# Patient Record
Sex: Male | Born: 1945 | Race: White | Hispanic: No | Smoking: Never smoker
Health system: Southern US, Community
[De-identification: ages and names within clinical notes are randomized; demographics above are authoritative.]

## PROBLEM LIST (undated history)

## (undated) DIAGNOSIS — K5790 Diverticulosis of intestine, part unspecified, without perforation or abscess without bleeding: Secondary | ICD-10-CM

## (undated) DIAGNOSIS — K922 Gastrointestinal hemorrhage, unspecified: Secondary | ICD-10-CM

## (undated) DIAGNOSIS — F329 Major depressive disorder, single episode, unspecified: Secondary | ICD-10-CM

## (undated) DIAGNOSIS — F32A Depression, unspecified: Secondary | ICD-10-CM

## (undated) DIAGNOSIS — J069 Acute upper respiratory infection, unspecified: Secondary | ICD-10-CM

## (undated) DIAGNOSIS — I10 Essential (primary) hypertension: Secondary | ICD-10-CM

## (undated) DIAGNOSIS — M1990 Unspecified osteoarthritis, unspecified site: Secondary | ICD-10-CM

## (undated) DIAGNOSIS — I499 Cardiac arrhythmia, unspecified: Secondary | ICD-10-CM

## (undated) HISTORY — PX: TONSILLECTOMY: SUR1361

---

## 1990-01-01 HISTORY — PX: TREATMENT FISTULA ANAL: SUR1390

## 1990-01-01 HISTORY — PX: SINUS EXPLORATION: SHX5214

## 2008-01-02 DIAGNOSIS — K922 Gastrointestinal hemorrhage, unspecified: Secondary | ICD-10-CM

## 2008-01-02 HISTORY — DX: Gastrointestinal hemorrhage, unspecified: K92.2

## 2009-04-16 ENCOUNTER — Inpatient Hospital Stay (HOSPITAL_COMMUNITY): Admission: EM | Admit: 2009-04-16 | Discharge: 2009-04-19 | Payer: Self-pay | Admitting: Emergency Medicine

## 2010-03-21 LAB — HEPATIC FUNCTION PANEL
ALT: 24 U/L (ref 0–53)
AST: 28 U/L (ref 0–37)
Albumin: 3.7 g/dL (ref 3.5–5.2)
Alkaline Phosphatase: 57 U/L (ref 39–117)
Total Protein: 6 g/dL (ref 6.0–8.3)

## 2010-03-21 LAB — BASIC METABOLIC PANEL
BUN: 21 mg/dL (ref 6–23)
Calcium: 8.9 mg/dL (ref 8.4–10.5)
Chloride: 110 mEq/L (ref 96–112)
Creatinine, Ser: 1.26 mg/dL (ref 0.4–1.5)
GFR calc Af Amer: >60 mL/min (ref >60)
GFR calc non Af Amer: 57 mL/min — ABNORMAL LOW (ref >60)
Glucose, Bld: 103 mg/dL — ABNORMAL HIGH (ref 70–99)
Potassium: 4.1 mEq/L (ref 3.5–5.1)
Potassium: 4.3 mEq/L (ref 3.5–5.1)
Sodium: 140 mEq/L (ref 135–145)
Sodium: 142 mEq/L (ref 135–145)

## 2010-03-21 LAB — DIFFERENTIAL
Basophils Absolute: 0 10*3/uL (ref 0.0–0.1)
Lymphocytes Relative: 24 % (ref 12–46)
Lymphs Abs: 1.7 10*3/uL (ref 0.7–4.0)
Neutro Abs: 4.7 10*3/uL (ref 1.7–7.7)
Neutrophils Relative %: 67 % (ref 43–77)

## 2010-03-21 LAB — POCT I-STAT, CHEM 8
Creatinine, Ser: 1.4 mg/dL (ref 0.4–1.5)
Glucose, Bld: 105 mg/dL — ABNORMAL HIGH (ref 70–99)
HCT: 44 % (ref 39.0–52.0)
Hemoglobin: 15 g/dL (ref 13.0–17.0)
Potassium: 4.1 mEq/L (ref 3.5–5.1)
Sodium: 142 mEq/L (ref 135–145)
TCO2: 25 mmol/L (ref 0–100)

## 2010-03-21 LAB — TYPE AND SCREEN
ABO/RH(D): O POS
Antibody Screen: NEGATIVE

## 2010-03-21 LAB — CBC
HCT: 30.6 % — ABNORMAL LOW (ref 39.0–52.0)
HCT: 33.2 % — ABNORMAL LOW (ref 39.0–52.0)
HCT: 40.8 % (ref 39.0–52.0)
Hemoglobin: 10.7 g/dL — ABNORMAL LOW (ref 13.0–17.0)
Hemoglobin: 10.9 g/dL — ABNORMAL LOW (ref 13.0–17.0)
Hemoglobin: 11.6 g/dL — ABNORMAL LOW (ref 13.0–17.0)
MCHC: 34.8 g/dL (ref 30.0–36.0)
MCHC: 35.1 g/dL (ref 30.0–36.0)
MCV: 94.8 fL (ref 78.0–100.0)
MCV: 94.9 fL (ref 78.0–100.0)
MCV: 95.5 fL (ref 78.0–100.0)
Platelets: 170 10*3/uL (ref 150–400)
RBC: 3.22 MIL/uL — ABNORMAL LOW (ref 4.22–5.81)
RBC: 3.27 MIL/uL — ABNORMAL LOW (ref 4.22–5.81)
RBC: 3.48 MIL/uL — ABNORMAL LOW (ref 4.22–5.81)
RDW: 13.2 % (ref 11.5–15.5)
RDW: 13.2 % (ref 11.5–15.5)
WBC: 4.5 10*3/uL (ref 4.0–10.5)
WBC: 5 10*3/uL (ref 4.0–10.5)
WBC: 7 10*3/uL (ref 4.0–10.5)

## 2010-03-21 LAB — HEMOGLOBIN AND HEMATOCRIT, BLOOD
HCT: 31.2 % — ABNORMAL LOW (ref 39.0–52.0)
Hemoglobin: 10.9 g/dL — ABNORMAL LOW (ref 13.0–17.0)
Hemoglobin: 13.5 g/dL (ref 13.0–17.0)

## 2010-03-21 LAB — APTT: aPTT: 27 seconds (ref 24–37)

## 2010-11-10 ENCOUNTER — Encounter (HOSPITAL_COMMUNITY): Payer: Self-pay

## 2010-11-10 ENCOUNTER — Encounter (HOSPITAL_COMMUNITY): Payer: Self-pay | Admitting: Respiratory Therapy

## 2010-11-10 ENCOUNTER — Encounter (HOSPITAL_COMMUNITY)
Admission: RE | Admit: 2010-11-10 | Discharge: 2010-11-10 | Disposition: A | Discharge status: Home / Self Care | Payer: Medicare Other | Source: Ambulatory Visit | Attending: Anesthesiology | Admitting: Anesthesiology

## 2010-11-10 ENCOUNTER — Encounter (HOSPITAL_COMMUNITY)
Admission: RE | Admit: 2010-11-10 | Discharge: 2010-11-10 | Disposition: A | Discharge status: Home / Self Care | Payer: Medicare Other | Source: Ambulatory Visit | Attending: Orthopaedic Surgery | Admitting: Orthopaedic Surgery

## 2010-11-10 DIAGNOSIS — Z01818 Encounter for other preprocedural examination: Secondary | ICD-10-CM | POA: Insufficient documentation

## 2010-11-10 DIAGNOSIS — Z01812 Encounter for preprocedural laboratory examination: Secondary | ICD-10-CM | POA: Insufficient documentation

## 2010-11-10 HISTORY — DX: Acute upper respiratory infection, unspecified: J06.9

## 2010-11-10 HISTORY — DX: Depression, unspecified: F32.A

## 2010-11-10 HISTORY — DX: Major depressive disorder, single episode, unspecified: F32.9

## 2010-11-10 HISTORY — DX: Gastrointestinal hemorrhage, unspecified: K92.2

## 2010-11-10 HISTORY — DX: Essential (primary) hypertension: I10

## 2010-11-10 HISTORY — DX: Unspecified osteoarthritis, unspecified site: M19.90

## 2010-11-10 LAB — CBC
HCT: 47.5 % (ref 39.0–52.0)
Hemoglobin: 15.7 g/dL (ref 13.0–17.0)
MCH: 30.6 pg (ref 26.0–34.0)
MCHC: 33.1 g/dL (ref 30.0–36.0)
RBC: 5.13 MIL/uL (ref 4.22–5.81)

## 2010-11-10 LAB — BASIC METABOLIC PANEL
BUN: 27 mg/dL — ABNORMAL HIGH (ref 6–23)
Chloride: 106 mEq/L (ref 96–112)
GFR calc Af Amer: 52 mL/min — ABNORMAL LOW (ref >90)
Glucose, Bld: 100 mg/dL — ABNORMAL HIGH (ref 70–99)
Potassium: 5.7 mEq/L — ABNORMAL HIGH (ref 3.5–5.1)
Sodium: 141 mEq/L (ref 135–145)

## 2010-11-10 LAB — TYPE AND SCREEN
ABO/RH(D): O POS
Antibody Screen: NEGATIVE

## 2010-11-10 LAB — PROTIME-INR: INR: 1.03 (ref 0.00–1.49)

## 2010-11-10 NOTE — Progress Notes (Signed)
ekg requested from Dr. Mertha Finders office per pre-admit secretary.

## 2010-11-10 NOTE — Pre-Procedure Instructions (Signed)
[address]  11/10/2010   Your procedure is scheduled on:  11-21-2010  Tuesday  Report to [place] at 5:30 AM.  Call this number if you have problems the morning of surgery: [phone]   Remember:   Do not eat food:After Midnight.  Do not drink clear liquids: 4 Hours before arrival.  Take these medicines the morning of surgery with A SIP OF WATER: oxycodone if needed   Do not wear jewelry, make-up or nail polish.  Do not wear lotions, powders, or perfumes. You may wear deodorant.  Do not shave 48 hours prior to surgery.  Do not bring valuables to the hospital.  Contacts, dentures or bridgework may not be worn into surgery.  Leave suitcase in the car. After surgery it may be brought to your room.  For patients admitted to the hospital, checkout time is 11:00 AM the day of discharge.   Patients discharged the day of surgery will not be allowed to drive home.  Name and phone number of your driver: Harry Foster - brother-in-law [phone]  Special Instructions: [place] and bring it with you on the day of surgery. and CHG Shower Use Special Wash: 1/2 bottle night before surgery and 1/2 bottle morning of surgery.   Please read over the following fact sheets that you were given: Pain Booklet, Coughing and Deep Breathing, Blood Transfusion Information, MRSA Information and Surgical Site Infection Prevention

## 2010-11-16 NOTE — H&P (Signed)
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  He is here about both of his knees in referral through Dr. Marcellus Foster.  He has had years of trouble with his knees and has had many injections in both of them.  He has been on various oral anti-inflammatories and is currently on meloxicam.  He has pain which limits his ability to walk and stand and rest.  He is of the opinion that he needs a knee replacement.  His left side is more painful than the right.  He considered having this done in the past couple of years, but his wife was ill and he was the primary caregiver.  She has passed away earlier this year.  He describes his pain as constant and severe and thinks it is getting worse.  PAST MEDICAL HISTORY:  He is not diabetic and has no drug allergies.  He has no other orthopedic issues.  A 14 system review is performed and is negative except for a history of elevated cholesterol and some stomach issues.  His medications include DHEA, coenzyme Q, meloxicam, lisinopril, and p.r.n. oxycodone maybe 3 pills per day of 15 mg strength.  His medical coverage is through Dr. Marcellus Foster.  Review of systems reviewed thoroughly and all other systems are negative as related to the chief complaint.  FAMILY HISTORY:  Positive for diabetes and positive for arthritis.  SOCIAL HISTORY:  He is a widower and currently lives alone.  He does not smoke.  He is retired from running a Information systems manager.  PHYSICAL EXAM:  Harry Foster is a well-developed, well-nourished man in no apparent distress and oriented x3.  He walks with a bit of an altered gait.  He has motion on both sides from about 0 to 95 with no effusion on either side and no scars.  He has crepitation with medial joint line pain on both sides.  He has good hip motion and sensation and motor function are intact in his heel with palpable pulses on both sides.  No palpable  lymphadenopathy behind either knee.  He has normal extraocular motion equal and reactive pupils.  RADIOGRAPHS:  X-rays were ordered, performed, and interpreted by me today.  We took 4 views of both knees here today.  He has advanced degenerative change in the medial compartment on each side.  ASSESSMENT:  Bilateral end-stage DJD, left side more symptomatic.  PLAN:  Harry Foster is coming to the point where he needs to consider knee replacement surgery.  We have reviewed risks of anesthesia, infection, DVT, PE, and death related to a replacement, which we would likely tackle first on the left.  I have told him he would be in the hospital for about 3 days and then would probably need to go to rehab to stay somewhere as he lives alone currently.  I stressed the importance of the rehabilitation protocol to optimize result.  He would like to have this done some time in November after he gets through Harry Foster season and the grass stops growing.  We will try to accommodate him with surgery for some time in November.  I will write him a letter to go over today's first visit. RE: Harry, Foster

## 2010-11-20 NOTE — H&P (Signed)
H&P reviewed and patient examined.  No significant changes.  Acceptable for surgery.  Harry Foster 

## 2010-11-21 ENCOUNTER — Other Ambulatory Visit: Payer: Self-pay

## 2010-11-21 ENCOUNTER — Encounter (HOSPITAL_COMMUNITY)
Admission: RE | Disposition: A | Discharge status: Home / Self Care | Payer: Self-pay | Source: Ambulatory Visit | Attending: Orthopaedic Surgery

## 2010-11-21 ENCOUNTER — Ambulatory Visit (HOSPITAL_COMMUNITY)
Admission: RE | Admit: 2010-11-21 | Discharge: 2010-11-21 | Disposition: A | Discharge status: Home / Self Care | Payer: Medicare Other | Source: Ambulatory Visit | Attending: Orthopaedic Surgery | Admitting: Orthopaedic Surgery

## 2010-11-21 DIAGNOSIS — I4891 Unspecified atrial fibrillation: Secondary | ICD-10-CM | POA: Insufficient documentation

## 2010-11-21 DIAGNOSIS — M1710 Unilateral primary osteoarthritis, unspecified knee: Secondary | ICD-10-CM | POA: Insufficient documentation

## 2010-11-21 DIAGNOSIS — Z01810 Encounter for preprocedural cardiovascular examination: Secondary | ICD-10-CM | POA: Insufficient documentation

## 2010-11-21 DIAGNOSIS — Z5309 Procedure and treatment not carried out because of other contraindication: Secondary | ICD-10-CM | POA: Insufficient documentation

## 2010-11-21 DIAGNOSIS — I10 Essential (primary) hypertension: Secondary | ICD-10-CM | POA: Insufficient documentation

## 2010-11-21 SURGERY — ARTHROPLASTY, KNEE, TOTAL
Anesthesia: Regional | Laterality: Left

## 2010-11-21 MED ORDER — LACTATED RINGERS IV SOLN: INTRAVENOUS | Status: DC

## 2010-11-21 SURGICAL SUPPLY — 61 items
AUTOTRANSFUSION W/QD PVC DRAIN (AUTOTRANSFUSION) ×2 IMPLANT
BANDAGE ELASTIC 4 VELCRO ST LF (GAUZE/BANDAGES/DRESSINGS) ×2 IMPLANT
BANDAGE ESMARK 6X9 LF (GAUZE/BANDAGES/DRESSINGS) ×1 IMPLANT
BANDAGE GAUZE ELAST BULKY 4 IN (GAUZE/BANDAGES/DRESSINGS) ×4 IMPLANT
BLADE SAGITTAL 25.0X1.19X90 (BLADE) ×2 IMPLANT
BLADE SURG ROTATE 9660 (MISCELLANEOUS) IMPLANT
BNDG CMPR 9X6 STRL LF SNTH (GAUZE/BANDAGES/DRESSINGS) ×1
BNDG CMPR MED 10X6 ELC LF (GAUZE/BANDAGES/DRESSINGS) ×1
BNDG ELASTIC 6X10 VLCR STRL LF (GAUZE/BANDAGES/DRESSINGS) ×2 IMPLANT
BNDG ESMARK 6X9 LF (GAUZE/BANDAGES/DRESSINGS) ×2
BOWL SMART MIX CTS (DISPOSABLE) ×2 IMPLANT
CLOTH BEACON ORANGE TIMEOUT ST (SAFETY) ×2 IMPLANT
COVER SURGICAL LIGHT HANDLE (MISCELLANEOUS) ×2 IMPLANT
CUFF TOURNIQUET SINGLE 34IN LL (TOURNIQUET CUFF) ×2 IMPLANT
CUFF TOURNIQUET SINGLE 44IN (TOURNIQUET CUFF) IMPLANT
DRAPE EXTREMITY T 121X128X90 (DRAPE) ×2 IMPLANT
DRAPE PROXIMA HALF (DRAPES) ×2 IMPLANT
DRAPE U-SHAPE 47X51 STRL (DRAPES) ×2 IMPLANT
DRSG ADAPTIC 3X8 NADH LF (GAUZE/BANDAGES/DRESSINGS) ×2 IMPLANT
DRSG PAD ABDOMINAL 8X10 ST (GAUZE/BANDAGES/DRESSINGS) ×2 IMPLANT
DURAPREP 26ML APPLICATOR (WOUND CARE) ×2 IMPLANT
ELECT REM PT RETURN 9FT ADLT (ELECTROSURGICAL) ×2
ELECTRODE REM PT RTRN 9FT ADLT (ELECTROSURGICAL) ×1 IMPLANT
EVACUATOR 1/8 PVC DRAIN (DRAIN) ×2 IMPLANT
FACESHIELD LNG OPTICON STERILE (SAFETY) ×2 IMPLANT
GLOVE BIO SURGEON STRL SZ8.5 (GLOVE) ×2 IMPLANT
GLOVE BIOGEL PI IND STRL 8 (GLOVE) ×1 IMPLANT
GLOVE BIOGEL PI IND STRL 8.5 (GLOVE) ×1 IMPLANT
GLOVE BIOGEL PI INDICATOR 8 (GLOVE) ×1
GLOVE BIOGEL PI INDICATOR 8.5 (GLOVE) ×1
GLOVE SS BIOGEL STRL SZ 8 (GLOVE) ×1 IMPLANT
GLOVE SUPERSENSE BIOGEL SZ 8 (GLOVE) ×1
GOWN PREVENTION PLUS XLARGE (GOWN DISPOSABLE) ×6 IMPLANT
GOWN STRL NON-REIN LRG LVL3 (GOWN DISPOSABLE) ×4 IMPLANT
GOWN STRL REIN 2XL LVL4 (GOWN DISPOSABLE) ×2 IMPLANT
HANDPIECE INTERPULSE COAX TIP (DISPOSABLE) ×2
HOOD PEEL AWAY FACE SHEILD DIS (HOOD) ×2 IMPLANT
IMMOBILIZER KNEE 20 (SOFTGOODS)
IMMOBILIZER KNEE 20 THIGH 36 (SOFTGOODS) IMPLANT
IMMOBILIZER KNEE 22 UNIV (SOFTGOODS) ×2 IMPLANT
IMMOBILIZER KNEE 24 THIGH 36 (MISCELLANEOUS) IMPLANT
IMMOBILIZER KNEE 24 UNIV (MISCELLANEOUS)
KIT BASIN OR (CUSTOM PROCEDURE TRAY) ×2 IMPLANT
KIT ROOM TURNOVER OR (KITS) ×2 IMPLANT
MANIFOLD NEPTUNE II (INSTRUMENTS) ×2 IMPLANT
NS IRRIG 1000ML POUR BTL (IV SOLUTION) ×2 IMPLANT
PACK TOTAL JOINT (CUSTOM PROCEDURE TRAY) ×2 IMPLANT
PAD ARMBOARD 7.5X6 YLW CONV (MISCELLANEOUS) ×2 IMPLANT
SET HNDPC FAN SPRY TIP SCT (DISPOSABLE) ×1 IMPLANT
SPONGE GAUZE 4X4 12PLY (GAUZE/BANDAGES/DRESSINGS) ×2 IMPLANT
STAPLER VISISTAT 35W (STAPLE) ×2 IMPLANT
SUCTION FRAZIER TIP 10 FR DISP (SUCTIONS) ×2 IMPLANT
SUT VIC AB 0 CT1 27 (SUTURE) ×4
SUT VIC AB 0 CT1 27XBRD ANBCTR (SUTURE) ×2 IMPLANT
SUT VIC AB 1 CTB1 27 (SUTURE) ×4 IMPLANT
SUT VIC AB 2-0 CT1 27 (SUTURE) ×4
SUT VIC AB 2-0 CT1 TAPERPNT 27 (SUTURE) ×2 IMPLANT
TOWEL OR 17X24 6PK STRL BLUE (TOWEL DISPOSABLE) ×2 IMPLANT
TOWEL OR 17X26 10 PK STRL BLUE (TOWEL DISPOSABLE) ×2 IMPLANT
TRAY FOLEY CATH 14FR (SET/KITS/TRAYS/PACK) ×2 IMPLANT
WATER STERILE IRR 1000ML POUR (IV SOLUTION) ×6 IMPLANT

## 2010-11-21 NOTE — Progress Notes (Signed)
Patient noted to be in afib in preop.  Needs preop clearance for this new onset afib.  Family knows Dr Daneen Schick and will have him seen soon.  Will reschedule once that is done.

## 2010-11-21 NOTE — Progress Notes (Signed)
EKG from this am shows atrial fibrilation.  Pt denies history of heart disease other than hypertension.  Does not have a cardiologist.  Old EKG from 6/11 shows sinus rhythm. Dr Albertina Parr notified and he states this needs to be run by Dr Rhona Raider.  Dr Rhona Raider and his PA paged at [phone].

## 2010-11-22 ENCOUNTER — Encounter (HOSPITAL_COMMUNITY): Payer: Self-pay | Admitting: Orthopaedic Surgery

## 2010-11-28 NOTE — Discharge Summary (Signed)
Patient ID: Harry Foster MRN: [id number] DOB/AGE: 65-Oct-1947 65 y.o.  Admit date: 11/21/2010 Discharge date: 11/21/10  Admission Diagnoses:  Active Problems:  Atrial fibrillation   Discharge Diagnoses:  Same  Past Medical History  Diagnosis Date  . GI bleed 2010  . Hypertension   . Recurrent upper respiratory infection (URI)     completing treatment for sinus infection - with abx  . Arthritis     both knees and hands and left elbow  . Depression     treated for depression when wife was very ill    Surgeries: Procedure(s):   Consultants:    Discharged Condition: Improved  Hospital Course: Harry Foster is an 65 y.o. male who was not admitted due to finding of atrial fib. Surgery put off till cardiac eval..  TOTAL KNEE ARTHROPLASTY cancelled.   Patient was given perioperative antibiotics:  Anti-infectives    None       Patient was given sequential compression devices, early ambulation, and chemoprophylaxis to prevent DVT.  Patient benefited maximally from hospital stay and there were no complications.    Recent vital signs: No data found.    Recent laboratory studies: No results found for this basename: WBC:2,HGB:2,HCT:2,PLT:2,NA:2,K:2,CL:2,CO2:2,BUN:2,CREATININE:2,GLUCOSE:2,PT:2,INR:2,CALCIUM,2: in the last 72 hours   Discharge Medications:   Discharge Medication List as of 11/21/2010  9:35 AM    CONTINUE these medications which have NOT CHANGED   Details  amLODipine (NORVASC) 5 MG tablet Take 5 mg by mouth daily.  , Until Discontinued, Historical Med    ascorbic acid (VITAMIN C) 1000 MG tablet Take 1,000 mg by mouth daily.  , Until Discontinued, Historical Med    B Complex-C (B-COMPLEX WITH VITAMIN C) tablet Take 1 tablet by mouth daily.  , Until Discontinued, Historical Med    cholecalciferol (VITAMIN D) 1000 UNITS tablet Take 5,000 Units by mouth daily.  , Until Discontinued, Historical Med    Coenzyme Q10 (CO Q 10 PO) Take 1 tablet by mouth daily.   , Until Discontinued, Historical Med    DHEA 50 MG TABS Take 1 tablet by mouth daily.  , Until Discontinued, Historical Med    Lactobacillus (ACIDOPHILUS) CAPS Take 1 capsule by mouth daily.  , Until Discontinued, Historical Med    lisinopril-hydrochlorothiazide (PRINZIDE,ZESTORETIC) 10-12.5 MG per tablet Take 1 tablet by mouth daily.  , Until Discontinued, Historical Med    Magnesium (MAGNACAPS PO) Take 1 capsule by mouth daily.  , Until Discontinued, Historical Med    meloxicam (MOBIC) 15 MG tablet Take 15 mg by mouth daily.  , Until Discontinued, Historical Med    Multiple Vitamins-Minerals (MULTIVITAMINS THER. W/MINERALS) TABS Take 2 tablets by mouth daily. , Until Discontinued, Historical Med    OLIVE LEAF EXTRACT PO Take 1 tablet by mouth daily.  , Until Discontinued, Historical Med    omega-3 acid ethyl esters (LOVAZA) 1 G capsule Take 2 g by mouth 2 (two) times daily.  , Until Discontinued, Historical Med    !! OVER THE COUNTER MEDICATION Take 1 capsule by mouth daily. Berry extract , Until Discontinued, Historical Med    !! OVER THE COUNTER MEDICATION Take 1 capsule by mouth daily. Vitamin d 5000 units w/ iodine 1013mcg , Until Discontinued, Historical Med    oxyCODONE (ROXICODONE) 15 MG immediate release tablet Take 15 mg by mouth every 8 (eight) hours as needed. For pain , Until Discontinued, Historical Med    Pomegranate, [place] granatum, (POMEGRANATE PO) Take 1 tablet by mouth daily.  , Until  Discontinued, Historical Med    Probiotic Product (PROBIOTIC PO) Take 1 tablet by mouth daily.  , Until Discontinued, Historical Med    Saw Palmetto, Serenoa repens, (SAW PALMETTO PO) Take 2 tablets by mouth daily. , Until Discontinued, Historical Med    triamcinolone (NASACORT) 55 MCG/ACT nasal inhaler Place 1 spray into the nose daily.  , Until Discontinued, Historical Med    VITAMIN E PO Take 1 capsule by mouth daily.  , Until Discontinued, Historical Med    VITAMIN K,  PHYTONADIONE, PO Take 1 tablet by mouth daily.  , Until Discontinued, Historical Med     !! - Potential duplicate medications found. Please discuss with provider.      Diagnostic Studies: Dg Chest 2 View  11/10/2010  *RADIOLOGY REPORT*  Clinical Data: Preop left total knee arthroplasty  CHEST - 2 VIEW  Comparison: None.  Findings: Lungs are essentially clear. No pleural effusion or pneumothorax.  The heart is top normal in size.  Degenerative changes of the visualized thoracolumbar spine.  Old left rib fracture deformities.  IMPRESSION: No evidence of acute cardiopulmonary disease.  Original Report Authenticated By: Deskin Hy, M.D.    Disposition: Home or Self Care  Pt did not have surgery and was sent home due to atrial fib.  He will see a cardiologist soon  Discharge Orders    Future Orders Please Complete By Expires   Increase activity slowly      Discharge instructions      Comments:   Need cardiology appointment with Dr Tamala Repinski         Signed: Roselee Nova R 11/28/2010, 12:49 PM

## 2011-01-19 ENCOUNTER — Encounter (HOSPITAL_COMMUNITY): Payer: Self-pay | Admitting: Pharmacy Technician

## 2011-01-30 ENCOUNTER — Other Ambulatory Visit: Payer: Self-pay | Admitting: Interventional Cardiology

## 2011-01-30 DIAGNOSIS — I4819 Other persistent atrial fibrillation: Secondary | ICD-10-CM

## 2011-01-31 ENCOUNTER — Other Ambulatory Visit: Payer: Self-pay | Admitting: Interventional Cardiology

## 2011-01-31 DIAGNOSIS — I4819 Other persistent atrial fibrillation: Secondary | ICD-10-CM | POA: Insufficient documentation

## 2011-01-31 NOTE — Progress Notes (Signed)
Addended by: Daneen Schick III on: 01/31/2011 01:03 PM   Modules accepted: Orders

## 2011-02-01 ENCOUNTER — Other Ambulatory Visit: Payer: Self-pay

## 2011-02-01 ENCOUNTER — Ambulatory Visit (HOSPITAL_COMMUNITY): Payer: Medicare Other | Admitting: Anesthesiology

## 2011-02-01 ENCOUNTER — Encounter (HOSPITAL_COMMUNITY)
Admission: RE | Disposition: A | Discharge status: Home / Self Care | Payer: Self-pay | Source: Ambulatory Visit | Attending: Interventional Cardiology

## 2011-02-01 ENCOUNTER — Encounter (HOSPITAL_COMMUNITY): Payer: Self-pay | Admitting: Anesthesiology

## 2011-02-01 ENCOUNTER — Ambulatory Visit (HOSPITAL_COMMUNITY)
Admission: RE | Admit: 2011-02-01 | Discharge: 2011-02-01 | Disposition: A | Discharge status: Home / Self Care | Payer: Medicare Other | Source: Ambulatory Visit | Attending: Interventional Cardiology | Admitting: Interventional Cardiology

## 2011-02-01 DIAGNOSIS — I4891 Unspecified atrial fibrillation: Secondary | ICD-10-CM | POA: Insufficient documentation

## 2011-02-01 HISTORY — PX: CARDIOVERSION: SHX1299

## 2011-02-01 SURGERY — CARDIOVERSION
Anesthesia: General | Wound class: Clean

## 2011-02-01 MED ORDER — HYDROCORTISONE 1 % EX CREA
1.0000 "application " | TOPICAL_CREAM | Freq: Three times a day (TID) | CUTANEOUS | Status: DC | PRN
  Filled 2011-02-01 (×2): qty 28

## 2011-02-01 MED ORDER — SODIUM CHLORIDE 0.9 % IV SOLN
250.0000 mL | INTRAVENOUS | Status: DC
  Administered 2011-02-01: 1000 mL via INTRAVENOUS

## 2011-02-01 MED ORDER — PROPOFOL 10 MG/ML IV EMUL
INTRAVENOUS | Status: DC | PRN
  Administered 2011-02-01: 50 mg via INTRAVENOUS

## 2011-02-01 MED ORDER — SODIUM CHLORIDE 0.9 % IJ SOLN: 3.0000 mL | Freq: Two times a day (BID) | INTRAMUSCULAR | Status: DC

## 2011-02-01 MED ORDER — SODIUM CHLORIDE 0.9 % IJ SOLN: 3.0000 mL | INTRAMUSCULAR | Status: DC | PRN

## 2011-02-01 NOTE — Op Note (Signed)
Electrical Cardioversion Procedure Note Harry Foster [id number] 1945-04-22  Procedure: Electrical Cardioversion Indications:  Atrial Fibrillation with decreased LVEF due to tachycardia.  Time Out: Verified patient identification, verified procedure,medications/allergies/relevent history reviewed, required imaging and test results available.  Performed  Procedure Details  The patient was NPO after midnight. Anesthesia was administered at the beside  by Dr.Hodierne with 80mg  of propofol.  Cardioversion was done with synchronized biphasic defibrillation with AP pads with 200 watts.  The patient converted to normal sinus rhythm. The patient tolerated the procedure well   IMPRESSION:  Successful cardioversion of atrial fibrillation    Harry Foster 02/01/2011, 12:14 PM

## 2011-02-01 NOTE — H&P (Signed)
We re-discussed the treatment plan and indication. We re-discussed the procedure. He again gave informed consent. No change in status since OV 01/30/11.

## 2011-02-01 NOTE — Anesthesia Postprocedure Evaluation (Signed)
  Anesthesia Post-op Note  Patient: Harry Foster  Procedure(s) Performed:  CARDIOVERSION  Patient Location: PACU  Anesthesia Type: MAC  Level of Consciousness: awake, alert  and oriented  Airway and Oxygen Therapy: Patient Spontanous Breathing  Post-op Pain: none  Post-op Assessment: Post-op Vital signs reviewed, Patient's Cardiovascular Status Stable, Respiratory Function Stable, Patent Airway, No signs of Nausea or vomiting and Adequate PO intake  Post-op Vital Signs: Reviewed and stable  Complications: No apparent anesthesia complications

## 2011-02-01 NOTE — Anesthesia Preprocedure Evaluation (Addendum)
Anesthesia Evaluation  Patient identified by MRN, date of birth, ID band Patient awake    Reviewed: Allergy & Precautions, H&P , NPO status , Patient's Chart, lab work & pertinent test results, reviewed documented beta blocker date and time   History of Anesthesia Complications Negative for: history of anesthetic complications  Airway       Dental   Pulmonary shortness of breath,          Cardiovascular hypertension, Pt. on home beta blockers + dysrhythmias Atrial Fibrillation     Neuro/Psych Depression    GI/Hepatic negative GI ROS, Neg liver ROS,   Endo/Other  Morbid obesity  Renal/GU negative Renal ROS  Genitourinary negative   Musculoskeletal negative musculoskeletal ROS (+)   Abdominal   Peds  Hematology negative hematology ROS (+)   Anesthesia Other Findings   Reproductive/Obstetrics negative OB ROS                           Anesthesia Physical Anesthesia Plan  ASA: III  Anesthesia Plan:    Post-op Pain Management:    Induction:   Airway Management Planned:   Additional Equipment:   Intra-op Plan:   Post-operative Plan:   Informed Consent:   Plan Discussed with:   Anesthesia Plan Comments:         Anesthesia Quick Evaluation

## 2011-02-01 NOTE — Preoperative (Signed)
Beta Blockers   Reason not to administer Beta Blockers:Not Applicable 

## 2011-02-01 NOTE — Transfer of Care (Signed)
Immediate Anesthesia Transfer of Care Note  Patient: Harry Foster  Procedure(s) Performed:  CARDIOVERSION  Patient Location: PACU  Anesthesia Type: MAC  Level of Consciousness: awake, alert  and oriented  Airway & Oxygen Therapy: Patient Spontanous Breathing and Patient connected to nasal cannula oxygen  Post-op Assessment: Report given to PACU RN, Post -op Vital signs reviewed and stable and Patient moving all extremities  Post vital signs: Reviewed and stable  Complications: No apparent anesthesia complications

## 2011-02-02 ENCOUNTER — Encounter (HOSPITAL_COMMUNITY): Payer: Self-pay | Admitting: Interventional Cardiology

## 2011-09-20 ENCOUNTER — Other Ambulatory Visit: Payer: Self-pay | Admitting: Orthopaedic Surgery

## 2011-09-24 ENCOUNTER — Encounter (HOSPITAL_COMMUNITY): Payer: Self-pay | Admitting: Pharmacy Technician

## 2011-10-03 ENCOUNTER — Encounter (HOSPITAL_COMMUNITY)
Admission: RE | Admit: 2011-10-03 | Discharge: 2011-10-03 | Disposition: A | Discharge status: Home / Self Care | Payer: Medicare Other | Source: Ambulatory Visit | Attending: Orthopaedic Surgery | Admitting: Orthopaedic Surgery

## 2011-10-03 ENCOUNTER — Encounter (HOSPITAL_COMMUNITY): Payer: Self-pay

## 2011-10-03 HISTORY — DX: Diverticulosis of intestine, part unspecified, without perforation or abscess without bleeding: K57.90

## 2011-10-03 HISTORY — DX: Cardiac arrhythmia, unspecified: I49.9

## 2011-10-03 LAB — PROTIME-INR: INR: 2.05 — ABNORMAL HIGH (ref 0.00–1.49)

## 2011-10-03 LAB — CBC WITH DIFFERENTIAL/PLATELET
Basophils Relative: 1 % (ref 0–1)
HCT: 45.2 % (ref 39.0–52.0)
Hemoglobin: 14.4 g/dL (ref 13.0–17.0)
Lymphocytes Relative: 34 % (ref 12–46)
Lymphs Abs: 1.8 10*3/uL (ref 0.7–4.0)
MCHC: 31.9 g/dL (ref 30.0–36.0)
Monocytes Relative: 7 % (ref 3–12)
Neutro Abs: 3 10*3/uL (ref 1.7–7.7)
Neutrophils Relative %: 56 % (ref 43–77)
RBC: 4.82 MIL/uL (ref 4.22–5.81)
WBC: 5.4 10*3/uL (ref 4.0–10.5)

## 2011-10-03 LAB — BASIC METABOLIC PANEL
Chloride: 105 mEq/L (ref 96–112)
GFR calc Af Amer: 51 mL/min — ABNORMAL LOW (ref >90)
GFR calc non Af Amer: 44 mL/min — ABNORMAL LOW (ref >90)
Potassium: 4.5 mEq/L (ref 3.5–5.1)
Sodium: 140 mEq/L (ref 135–145)

## 2011-10-03 LAB — SURGICAL PCR SCREEN: MRSA, PCR: NEGATIVE

## 2011-10-03 LAB — URINALYSIS, ROUTINE W REFLEX MICROSCOPIC
Ketones, ur: NEGATIVE mg/dL
Leukocytes, UA: NEGATIVE
Nitrite: NEGATIVE
Protein, ur: NEGATIVE mg/dL
pH: 5 (ref 5.0–8.0)

## 2011-10-03 LAB — APTT: aPTT: 35 seconds (ref 24–37)

## 2011-10-03 NOTE — Progress Notes (Addendum)
Requested cardiac records from Dr. Linard Millers at [facility]. Pt says he had cardiac clearance which was faxed to Dr. Jerald Kief office.   Pt requests T&S DOS, unwilling to wear armband for entire week prior to surgery.   Chart left for anesthesia review of labs, cardiac records.

## 2011-10-03 NOTE — Pre-Procedure Instructions (Signed)
[address]  10/03/2011   Your procedure is scheduled on:  Tuesday October 8  Report to [place] at 5:30 AM.  Call this number if you have problems the morning of surgery: [phone]   Remember:   Do not eat or drink:After Midnight.    Take these medicines the morning of surgery with A SIP OF WATER: Amiodarone (Pacerone), metoprolol (Toprol). Oxycodone (Roxicodone) if needed.    Do not wear jewelry, make-up or nail polish.  Do not wear lotions, powders, or perfumes. You may wear deodorant.  Do not shave 48 hours prior to surgery. Men may shave face and neck.  Do not bring valuables to the hospital.  Contacts, dentures or bridgework may not be worn into surgery.  Leave suitcase in the car. After surgery it may be brought to your room.  For patients admitted to the hospital, checkout time is 11:00 AM the day of discharge.   Patients discharged the day of surgery will not be allowed to drive home.  Name and phone number of your driver: NA  Special Instructions: Incentive Spirometry - Practice and bring it with you on the day of surgery. Shower using CHG 2 nights before surgery and the night before surgery.  If you shower the day of surgery use CHG.  Use special wash - you have one bottle of CHG for all showers.  You should use approximately 1/3 of the bottle for each shower.   Please read over the following fact sheets that you were given: Pain Booklet, Coughing and Deep Breathing, Blood Transfusion Information, Total Joint Packet and Surgical Site Infection Prevention

## 2011-10-04 NOTE — Consult Note (Signed)
Anesthesia chart review: Patient is a 66 year old male scheduled for left knee arthroplasty by Dr. Asa Saunas on 10/09/2011.  He was initially scheduled for 02/01/11, but was cancelled due to new onset a-fib.  History includes a-fib s/p cardioversion 02/01/11, obesity, non-smoker, depression, HTN, GI bleed '10, CKD, diverticulosis, anal fistula.  PCP is Dr. Josetta Huddle.    Cardiologist is Dr. Daneen Schick [facility]).  He saw patient for a pre-operative evaluation on 08/27/11 and cleared him for this procedure.  EKG at [facility] on 02/07/11 showed SB @ 44 bpm.  Echo on 11/27/10 [facility]) showed mildly reduced LV function with EF 40-45%, moderate LVH, mild left atrial enlargement, moderate tricuspid regurgitation, mildly elevated estimated RV systolic pressure, mild mitral valve regurgitation, diastolic function difficult to evaluate in light of underlying atrial fibrillation, borderline right atrial enlargement.  CXR on 11/10/10 showed no evidence of acute cardiopulmonary disease.  Labs noted.  BUN/Cr 24/1.59 (stable since 11/10/10).  He will need a repeat PT/INR and T&S on arrival.  Myra Gianotti, PA-C

## 2011-10-05 NOTE — H&P (Signed)
Harry Foster is an 66 y.o. male.   Chief Complaint: left knee pain HPI: Harry Foster is a patient well-known to our practice who is complaining of increasing left knee pain with activities.  At this point he is having constant severe pain which limits his activity level.  He is also having trouble sleeping at nighttime.  In the past she had been on anti-inflammatory medicines, he has done home physical therapy, and has tried injection with minimal to no benefit.he is currently on oxycodone to control his pain.  We had tried to get his knee replaced sometime ago but he went into atrial fibrillation so this had to be delayed until now when he is stable.  X-rays: Left knee multiple views AP lateral and tunnel show significant joint space narrowing and osteophyte formation.  We have discussed proceeding with a left knee replacement to improve function and decrease pain for this patient  Past Medical History  Diagnosis Date  . GI bleed 2010  . Hypertension   . Recurrent upper respiratory infection (URI)     completing treatment for sinus infection - with abx  . Arthritis     both knees and hands and left elbow  . Depression     treated for depression when wife was very ill  . GI bleed     hx  . Diverticulosis     hx - no sx  . Dysrhythmia     hx A fib, on anticoagulants    Past Surgical History  Procedure Date  . Sinus exploration 1992    laser sinus surgery  . Treatment fistula anal 1992  . Tonsillectomy     age 53  . No past surgeries 11-09-2010    tooth extracted by dentist under local anesthesia  . Total knee arthroplasty 11/21/2010    Procedure: TOTAL KNEE ARTHROPLASTY;  Surgeon: Hessie Dibble, MD;  Location: [place];  Service: Orthopedics;  Laterality: Left;  . Cardioversion 02/01/2011    Procedure: CARDIOVERSION;  Surgeon: Sinclair Grooms, MD;  Location: [place];  Service: Cardiovascular;  Laterality: N/A;    No family history on file. Social History:  reports that he has never  smoked. He does not have any smokeless tobacco history on file. He reports that he does not drink alcohol or use illicit drugs.  Allergies:  Allergies  Allergen Reactions  . Lisinopril Cough    No prescriptions prior to admission    Results for orders placed during the hospital encounter of 10/03/11 (from the past 48 hour(s))  SURGICAL PCR SCREEN     Status: Normal   Collection Time   10/03/11  2:23 PM      Component Value Range Comment   MRSA, PCR NEGATIVE  NEGATIVE    Staphylococcus aureus NEGATIVE  NEGATIVE   URINALYSIS, ROUTINE W REFLEX MICROSCOPIC     Status: Normal   Collection Time   10/03/11  2:23 PM      Component Value Range Comment   Color, Urine YELLOW  YELLOW    APPearance CLEAR  CLEAR    Specific Gravity, Urine 1.027  1.005 - 1.030    pH 5.0  5.0 - 8.0    Glucose, UA NEGATIVE  NEGATIVE mg/dL    Hgb urine dipstick NEGATIVE  NEGATIVE    Bilirubin Urine NEGATIVE  NEGATIVE    Ketones, ur NEGATIVE  NEGATIVE mg/dL    Protein, ur NEGATIVE  NEGATIVE mg/dL    Urobilinogen, UA 0.2  0.0 - 1.0  mg/dL    Nitrite NEGATIVE  NEGATIVE    Leukocytes, UA NEGATIVE  NEGATIVE MICROSCOPIC NOT DONE ON URINES WITH NEGATIVE PROTEIN, BLOOD, LEUKOCYTES, NITRITE, OR GLUCOSE <1000 mg/dL.  APTT     Status: Normal   Collection Time   10/03/11  2:24 PM      Component Value Range Comment   aPTT 35  24 - 37 seconds   BASIC METABOLIC PANEL     Status: Abnormal   Collection Time   10/03/11  2:24 PM      Component Value Range Comment   Sodium 140  135 - 145 mEq/L    Potassium 4.5  3.5 - 5.1 mEq/L    Chloride 105  96 - 112 mEq/L    CO2 26  19 - 32 mEq/L    Glucose, Bld 98  70 - 99 mg/dL    BUN 24 (*) 6 - 23 mg/dL    Creatinine, Ser 1.59 (*) 0.50 - 1.35 mg/dL    Calcium 9.7  8.4 - 10.5 mg/dL    GFR calc non Af Amer 44 (*) >90 mL/min    GFR calc Af Amer 51 (*) >90 mL/min   CBC WITH DIFFERENTIAL     Status: Normal   Collection Time   10/03/11  2:24 PM      Component Value Range Comment   WBC  5.4  4.0 - 10.5 K/uL    RBC 4.82  4.22 - 5.81 MIL/uL    Hemoglobin 14.4  13.0 - 17.0 g/dL    HCT 45.2  39.0 - 52.0 %    MCV 93.8  78.0 - 100.0 fL    MCH 29.9  26.0 - 34.0 pg    MCHC 31.9  30.0 - 36.0 g/dL    RDW 12.9  11.5 - 15.5 %    Platelets 182  150 - 400 K/uL    Neutrophils Relative 56  43 - 77 %    Neutro Abs 3.0  1.7 - 7.7 K/uL    Lymphocytes Relative 34  12 - 46 %    Lymphs Abs 1.8  0.7 - 4.0 K/uL    Monocytes Relative 7  3 - 12 %    Monocytes Absolute 0.4  0.1 - 1.0 K/uL    Eosinophils Relative 2  0 - 5 %    Eosinophils Absolute 0.1  0.0 - 0.7 K/uL    Basophils Relative 1  0 - 1 %    Basophils Absolute 0.0  0.0 - 0.1 K/uL   PROTIME-INR     Status: Abnormal   Collection Time   10/03/11  2:24 PM      Component Value Range Comment   Prothrombin Time 22.3 (*) 11.6 - 15.2 seconds    INR 2.05 (*) 0.00 - 1.49    No results found.  Review of Systems  Constitutional: Negative.   HENT: Negative.   Eyes: Negative.   Respiratory: Negative.   Cardiovascular: Positive for palpitations.  Gastrointestinal: Negative.   Genitourinary: Negative.   Musculoskeletal: Positive for joint pain.  Skin: Negative.   Neurological: Negative.   Endo/Heme/Allergies: Negative.   Psychiatric/Behavioral: Negative.     There were no vitals taken for this visit. Physical Exam  Constitutional: He appears well-nourished.  HENT:  Head: Atraumatic.  Eyes: Conjunctivae normal are normal.  Neck: Neck supple.  Cardiovascular: Regular rhythm.   Respiratory: Breath sounds normal.  GI: Bowel sounds are normal.  Musculoskeletal:       Left knee range of  motion 0-90 with no knee effusion.  He does have medial and lateral joint line pain to palpation.  There is also notable crepitation with range of motion.  Neurological: He has normal reflexes.  Skin: Skin is dry.  Psychiatric: He has a normal mood and affect.     Assessment/Plan Assessment: Left knee severe DJD Plan: We will proceed with a left  total knee replacement for area to decrease his pain and improve his function.  We have discussed the risks of anesthesia, infection, DVT and death associated with this type procedure.  Postoperatively we will use Lovenox and Coumadin for anti-coagulation.  Deshan Hemmelgarn R 10/05/2011, 12:51 PM

## 2011-10-08 MED ORDER — CEFAZOLIN SODIUM 10 G IJ SOLR
3.0000 g | INTRAMUSCULAR | Status: AC
  Administered 2011-10-09: 3 g via INTRAVENOUS
  Filled 2011-10-08: qty 3000

## 2011-10-08 MED ORDER — LACTATED RINGERS IV SOLN: INTRAVENOUS | Status: DC

## 2011-10-08 MED ORDER — CHLORHEXIDINE GLUCONATE 4 % EX LIQD: 60.0000 mL | Freq: Once | CUTANEOUS | Status: DC

## 2011-10-08 MED ORDER — CEFAZOLIN SODIUM-DEXTROSE 2-3 GM-% IV SOLR: 2.0000 g | INTRAVENOUS | Status: DC

## 2011-10-09 ENCOUNTER — Ambulatory Visit (HOSPITAL_COMMUNITY): Payer: Medicare Other | Admitting: Vascular Surgery

## 2011-10-09 ENCOUNTER — Encounter (HOSPITAL_COMMUNITY)
Admission: RE | Disposition: A | Discharge status: Skilled Nursing Facility | Payer: Self-pay | Source: Ambulatory Visit | Attending: Orthopaedic Surgery

## 2011-10-09 ENCOUNTER — Encounter (HOSPITAL_COMMUNITY): Payer: Self-pay | Admitting: Vascular Surgery

## 2011-10-09 ENCOUNTER — Inpatient Hospital Stay (HOSPITAL_COMMUNITY)
Admission: RE | Admit: 2011-10-09 | Discharge: 2011-10-11 | DRG: 470 | Disposition: A | Discharge status: Skilled Nursing Facility | Payer: Medicare Other | Source: Ambulatory Visit | Attending: Orthopaedic Surgery | Admitting: Orthopaedic Surgery

## 2011-10-09 ENCOUNTER — Encounter (HOSPITAL_COMMUNITY): Payer: Self-pay | Admitting: *Deleted

## 2011-10-09 DIAGNOSIS — F3289 Other specified depressive episodes: Secondary | ICD-10-CM | POA: Diagnosis present

## 2011-10-09 DIAGNOSIS — F329 Major depressive disorder, single episode, unspecified: Secondary | ICD-10-CM | POA: Diagnosis present

## 2011-10-09 DIAGNOSIS — K5730 Diverticulosis of large intestine without perforation or abscess without bleeding: Secondary | ICD-10-CM | POA: Diagnosis present

## 2011-10-09 DIAGNOSIS — M1712 Unilateral primary osteoarthritis, left knee: Secondary | ICD-10-CM | POA: Diagnosis present

## 2011-10-09 DIAGNOSIS — Z7901 Long term (current) use of anticoagulants: Secondary | ICD-10-CM

## 2011-10-09 DIAGNOSIS — I10 Essential (primary) hypertension: Secondary | ICD-10-CM | POA: Diagnosis present

## 2011-10-09 DIAGNOSIS — Z79899 Other long term (current) drug therapy: Secondary | ICD-10-CM

## 2011-10-09 DIAGNOSIS — I4891 Unspecified atrial fibrillation: Secondary | ICD-10-CM | POA: Diagnosis present

## 2011-10-09 DIAGNOSIS — M1710 Unilateral primary osteoarthritis, unspecified knee: Principal | ICD-10-CM | POA: Diagnosis present

## 2011-10-09 DIAGNOSIS — Z01812 Encounter for preprocedural laboratory examination: Secondary | ICD-10-CM

## 2011-10-09 HISTORY — PX: TOTAL KNEE ARTHROPLASTY: SHX125

## 2011-10-09 LAB — TYPE AND SCREEN: ABO/RH(D): O POS

## 2011-10-09 SURGERY — ARTHROPLASTY, KNEE, TOTAL
Anesthesia: General | Site: Knee | Laterality: Left | Wound class: Clean

## 2011-10-09 MED ORDER — HYDROCHLOROTHIAZIDE 12.5 MG PO CAPS
12.5000 mg | ORAL_CAPSULE | Freq: Every day | ORAL | Status: DC
  Administered 2011-10-09 – 2011-10-11 (×3): 12.5 mg via ORAL
  Filled 2011-10-09 (×3): qty 1

## 2011-10-09 MED ORDER — METOCLOPRAMIDE HCL 5 MG PO TABS
5.0000 mg | ORAL_TABLET | Freq: Three times a day (TID) | ORAL | Status: DC | PRN
  Filled 2011-10-09: qty 2

## 2011-10-09 MED ORDER — CEFUROXIME SODIUM 1.5 G IJ SOLR
INTRAMUSCULAR | Status: AC
  Filled 2011-10-09: qty 1.5

## 2011-10-09 MED ORDER — ALUM & MAG HYDROXIDE-SIMETH 200-200-20 MG/5ML PO SUSP: 30.0000 mL | ORAL | Status: DC | PRN

## 2011-10-09 MED ORDER — MIDAZOLAM HCL 5 MG/5ML IJ SOLN
INTRAMUSCULAR | Status: DC | PRN
  Administered 2011-10-09: 2 mg via INTRAVENOUS

## 2011-10-09 MED ORDER — ACIDOPHILUS/BIFIDUS 100 MG PO WAFR: WAFER | Freq: Every morning | ORAL | Status: DC

## 2011-10-09 MED ORDER — FENTANYL CITRATE 0.05 MG/ML IJ SOLN
INTRAMUSCULAR | Status: DC | PRN
  Administered 2011-10-09 (×4): 50 ug via INTRAVENOUS
  Administered 2011-10-09 (×2): 100 ug via INTRAVENOUS
  Administered 2011-10-09 (×4): 50 ug via INTRAVENOUS

## 2011-10-09 MED ORDER — CEFUROXIME SODIUM 1.5 G IJ SOLR
INTRAMUSCULAR | Status: DC | PRN
  Administered 2011-10-09: 1.5 g

## 2011-10-09 MED ORDER — POLYETHYLENE GLYCOL 3350 17 G PO PACK: 17.0000 g | PACK | Freq: Every day | ORAL | Status: DC | PRN

## 2011-10-09 MED ORDER — EPHEDRINE SULFATE 50 MG/ML IJ SOLN
INTRAMUSCULAR | Status: DC | PRN
  Administered 2011-10-09 (×4): 5 mg via INTRAVENOUS

## 2011-10-09 MED ORDER — MAGNESIUM GLUCONATE 500 MG PO TABS
500.0000 mg | ORAL_TABLET | Freq: Every day | ORAL | Status: DC
  Administered 2011-10-09 – 2011-10-11 (×3): 500 mg via ORAL
  Filled 2011-10-09 (×4): qty 1

## 2011-10-09 MED ORDER — DIPHENHYDRAMINE HCL 12.5 MG/5ML PO ELIX: 12.5000 mg | ORAL_SOLUTION | ORAL | Status: DC | PRN

## 2011-10-09 MED ORDER — RISAQUAD PO CAPS
1.0000 | ORAL_CAPSULE | Freq: Every day | ORAL | Status: DC
  Administered 2011-10-09 – 2011-10-11 (×3): 1 via ORAL
  Filled 2011-10-09 (×4): qty 1

## 2011-10-09 MED ORDER — OXYCODONE HCL 5 MG/5ML PO SOLN: 5.0000 mg | Freq: Once | ORAL | Status: DC | PRN

## 2011-10-09 MED ORDER — OXYCODONE HCL 5 MG PO TABS
5.0000 mg | ORAL_TABLET | ORAL | Status: DC | PRN
  Administered 2011-10-09 – 2011-10-11 (×4): 10 mg via ORAL
  Filled 2011-10-09 (×3): qty 2

## 2011-10-09 MED ORDER — METOPROLOL SUCCINATE ER 25 MG PO TB24
25.0000 mg | ORAL_TABLET | Freq: Two times a day (BID) | ORAL | Status: DC
  Administered 2011-10-09 – 2011-10-11 (×4): 25 mg via ORAL
  Filled 2011-10-09 (×7): qty 1

## 2011-10-09 MED ORDER — LIDOCAINE HCL (CARDIAC) 20 MG/ML IV SOLN
INTRAVENOUS | Status: DC | PRN
  Administered 2011-10-09: 80 mg via INTRAVENOUS

## 2011-10-09 MED ORDER — SODIUM CHLORIDE 0.9 % IR SOLN
Status: DC | PRN
  Administered 2011-10-09: 3000 mL
  Administered 2011-10-09: 1000 mL

## 2011-10-09 MED ORDER — CEFAZOLIN SODIUM-DEXTROSE 2-3 GM-% IV SOLR
2.0000 g | Freq: Four times a day (QID) | INTRAVENOUS | Status: AC
  Administered 2011-10-09 (×2): 2 g via INTRAVENOUS
  Filled 2011-10-09 (×2): qty 50

## 2011-10-09 MED ORDER — COUMADIN BOOK
Freq: Once | Status: AC
  Administered 2011-10-09: 18:00:00
  Filled 2011-10-09 (×2): qty 1

## 2011-10-09 MED ORDER — FERROUS SULFATE 325 (65 FE) MG PO TABS
325.0000 mg | ORAL_TABLET | Freq: Every day | ORAL | Status: DC
  Administered 2011-10-10 – 2011-10-11 (×2): 325 mg via ORAL
  Filled 2011-10-09 (×3): qty 1

## 2011-10-09 MED ORDER — LACTATED RINGERS IV SOLN
INTRAVENOUS | Status: DC
  Administered 2011-10-10: 01:00:00 via INTRAVENOUS

## 2011-10-09 MED ORDER — DOCUSATE SODIUM 100 MG PO CAPS
100.0000 mg | ORAL_CAPSULE | Freq: Two times a day (BID) | ORAL | Status: DC
  Administered 2011-10-09 – 2011-10-11 (×4): 100 mg via ORAL
  Filled 2011-10-09 (×4): qty 1

## 2011-10-09 MED ORDER — ACETAMINOPHEN 325 MG PO TABS: 650.0000 mg | ORAL_TABLET | Freq: Four times a day (QID) | ORAL | Status: DC | PRN

## 2011-10-09 MED ORDER — HYDROMORPHONE HCL PF 1 MG/ML IJ SOLN
0.5000 mg | INTRAMUSCULAR | Status: DC | PRN
  Administered 2011-10-09 – 2011-10-10 (×8): 1 mg via INTRAVENOUS
  Filled 2011-10-09 (×9): qty 1

## 2011-10-09 MED ORDER — METHOCARBAMOL 500 MG PO TABS
500.0000 mg | ORAL_TABLET | Freq: Four times a day (QID) | ORAL | Status: DC | PRN
  Administered 2011-10-09 – 2011-10-10 (×3): 500 mg via ORAL
  Filled 2011-10-09 (×2): qty 1

## 2011-10-09 MED ORDER — WARFARIN SODIUM 1 MG PO TABS
11.0000 mg | ORAL_TABLET | Freq: Once | ORAL | Status: AC
  Administered 2011-10-09: 11 mg via ORAL
  Filled 2011-10-09 (×2): qty 1

## 2011-10-09 MED ORDER — ONDANSETRON HCL 4 MG PO TABS: 4.0000 mg | ORAL_TABLET | Freq: Four times a day (QID) | ORAL | Status: DC | PRN

## 2011-10-09 MED ORDER — PHENOL 1.4 % MT LIQD: 1.0000 | OROMUCOSAL | Status: DC | PRN

## 2011-10-09 MED ORDER — MENTHOL 3 MG MT LOZG: 1.0000 | LOZENGE | OROMUCOSAL | Status: DC | PRN

## 2011-10-09 MED ORDER — HYDROMORPHONE HCL PF 1 MG/ML IJ SOLN
0.2500 mg | INTRAMUSCULAR | Status: DC | PRN
  Administered 2011-10-09: 0.5 mg via INTRAVENOUS

## 2011-10-09 MED ORDER — METHOCARBAMOL 500 MG PO TABS
ORAL_TABLET | ORAL | Status: AC
  Filled 2011-10-09: qty 1

## 2011-10-09 MED ORDER — ACETAMINOPHEN 650 MG RE SUPP: 650.0000 mg | Freq: Four times a day (QID) | RECTAL | Status: DC | PRN

## 2011-10-09 MED ORDER — ONDANSETRON HCL 4 MG/2ML IJ SOLN
INTRAMUSCULAR | Status: DC | PRN
  Administered 2011-10-09: 4 mg via INTRAVENOUS

## 2011-10-09 MED ORDER — OXYCODONE HCL 5 MG PO TABS: 5.0000 mg | ORAL_TABLET | Freq: Once | ORAL | Status: DC | PRN

## 2011-10-09 MED ORDER — WARFARIN - PHARMACIST DOSING INPATIENT: Freq: Every day | Status: DC

## 2011-10-09 MED ORDER — ENOXAPARIN SODIUM 30 MG/0.3ML ~~LOC~~ SOLN
30.0000 mg | Freq: Two times a day (BID) | SUBCUTANEOUS | Status: DC
  Administered 2011-10-09 – 2011-10-11 (×4): 30 mg via SUBCUTANEOUS
  Filled 2011-10-09 (×6): qty 0.3

## 2011-10-09 MED ORDER — IRBESARTAN 300 MG PO TABS
300.0000 mg | ORAL_TABLET | Freq: Every day | ORAL | Status: DC
  Administered 2011-10-09 – 2011-10-11 (×3): 300 mg via ORAL
  Filled 2011-10-09 (×3): qty 1

## 2011-10-09 MED ORDER — MAGNESIUM CITRATE PO SOLN
1.0000 | Freq: Once | ORAL | Status: AC | PRN
  Filled 2011-10-09: qty 296

## 2011-10-09 MED ORDER — OXYCODONE HCL 5 MG PO TABS
ORAL_TABLET | ORAL | Status: AC
  Filled 2011-10-09: qty 2

## 2011-10-09 MED ORDER — HYDROMORPHONE HCL PF 1 MG/ML IJ SOLN
INTRAMUSCULAR | Status: AC
  Filled 2011-10-09: qty 1

## 2011-10-09 MED ORDER — METHOCARBAMOL 100 MG/ML IJ SOLN: 500.0000 mg | Freq: Four times a day (QID) | INTRAVENOUS | Status: DC | PRN

## 2011-10-09 MED ORDER — MEPERIDINE HCL 25 MG/ML IJ SOLN: 6.2500 mg | INTRAMUSCULAR | Status: DC | PRN

## 2011-10-09 MED ORDER — LACTATED RINGERS IV SOLN
INTRAVENOUS | Status: DC | PRN
  Administered 2011-10-09 (×2): via INTRAVENOUS

## 2011-10-09 MED ORDER — OLMESARTAN MEDOXOMIL-HCTZ 40-12.5 MG PO TABS: 1.0000 | ORAL_TABLET | Freq: Every day | ORAL | Status: DC

## 2011-10-09 MED ORDER — WARFARIN VIDEO: Freq: Once | Status: DC

## 2011-10-09 MED ORDER — METOCLOPRAMIDE HCL 5 MG/ML IJ SOLN: 5.0000 mg | Freq: Three times a day (TID) | INTRAMUSCULAR | Status: DC | PRN

## 2011-10-09 MED ORDER — ONDANSETRON HCL 4 MG/2ML IJ SOLN: 4.0000 mg | Freq: Four times a day (QID) | INTRAMUSCULAR | Status: DC | PRN

## 2011-10-09 MED ORDER — BUPIVACAINE-EPINEPHRINE PF 0.5-1:200000 % IJ SOLN
INTRAMUSCULAR | Status: DC | PRN
  Administered 2011-10-09: 30 mL

## 2011-10-09 MED ORDER — AMIODARONE HCL 200 MG PO TABS
200.0000 mg | ORAL_TABLET | Freq: Every day | ORAL | Status: DC
  Administered 2011-10-10 – 2011-10-11 (×2): 200 mg via ORAL
  Filled 2011-10-09 (×2): qty 1

## 2011-10-09 MED ORDER — PROPOFOL 10 MG/ML IV BOLUS
INTRAVENOUS | Status: DC | PRN
  Administered 2011-10-09: 200 mg via INTRAVENOUS

## 2011-10-09 SURGICAL SUPPLY — 64 items
BANDAGE ELASTIC 4 VELCRO ST LF (GAUZE/BANDAGES/DRESSINGS) ×1 IMPLANT
BANDAGE ESMARK 6X9 LF (GAUZE/BANDAGES/DRESSINGS) ×1 IMPLANT
BANDAGE GAUZE ELAST BULKY 4 IN (GAUZE/BANDAGES/DRESSINGS) ×2 IMPLANT
BLADE SAGITTAL 25.0X1.19X90 (BLADE) ×2 IMPLANT
BLADE SURG ROTATE 9660 (MISCELLANEOUS) IMPLANT
BNDG CMPR 9X6 STRL LF SNTH (GAUZE/BANDAGES/DRESSINGS) ×1
BNDG CMPR MED 10X6 ELC LF (GAUZE/BANDAGES/DRESSINGS)
BNDG ELASTIC 6X10 VLCR STRL LF (GAUZE/BANDAGES/DRESSINGS) ×1 IMPLANT
BNDG ESMARK 6X9 LF (GAUZE/BANDAGES/DRESSINGS) ×2
BOWL SMART MIX CTS (DISPOSABLE) ×2 IMPLANT
CEMENT HV SMART SET (Cement) ×4 IMPLANT
CLOTH BEACON ORANGE TIMEOUT ST (SAFETY) ×2 IMPLANT
COVER SURGICAL LIGHT HANDLE (MISCELLANEOUS) ×2 IMPLANT
CUFF TOURNIQUET SINGLE 34IN LL (TOURNIQUET CUFF) ×2 IMPLANT
CUFF TOURNIQUET SINGLE 44IN (TOURNIQUET CUFF) IMPLANT
DRAPE EXTREMITY T 121X128X90 (DRAPE) ×2 IMPLANT
DRAPE PROXIMA HALF (DRAPES) ×2 IMPLANT
DRAPE U-SHAPE 47X51 STRL (DRAPES) ×2 IMPLANT
DRSG ADAPTIC 3X8 NADH LF (GAUZE/BANDAGES/DRESSINGS) ×1 IMPLANT
DRSG PAD ABDOMINAL 8X10 ST (GAUZE/BANDAGES/DRESSINGS) ×1 IMPLANT
DURAPREP 26ML APPLICATOR (WOUND CARE) ×2 IMPLANT
ELECT REM PT RETURN 9FT ADLT (ELECTROSURGICAL) ×2
ELECTRODE REM PT RTRN 9FT ADLT (ELECTROSURGICAL) ×1 IMPLANT
EVACUATOR 1/8 PVC DRAIN (DRAIN) IMPLANT
FACESHIELD LNG OPTICON STERILE (SAFETY) ×3 IMPLANT
GLOVE BIO SURGEON STRL SZ8.5 (GLOVE) ×2 IMPLANT
GLOVE BIOGEL PI IND STRL 7.0 (GLOVE) IMPLANT
GLOVE BIOGEL PI IND STRL 8 (GLOVE) ×1 IMPLANT
GLOVE BIOGEL PI IND STRL 8.5 (GLOVE) ×1 IMPLANT
GLOVE BIOGEL PI INDICATOR 7.0 (GLOVE) ×1
GLOVE BIOGEL PI INDICATOR 8 (GLOVE) ×1
GLOVE BIOGEL PI INDICATOR 8.5 (GLOVE) ×1
GLOVE ECLIPSE 7.0 STRL STRAW (GLOVE) ×1 IMPLANT
GLOVE SS BIOGEL STRL SZ 8 (GLOVE) ×1 IMPLANT
GLOVE SUPERSENSE BIOGEL SZ 8 (GLOVE) ×1
GOWN PREVENTION PLUS XLARGE (GOWN DISPOSABLE) ×1 IMPLANT
GOWN PREVENTION PLUS XXLARGE (GOWN DISPOSABLE) ×1 IMPLANT
GOWN STRL NON-REIN LRG LVL3 (GOWN DISPOSABLE) ×3 IMPLANT
HANDPIECE INTERPULSE COAX TIP (DISPOSABLE) ×2
HOOD PEEL AWAY FACE SHEILD DIS (HOOD) ×2 IMPLANT
IMMOBILIZER KNEE 20 (SOFTGOODS)
IMMOBILIZER KNEE 20 THIGH 36 (SOFTGOODS) IMPLANT
IMMOBILIZER KNEE 22 UNIV (SOFTGOODS) ×1 IMPLANT
IMMOBILIZER KNEE 24 THIGH 36 (MISCELLANEOUS) IMPLANT
IMMOBILIZER KNEE 24 UNIV (MISCELLANEOUS)
KIT BASIN OR (CUSTOM PROCEDURE TRAY) ×2 IMPLANT
KIT ROOM TURNOVER OR (KITS) ×2 IMPLANT
MANIFOLD NEPTUNE II (INSTRUMENTS) ×2 IMPLANT
NS IRRIG 1000ML POUR BTL (IV SOLUTION) ×2 IMPLANT
PACK TOTAL JOINT (CUSTOM PROCEDURE TRAY) ×2 IMPLANT
PAD ARMBOARD 7.5X6 YLW CONV (MISCELLANEOUS) ×4 IMPLANT
SET HNDPC FAN SPRY TIP SCT (DISPOSABLE) ×1 IMPLANT
SPONGE GAUZE 4X4 12PLY (GAUZE/BANDAGES/DRESSINGS) ×1 IMPLANT
STAPLER VISISTAT 35W (STAPLE) ×2 IMPLANT
SUCTION FRAZIER TIP 10 FR DISP (SUCTIONS) IMPLANT
SUT VIC AB 0 CT1 27 (SUTURE) ×4
SUT VIC AB 0 CT1 27XBRD ANBCTR (SUTURE) ×2 IMPLANT
SUT VIC AB 2-0 CT1 27 (SUTURE) ×4
SUT VIC AB 2-0 CT1 TAPERPNT 27 (SUTURE) ×2 IMPLANT
SUT VLOC 180 0 24IN GS25 (SUTURE) ×2 IMPLANT
TOWEL OR 17X24 6PK STRL BLUE (TOWEL DISPOSABLE) ×2 IMPLANT
TOWEL OR 17X26 10 PK STRL BLUE (TOWEL DISPOSABLE) ×2 IMPLANT
TRAY FOLEY CATH 14FR (SET/KITS/TRAYS/PACK) ×2 IMPLANT
WATER STERILE IRR 1000ML POUR (IV SOLUTION) ×5 IMPLANT

## 2011-10-09 NOTE — Transfer of Care (Signed)
Immediate Anesthesia Transfer of Care Note  Patient: Harry Foster  Procedure(s) Performed: Procedure(s) (LRB) with comments: TOTAL KNEE ARTHROPLASTY (Left) - left total knee arthroplasty  Patient Location: PACU  Anesthesia Type: General  Level of Consciousness: awake, alert  and oriented  Airway & Oxygen Therapy: Patient Spontanous Breathing and Patient connected to nasal cannula oxygen  Post-op Assessment: Report given to PACU RN and Post -op Vital signs reviewed and stable  Post vital signs: Reviewed and stable  Complications: No apparent anesthesia complications

## 2011-10-09 NOTE — Anesthesia Postprocedure Evaluation (Signed)
  Anesthesia Post-op Note  Patient: Harry Foster  Procedure(s) Performed: Procedure(s) (LRB) with comments: TOTAL KNEE ARTHROPLASTY (Left) - left total knee arthroplasty  Patient Location: PACU  Anesthesia Type: GA combined with regional for post-op pain  Level of Consciousness: awake  Airway and Oxygen Therapy: Patient Spontanous Breathing  Post-op Pain: mild  Post-op Assessment: Post-op Vital signs reviewed  Post-op Vital Signs: stable  Complications: No apparent anesthesia complications

## 2011-10-09 NOTE — Interval H&P Note (Signed)
History and Physical Interval Note:  10/09/2011 2:46 AM  Harry Foster  has presented today for surgery, with the diagnosis of DEGENERATIVE JOINT DISEASE LEFT KNEE  The various methods of treatment have been discussed with the patient and family. After consideration of risks, benefits and other options for treatment, the patient has consented to  Procedure(s) (LRB) with comments: TOTAL KNEE ARTHROPLASTY (Left) as a surgical intervention .  The patient's history has been reviewed, patient examined, no change in status, stable for surgery.  I have reviewed the patient's chart and labs.  Questions were answered to the patient's satisfaction.     Erique Kaser G

## 2011-10-09 NOTE — Interval H&P Note (Signed)
History and Physical Interval Note:  10/09/2011 7:27 AM  Harry Foster  has presented today for surgery, with the diagnosis of [place]  The various methods of treatment have been discussed with the patient and family. After consideration of risks, benefits and other options for treatment, the patient has consented to  Procedure(s) (LRB) with comments: TOTAL KNEE ARTHROPLASTY (Left) as a surgical intervention .  The patient's history has been reviewed, patient examined, no change in status, stable for surgery.  I have reviewed the patient's chart and labs.  Questions were answered to the patient's satisfaction.     [name] G

## 2011-10-09 NOTE — Progress Notes (Signed)
Report to Baruch Goldmann RN as caregiver

## 2011-10-09 NOTE — Progress Notes (Signed)
Orthopedic Tech Progress Note Patient Details:  Harry Foster 03-16-1945 [id number] CPM applied to Left LE with appropriate settings. No OHF available at this time for patient's bed. CPM Left Knee CPM Left Knee: On Left Knee Flexion (Degrees): 60  Left Knee Extension (Degrees): 0    [name] 10/09/2011, 11:46 AM

## 2011-10-09 NOTE — Anesthesia Preprocedure Evaluation (Signed)
Anesthesia Evaluation  Patient identified by MRN, date of birth, ID band Patient awake    Reviewed: Allergy & Precautions, H&P , NPO status , Patient's Chart, lab work & pertinent test results  History of Anesthesia Complications Negative for: history of anesthetic complications  Airway Mallampati: III  Neck ROM: Full  Mouth opening: Limited Mouth Opening  Dental  (+) Teeth Intact   Pulmonary  breath sounds clear to auscultation        Cardiovascular hypertension, + dysrhythmias Atrial Fibrillation Rhythm:Regular Rate:Normal     Neuro/Psych    GI/Hepatic negative GI ROS, Neg liver ROS,   Endo/Other    Renal/GU negative Renal ROS     Musculoskeletal   Abdominal (+) + obese,   Peds  Hematology   Anesthesia Other Findings   Reproductive/Obstetrics                           Anesthesia Physical Anesthesia Plan  ASA: III  Anesthesia Plan: General   Post-op Pain Management:    Induction: Intravenous  Airway Management Planned: Oral ETT and LMA  Additional Equipment:   Intra-op Plan:   Post-operative Plan: Extubation in OR  Informed Consent: I have reviewed the patients History and Physical, chart, labs and discussed the procedure including the risks, benefits and alternatives for the proposed anesthesia with the patient or authorized representative who has indicated his/her understanding and acceptance.   Dental advisory given  Plan Discussed with: CRNA  Anesthesia Plan Comments:         Anesthesia Quick Evaluation

## 2011-10-09 NOTE — Progress Notes (Signed)
ANTICOAGULATION CONSULT NOTE - Initial Consult  Pharmacy Consult for Coumadin  Indication: VTE prophylaxis & Atrial Fibrillation  Allergies  Allergen Reactions  . Lisinopril Cough   Vital Signs: Temp: 97.9 F (36.6 C) (10/08 1110) Temp src: Oral (10/08 0636) BP: 113/75 mmHg (10/08 1110) Pulse Rate: 53  (10/08 1110)  Labs:  Basename 10/09/11 0552  HGB --  HCT --  PLT --  APTT --  LABPROT 14.1  INR 1.10  HEPARINUNFRC --  CREATININE --  CKTOTAL --  CKMB --  TROPONINI --   Medical History: Past Medical History  Diagnosis Date  . GI bleed 2010  . Hypertension   . Recurrent upper respiratory infection (URI)     completing treatment for sinus infection - with abx  . Arthritis     both knees and hands and left elbow  . Depression     treated for depression when wife was very ill  . GI bleed     hx  . Diverticulosis     hx - no sx  . Dysrhythmia     hx A fib, on anticoagulants   Assessment: 66 yr old male being continued on Coumadin for Atrial fibrillation & VTE prophylaxis s/p total knee arthroplasty. Also on Lovenox until INR is therapeutic. Prior to arrival, home dose was Coumadin 7.5mg  daily. Baseline INR today is 1.10 (off of coumadin for surgery). Will boost dose x1 tonight.  Goal of Therapy:  INR 2-3 Monitor platelets by anticoagulation protocol: Yes   Plan:  1) Coumadin 11 mg x 1 tonight 2) Coumadin book and video 3) Follow-up daily INR 4) D/C Lovenox when INR therapuetic  Woodroe Chen, PharmD    10/09/2011   1:29 PM

## 2011-10-09 NOTE — Anesthesia Procedure Notes (Signed)
Anesthesia Regional Block:  Femoral nerve block  Pre-Anesthetic Checklist: ,, timeout performed, Correct Patient, Correct Site, Correct Laterality, Correct Procedure, Correct Position, site marked, Risks and benefits discussed, at surgeon's request and post-op pain management  Laterality: Left and Upper  Prep: Betadine       Needles:  Injection technique: Single-shot  Needle Type: Stimulator Needle - 80      Needle Gauge: 22 and 22 G  Needle insertion depth: 6 cm   Additional Needles:  Procedures: nerve stimulator Femoral nerve block  Nerve Stimulator or Paresthesia:  Response: Twitch elicited, 0.8 mA,   Additional Responses:   Narrative:  Start time: 10/09/2011 6:55 AM End time: 10/09/2011 7:10 AM Injection made incrementally with aspirations every 5 mL.  Performed by: Personally  Anesthesiologist: Bartolo Darter, MD  Additional Notes: BP cuff, EKG monitors applied. Sedation begun. Femoral artery palpated for location of nerve. After nerve location anesthetic injected incrementally, slowly , and after neg aspirations. Tolerated well.  Femoral nerve block

## 2011-10-09 NOTE — Op Note (Signed)
PREOP DIAGNOSIS: DJD LEFT KNEE POSTOP DIAGNOSIS: DJD LEFT KNEE PROCEDURE: LEFT TKR ANESTHESIA: General and block ATTENDING SURGEON: Clarissa Laird Foster ASSISTANT: Roselee Nova PA  INDICATIONS FOR PROCEDURE: Harry Foster is a 66 y.o. male who has struggled for a long time with pain due to degenerative arthritis of the left knee.  The patient has failed many conservative non-operative measures and at this point has pain which limits the ability to sleep and walk.  The patient is offered total knee replacement.  Informed operative consent was obtained after discussion of possible risks of anesthesia, infection, neurovascular injury, DVT, and death.  The importance of the post-operative rehabilitation protocol to optimize result was stressed extensively with the patient.  SUMMARY OF FINDINGS AND PROCEDURE:  Harry Foster was taken to the operative suite where under the above anesthesia a left knee replacement was performed.  There were advanced degenerative changes and the bone quality was excellent.  We used the DePuy system and placed size large femur, 5MBT tibia, 41 mm all polyethylene patella, and a size 4mm spacer.  I did include Zinacef antibiotic in the cement.  The patient was admitted for appropriate post-op care to include perioperative antibiotics and mechanical and pharmacologic measures for DVT prophylaxis.  DESCRIPTION OF PROCEDURE:  Harry Foster was taken to the operative suite where the above anesthesia was applied.  The patient was positioned supine and prepped and draped in normal sterile fashion.  An appropriate time out was performed.  After the administration of Kefzol pre-op antibiotic a standard longitudinal incision was made on the anterior knee.  Dissection was carried down to the extensor mechanism.  All appropriate anti-infective measures were used including the pre-operative antibiotic, betadine impregnated drape, and closed hooded exhaust systems for each member of the  surgical team.  A medial parapatellar incision was made in the extensor mechanism and the knee cap flipped and the knee flexed.  Some residual meniscal tissues were removed along with any remaining ACL/PCL tissue.  A guide was placed on the tibia and a flat cut was made on it's superior surface.  An intramedullary guide was placed in the femur and was utilized to make anterior and posterior cuts creating an appropriate flexion gap.  A second intramedullary guide was placed in the femur to make a distal cut properly balancing the knee with an extension gap equal to the flexion gap.  The three bones sized to the above mentioned sizes and the appropriate guides were placed and utilized.  A trial reduction was done and the knee easily came to full extension and the patella tracked well on flexion.  The trial components were removed and all bones were cleaned with pulsatile lavage and then dried thoroughly.  Cement was mixed including antibiotic and was pressurized onto the bones followed by placement of the aforementioned components.  Excess cement was trimmed and pressure was held on the components until the cement had hardened.  The tourniquet was deflated and a small amount of bleeding was controlled with cautery and pressure.  The knee was irrigated thoroughly.  The extensor mechanism was re-approximated with V-loc suture in running fashion.  The knee was flexed and the repair was solid.  The subcutaneous tissues were re-approximated with #0 and #2-0 vicryl and the skin closed with a running subcuticular stitch.  A sterile dressing was applied.  Intraoperative fluids, EBL, and tourniquet time can be obtained from anesthesia records.  DISPOSITION:  The patient was taken to recovery room in stable condition  and admitted for appropriate post-op care to include peri-operative antibiotic and DVT prophylaxis with mechanical and pharmacologic measures. He will resume his coumadin immediately.  Harry Alejandro  Foster 10/09/2011, 9:35 AM

## 2011-10-09 NOTE — Preoperative (Signed)
Beta Blockers   Reason not to administer Beta Blockers:Not Applicable 

## 2011-10-10 LAB — CBC
HCT: 39.9 % (ref 39.0–52.0)
Hemoglobin: 13.1 g/dL (ref 13.0–17.0)
RDW: 13.2 % (ref 11.5–15.5)
WBC: 9.1 10*3/uL (ref 4.0–10.5)

## 2011-10-10 LAB — BASIC METABOLIC PANEL
BUN: 26 mg/dL — ABNORMAL HIGH (ref 6–23)
Chloride: 102 mEq/L (ref 96–112)
GFR calc Af Amer: 46 mL/min — ABNORMAL LOW (ref >90)
Glucose, Bld: 129 mg/dL — ABNORMAL HIGH (ref 70–99)
Potassium: 4.8 mEq/L (ref 3.5–5.1)

## 2011-10-10 LAB — PROTIME-INR: INR: 1.25 (ref 0.00–1.49)

## 2011-10-10 MED ORDER — OXYCODONE-ACETAMINOPHEN 5-325 MG PO TABS
1.0000 | ORAL_TABLET | ORAL | Status: DC | PRN
  Administered 2011-10-10 – 2011-10-11 (×4): 2 via ORAL
  Filled 2011-10-10 (×4): qty 2

## 2011-10-10 MED ORDER — WARFARIN SODIUM 10 MG PO TABS
10.0000 mg | ORAL_TABLET | Freq: Once | ORAL | Status: AC
  Administered 2011-10-10: 10 mg via ORAL
  Filled 2011-10-10: qty 1

## 2011-10-10 NOTE — Progress Notes (Signed)
Physical Therapy Treatment Patient Details Name: Harry Foster MRN: [id number] DOB: 19-Aug-1945 Today's Date: 10/10/2011 Time: [id number] PT Time Calculation (min): 23 min  PT Assessment / Plan / Recommendation Comments on Treatment Session  Pt demonstrates progress towards PT goals at this time.  Pt limited by pain and fatigue.  Will continue to progress activities as tolerated.    Follow Up Recommendations  Post acute inpatient        Barriers to Discharge Decreased caregiver support lives alone with no assistance    Equipment Recommendations  None recommended by PT    Recommendations for Other Services    Frequency 7X/week   Plan Discharge plan remains appropriate    Precautions / Restrictions Precautions Precautions: Knee Restrictions Weight Bearing Restrictions: Yes LLE Weight Bearing: Weight bearing as tolerated   Pertinent Vitals/Pain 6/10    Mobility  Bed Mobility Bed Mobility: Not assessed Rolling Right: 5: Supervision Rolling Left: 5: Supervision Supine to Sit: 5: Supervision;HOB flat;With rails Sitting - Scoot to Edge of Bed: 5: Supervision;With rail Sit to Supine: 4: Min assist;HOB flat Details for Bed Mobility Assistance: (A) with supporting LLE due to pain  Transfers Transfers: Sit to Stand;Stand to Sit Sit to Stand: 4: Min guard;With upper extremity assist;With armrests;From chair/3-in-1 Stand to Sit: 4: Min guard;To bed;With upper extremity assist Details for Transfer Assistance: VC's for handplacement and body positioning Ambulation/Gait Ambulation/Gait Assistance: 4: Min guard (secondary to instability and buckling upon fatigue) Ambulation Distance (Feet): 80 Feet Assistive device: Rolling walker Ambulation/Gait Assistance Details: VC's for upright posture and quad setting technique Gait Pattern: Step-to pattern;Decreased stride length;Decreased stance time - left;Antalgic Gait velocity: decreased General Gait Details: Pt VC'd to set quad during  ambulation secondary to buckling during loading response without immobilizer (dc'd per MD orders) Stairs: No    Exercises Total Joint Exercises Ankle Circles/Pumps: AROM;20 reps;Both Quad Sets: Strengthening;Left;10 reps   PT Diagnosis: Difficulty walking;Abnormality of gait;Generalized weakness;Acute pain  PT Problem List: Decreased strength;Decreased range of motion;Decreased activity tolerance;Decreased balance;Decreased mobility;Pain PT Treatment Interventions: DME instruction;Gait training;Functional mobility training;Therapeutic activities;Therapeutic exercise;Patient/family education   PT Goals Acute Rehab PT Goals PT Goal Formulation: With patient Time For Goal Achievement: 10/15/11 Potential to Achieve Goals: Good Pt will go Supine/Side to Sit: with modified independence PT Goal: Supine/Side to Sit - Progress: Goal set today Pt will go Sit to Stand: with modified independence PT Goal: Sit to Stand - Progress: Progressing toward goal Pt will go Stand to Sit: with modified independence PT Goal: Stand to Sit - Progress: Progressing toward goal Pt will Ambulate: >150 feet;with modified independence;with rolling walker PT Goal: Ambulate - Progress: Progressing toward goal Pt will Perform Home Exercise Program: Independently PT Goal: Perform Home Exercise Program - Progress: Goal set today  Visit Information  Last PT Received On: 10/10/11 Assistance Needed: +1    Subjective Data  Subjective: I stayed up in the chair the whole afternoon Patient Stated Goal: to go home after short rehab stay   Cognition  Overall Cognitive Status: Appears within functional limits for tasks assessed/performed Arousal/Alertness: Awake/alert Orientation Level: Appears intact for tasks assessed;Oriented X4 / Intact Behavior During Session: [facility] for tasks performed       End of Session PT - End of Session Equipment Utilized During Treatment: Gait belt Activity Tolerance: Patient limited by  fatigue;Patient limited by pain Patient left: in chair;with call bell/phone within reach;Other (comment) (OT present) Nurse Communication: Mobility status CPM Left Knee CPM Left Knee: Off   GP  Duncan Dull 10/10/2011, 3:32 PM Alben Deeds, [place] DPT  [phone]

## 2011-10-10 NOTE — Progress Notes (Signed)
Occupational Therapy Evaluation Patient Details Name: Harry Foster MRN: [id number] DOB: 1945-01-30 Today's Date: 10/10/2011 Time: 26 minutes   OT Assessment / Plan / Recommendation Clinical Impression  Pt. 66 yo male s/p left TKA. Pt. is min guard for ambulation and transfers. Able to complete grooming tasks at sink level with supervision. Will address tub transfer and LB dressing in next session. Would benefiit from OT acutely to increase independece with ADL's     OT Assessment  Patient needs continued OT Services    Follow Up Recommendations  Skilled nursing facility    Barriers to Discharge      Equipment Recommendations  None recommended by OT    Recommendations for Other Services    Frequency  Min 2X/week    Precautions / Restrictions Precautions Precautions: Knee Restrictions Weight Bearing Restrictions: Yes LLE Weight Bearing: Weight bearing as tolerated   Pertinent Vitals/Pain None reported    ADL  Grooming: Performed;Wash/dry hands;Min guard Where Assessed - Grooming: Unsupported standing Toilet Transfer: Simulated;Min Psychiatric nurse Method: Sit to Loss adjuster, chartered: Raised toilet seat with arms (or 3-in-1 over toilet) Toileting - Clothing Manipulation and Hygiene: Performed;Independent Where Assessed - Toileting Clothing Manipulation and Hygiene: Standing Equipment Used: Gait belt;Rolling walker Transfers/Ambulation Related to ADLs: Pt. is min guard for transfers and ambulation due to left knee buckles, although pt. able to self correct.   ADL Comments: Pt. completed toileting with supervision for safety as well as as grooming tasks at sink level. Will address LB dressing and tub transfer in next session.     OT Diagnosis: Generalized weakness;Acute pain  OT Problem List: Decreased activity tolerance;Decreased knowledge of use of DME or AE;Pain OT Treatment Interventions: Self-care/ADL training;Therapeutic exercise;DME and/or AE  instruction;Patient/family education   OT Goals Acute Rehab OT Goals OT Goal Formulation: With patient Time For Goal Achievement: 10/24/11 Potential to Achieve Goals: Good ADL Goals Pt Will Perform Lower Body Dressing: with min assist;Sit to stand from chair ADL Goal: Lower Body Dressing - Progress: Goal set today Pt Will Transfer to Toilet: with supervision;Ambulation;3-in-1 ADL Goal: Toilet Transfer - Progress: Goal set today Pt Will Perform Tub/Shower Transfer: Tub transfer;with min assist;Shower seat with back ADL Goal: Tub/Shower Transfer - Progress: Goal set today  Visit Information  Last OT Received On: 10/10/11 Assistance Needed: +1    Subjective Data      Prior Functioning     Home Living Lives With: Alone Available Help at Discharge: [place] Type of Home: House Home Access: Level entry Home Layout: One level Bathroom Shower/Tub: Tub/shower unit;Curtain Bathroom Toilet: Handicapped height Bathroom Accessibility: No Home Adaptive Equipment: Walker - rolling;Straight cane;Shower chair with back Prior Function Level of Independence: Independent with assistive device(s) Able to Take Stairs?: Yes Driving: Yes Vocation: Retired 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 difficulties Dominant Hand: Right         Vision/Perception     Cognition  Overall Cognitive Status: Appears within functional limits for tasks assessed/performed Arousal/Alertness: Awake/alert Orientation Level: Appears intact for tasks assessed;Oriented X4 / Intact Behavior During Session: [facility] for tasks performed    Extremity/Trunk Assessment Right Upper Extremity Assessment RUE ROM/Strength/Tone: [facility] for tasks assessed Left Upper Extremity Assessment LUE ROM/Strength/Tone: WFL for tasks assessed Right Lower Extremity Assessment RLE ROM/Strength/Tone: [facility] for tasks assessed Left Lower Extremity Assessment LLE ROM/Strength/Tone: Deficits;Unable to fully assess;Due to pain;Due  to precautions Trunk Assessment Trunk Assessment: Normal     Mobility Bed Mobility Bed Mobility: Sit to Supine Rolling Right: 5: Supervision Rolling  Left: 5: Supervision Supine to Sit: 5: Supervision;HOB flat;With rails Sitting - Scoot to Edge of Bed: 5: Supervision;With rail Sit to Supine: 4: Min assist;HOB flat Details for Bed Mobility Assistance: (A) with supporting LLE due to pain  Transfers Transfers: Sit to Stand;Stand to Sit Sit to Stand: 4: Min guard;With upper extremity assist;With armrests;From chair/3-in-1 Stand to Sit: 4: Min guard;To bed;With upper extremity assist Details for Transfer Assistance: cues for proper hand placement.           Exercise Total Joint Exercises Ankle Circles/Pumps: AROM;20 reps;Both Quad Sets: Strengthening;Left;10 reps        End of Session OT - End of Session Equipment Utilized During Treatment: Gait belt Activity Tolerance: Patient tolerated treatment well Patient left: in bed;with call bell/phone within reach Nurse Communication: Mobility status CPM Left Knee CPM Left Knee: Off  GO     Sharyn Creamer 10/10/2011, 3:15 PM

## 2011-10-10 NOTE — Progress Notes (Signed)
ANTICOAGULATION CONSULT NOTE - Follow Up Consult  Pharmacy Consult for Coumadin Indication: VTE prophylaxis  Allergies  Allergen Reactions  . Lisinopril Cough    Vital Signs: Temp: 98.8 F (37.1 C) (10/09 0644) Temp src: Oral (10/08 2203) BP: 122/56 mmHg (10/09 0644) Pulse Rate: 74  (10/09 0644)  Labs:  Basename 10/10/11 0640 10/09/11 0552  HGB 13.1 --  HCT 39.9 --  PLT 150 --  APTT -- --  LABPROT 15.5* 14.1  INR 1.25 1.10  HEPARINUNFRC -- --  CREATININE 1.73* --  CKTOTAL -- --  CKMB -- --  TROPONINI -- --    The CrCl is unknown because both a height and weight (above a minimum accepted value) are required for this calculation.  Assessment: Patient is a 66 y.o M on coumadin PTA for afib, s/p TKA POD #1.  INR is subtherapeutic but is trending up.  No bleeding noted.  Goal of Therapy:  INR 2-3  Plan:  1)  Coumadin 10mg  x1 today  Takuma Cifelli P 10/10/2011,8:54 AM

## 2011-10-10 NOTE — Progress Notes (Signed)
Physical Therapy Treatment Patient Details Name: Harry Foster MRN: [id number] DOB: 1945-07-27 Today's Date: 10/10/2011 Time: [id number] PT Time Calculation (min): 25 min  PT Assessment / Plan / Recommendation Comments on Treatment Session       Follow Up Recommendations  Post acute inpatient     Does the patient have the potential to tolerate intense rehabilitation  Yes, Recommend IP Rehab Screening  Barriers to Discharge Decreased caregiver support lives alone with no assistance    Equipment Recommendations  None recommended by PT    Recommendations for Other Services    Frequency 7X/week   Plan      Precautions / Restrictions Precautions Precautions: Knee Restrictions Weight Bearing Restrictions: Yes LLE Weight Bearing: Weight bearing as tolerated   Pertinent Vitals/Pain 8/10    Mobility  Bed Mobility Bed Mobility: Rolling Right;Rolling Left;Supine to Sit;Sitting - Scoot to Edge of Bed Rolling Right: 5: Supervision Rolling Left: 5: Supervision Supine to Sit: 5: Supervision;HOB flat;With rails Sitting - Scoot to Edge of Bed: 5: Supervision;With rail Transfers Transfers: Sit to Stand;Stand to Sit Sit to Stand: 4: Min guard;With upper extremity assist;From bed Stand to Sit: 4: Min guard;To chair/3-in-1;With armrests Details for Transfer Assistance: VC's for hand placement and body positioning Ambulation/Gait Ambulation/Gait Assistance: 4: Min guard Ambulation Distance (Feet): 12 Feet Assistive device: Rolling walker Ambulation/Gait Assistance Details: VC's for sequencing, upright posture and WBing Gait Pattern: Step-to pattern;Decreased stride length;Decreased stance time - left;Antalgic Gait velocity: decreased General Gait Details: Minor instability noted with flexed trunk compensation Stairs: No    Exercises Total Joint Exercises Ankle Circles/Pumps: AROM;20 reps;Both Quad Sets: Strengthening;Left;10 reps   PT Diagnosis: Difficulty walking;Abnormality of  gait;Generalized weakness;Acute pain  PT Problem List: Decreased strength;Decreased range of motion;Decreased activity tolerance;Decreased balance;Decreased mobility;Pain PT Treatment Interventions: DME instruction;Gait training;Functional mobility training;Therapeutic activities;Therapeutic exercise;Patient/family education   PT Goals Acute Rehab PT Goals PT Goal Formulation: With patient Time For Goal Achievement: 10/15/11 Potential to Achieve Goals: Good Pt will go Supine/Side to Sit: with modified independence PT Goal: Supine/Side to Sit - Progress: Goal set today Pt will go Sit to Stand: with modified independence PT Goal: Sit to Stand - Progress: Goal set today Pt will go Stand to Sit: with modified independence PT Goal: Stand to Sit - Progress: Goal set today Pt will Ambulate: >150 feet;with modified independence;with rolling walker PT Goal: Ambulate - Progress: Goal set today Pt will Perform Home Exercise Program: Independently PT Goal: Perform Home Exercise Program - Progress: Goal set today  Visit Information  Last PT Received On: 10/10/11 Assistance Needed: +1    Subjective Data  Subjective: I did not sleep well at all Patient Stated Goal: to go home after short rehab stay   Cognition  Overall Cognitive Status: Appears within functional limits for tasks assessed/performed Arousal/Alertness: Awake/alert Orientation Level: Appears intact for tasks assessed;Oriented X4 / Intact Behavior During Session: [facility] for tasks performed       End of Session PT - End of Session Equipment Utilized During Treatment: Gait belt;Left knee immobilizer Activity Tolerance: Patient limited by fatigue;Patient limited by pain Patient left: in chair;with call bell/phone within reach Nurse Communication: Mobility status;Patient requests pain meds CPM Left Knee CPM Left Knee: Off   GP     Harry Foster 10/10/2011, 1:17 PM Alben Deeds, [place] DPT  [phone]

## 2011-10-10 NOTE — Progress Notes (Signed)
Subjective: 1 Day Post-Op Procedure(s) (LRB): TOTAL KNEE ARTHROPLASTY (Left)  Activity level:  oob with pt Diet tolerance:  eating Voiding:  Foley coming out Patient reports pain as 3 on 0-10 scale.    Objective: Vital signs in last 24 hours: Temp:  [97.5 F (36.4 C)-99.3 F (37.4 C)] 98.8 F (37.1 C) (10/09 0644) Pulse Rate:  [47-74] 74  (10/09 0644) Resp:  [9-22] 18  (10/09 0644) BP: (113-150)/(56-85) 122/56 mmHg (10/09 0644) SpO2:  [85 %-100 %] 96 % (10/09 0644)  Labs:  Basename 10/10/11 0640  HGB 13.1    Basename 10/10/11 0640  WBC 9.1  RBC 4.17*  HCT 39.9  PLT 150   No results found for this basename: NA:2,K:2,CL:2,CO2:2,BUN:2,CREATININE:2,GLUCOSE:2,CALCIUM:2 in the last 72 hours  Basename 10/10/11 0640 10/09/11 0552  LABPT -- --  INR 1.25 1.10    Physical Exam:  Neurologically intact ABD soft Neurovascular intact Sensation intact distally Intact pulses distally Dorsiflexion/Plantar flexion intact Incision: scant drainage No cellulitis present Compartment soft  Assessment/Plan:  1 Day Post-Op Procedure(s) (LRB): TOTAL KNEE ARTHROPLASTY (Left) Advance diet Up with therapy D/C IV fluids Discharge to SNF on thurs or fri lovenox / coumadin Will add on percocet as well    Harry Foster R 10/10/2011, 8:01 AM

## 2011-10-10 NOTE — Clinical Social Work Psychosocial (Signed)
     Clinical Social Work Department BRIEF PSYCHOSOCIAL ASSESSMENT 10/10/2011  Patient:  Harry Foster, Harry Foster     Account Number:  [id number]     Admit date:  10/09/2011  Clinical Social Worker:  Iona Coach  Date/Time:  10/10/2011 04:43 PM  Referred by:  Physician  Date Referred:  10/10/2011 Referred for  SNF Placement   Other Referral:   CIR to assess patient as well   Interview type:  Patient Other interview type:    PSYCHOSOCIAL DATA Living Status:  ALONE Admitted from facility:   Level of care:   Primary support name:  Harry Foster Primary support relationship to patient:  PARENT Degree of support available:   Strong support    CURRENT CONCERNS Current Concerns  Post-Acute Placement   Other Concerns:    SOCIAL WORK ASSESSMENT / PLAN Met with patient and his mother this afternoon. Patient is requesting short term SNF at [facility] and has pre-arranged his rehab with facility. [place]- Admission who confirmed same. Fl2 placed on chart. Patient  has [employer]- will request authorization.   Assessment/plan status:  Psychosocial Support/Ongoing Assessment of Needs Other assessment/ plan:   Information/referral to community resources:   SNF list deferred as patient has pre-chosen SNF.    PATIENTS/FAMILYS RESPONSE TO PLAN OF CARE: Patient is alert and oriented.  He wants short term SNF at [facility]. PT has recommended referral to CIR as well and this was discussed with patient. He is not interested in CIR and only wants to go to [facility]. Patient will return home after rehab stay. Both patient and his mother were very appreciative of CSW's visit and assistance.

## 2011-10-10 NOTE — Clinical Social Work Placement (Addendum)
    Clinical Social Work Department CLINICAL SOCIAL WORK PLACEMENT NOTE 10/10/2011  Patient:  JOURDEN, KIRLEY  Account Number:  [id number] Admit date:  10/09/2011  Clinical Social Worker:  Butch Penny Alayla Dethlefs, LCSWA  Date/time:  10/10/2011 05:07 PM  Clinical Social Work is seeking post-discharge placement for this patient at the following level of care:   SKILLED NURSING   (*CSW will update this form in Epic as items are completed)     Patient/family provided with [facility] Department of Clinical Social Work's list of facilities offering this level of care within the geographic area requested by the patient (or if unable, by the patient's family).    Patient/family informed of their freedom to choose among providers that offer the needed level of care, that participate in Medicare, Medicaid or managed care program needed by the patient, have an available bed and are willing to accept the patient.    Patient/family informed of [facility]' ownership interest in [facility], as well as of the fact that they are under no obligation to receive care at this facility.  PASARR submitted to EDS on 10/10/2011 PASARR number received from EDS on 10/10/2011  FL2 transmitted to all facilities in geographic area requested by pt/family on  10/10/2011 FL2 transmitted to all facilities within larger geographic area on   Patient informed that his/her managed care company has contracts with or will negotiate with  certain facilities, including the following:   [place] Medicare.  Patient deferred SNF list- pre-chose [facility].  SNF referral sent to [facility] only.     Patient/family informed of bed offers received:  10/10/2011 Patient chooses bed at [place] Physician recommends and patient chooses bed at    Patient to be transferred to [facility] on   Patient to be transferred to facility by [facility])  The following physician request were entered in Epic:   Additional  Comments:Patient is very pleased with d/c plan. He stated that he has notified his mother of his scheduled d/c. Notified SNF and pt's nurse of d/c.  Patient is content with d/c plan.  Lorie Phenix. [place], [place]

## 2011-10-10 NOTE — Progress Notes (Signed)
CARE MANAGEMENT NOTE 10/10/2011  Patient:  Harry Foster, Harry Foster   Account Number:  [id number]  Date Initiated:  10/10/2011  Documentation initiated by:  Ricki Miller  Subjective/Objective Assessment:   66 yr old male s/p left total knee arthroplasty     Action/Plan:   Progression and discharge planning.  Patient is going to [facility] for shortterm rehab. Social worker Kendell Bane is aware.   Anticipated DC Date:  10/12/2011   Anticipated DC Plan:  [place]  CM consult      Choice offered to / List presented to:             Status of service:  Completed, signed off Medicare Important Message given?   (If response is "NO", the following Medicare IM given date fields will be blank) Date Medicare IM given:   Date Additional Medicare IM given:    Discharge Disposition:  SKILLED NURSING FACILITY  Per UR Regulation:    If discussed at Long Length of Stay Meetings, dates discussed:    Comments:

## 2011-10-10 NOTE — Progress Notes (Signed)
I agree with the following treatment note after reviewing documentation.   [name], Saskia Simerson Brynn   OTR/L Pager: [phone] Office: [phone] .   

## 2011-10-11 LAB — CBC
HCT: 38.3 % — ABNORMAL LOW (ref 39.0–52.0)
Hemoglobin: 12.7 g/dL — ABNORMAL LOW (ref 13.0–17.0)
MCV: 95 fL (ref 78.0–100.0)
RBC: 4.03 MIL/uL — ABNORMAL LOW (ref 4.22–5.81)
WBC: 8.9 10*3/uL (ref 4.0–10.5)

## 2011-10-11 LAB — BASIC METABOLIC PANEL
BUN: 28 mg/dL — ABNORMAL HIGH (ref 6–23)
CO2: 30 mEq/L (ref 19–32)
Chloride: 101 mEq/L (ref 96–112)
Creatinine, Ser: 1.65 mg/dL — ABNORMAL HIGH (ref 0.50–1.35)
Glucose, Bld: 147 mg/dL — ABNORMAL HIGH (ref 70–99)

## 2011-10-11 MED ORDER — OXYCODONE-ACETAMINOPHEN 10-325 MG PO TABS
ORAL_TABLET | ORAL | Status: DC
Start: 2011-10-11 — End: 2013-09-25

## 2011-10-11 MED ORDER — ENOXAPARIN SODIUM 30 MG/0.3ML ~~LOC~~ SOLN: 30.0000 mg | Freq: Two times a day (BID) | SUBCUTANEOUS | Status: DC

## 2011-10-11 MED ORDER — METHOCARBAMOL 500 MG PO TABS: 500.0000 mg | ORAL_TABLET | Freq: Four times a day (QID) | ORAL | Status: DC | PRN

## 2011-10-11 NOTE — Discharge Instructions (Signed)
May change dressing every other day. Continue on Lovenox until INR is 1.8 or greater Return to office in 2 weeks.

## 2011-10-11 NOTE — Progress Notes (Signed)
I agree with the following treatment note after reviewing documentation.   [name], [name]   OTR/L Pager: [phone] Office: [phone] .   

## 2011-10-11 NOTE — Progress Notes (Signed)
I agree with the following treatment note after reviewing documentation.   [name], Ayn Domangue Brynn   OTR/L Pager: [phone] Office: [phone] .   

## 2011-10-11 NOTE — Progress Notes (Signed)
Physical Therapy Treatment Patient Details Name: Harry Foster MRN: [id number] DOB: [birth year] Today's Date: 10/11/2011 Time: [id number] PT Time Calculation (min): 34 min  PT Assessment / Plan / Recommendation Comments on Treatment Session  Pt progressing well with all mobility this session, although still limited secondary to pain. Pt able to achieve 85 degrees in AAROM seated. Continue per plan.     Follow Up Recommendations  Post acute inpatient     Does the patient have the potential to tolerate intense rehabilitation  No, Recommend SNF  Barriers to Discharge        Equipment Recommendations  None recommended by PT    Recommendations for Other Services    Frequency 7X/week   Plan Discharge plan remains appropriate    Precautions / Restrictions Precautions Precautions: Knee Restrictions Weight Bearing Restrictions: Yes LLE Weight Bearing: Weight bearing as tolerated   Pertinent Vitals/Pain Pt with 5/10 pain with knee flexion.    Mobility  Bed Mobility Bed Mobility: Supine to Sit;Sitting - Scoot to Edge of Bed;Sit to Supine Supine to Sit: 4: Min assist Sitting - Scoot to Edge of Bed: 5: Supervision Sit to Supine: 4: Min assist;4: Min guard Details for Bed Mobility Assistance: Min assist out of bed with support of LLE. Pt able to bring LLE over to edge of bed but required support with gravity controlled. Pt able to complete back into bed without any physical assistance. Transfers Transfers: Sit to Stand;Stand to Sit Sit to Stand: 4: Min guard;With upper extremity assist;From bed;From toilet Stand to Sit: 4: Min guard;With upper extremity assist;To toilet;To bed Details for Transfer Assistance: VC for safe transfer to/from RW Ambulation/Gait Ambulation/Gait Assistance: 4: Min guard Ambulation Distance (Feet): 200 Feet Assistive device: Rolling walker Ambulation/Gait Assistance Details: VC for safe distance to RW as well as proper quad strengthening during ambulation.   Gait Pattern: Step-to pattern;Decreased stride length;Decreased stance time - left Gait velocity: decreased General Gait Details: No buckling this session    Exercises Total Joint Exercises Heel Slides: AAROM;Strengthening;Left;10 reps;Seated Hip ABduction/ADduction: AROM;Strengthening;Left;10 reps;Supine Straight Leg Raises: AAROM;Strengthening;Left;10 reps;Supine Long Arc Quad: AAROM;Strengthening;Left;10 reps;Seated   PT Diagnosis:    PT Problem List:   PT Treatment Interventions:     PT Goals Acute Rehab PT Goals PT Goal: Supine/Side to Sit - Progress: Progressing toward goal PT Goal: Sit to Stand - Progress: Progressing toward goal PT Goal: Stand to Sit - Progress: Progressing toward goal PT Goal: Ambulate - Progress: Progressing toward goal PT Goal: Perform Home Exercise Program - Progress: Progressing toward goal  Visit Information  Last PT Received On: 10/11/11 Assistance Needed: +1    Subjective Data      Cognition  Overall Cognitive Status: Appears within functional limits for tasks assessed/performed Arousal/Alertness: Awake/alert Orientation Level: Appears intact for tasks assessed;Oriented X4 / Intact Behavior During Session: [facility] for tasks performed    Balance     End of Session PT - End of Session Equipment Utilized During Treatment: Gait belt Activity Tolerance: Patient tolerated treatment well Patient left: in bed;with call bell/phone within reach (in CPM 0-60) Nurse Communication: Mobility status   GP     Ambrose Finland 10/11/2011, 9:27 AM

## 2011-10-11 NOTE — Progress Notes (Signed)
CPM removed, pt c/o increased Foster.  Up to bathroom with assist.  Foster med administered.  Will continue to monitor and encourage. Harry Foster

## 2011-10-11 NOTE — Progress Notes (Signed)
Subjective: 2 Days Post-Op Procedure(s) (LRB): TOTAL KNEE ARTHROPLASTY (Left)  Activity level:  Has been out of bed walking with physical therapy Diet tolerance:  Eating well Voiding:  Voiding Patient reports pain as 2 on 0-10 scale.    Objective: Vital signs in last 24 hours: Temp:  [97.9 F (36.6 C)-99.3 F (37.4 C)] 97.9 F (36.6 C) (10/10 0517) Pulse Rate:  [71-85] 71  (10/10 0517) Resp:  [16-20] 18  (10/10 0517) BP: (105-135)/(64-80) 135/80 mmHg (10/10 0517) SpO2:  [92 %-99 %] 99 % (10/10 0517)  Labs:  Basename 10/11/11 0730 10/10/11 0640  HGB 12.7* 13.1    Basename 10/11/11 0730 10/10/11 0640  WBC 8.9 9.1  RBC 4.03* 4.17*  HCT 38.3* 39.9  PLT 147* 150    Basename 10/11/11 0730 10/10/11 0640  NA 138 137  K 4.9 4.8  CL 101 102  CO2 30 28  BUN 28* 26*  CREATININE 1.65* 1.73*  GLUCOSE 147* 129*  CALCIUM 9.2 9.1    Basename 10/11/11 0730 10/10/11 0640  LABPT -- --  INR 1.35 1.25    Physical Exam:  Neurologically intact ABD soft Neurovascular intact Sensation intact distally Intact pulses distally Dorsiflexion/Plantar flexion intact No cellulitis present Compartment soft Dressing changed and wound noted to be benign without sign of infection  Assessment/Plan:  2 Days Post-Op Procedure(s) (LRB): TOTAL KNEE ARTHROPLASTY (Left) Up with therapy Discharge to SNF hopefully okay to go today. We'll continue on Lovenox until INR is greater than 1.8. Pain seems to be well controlled on Percocet    Esker Dever R 10/11/2011, 8:23 AM

## 2011-10-11 NOTE — Progress Notes (Signed)
Occupational Therapy Treatment Patient Details Name: Harry Foster MRN: [id number] DOB: [birth year] Today's Date: 10/11/2011 Time: [id number] OT Time Calculation (min): 23 min  OT Assessment / Plan / Recommendation Comments on Treatment Session Pt. is doing much better today. He has his pain under control and was able to complete tub transfer with min (A). Pt. is safe to d/c to SNF today     Follow Up Recommendations  Skilled nursing facility    Barriers to Discharge       Equipment Recommendations  None recommended by OT    Recommendations for Other Services    Frequency Min 2X/week   Plan All goals met and education completed, patient discharged from OT services    Precautions / Restrictions Precautions Precautions: Knee Restrictions Weight Bearing Restrictions: Yes LLE Weight Bearing: Weight bearing as tolerated   Pertinent Vitals/Pain No pain reported by pt.     ADL  Grooming: Performed;Wash/dry hands;Modified independent Where Assessed - Grooming: Unsupported standing Lower Body Dressing: Simulated;Minimal assistance Where Assessed - Lower Body Dressing: Supported sit to stand Toilet Transfer: Chartered loss adjuster Method: Sit to Loss adjuster, chartered: Regular height toilet Toileting - Clothing Manipulation and Hygiene: Performed;Independent Where Assessed - Toileting Clothing Manipulation and Hygiene: Standing Tub/Shower Transfer: Performed;Min guard Clinical cytogeneticist Method: Therapist, art: IT consultant Used: Gait belt;Rolling walker Transfers/Ambulation Related to ADLs: Pt. able to verbalize and demonstrate safe use of RW and best hand placement for transfers ADL Comments: Pt. able to perform tub transfer with min (A) for support of LLE. Pt. requires min (A) for dressing LB and is able to transfer to toilet with supervision for safety.     OT Diagnosis:    OT Problem List:   OT  Treatment Interventions:     OT Goals Acute Rehab OT Goals OT Goal Formulation: With patient Time For Goal Achievement: 10/24/11 Potential to Achieve Goals: Good ADL Goals Pt Will Perform Lower Body Dressing: with min assist;Sit to stand from chair ADL Goal: Lower Body Dressing - Progress: Met Pt Will Transfer to Toilet: with supervision;Ambulation;3-in-1 ADL Goal: Toilet Transfer - Progress: Met Pt Will Perform Tub/Shower Transfer: Tub transfer;with min assist;Shower seat with back ADL Goal: Tub/Shower Transfer - Progress: Met  Visit Information  Last OT Received On: 10/11/11 Assistance Needed: +1    Subjective Data      Prior Functioning       Cognition  Overall Cognitive Status: Appears within functional limits for tasks assessed/performed Arousal/Alertness: Awake/alert Orientation Level: Appears intact for tasks assessed;Oriented X4 / Intact Behavior During Session: [facility] for tasks performed    Mobility  Shoulder Instructions Bed Mobility Bed Mobility: Supine to Sit Rolling Right: 6: Modified independent (Device/Increase time) Details for Bed Mobility Assistance: Pt. able to sit EOB  independently using RLE to support LLE over the edge of the bed.  Transfers Transfers: Sit to Stand;Stand to Sit Sit to Stand: 5: Supervision;From bed;From chair/3-in-1;From toilet;With upper extremity assist Stand to Sit: 5: Supervision;To chair/3-in-1;To toilet;With upper extremity assist Details for Transfer Assistance: Pt. verbalized safe hand placement during transfers.                  End of Session OT - End of Session Equipment Utilized During Treatment: Gait belt Activity Tolerance: Patient tolerated treatment well Patient left: in chair;with call bell/phone within reach;with family/visitor present Nurse Communication: Mobility status CPM Left Knee CPM Left Knee: Off  GO     Sharyn Creamer 10/11/2011, 2:37 PM

## 2011-10-11 NOTE — Progress Notes (Signed)
Occupational Therapy Discharge Patient Details Name: GEON CLAUDE MRN: [id number] DOB: 1945/06/02 Today's Date: 10/11/2011 Time: [id number] OT Time Calculation (min): 23 min  Patient discharged from OT services secondary to goals met and no further OT needs identified.  Please see latest therapy progress note for current level of functioning and progress toward goals.    Progress and discharge plan discussed with patient and/or caregiver: Patient/Caregiver agrees with plan  GO     Sharyn Creamer 10/11/2011, 2:38 PM

## 2011-10-11 NOTE — Discharge Summary (Signed)
Patient ID: ELVERT NANDA MRN: [id number] DOB/AGE: February 25, 1945 66 y.o.  Admit date: 10/09/2011 Discharge date: 10/11/2011  Admission Diagnoses:  Principal Problem:  *Left knee DJD   Discharge Diagnoses:  Same  Past Medical History  Diagnosis Date  . GI bleed 2010  . Hypertension   . Recurrent upper respiratory infection (URI)     completing treatment for sinus infection - with abx  . Arthritis     both knees and hands and left elbow  . Depression     treated for depression when wife was very ill  . GI bleed     hx  . Diverticulosis     hx - no sx  . Dysrhythmia     hx A fib, on anticoagulants    Surgeries: Procedure(s): TOTAL KNEE ARTHROPLASTY on 10/09/2011   Consultants:    Discharged Condition: Improved  Hospital Course: CADIEN ENSEY is an 66 y.o. male who was admitted 10/09/2011 for operative treatment ofLeft knee DJD. Patient has severe unremitting pain that affects sleep, daily activities, and work/hobbies. After pre-op clearance the patient was taken to the operating room on 10/09/2011 and underwent  Procedure(s): TOTAL KNEE ARTHROPLASTY.    Patient was given perioperative antibiotics: Anti-infectives     Start     Dose/Rate Route Frequency Ordered Stop   10/09/11 1400   ceFAZolin (ANCEF) IVPB 2 g/50 mL premix        2 g 100 mL/hr over 30 Minutes Intravenous Every 6 hours 10/09/11 1138 10/09/11 2107   10/09/11 0843   cefUROXime (ZINACEF) injection  Status:  Discontinued          As needed 10/09/11 0843 10/09/11 1044   10/08/11 1343   ceFAZolin (ANCEF) 3 g in dextrose 5 % 50 mL IVPB        3 g 160 mL/hr over 30 Minutes Intravenous 60 min pre-op 10/08/11 1343 10/09/11 0750   10/08/11 1343   ceFAZolin (ANCEF) IVPB 2 g/50 mL premix  Status:  Discontinued        2 g 100 mL/hr over 30 Minutes Intravenous 60 min pre-op 10/08/11 1343 10/08/11 1343           Patient was given sequential compression devices, early ambulation, and chemoprophylaxis to prevent  DVT.  Patient benefited maximally from hospital stay and there were no complications.    Recent vital signs: Patient Vitals for the past 24 hrs:  BP Temp Temp src Pulse Resp SpO2  10/11/11 0517 135/80 mmHg 97.9 F (36.6 C) Oral 71  18  99 %  2011-10-11 2333 - - - - 20  -  2011-10-11 2200 122/71 mmHg 99.3 F (37.4 C) Oral 78  16  96 %  2011-10-11 1801 131/77 mmHg - - 85  - -  October 11, 2011 1400 115/67 mmHg 98.1 F (36.7 C) - 75  16  92 %  Oct 11, 2011 0930 105/64 mmHg - - 78  - -     Recent laboratory studies:  Basename 10/11/11 0730 10/11/11 0640  WBC 8.9 9.1  HGB 12.7* 13.1  HCT 38.3* 39.9  PLT 147* 150  NA 138 137  K 4.9 4.8  CL 101 102  CO2 30 28  BUN 28* 26*  CREATININE 1.65* 1.73*  GLUCOSE 147* 129*  INR 1.35 1.25  CALCIUM 9.2 --     Discharge Medications:     Medication List     As of 10/11/2011  8:34 AM    STOP taking these medications  aspirin EC 81 MG tablet      meloxicam 15 MG tablet   Commonly known as: MOBIC      oxyCODONE 15 MG immediate release tablet   Commonly known as: ROXICODONE      TAKE these medications         ACIDOPHILUS/BIFIDUS PO   Take 100 mg by mouth daily.      amiodarone 200 MG tablet   Commonly known as: PACERONE   Take 200 mg by mouth daily.      ascorbic acid 1000 MG tablet   Commonly known as: VITAMIN C   Take 1,000 mg by mouth daily.      Co Q 10 100 MG Caps   Take 100 mg by mouth daily.      DHEA 50 MG Tabs   Take 1 tablet by mouth daily.      enoxaparin 30 MG/0.3ML injection   Commonly known as: LOVENOX   Inject 0.3 mLs (30 mg total) into the skin every 12 (twelve) hours.      magnesium gluconate 500 MG tablet   Commonly known as: MAGONATE   Take 500 mg by mouth daily.      methocarbamol 500 MG tablet   Commonly known as: ROBAXIN   Take 1 tablet (500 mg total) by mouth every 6 (six) hours as needed.      metoprolol succinate 50 MG 24 hr tablet   Commonly known as: TOPROL-XL   Take 25 mg by mouth 2 (two)  times daily. Take with or immediately following a meal.      multivitamin with minerals Tabs   Take 2 tablets by mouth daily.      OLIVE LEAF EXTRACT PO   Take 1 tablet by mouth daily.      olmesartan-hydrochlorothiazide 40-12.5 MG per tablet   Commonly known as: BENICAR HCT   Take 1 tablet by mouth daily.      omega-3 acid ethyl esters 1 G capsule   Commonly known as: LOVAZA   Take 2 g by mouth daily.      OVER THE COUNTER MEDICATION   Take 1 capsule by mouth daily. Berry extract      OVER THE COUNTER MEDICATION   Take 1 capsule by mouth daily. Vitamin d 5000 units w/ iodine 1021mcg      oxyCODONE-acetaminophen 10-325 MG per tablet   Commonly known as: PERCOCET   1 or 2 by mouth every 4-6      POMEGRANATE PO   Take 250 mg by mouth daily.      SAW PALMETTO PO   Take 500 mg by mouth daily.      triamcinolone 55 MCG/ACT nasal inhaler   Commonly known as: NASACORT   Place 1 spray into the nose daily as needed. For allergies      VITAMIN E PO   Take 360 mg by mouth daily.      warfarin 5 MG tablet   Commonly known as: COUMADIN   Take 7.5 mg by mouth daily.        Diagnostic Studies: No results found.  Disposition: 01-Home or Self Care      Discharge Orders    Future Orders Please Complete By Expires   Diet - low sodium heart healthy      Call MD / Call 911      Comments:   If you experience chest pain or shortness of breath, CALL 911 and be transported to the hospital emergency room.  If you develope a fever above 101 F, pus (white drainage) or increased drainage or redness at the wound, or calf pain, call your surgeon's office.   Constipation Prevention      Comments:   Drink plenty of fluids.  Prune juice may be helpful.  You may use a stool softener, such as Colace (over the counter) 100 mg twice a day.  Use MiraLax (over the counter) for constipation as needed.   Increase activity slowly as tolerated        may be weightbearing as tolerated left  side. Please change dressing every other day. Continue on Lovenox until INR is greater than 1.8.  Follow-up Information    Follow up with DALLDORF,PETER G, MD. Call in 2 weeks.   Contact information:   [place] [address] [phone]           Signed: Dione Housekeeper 10/11/2011, 8:34 AM

## 2011-10-11 NOTE — Progress Notes (Signed)
ANTICOAGULATION CONSULT NOTE - Follow Up Consult  Pharmacy Consult for Coumadin Indication: VTE prophylaxis  Allergies  Allergen Reactions  . Lisinopril Cough    Vital Signs: Temp: 97.9 F (36.6 C) (10/10 0517) Temp src: Oral (10/10 0517) BP: 135/80 mmHg (10/10 0517) Pulse Rate: 71  (10/10 0517)  Labs:  Basename 10/11/11 0730 10/10/11 0640 10/09/11 0552  HGB 12.7* 13.1 --  HCT 38.3* 39.9 --  PLT 147* 150 --  APTT -- -- --  LABPROT 16.4* 15.5* 14.1  INR 1.35 1.25 1.10  HEPARINUNFRC -- -- --  CREATININE 1.65* 1.73* --  CKTOTAL -- -- --  CKMB -- -- --  TROPONINI -- -- --    The CrCl is unknown because both a height and weight (above a minimum accepted value) are required for this calculation.  Assessment: 66 yo male on coumadin PTA for afib, s/p TKA POD #2.  INR is subtherapeutic but is trending up. Noted plans to continue Lovenox until INR>1.8.  Pt also on amiodarone. No bleeding noted, CBC about stable. Likely have not seen full effect of the higher doses given the last two days  Goal of Therapy:  INR 2-3  Plan:  1)  Coumadin 7.5 mg PO x1 today 2) Daily PT/INR  Rayna Sexton L 10/11/2011,3:12 PM

## 2011-10-12 ENCOUNTER — Encounter (HOSPITAL_COMMUNITY): Payer: Self-pay | Admitting: Orthopaedic Surgery

## 2012-10-02 ENCOUNTER — Ambulatory Visit (INDEPENDENT_AMBULATORY_CARE_PROVIDER_SITE_OTHER): Payer: Medicare Other | Admitting: Pharmacist

## 2012-10-02 DIAGNOSIS — I4891 Unspecified atrial fibrillation: Secondary | ICD-10-CM

## 2012-10-16 ENCOUNTER — Ambulatory Visit (INDEPENDENT_AMBULATORY_CARE_PROVIDER_SITE_OTHER): Payer: Medicare Other | Admitting: Pharmacist

## 2012-10-16 DIAGNOSIS — I4891 Unspecified atrial fibrillation: Secondary | ICD-10-CM

## 2012-10-16 LAB — POCT INR: INR: 3.4

## 2012-11-04 ENCOUNTER — Encounter: Payer: Self-pay | Admitting: Interventional Cardiology

## 2012-11-06 ENCOUNTER — Ambulatory Visit (INDEPENDENT_AMBULATORY_CARE_PROVIDER_SITE_OTHER): Payer: Medicare Other | Admitting: Pharmacist

## 2012-11-06 DIAGNOSIS — I4891 Unspecified atrial fibrillation: Secondary | ICD-10-CM

## 2012-11-06 LAB — POCT INR: INR: 2.6

## 2012-12-04 ENCOUNTER — Ambulatory Visit (INDEPENDENT_AMBULATORY_CARE_PROVIDER_SITE_OTHER): Payer: Medicare Other | Admitting: Pharmacist

## 2012-12-04 DIAGNOSIS — I4891 Unspecified atrial fibrillation: Secondary | ICD-10-CM

## 2013-01-15 ENCOUNTER — Ambulatory Visit (INDEPENDENT_AMBULATORY_CARE_PROVIDER_SITE_OTHER): Payer: Medicare Other | Admitting: Interventional Cardiology

## 2013-01-15 ENCOUNTER — Encounter: Payer: Self-pay | Admitting: Interventional Cardiology

## 2013-01-15 ENCOUNTER — Ambulatory Visit (INDEPENDENT_AMBULATORY_CARE_PROVIDER_SITE_OTHER): Payer: Medicare Other | Admitting: Pharmacist

## 2013-01-15 VITALS — BP 130/92 | HR 79 | Ht 71.0 in | Wt 285.0 lb

## 2013-01-15 DIAGNOSIS — Z7901 Long term (current) use of anticoagulants: Secondary | ICD-10-CM

## 2013-01-15 DIAGNOSIS — I4891 Unspecified atrial fibrillation: Secondary | ICD-10-CM

## 2013-01-15 DIAGNOSIS — E785 Hyperlipidemia, unspecified: Secondary | ICD-10-CM

## 2013-01-15 LAB — POCT INR: INR: 2.1

## 2013-01-15 NOTE — Progress Notes (Signed)
Patient ID: Harry Foster, male   DOB: 04/28/1945, 68 y.o.   MRN: [id number]    [address], [address] [place], [place]  [address] Phone: [phone] Fax:  [phone]  Date:  01/15/2013   ID:  Harry Foster, DOB 07/14/1945, MRN [id number]  PCP:  [name], MD   ASSESSMENT:  1. Atrial fibrillation status post conversion to normal sinus rhythm and maintenance of sinus rhythm off amiodarone 2. Chronic Coumadin therapy 3. Hypertension, under reasonable control 4. Hyperlipidemia   PLAN:  1. Continue long-term Coumadin therapy for stroke prevention 2. Monitor blood pressure intermittently and call if consistently above 140/90 3. Clinical followup in one year. Earlier if palpitations or sudden weakness/dyspnea that suggests recurrence of atrial fibrillation to   SUBJECTIVE: Harry Foster is a 68 y.o. male is asymptomatic with reference to palpitations, fatigue, and dyspnea. He has not had syncope. No transient neurological complaints or episodes of syncope overall he feels well. No bleeding complications on Coumadin.   Wt Readings from Last 3 Encounters:  01/15/13 285 lb (129.275 kg)  10/03/11 279 lb 12.2 oz (126.9 kg)  02/01/11 260 lb (117.935 kg)     Past Medical History  Diagnosis Date  . GI bleed 2010  . Hypertension   . Recurrent upper respiratory infection (URI)     completing treatment for sinus infection - with abx  . Arthritis     both knees and hands and left elbow  . Depression     treated for depression when wife was very ill  . GI bleed     hx  . Diverticulosis     hx - no sx  . Dysrhythmia     hx A fib, on anticoagulants    Current Outpatient Prescriptions  Medication Sig Dispense Refill  . ascorbic acid (VITAMIN C) 1000 MG tablet Take 1,000 mg by mouth daily.        . Coenzyme Q10 (CO Q 10) 100 MG CAPS Take 100 mg by mouth daily.      Marland Kitchen DHEA 50 MG TABS Take 1 tablet by mouth daily.        . Lactobacillus (ACIDOPHILUS/BIFIDUS PO) Take  100 mg by mouth daily.      . magnesium gluconate (MAGONATE) 500 MG tablet Take 500 mg by mouth daily.      . metoprolol succinate (TOPROL-XL) 50 MG 24 hr tablet Take 25 mg by mouth 2 (two) times daily. Take with or immediately following a meal.      . Multiple Vitamin (MULTIVITAMIN WITH MINERALS) TABS Take 2 tablets by mouth daily.      Marland Kitchen OLIVE LEAF EXTRACT PO Take 1 tablet by mouth daily.        Marland Kitchen olmesartan-hydrochlorothiazide (BENICAR HCT) 20-12.5 MG per tablet Take 1 tablet by mouth daily.      Marland Kitchen omega-3 acid ethyl esters (LOVAZA) 1 G capsule Take 2 g by mouth daily.       Marland Kitchen OVER THE COUNTER MEDICATION Take 1 capsule by mouth daily. Berry extract       . OVER THE COUNTER MEDICATION Take 1 capsule by mouth daily. Vitamin d 5000 units w/ iodine 1063mcg       . oxyCODONE-acetaminophen (PERCOCET) 10-325 MG per tablet 1 or 2 by mouth every 4-6  80 tablet  0  . Pomegranate, Punica granatum, (POMEGRANATE PO) Take 250 mg by mouth daily.       . Saw Palmetto, Serenoa repens, (SAW PALMETTO PO)  Take 500 mg by mouth daily.       Marland Kitchen triamcinolone (NASACORT) 55 MCG/ACT nasal inhaler Place 1 spray into the nose daily as needed. For allergies      . VITAMIN E PO Take 360 mg by mouth daily.       Marland Kitchen warfarin (COUMADIN) 5 MG tablet Take 10 mg by mouth daily.        No current facility-administered medications for this visit.    Allergies:    Allergies  Allergen Reactions  . Lisinopril Cough    Social History:  The patient  reports that he has never smoked. He has never used smokeless tobacco. He reports that he does not drink alcohol or use illicit drugs.   ROS:  Please see the history of present illness.      All other systems reviewed and negative.   OBJECTIVE: VS:  BP 130/92  Pulse 79  Ht 5\' 11"  (1.803 m)  Wt 285 lb (129.275 kg)  BMI 39.77 kg/m2 Well nourished, well developed, in no acute distress, obese  HEENT: normal Neck: JVD flat. Carotid bruit absent  Cardiac:  normal S1, S2; RRR; no  murmur Lungs:  clear to auscultation bilaterally, no wheezing, rhonchi or rales Abd: soft, nontender, no hepatomegaly Ext: Edema absent. Pulses 2+ bilateral  Skin: warm and dry Neuro:  CNs 2-12 intact, no focal abnormalities noted  EKG:  Normal sinus rhythm.       Signed, Illene Labrador III, MD 01/15/2013 3:02 PM  Past Medical History  DJD of the knees with left knee replacement   Borderline elevations of blood pressure   Obesity   Allergic rhinitis   Diverticulosis   Rotator cuff disease of both shoulders   Atrial fibrillation - Daneen Schick, MD - now in [facility]

## 2013-01-15 NOTE — Patient Instructions (Signed)
Your physician recommends that you continue on your current medications as directed. Please refer to the Current Medication list given to you today.  Your physician wants you to follow-up in: 1 year. You will receive a reminder letter in the mail two months in advance. If you don't receive a letter, please call our office to schedule the follow-up appointment.  

## 2013-01-16 ENCOUNTER — Other Ambulatory Visit: Payer: Self-pay | Admitting: Interventional Cardiology

## 2013-01-21 ENCOUNTER — Ambulatory Visit: Payer: Medicare Other | Admitting: Interventional Cardiology

## 2013-03-04 ENCOUNTER — Ambulatory Visit (INDEPENDENT_AMBULATORY_CARE_PROVIDER_SITE_OTHER): Payer: Medicare Other | Admitting: Pharmacist

## 2013-03-04 DIAGNOSIS — I4891 Unspecified atrial fibrillation: Secondary | ICD-10-CM

## 2013-03-04 LAB — POCT INR: INR: 2.8

## 2013-04-15 ENCOUNTER — Ambulatory Visit (INDEPENDENT_AMBULATORY_CARE_PROVIDER_SITE_OTHER): Payer: Medicare Other | Admitting: *Deleted

## 2013-04-15 DIAGNOSIS — Z5181 Encounter for therapeutic drug level monitoring: Secondary | ICD-10-CM | POA: Insufficient documentation

## 2013-04-15 DIAGNOSIS — I4891 Unspecified atrial fibrillation: Secondary | ICD-10-CM

## 2013-04-15 LAB — POCT INR: INR: 1.6

## 2013-04-16 ENCOUNTER — Other Ambulatory Visit: Payer: Self-pay | Admitting: Interventional Cardiology

## 2013-04-29 ENCOUNTER — Ambulatory Visit (INDEPENDENT_AMBULATORY_CARE_PROVIDER_SITE_OTHER): Payer: Medicare Other | Admitting: Pharmacist

## 2013-04-29 DIAGNOSIS — Z5181 Encounter for therapeutic drug level monitoring: Secondary | ICD-10-CM

## 2013-04-29 DIAGNOSIS — I4891 Unspecified atrial fibrillation: Secondary | ICD-10-CM

## 2013-04-29 LAB — POCT INR: INR: 3.8

## 2013-05-14 ENCOUNTER — Ambulatory Visit (INDEPENDENT_AMBULATORY_CARE_PROVIDER_SITE_OTHER): Payer: Medicare Other

## 2013-05-14 DIAGNOSIS — I4891 Unspecified atrial fibrillation: Secondary | ICD-10-CM

## 2013-05-14 DIAGNOSIS — Z5181 Encounter for therapeutic drug level monitoring: Secondary | ICD-10-CM

## 2013-05-14 LAB — POCT INR: INR: 3.5

## 2013-06-04 ENCOUNTER — Encounter (INDEPENDENT_AMBULATORY_CARE_PROVIDER_SITE_OTHER): Payer: Self-pay

## 2013-06-04 ENCOUNTER — Ambulatory Visit (INDEPENDENT_AMBULATORY_CARE_PROVIDER_SITE_OTHER): Payer: Medicare Other

## 2013-06-04 DIAGNOSIS — I4891 Unspecified atrial fibrillation: Secondary | ICD-10-CM

## 2013-06-04 DIAGNOSIS — Z5181 Encounter for therapeutic drug level monitoring: Secondary | ICD-10-CM

## 2013-06-04 LAB — POCT INR: INR: 2.8

## 2013-07-02 ENCOUNTER — Ambulatory Visit (INDEPENDENT_AMBULATORY_CARE_PROVIDER_SITE_OTHER): Payer: Medicare Other | Admitting: Pharmacist

## 2013-07-02 DIAGNOSIS — I4891 Unspecified atrial fibrillation: Secondary | ICD-10-CM

## 2013-07-02 DIAGNOSIS — Z5181 Encounter for therapeutic drug level monitoring: Secondary | ICD-10-CM

## 2013-07-02 LAB — POCT INR: INR: 3.1

## 2013-07-30 ENCOUNTER — Ambulatory Visit (INDEPENDENT_AMBULATORY_CARE_PROVIDER_SITE_OTHER): Payer: Medicare Other | Admitting: Pharmacist

## 2013-07-30 DIAGNOSIS — Z5181 Encounter for therapeutic drug level monitoring: Secondary | ICD-10-CM

## 2013-07-30 DIAGNOSIS — I4891 Unspecified atrial fibrillation: Secondary | ICD-10-CM

## 2013-07-30 LAB — POCT INR: INR: 2.8

## 2013-08-10 ENCOUNTER — Other Ambulatory Visit: Payer: Self-pay | Admitting: Interventional Cardiology

## 2013-08-18 ENCOUNTER — Other Ambulatory Visit: Payer: Self-pay | Admitting: Interventional Cardiology

## 2013-08-27 ENCOUNTER — Ambulatory Visit (INDEPENDENT_AMBULATORY_CARE_PROVIDER_SITE_OTHER): Payer: Medicare Other | Admitting: *Deleted

## 2013-08-27 DIAGNOSIS — I4891 Unspecified atrial fibrillation: Secondary | ICD-10-CM

## 2013-08-27 DIAGNOSIS — Z5181 Encounter for therapeutic drug level monitoring: Secondary | ICD-10-CM

## 2013-08-27 LAB — POCT INR: INR: 2.6

## 2013-09-10 ENCOUNTER — Other Ambulatory Visit: Payer: Self-pay | Admitting: Orthopaedic Surgery

## 2013-09-16 ENCOUNTER — Telehealth: Payer: Self-pay

## 2013-09-16 NOTE — Telephone Encounter (Signed)
called pt in response of letter pt dropped off. pt does not need to be seen by Dr.Smith prior to his right TKR scheduled for 10/6. pt adv to keep his cvrr appt for 9/24.unstructions will be provided on holdong his warfarin. pt verbalized understanding.

## 2013-09-24 ENCOUNTER — Ambulatory Visit (INDEPENDENT_AMBULATORY_CARE_PROVIDER_SITE_OTHER): Payer: Medicare Other | Admitting: Pharmacist

## 2013-09-24 DIAGNOSIS — Z5181 Encounter for therapeutic drug level monitoring: Secondary | ICD-10-CM

## 2013-09-24 DIAGNOSIS — I4891 Unspecified atrial fibrillation: Secondary | ICD-10-CM

## 2013-09-24 LAB — POCT INR: INR: 3.7

## 2013-09-25 ENCOUNTER — Encounter (HOSPITAL_COMMUNITY): Payer: Self-pay | Admitting: Pharmacy Technician

## 2013-09-26 NOTE — Pre-Procedure Instructions (Addendum)
Harry Foster  09/26/2013   Your procedure is scheduled on:  Tues, Oct 6 @ 7:30 AM  Report to [facility]  at 5:30 AM.  Call this number if you have problems the morning of surgery: [phone]   Remember:   Do not eat food or drink liquids after midnight.   Take these medicines the morning of surgery with A SIP OF WATER: Metoprolol(Toprol) and Pain Pill(if needed)              Stop taking your Coumadin as you have been instructed and also stop taking any Herbals,Fish Oil, or Vitamins. No Goody's,BC's,Aleve,or Ibuprofen     Do not wear jewelry  Do not wear lotions, powders, or colognes. You may wear deodorant.  Men may shave face and neck.  Do not bring valuables to the hospital.  [facility] is not responsible                  for any belongings or valuables.               Contacts, dentures or bridgework may not be worn into surgery.  Leave suitcase in the car. After surgery it may be brought to your room.  For patients admitted to the hospital, discharge time is determined by your                treatment team.                 Special Instructions:  [place] - Preparing for Surgery  Before surgery, you can play an important role.  Because skin is not sterile, your skin needs to be as free of germs as possible.  You can reduce the number of germs on you skin by washing with CHG (chlorahexidine gluconate) soap before surgery.  CHG is an antiseptic cleaner which kills germs and bonds with the skin to continue killing germs even after washing.  Please DO NOT use if you have an allergy to CHG or antibacterial soaps.  If your skin becomes reddened/irritated stop using the CHG and inform your nurse when you arrive at Short Stay.  Do not shave (including legs and underarms) for at least 48 hours prior to the first CHG shower.  You may shave your face.  Please follow these instructions carefully:   1.  Shower with CHG Soap the night before surgery and the                                 morning of Surgery.  2.  If you choose to wash your hair, wash your hair first as usual with your       normal shampoo.  3.  After you shampoo, rinse your hair and body thoroughly to remove the                      Shampoo.  4.  Use CHG as you would any other liquid soap.  You can apply chg directly       to the skin and wash gently with scrungie or a clean washcloth.  5.  Apply the CHG Soap to your body ONLY FROM THE NECK DOWN.        Do not use on open wounds or open sores.  Avoid contact with your eyes,       ears, mouth and genitals (private parts).  Wash genitals (  private parts)       with your normal soap.  6.  Wash thoroughly, paying special attention to the area where your surgery        will be performed.  7.  Thoroughly rinse your body with warm water from the neck down.  8.  DO NOT shower/wash with your normal soap after using and rinsing off       the CHG Soap.  9.  Pat yourself dry with a clean towel.            10.  Wear clean pajamas.            11.  Place clean sheets on your bed the night of your first shower and do not        sleep with pets.  Day of Surgery  Do not apply any lotions/deoderants the morning of surgery.  Please wear clean clothes to the hospital/surgery center.     Please read over the following fact sheets that you were given: Pain Booklet, Coughing and Deep Breathing, Blood Transfusion Information, MRSA Information and Surgical Site Infection Prevention

## 2013-09-28 ENCOUNTER — Encounter (HOSPITAL_COMMUNITY)
Admission: RE | Admit: 2013-09-28 | Discharge: 2013-09-28 | Disposition: A | Discharge status: Home / Self Care | Payer: Medicare Other | Source: Ambulatory Visit | Attending: Orthopaedic Surgery | Admitting: Orthopaedic Surgery

## 2013-09-28 ENCOUNTER — Ambulatory Visit (HOSPITAL_COMMUNITY)
Admission: RE | Admit: 2013-09-28 | Discharge: 2013-09-28 | Disposition: A | Discharge status: Home / Self Care | Payer: Medicare Other | Source: Ambulatory Visit | Attending: Orthopaedic Surgery | Admitting: Orthopaedic Surgery

## 2013-09-28 ENCOUNTER — Encounter (HOSPITAL_COMMUNITY): Payer: Self-pay

## 2013-09-28 DIAGNOSIS — IMO0002 Reserved for concepts with insufficient information to code with codable children: Secondary | ICD-10-CM | POA: Insufficient documentation

## 2013-09-28 DIAGNOSIS — Z01818 Encounter for other preprocedural examination: Secondary | ICD-10-CM | POA: Insufficient documentation

## 2013-09-28 DIAGNOSIS — M1710 Unilateral primary osteoarthritis, unspecified knee: Secondary | ICD-10-CM | POA: Insufficient documentation

## 2013-09-28 LAB — TYPE AND SCREEN
ABO/RH(D): O POS
Antibody Screen: NEGATIVE

## 2013-09-28 LAB — CBC WITH DIFFERENTIAL/PLATELET
BASOS ABS: 0 10*3/uL (ref 0.0–0.1)
BASOS PCT: 1 % (ref 0–1)
EOS ABS: 0.1 10*3/uL (ref 0.0–0.7)
Eosinophils Relative: 1 % (ref 0–5)
HCT: 47 % (ref 39.0–52.0)
Hemoglobin: 16.2 g/dL (ref 13.0–17.0)
Lymphocytes Relative: 27 % (ref 12–46)
Lymphs Abs: 1.7 10*3/uL (ref 0.7–4.0)
MCH: 32.2 pg (ref 26.0–34.0)
MCHC: 34.5 g/dL (ref 30.0–36.0)
MCV: 93.4 fL (ref 78.0–100.0)
Monocytes Absolute: 0.4 10*3/uL (ref 0.1–1.0)
Monocytes Relative: 7 % (ref 3–12)
NEUTROS ABS: 4.1 10*3/uL (ref 1.7–7.7)
NEUTROS PCT: 64 % (ref 43–77)
PLATELETS: 190 10*3/uL (ref 150–400)
RBC: 5.03 MIL/uL (ref 4.22–5.81)
RDW: 12.8 % (ref 11.5–15.5)
WBC: 6.3 10*3/uL (ref 4.0–10.5)

## 2013-09-28 LAB — BASIC METABOLIC PANEL
ANION GAP: 12 (ref 5–15)
BUN: 26 mg/dL — AB (ref 6–23)
CO2: 24 mEq/L (ref 19–32)
CREATININE: 1.66 mg/dL — AB (ref 0.50–1.35)
Calcium: 10 mg/dL (ref 8.4–10.5)
Chloride: 104 mEq/L (ref 96–112)
GFR calc Af Amer: 47 mL/min — ABNORMAL LOW (ref >90)
GFR, EST NON AFRICAN AMERICAN: 41 mL/min — AB (ref >90)
Glucose, Bld: 107 mg/dL — ABNORMAL HIGH (ref 70–99)
Potassium: 4.5 mEq/L (ref 3.7–5.3)
Sodium: 140 mEq/L (ref 137–147)

## 2013-09-28 LAB — URINALYSIS, ROUTINE W REFLEX MICROSCOPIC
BILIRUBIN URINE: NEGATIVE
GLUCOSE, UA: NEGATIVE mg/dL
HGB URINE DIPSTICK: NEGATIVE
KETONES UR: NEGATIVE mg/dL
Leukocytes, UA: NEGATIVE
Nitrite: NEGATIVE
PH: 5 (ref 5.0–8.0)
Protein, ur: NEGATIVE mg/dL
Specific Gravity, Urine: 1.02 (ref 1.005–1.030)
Urobilinogen, UA: 0.2 mg/dL (ref 0.0–1.0)

## 2013-09-28 LAB — SURGICAL PCR SCREEN
MRSA, PCR: NEGATIVE
Staphylococcus aureus: NEGATIVE

## 2013-09-28 LAB — PROTIME-INR
INR: 1.75 — AB (ref 0.00–1.49)
PROTHROMBIN TIME: 20.4 s — AB (ref 11.6–15.2)

## 2013-09-28 LAB — APTT: aPTT: 31 seconds (ref 24–37)

## 2013-09-28 NOTE — Progress Notes (Signed)
Pt. Knows to hold Warfarin & Aspirin, as October 1 , 2015

## 2013-09-28 NOTE — Progress Notes (Signed)
09/28/13 1038  OBSTRUCTIVE SLEEP APNEA  Have you ever been diagnosed with sleep apnea through a sleep study? No  Do you snore loudly (loud enough to be heard through closed doors)?  1  Do you often feel tired, fatigued, or sleepy during the daytime? 0  Has anyone observed you stop breathing during your sleep? 0  Do you have, or are you being treated for high blood pressure? 1  BMI more than 35 kg/m2? 1  Age over 68 years old? 1  Neck circumference greater than 40 cm/16 inches? 1  Gender: 1  Obstructive Sleep Apnea Score 6  Score 4 or greater  Results sent to PCP

## 2013-09-29 NOTE — Progress Notes (Signed)
Anesthesia Chart Review:  Patient is a 68 year old male scheduled for right TKA on 10/06/13 by Dr. Rhona Raider.  History includes afib (diagnosed 01/2011) s/p cardioversion 02/01/11, non-smoker, depression, HTN, GI bleed '10, arthritis, laser sinus surgery, diverticulosis, anal fistula, left TKA 10/2011. He has CKD based on lab trends since 2012. BMI is consistent with morbid obesity.  PCP is Dr. Josetta Huddle. Cardiologist is Dr. Daneen Schick last visit 01/15/13.  There is a note in Epic from Dr. Thompson Caul office stating that patient would not need to be seen by Dr. Tamala Keesling prior to right TKR.  He was seen at their [facility] on 09/24/13 and instructed to hold warfarin for 5 days prior to surgery.  Echo on 11/27/10 (Dr. Daneen Schick; scanned under Media tab, Encouted 10/09/11) showed mildly reduced LV function with EF 40-45%, moderate LVH, mild left atrial enlargement, moderate tricuspid regurgitation, mildly elevated estimated RV systolic pressure, mild mitral valve regurgitation, diastolic function difficult to evaluate in light of underlying atrial fibrillation, borderline right atrial enlargement.   EKG on 01/15/13 (done at Dr. Thompson Caul office) showed NSR, septal infarct (age undetermined).  CXR on 09/28/13 showed: No acute cardiopulmonary disease.  Preoperative labs noted. BUN/Cr 26/1.66, which appears stable since 11/2010. PT/INR 20.4/1.75, so I'll order a repeat PT/INR on the day of surgery.  PTT 31.  If follow-up labs are acceptable and otherwise no acute changes then I anticipate that he can proceed as planned.  [facility] Short Stay Center/Anesthesiology Phone [phone] 09/29/2013 5:55 PM

## 2013-10-02 NOTE — H&P (Signed)
TOTAL KNEE ADMISSION H&P  Patient is being admitted for right total knee arthroplasty.  Subjective:  Chief Complaint:right knee pain.  HPI: Harry Foster, 68 y.o. male, has a history of pain and functional disability in the right knee due to arthritis and has failed non-surgical conservative treatments for greater than 12 weeks to includeNSAID's and/or analgesics, corticosteriod injections, viscosupplementation injections, flexibility and strengthening excercises, supervised PT with diminished ADL's post treatment, use of assistive devices and weight reduction as appropriate.  Onset of symptoms was gradual, starting 6 years ago with gradually worsening course since that time. The patient noted no past surgery on the right knee(s).  Patient currently rates pain in the right knee(s) at 9 out of 10 with activity. Patient has night pain, worsening of pain with activity and weight bearing, pain that interferes with activities of daily living, pain with passive range of motion, crepitus and joint swelling.  Patient has evidence of subchondral sclerosis, periarticular osteophytes, joint subluxation and joint space narrowing by imaging studies. This patient has had no. There is no active infection.  Patient Active Problem List   Diagnosis Date Noted  . Left knee DJD 10/09/2011    Priority: High    Class: Chronic  . Encounter for therapeutic drug monitoring 04/15/2013  . Hyperlipidemia 01/15/2013  . Anticoagulation adequate 01/15/2013  . Atrial fibrillation, persistent 01/31/2011    Class: Acute  . Atrial fibrillation 11/21/2010    Class: Acute   Past Medical History  Diagnosis Date  . GI bleed 2010  . Hypertension   . Recurrent upper respiratory infection (URI)     completing treatment for sinus infection - with abx  . Depression     treated for depression when wife was very ill  . GI bleed     hx  . Diverticulosis     hx - no sx  . Dysrhythmia     hx A fib, on anticoagulants  .  Arthritis     both knees and hands and left elbow, hip    Past Surgical History  Procedure Laterality Date  . Sinus exploration  1992    laser sinus surgery  . Treatment fistula anal  1992  . Tonsillectomy      age 62  . Total knee arthroplasty  11/21/2010    Procedure: TOTAL KNEE ARTHROPLASTY;  Surgeon: Hessie Dibble, MD;  Location: [facility];  Service: Orthopedics;  Laterality: Left;  . Cardioversion  02/01/2011    Procedure: CARDIOVERSION;  Surgeon: Sinclair Grooms, MD;  Location: [facility];  Service: Cardiovascular;  Laterality: N/A;  . Total knee arthroplasty  10/09/2011    Procedure: TOTAL KNEE ARTHROPLASTY;  Surgeon: Hessie Dibble, MD;  Location: [facility];  Service: Orthopedics;  Laterality: Left;  left total knee arthroplasty    No prescriptions prior to admission   Allergies  Allergen Reactions  . Levaquin [Levofloxacin In D5w]     "Problems /w tendon & muscle tears"  . Lisinopril Cough    History  Substance Use Topics  . Smoking status: Never Smoker   . Smokeless tobacco: Never Used  . Alcohol Use: No    Family History  Problem Relation Age of Onset  . Heart disease Mother   . Heart disease Father      Review of Systems  Constitutional: Negative.   HENT: Negative.   Eyes: Negative.   Respiratory: Negative.   Cardiovascular: Positive for palpitations.  Gastrointestinal: Negative.   Genitourinary: Negative.   Musculoskeletal: Positive  for joint pain.  Skin: Negative.   Neurological: Negative.   Endo/Heme/Allergies: Negative.   Psychiatric/Behavioral: Negative.     Objective:  Physical Exam  Constitutional: He appears well-nourished.  HENT:  Head: Atraumatic.  Eyes: Conjunctivae are normal.  Neck: Neck supple.  Cardiovascular: Normal rate.   Respiratory: Effort normal.  GI: Bowel sounds are normal.  Musculoskeletal:  Right knee exam small effusion.  Crepitation 1+.  Motion 5-9 5 severe pain medial joint line.  Neurological: He is alert.  Skin: Skin  is warm.  Psychiatric: He has a normal mood and affect.    Vital signs in last 24 hours:    Labs:   Estimated body mass index is 39.77 kg/(m^2) as calculated from the following:   Height as of 01/15/13: 5\' 11"  (1.803 m).   Weight as of 01/15/13: 129.275 kg (285 lb).   Imaging Review Plain radiographs demonstrate severe degenerative joint disease of the right knee(s). The overall alignment isneutral. The bone quality appears to be good for age and reported activity level.  Assessment/Plan:  End stage arthritis, right knee   The patient history, physical examination, clinical judgment of the provider and imaging studies are consistent with end stage degenerative joint disease of the right knee(s) and total knee arthroplasty is deemed medically necessary. The treatment options including medical management, injection therapy arthroscopy and arthroplasty were discussed at length. The risks and benefits of total knee arthroplasty were presented and reviewed. The risks due to aseptic loosening, infection, stiffness, patella tracking problems, thromboembolic complications and other imponderables were discussed. The patient acknowledged the explanation, agreed to proceed with the plan and consent was signed. Patient is being admitted for inpatient treatment for surgery, pain control, PT, OT, prophylactic antibiotics, VTE prophylaxis, progressive ambulation and ADL's and discharge planning. The patient is planning to be discharged to skilled nursing facilityPostop anticoagulation will consist of Lovenox/Coumadin

## 2013-10-05 MED ORDER — DEXTROSE 5 % IV SOLN
3.0000 g | INTRAVENOUS | Status: AC
  Administered 2013-10-06: 3 g via INTRAVENOUS
  Filled 2013-10-05: qty 3000

## 2013-10-06 ENCOUNTER — Inpatient Hospital Stay (HOSPITAL_COMMUNITY): Payer: Medicare Other | Admitting: Anesthesiology

## 2013-10-06 ENCOUNTER — Encounter (HOSPITAL_COMMUNITY): Payer: Self-pay | Admitting: *Deleted

## 2013-10-06 ENCOUNTER — Encounter (HOSPITAL_COMMUNITY): Payer: Medicare Other | Admitting: Vascular Surgery

## 2013-10-06 ENCOUNTER — Inpatient Hospital Stay (HOSPITAL_COMMUNITY)
Admission: RE | Admit: 2013-10-06 | Discharge: 2013-10-08 | DRG: 470 | Disposition: A | Discharge status: Skilled Nursing Facility | Payer: Medicare Other | Source: Ambulatory Visit | Attending: Orthopaedic Surgery | Admitting: Orthopaedic Surgery

## 2013-10-06 ENCOUNTER — Encounter (HOSPITAL_COMMUNITY)
Admission: RE | Disposition: A | Discharge status: Skilled Nursing Facility | Payer: Self-pay | Source: Ambulatory Visit | Attending: Orthopaedic Surgery

## 2013-10-06 DIAGNOSIS — E669 Obesity, unspecified: Secondary | ICD-10-CM | POA: Diagnosis present

## 2013-10-06 DIAGNOSIS — I481 Persistent atrial fibrillation: Secondary | ICD-10-CM | POA: Diagnosis present

## 2013-10-06 DIAGNOSIS — I10 Essential (primary) hypertension: Secondary | ICD-10-CM | POA: Diagnosis present

## 2013-10-06 DIAGNOSIS — Z7901 Long term (current) use of anticoagulants: Secondary | ICD-10-CM

## 2013-10-06 DIAGNOSIS — Z79899 Other long term (current) drug therapy: Secondary | ICD-10-CM | POA: Diagnosis not present

## 2013-10-06 DIAGNOSIS — Z6841 Body Mass Index (BMI) 40.0 and over, adult: Secondary | ICD-10-CM

## 2013-10-06 DIAGNOSIS — M1711 Unilateral primary osteoarthritis, right knee: Principal | ICD-10-CM | POA: Diagnosis present

## 2013-10-06 DIAGNOSIS — M25561 Pain in right knee: Secondary | ICD-10-CM | POA: Diagnosis present

## 2013-10-06 DIAGNOSIS — Z7982 Long term (current) use of aspirin: Secondary | ICD-10-CM

## 2013-10-06 DIAGNOSIS — E785 Hyperlipidemia, unspecified: Secondary | ICD-10-CM | POA: Diagnosis present

## 2013-10-06 DIAGNOSIS — Z96652 Presence of left artificial knee joint: Secondary | ICD-10-CM | POA: Diagnosis present

## 2013-10-06 HISTORY — PX: TOTAL KNEE ARTHROPLASTY WITH REVISION COMPONENTS: SHX6198

## 2013-10-06 HISTORY — PX: TOTAL KNEE ARTHROPLASTY: SHX125

## 2013-10-06 LAB — PROTIME-INR
INR: 1.2 (ref 0.00–1.49)
Prothrombin Time: 15.2 seconds (ref 11.6–15.2)

## 2013-10-06 LAB — CBC
HEMATOCRIT: 44.9 % (ref 39.0–52.0)
HEMOGLOBIN: 14.9 g/dL (ref 13.0–17.0)
MCH: 31.2 pg (ref 26.0–34.0)
MCHC: 33.2 g/dL (ref 30.0–36.0)
MCV: 93.9 fL (ref 78.0–100.0)
Platelets: 170 10*3/uL (ref 150–400)
RBC: 4.78 MIL/uL (ref 4.22–5.81)
RDW: 12.8 % (ref 11.5–15.5)
WBC: 9.6 10*3/uL (ref 4.0–10.5)

## 2013-10-06 LAB — CREATININE, SERUM
Creatinine, Ser: 1.81 mg/dL — ABNORMAL HIGH (ref 0.50–1.35)
GFR calc Af Amer: 43 mL/min — ABNORMAL LOW (ref >90)
GFR calc non Af Amer: 37 mL/min — ABNORMAL LOW (ref >90)

## 2013-10-06 LAB — APTT: APTT: 28 s (ref 24–37)

## 2013-10-06 SURGERY — TOTAL KNEE ARTHROPLASTY WITH REVISION COMPONENTS
Anesthesia: General | Site: Knee | Laterality: Right

## 2013-10-06 MED ORDER — OXYCODONE HCL 5 MG PO TABS: 5.0000 mg | ORAL_TABLET | Freq: Once | ORAL | Status: DC | PRN

## 2013-10-06 MED ORDER — ACETAMINOPHEN 650 MG RE SUPP: 650.0000 mg | Freq: Four times a day (QID) | RECTAL | Status: DC | PRN

## 2013-10-06 MED ORDER — VITAMIN C 500 MG PO TABS
1000.0000 mg | ORAL_TABLET | Freq: Every day | ORAL | Status: DC
  Administered 2013-10-06 – 2013-10-08 (×3): 1000 mg via ORAL
  Filled 2013-10-06 (×3): qty 2

## 2013-10-06 MED ORDER — FENTANYL CITRATE 0.05 MG/ML IJ SOLN
INTRAMUSCULAR | Status: AC
  Filled 2013-10-06: qty 5

## 2013-10-06 MED ORDER — LIDOCAINE HCL (CARDIAC) 20 MG/ML IV SOLN
INTRAVENOUS | Status: DC | PRN
  Administered 2013-10-06: 30 mg via INTRAVENOUS

## 2013-10-06 MED ORDER — ACETAMINOPHEN 325 MG PO TABS: 650.0000 mg | ORAL_TABLET | Freq: Four times a day (QID) | ORAL | Status: DC | PRN

## 2013-10-06 MED ORDER — METOCLOPRAMIDE HCL 5 MG/ML IJ SOLN: 5.0000 mg | Freq: Three times a day (TID) | INTRAMUSCULAR | Status: DC | PRN

## 2013-10-06 MED ORDER — LIDOCAINE HCL (CARDIAC) 20 MG/ML IV SOLN
INTRAVENOUS | Status: AC
  Filled 2013-10-06: qty 5

## 2013-10-06 MED ORDER — LACTATED RINGERS IV SOLN: INTRAVENOUS | Status: DC

## 2013-10-06 MED ORDER — ASPIRIN EC 81 MG PO TBEC
81.0000 mg | DELAYED_RELEASE_TABLET | Freq: Every day | ORAL | Status: DC
Start: 2013-10-06 — End: 2013-10-08
  Administered 2013-10-06 – 2013-10-08 (×3): 81 mg via ORAL
  Filled 2013-10-06 (×3): qty 1

## 2013-10-06 MED ORDER — EPHEDRINE SULFATE 50 MG/ML IJ SOLN
INTRAMUSCULAR | Status: AC
  Filled 2013-10-06: qty 1

## 2013-10-06 MED ORDER — DHEA 50 MG PO TABS
50.0000 mg | ORAL_TABLET | Freq: Every day | ORAL | Status: DC
Start: 2013-10-06 — End: 2013-10-06

## 2013-10-06 MED ORDER — ONDANSETRON HCL 4 MG/2ML IJ SOLN: 4.0000 mg | Freq: Four times a day (QID) | INTRAMUSCULAR | Status: DC | PRN

## 2013-10-06 MED ORDER — PROPOFOL 10 MG/ML IV BOLUS
INTRAVENOUS | Status: AC
  Filled 2013-10-06: qty 20

## 2013-10-06 MED ORDER — BUPIVACAINE-EPINEPHRINE (PF) 0.5% -1:200000 IJ SOLN
INTRAMUSCULAR | Status: DC | PRN
  Administered 2013-10-06: 25 mL via PERINEURAL

## 2013-10-06 MED ORDER — TRANEXAMIC ACID 100 MG/ML IV SOLN
2000.0000 mg | INTRAVENOUS | Status: DC | PRN
Start: 2013-10-06 — End: 2013-10-06
  Administered 2013-10-06: 2000 mg via INTRAVENOUS

## 2013-10-06 MED ORDER — ONDANSETRON HCL 4 MG/2ML IJ SOLN
INTRAMUSCULAR | Status: DC | PRN
  Administered 2013-10-06: 4 mg via INTRAVENOUS

## 2013-10-06 MED ORDER — ONDANSETRON HCL 4 MG/2ML IJ SOLN
INTRAMUSCULAR | Status: AC
  Filled 2013-10-06: qty 2

## 2013-10-06 MED ORDER — DOCUSATE SODIUM 100 MG PO CAPS
100.0000 mg | ORAL_CAPSULE | Freq: Two times a day (BID) | ORAL | Status: DC
  Administered 2013-10-06 – 2013-10-08 (×5): 100 mg via ORAL
  Filled 2013-10-06 (×6): qty 1

## 2013-10-06 MED ORDER — HYDROCHLOROTHIAZIDE 12.5 MG PO CAPS
12.5000 mg | ORAL_CAPSULE | Freq: Every day | ORAL | Status: DC
  Administered 2013-10-07 – 2013-10-08 (×2): 12.5 mg via ORAL
  Filled 2013-10-06 (×3): qty 1

## 2013-10-06 MED ORDER — MIDAZOLAM HCL 2 MG/2ML IJ SOLN
INTRAMUSCULAR | Status: AC
  Filled 2013-10-06: qty 2

## 2013-10-06 MED ORDER — BISACODYL 5 MG PO TBEC: 5.0000 mg | DELAYED_RELEASE_TABLET | Freq: Every day | ORAL | Status: DC | PRN

## 2013-10-06 MED ORDER — IRBESARTAN 150 MG PO TABS
150.0000 mg | ORAL_TABLET | Freq: Every day | ORAL | Status: DC
  Administered 2013-10-07 – 2013-10-08 (×2): 150 mg via ORAL
  Filled 2013-10-06 (×3): qty 1

## 2013-10-06 MED ORDER — CO Q 10 100 MG PO CAPS: 100.0000 mg | ORAL_CAPSULE | Freq: Every day | ORAL | Status: DC

## 2013-10-06 MED ORDER — ASPIRIN 81 MG PO TBEC
81.0000 mg | DELAYED_RELEASE_TABLET | Freq: Every day | ORAL | Status: DC
Start: 2013-10-06 — End: 2013-10-06

## 2013-10-06 MED ORDER — ONDANSETRON HCL 4 MG PO TABS: 4.0000 mg | ORAL_TABLET | Freq: Four times a day (QID) | ORAL | Status: DC | PRN

## 2013-10-06 MED ORDER — SODIUM CHLORIDE 0.9 % IJ SOLN
INTRAMUSCULAR | Status: AC
  Filled 2013-10-06: qty 10

## 2013-10-06 MED ORDER — LACTATED RINGERS IV SOLN
INTRAVENOUS | Status: DC | PRN
  Administered 2013-10-06 (×2): via INTRAVENOUS

## 2013-10-06 MED ORDER — OXYCODONE HCL 5 MG PO TABS
10.0000 mg | ORAL_TABLET | ORAL | Status: DC | PRN
  Administered 2013-10-06: 20 mg via ORAL
  Administered 2013-10-06 – 2013-10-08 (×8): 10 mg via ORAL
  Filled 2013-10-06 (×5): qty 2
  Filled 2013-10-06: qty 4
  Filled 2013-10-06 (×3): qty 2

## 2013-10-06 MED ORDER — MENTHOL 3 MG MT LOZG: 1.0000 | LOZENGE | OROMUCOSAL | Status: DC | PRN

## 2013-10-06 MED ORDER — ADULT MULTIVITAMIN W/MINERALS CH: 2.0000 | ORAL_TABLET | Freq: Every day | ORAL | Status: DC

## 2013-10-06 MED ORDER — CHLORHEXIDINE GLUCONATE 4 % EX LIQD
60.0000 mL | Freq: Once | CUTANEOUS | Status: DC
  Filled 2013-10-06: qty 60

## 2013-10-06 MED ORDER — ALUM & MAG HYDROXIDE-SIMETH 200-200-20 MG/5ML PO SUSP: 30.0000 mL | ORAL | Status: DC | PRN

## 2013-10-06 MED ORDER — OXYCODONE HCL 5 MG/5ML PO SOLN: 5.0000 mg | Freq: Once | ORAL | Status: DC | PRN

## 2013-10-06 MED ORDER — WARFARIN SODIUM 10 MG PO TABS
10.0000 mg | ORAL_TABLET | Freq: Once | ORAL | Status: AC
  Administered 2013-10-06: 10 mg via ORAL
  Filled 2013-10-06: qty 1

## 2013-10-06 MED ORDER — HYDROMORPHONE HCL 1 MG/ML IJ SOLN: 0.5000 mg | INTRAMUSCULAR | Status: DC | PRN

## 2013-10-06 MED ORDER — EPHEDRINE SULFATE 50 MG/ML IJ SOLN
INTRAMUSCULAR | Status: DC | PRN
  Administered 2013-10-06: 10 mg via INTRAVENOUS

## 2013-10-06 MED ORDER — HYDROMORPHONE HCL 1 MG/ML IJ SOLN
0.2500 mg | INTRAMUSCULAR | Status: DC | PRN
  Administered 2013-10-06 (×4): 0.5 mg via INTRAVENOUS

## 2013-10-06 MED ORDER — SODIUM CHLORIDE 0.9 % IR SOLN
Status: DC | PRN
  Administered 2013-10-06: 3000 mL

## 2013-10-06 MED ORDER — ROCURONIUM BROMIDE 50 MG/5ML IV SOLN
INTRAVENOUS | Status: AC
  Filled 2013-10-06: qty 1

## 2013-10-06 MED ORDER — SUCCINYLCHOLINE CHLORIDE 20 MG/ML IJ SOLN
INTRAMUSCULAR | Status: AC
  Filled 2013-10-06: qty 1

## 2013-10-06 MED ORDER — METOCLOPRAMIDE HCL 10 MG PO TABS: 5.0000 mg | ORAL_TABLET | Freq: Three times a day (TID) | ORAL | Status: DC | PRN

## 2013-10-06 MED ORDER — ADULT MULTIVITAMIN W/MINERALS CH
2.0000 | ORAL_TABLET | Freq: Every day | ORAL | Status: DC
  Administered 2013-10-06 – 2013-10-08 (×3): 2 via ORAL
  Filled 2013-10-06 (×3): qty 2

## 2013-10-06 MED ORDER — BUPIVACAINE LIPOSOME 1.3 % IJ SUSP
INTRAMUSCULAR | Status: DC | PRN
  Administered 2013-10-06: 20 mL

## 2013-10-06 MED ORDER — DEXTROSE 5 % IV SOLN
500.0000 mg | Freq: Four times a day (QID) | INTRAVENOUS | Status: DC | PRN
  Filled 2013-10-06: qty 5

## 2013-10-06 MED ORDER — METOPROLOL SUCCINATE ER 25 MG PO TB24
25.0000 mg | ORAL_TABLET | Freq: Two times a day (BID) | ORAL | Status: DC
  Administered 2013-10-06 – 2013-10-08 (×4): 25 mg via ORAL
  Filled 2013-10-06 (×6): qty 1

## 2013-10-06 MED ORDER — MIDAZOLAM HCL 5 MG/5ML IJ SOLN
INTRAMUSCULAR | Status: DC | PRN
  Administered 2013-10-06: 2 mg via INTRAVENOUS

## 2013-10-06 MED ORDER — HYDROMORPHONE HCL 1 MG/ML IJ SOLN
INTRAMUSCULAR | Status: AC
  Administered 2013-10-06: 11:00:00
  Filled 2013-10-06: qty 1

## 2013-10-06 MED ORDER — FENTANYL CITRATE 0.05 MG/ML IJ SOLN
INTRAMUSCULAR | Status: DC | PRN
  Administered 2013-10-06 (×5): 50 ug via INTRAVENOUS

## 2013-10-06 MED ORDER — PROPOFOL 10 MG/ML IV BOLUS
INTRAVENOUS | Status: DC | PRN
  Administered 2013-10-06: 70 mg via INTRAVENOUS
  Administered 2013-10-06: 200 mg via INTRAVENOUS

## 2013-10-06 MED ORDER — WARFARIN - PHARMACIST DOSING INPATIENT: Freq: Every day | Status: DC

## 2013-10-06 MED ORDER — OMEGA-3-ACID ETHYL ESTERS 1 G PO CAPS
2.0000 g | ORAL_CAPSULE | Freq: Every day | ORAL | Status: DC
  Administered 2013-10-07 – 2013-10-08 (×2): 2 g via ORAL
  Filled 2013-10-06 (×3): qty 2

## 2013-10-06 MED ORDER — MAGNESIUM GLUCONATE 500 MG PO TABS
500.0000 mg | ORAL_TABLET | Freq: Every day | ORAL | Status: DC
  Administered 2013-10-06 – 2013-10-08 (×3): 500 mg via ORAL
  Filled 2013-10-06 (×3): qty 1

## 2013-10-06 MED ORDER — ONDANSETRON HCL 4 MG/2ML IJ SOLN: 4.0000 mg | Freq: Once | INTRAMUSCULAR | Status: DC | PRN

## 2013-10-06 MED ORDER — HYDROMORPHONE HCL 1 MG/ML IJ SOLN
INTRAMUSCULAR | Status: AC
  Administered 2013-10-06: 10:00:00
  Filled 2013-10-06: qty 1

## 2013-10-06 MED ORDER — PHENOL 1.4 % MT LIQD: 1.0000 | OROMUCOSAL | Status: DC | PRN

## 2013-10-06 MED ORDER — ENOXAPARIN SODIUM 30 MG/0.3ML ~~LOC~~ SOLN
30.0000 mg | Freq: Two times a day (BID) | SUBCUTANEOUS | Status: DC
  Administered 2013-10-07 – 2013-10-08 (×3): 30 mg via SUBCUTANEOUS
  Filled 2013-10-06 (×5): qty 0.3

## 2013-10-06 MED ORDER — CEFAZOLIN SODIUM 10 G IJ SOLR
3.0000 g | Freq: Four times a day (QID) | INTRAMUSCULAR | Status: AC
  Administered 2013-10-06 (×2): 3 g via INTRAVENOUS
  Filled 2013-10-06 (×3): qty 3000

## 2013-10-06 MED ORDER — PHENYLEPHRINE HCL 10 MG/ML IJ SOLN
INTRAMUSCULAR | Status: DC | PRN
  Administered 2013-10-06 (×3): 80 ug via INTRAVENOUS

## 2013-10-06 MED ORDER — METHOCARBAMOL 500 MG PO TABS
500.0000 mg | ORAL_TABLET | Freq: Four times a day (QID) | ORAL | Status: DC | PRN
  Administered 2013-10-06 – 2013-10-08 (×5): 500 mg via ORAL
  Filled 2013-10-06 (×6): qty 1

## 2013-10-06 MED ORDER — OLMESARTAN MEDOXOMIL-HCTZ 20-12.5 MG PO TABS: 1.0000 | ORAL_TABLET | Freq: Every day | ORAL | Status: DC

## 2013-10-06 MED ORDER — SODIUM CHLORIDE 0.9 % IJ SOLN
INTRAMUSCULAR | Status: DC | PRN
  Administered 2013-10-06: 20 mL via INTRAVENOUS

## 2013-10-06 MED ORDER — DIPHENHYDRAMINE HCL 12.5 MG/5ML PO ELIX: 12.5000 mg | ORAL_SOLUTION | ORAL | Status: DC | PRN

## 2013-10-06 MED ORDER — BUPIVACAINE LIPOSOME 1.3 % IJ SUSP
20.0000 mL | INTRAMUSCULAR | Status: DC
  Filled 2013-10-06: qty 20

## 2013-10-06 MED ORDER — TRANEXAMIC ACID 100 MG/ML IV SOLN
2000.0000 mg | INTRAVENOUS | Status: DC
  Filled 2013-10-06: qty 20

## 2013-10-06 SURGICAL SUPPLY — 73 items
APL SKNCLS STERI-STRIP NONHPOA (GAUZE/BANDAGES/DRESSINGS) ×2
BANDAGE ELASTIC 4 VELCRO ST LF (GAUZE/BANDAGES/DRESSINGS) ×4 IMPLANT
BANDAGE ELASTIC 6 VELCRO ST LF (GAUZE/BANDAGES/DRESSINGS) ×3 IMPLANT
BANDAGE ESMARK 6X9 LF (GAUZE/BANDAGES/DRESSINGS) ×2 IMPLANT
BENZOIN TINCTURE PRP APPL 2/3 (GAUZE/BANDAGES/DRESSINGS) ×3 IMPLANT
BLADE SAG 18X100X1.27 (BLADE) IMPLANT
BLADE SAGITTAL 13X1.27X60 (BLADE) IMPLANT
BLADE SAGITTAL 13X1.27X60MM (BLADE)
BLADE SAGITTAL 25.0X1.19X90 (BLADE) IMPLANT
BLADE SAGITTAL 25.0X1.19X90MM (BLADE)
BLADE SURG ROTATE 9660 (MISCELLANEOUS) IMPLANT
BNDG CMPR 9X6 STRL LF SNTH (GAUZE/BANDAGES/DRESSINGS) ×2
BNDG CMPR MED 10X6 ELC LF (GAUZE/BANDAGES/DRESSINGS) ×2
BNDG ELASTIC 6X10 VLCR STRL LF (GAUZE/BANDAGES/DRESSINGS) ×4 IMPLANT
BNDG ESMARK 6X9 LF (GAUZE/BANDAGES/DRESSINGS) ×4
BNDG GAUZE ELAST 4 BULKY (GAUZE/BANDAGES/DRESSINGS) ×5 IMPLANT
BOWL SMART MIX CTS (DISPOSABLE) ×4 IMPLANT
CAP UPCHARGE REVISION TRAY ×3 IMPLANT
CAPT RP KNEE ×3 IMPLANT
CEMENT HV SMART SET (Cement) ×8 IMPLANT
CLOSURE STERI-STRIP 1/2X4 (GAUZE/BANDAGES/DRESSINGS) ×1
CLSR STERI-STRIP ANTIMIC 1/2X4 (GAUZE/BANDAGES/DRESSINGS) ×2 IMPLANT
COVER SURGICAL LIGHT HANDLE (MISCELLANEOUS) ×4 IMPLANT
CUFF TOURNIQUET SINGLE 34IN LL (TOURNIQUET CUFF) ×4 IMPLANT
CUFF TOURNIQUET SINGLE 44IN (TOURNIQUET CUFF) IMPLANT
DRAPE EXTREMITY T 121X128X90 (DRAPE) ×4 IMPLANT
DRAPE PROXIMA HALF (DRAPES) ×4 IMPLANT
DRAPE U-SHAPE 47X51 STRL (DRAPES) ×4 IMPLANT
DRSG ADAPTIC 3X8 NADH LF (GAUZE/BANDAGES/DRESSINGS) ×4 IMPLANT
DRSG PAD ABDOMINAL 8X10 ST (GAUZE/BANDAGES/DRESSINGS) ×4 IMPLANT
DURAPREP 26ML APPLICATOR (WOUND CARE) ×4 IMPLANT
ELECT REM PT RETURN 9FT ADLT (ELECTROSURGICAL) ×4
ELECTRODE REM PT RTRN 9FT ADLT (ELECTROSURGICAL) ×2 IMPLANT
GAUZE SPONGE 4X4 12PLY STRL (GAUZE/BANDAGES/DRESSINGS) ×4 IMPLANT
GLOVE BIO SURGEON STRL SZ7 (GLOVE) ×3 IMPLANT
GLOVE BIO SURGEON STRL SZ8 (GLOVE) ×8 IMPLANT
GLOVE BIOGEL PI IND STRL 7.0 (GLOVE) ×1 IMPLANT
GLOVE BIOGEL PI IND STRL 8 (GLOVE) ×4 IMPLANT
GLOVE BIOGEL PI INDICATOR 7.0 (GLOVE) ×2
GLOVE BIOGEL PI INDICATOR 8 (GLOVE) ×4
GLOVE SURG SS PI 7.0 STRL IVOR (GLOVE) ×3 IMPLANT
GOWN STRL REUS W/ TWL LRG LVL3 (GOWN DISPOSABLE) ×3 IMPLANT
GOWN STRL REUS W/ TWL XL LVL3 (GOWN DISPOSABLE) ×4 IMPLANT
GOWN STRL REUS W/TWL LRG LVL3 (GOWN DISPOSABLE) ×8
GOWN STRL REUS W/TWL XL LVL3 (GOWN DISPOSABLE) ×8
HANDPIECE INTERPULSE COAX TIP (DISPOSABLE) ×4
HOOD PEEL AWAY FACE SHEILD DIS (HOOD) ×8 IMPLANT
IMMOBILIZER KNEE 20 (SOFTGOODS) IMPLANT
IMMOBILIZER KNEE 22 UNIV (SOFTGOODS) ×4 IMPLANT
IMMOBILIZER KNEE 24 THIGH 36 (MISCELLANEOUS) IMPLANT
IMMOBILIZER KNEE 24 UNIV (MISCELLANEOUS)
KIT BASIN OR (CUSTOM PROCEDURE TRAY) ×4 IMPLANT
KIT ROOM TURNOVER OR (KITS) ×4 IMPLANT
MANIFOLD NEPTUNE II (INSTRUMENTS) ×4 IMPLANT
NDL HYPO 21X1 ECLIPSE (NEEDLE) ×1 IMPLANT
NEEDLE HYPO 21X1 ECLIPSE (NEEDLE) ×4 IMPLANT
NS IRRIG 1000ML POUR BTL (IV SOLUTION) ×4 IMPLANT
PACK TOTAL JOINT (CUSTOM PROCEDURE TRAY) ×4 IMPLANT
PAD ARMBOARD 7.5X6 YLW CONV (MISCELLANEOUS) ×8 IMPLANT
SET HNDPC FAN SPRY TIP SCT (DISPOSABLE) ×2 IMPLANT
STAPLER VISISTAT 35W (STAPLE) IMPLANT
SUCTION FRAZIER TIP 10 FR DISP (SUCTIONS) IMPLANT
SUT MNCRL AB 3-0 PS2 18 (SUTURE) IMPLANT
SUT VIC AB 0 CT1 27 (SUTURE) ×8
SUT VIC AB 0 CT1 27XBRD ANBCTR (SUTURE) ×4 IMPLANT
SUT VIC AB 2-0 CT1 27 (SUTURE) ×8
SUT VIC AB 2-0 CT1 TAPERPNT 27 (SUTURE) ×4 IMPLANT
SUT VLOC 180 0 24IN GS25 (SUTURE) ×4 IMPLANT
SYR 50ML LL SCALE MARK (SYRINGE) ×4 IMPLANT
TOWEL OR 17X24 6PK STRL BLUE (TOWEL DISPOSABLE) ×4 IMPLANT
TOWEL OR 17X26 10 PK STRL BLUE (TOWEL DISPOSABLE) ×4 IMPLANT
TRAY FOLEY CATH 14FR (SET/KITS/TRAYS/PACK) IMPLANT
WATER STERILE IRR 1000ML POUR (IV SOLUTION) ×8 IMPLANT

## 2013-10-06 NOTE — Progress Notes (Signed)
Utilization review completed.  

## 2013-10-06 NOTE — Evaluation (Signed)
Physical Therapy Evaluation Patient Details Name: Harry Foster MRN: [id number] DOB: 09/15/1945 Today's Date: 10/06/2013   History of Present Illness  68 y.o. male admitted to [facility] on 10/06/13 for elective R TKA.  Pt with significant PMHx of HTN, depression, h/o A-fib s/p cardioversion, and L TKA.    Clinical Impression  Pt is POD #0 s/p R TKA and is moving very well with min guard assist and RW.  He can even already do a SLR with low extension lag in the bed.  Pt has pre arranged to go to [facility] at discharge due to living alone and no family support.   PT to follow acutely for deficits listed below.       Follow Up Recommendations Other (comment) (pre arranged [facility] SNF)    Equipment Recommendations  None recommended by PT    Recommendations for Other Services   NA    Precautions / Restrictions Precautions Precautions: Knee Precaution Booklet Issued: Yes (comment) Precaution Comments: knee handout provided, reviewed WBAT status, KI use, and no pillow under R knee Required Braces or Orthoses: Knee Immobilizer - Right Knee Immobilizer - Right: On at all times Restrictions Weight Bearing Restrictions: Yes RLE Weight Bearing: Weight bearing as tolerated      Mobility  Bed Mobility Overal bed mobility: Modified Independent             General bed mobility comments: pt using his left leg to move his right leg and heavy reliance on bed rails to get EOB.   Transfers Overall transfer level: Needs assistance Equipment used: Rolling walker (2 wheeled) Transfers: Sit to/from Stand Sit to Stand: Supervision         General transfer comment: supervision for safety due to first time up  Ambulation/Gait Ambulation/Gait assistance: Min guard Ambulation Distance (Feet): 10 Feet Assistive device: Rolling walker (2 wheeled) Gait Pattern/deviations: Step-to pattern;Antalgic Gait velocity: decreased Gait velocity interpretation: Below normal speed for  age/gender General Gait Details: pt with antalgic gait pattern.  Verbal cues for LE sequencing during gait and upright posture.              Balance Overall balance assessment: Needs assistance Sitting-balance support: Feet supported;No upper extremity supported Sitting balance-Leahy Scale: Good     Standing balance support: No upper extremity supported;Bilateral upper extremity supported Standing balance-Leahy Scale: Fair                               Pertinent Vitals/Pain Pain Assessment: 0-10 Pain Score: 5  Pain Location: knee, right Pain Descriptors / Indicators: Aching;Burning Pain Intervention(s): Limited activity within patient's tolerance;Monitored during session;Repositioned    Home Living Family/patient expects to be discharged to:: Skilled nursing facility (pre arranged [facility]) Living Arrangements: Alone (he is widowed)                    Prior Function Level of Independence: Independent with assistive device(s)         Comments: started using a cane PTA,         Extremity/Trunk Assessment   Upper Extremity Assessment: Defer to OT evaluation           Lower Extremity Assessment: RLE deficits/detail RLE Deficits / Details: right leg with normal post op pain and weakness.  Pt with 3/5 ankle, 2+/5 knee, 3/5 hip flexion    Cervical / Trunk Assessment: Normal  Communication   Communication: No difficulties  Cognition Arousal/Alertness: Awake/alert Behavior During Therapy: WFL for tasks assessed/performed Overall Cognitive Status: Within Functional Limits for tasks assessed                      General Comments General comments (skin integrity, edema, etc.): HR and O2 sats stable on RA, so left O2 off at end of session.  RN made aware    Exercises Total Joint Exercises Ankle Circles/Pumps: AROM;Both;20 reps;Seated      Assessment/Plan    PT Assessment Patient needs continued PT services  PT Diagnosis  Difficulty walking;Abnormality of gait;Generalized weakness;Acute pain   PT Problem List Decreased strength;Decreased range of motion;Decreased activity tolerance;Decreased balance;Decreased mobility;Decreased knowledge of use of DME;Decreased knowledge of precautions;Pain  PT Treatment Interventions DME instruction;Gait training;Stair training;Functional mobility training;Therapeutic activities;Therapeutic exercise;Balance training;Neuromuscular re-education;Patient/family education;Manual techniques;Modalities   PT Goals (Current goals can be found in the Care Plan section) Acute Rehab PT Goals Patient Stated Goal: to do better with this knee than he did with his last PT Goal Formulation: With patient Time For Goal Achievement: 10/13/13 Potential to Achieve Goals: Good    Frequency 7X/week   Barriers to discharge Decreased caregiver support pt is a widower and lives alone, doesn't have any family support. Went to SNF after his last TKA for increased independence before returning home.        End of Session Equipment Utilized During Treatment: Right knee immobilizer Activity Tolerance: Patient limited by pain Patient left: in chair;with call bell/phone within reach Nurse Communication: Mobility status         Time: [id number] PT Time Calculation (min): 29 min   Charges:   PT Evaluation $Initial PT Evaluation Tier I: 1 Procedure PT Treatments $Therapeutic Activity: 8-22 mins        Arlis Everly B. [place], [place], DPT [phone]   10/06/2013, 4:50 PM

## 2013-10-06 NOTE — Interval H&P Note (Signed)
History and Physical Interval Note:  10/06/2013 7:26 AM  Harry Foster  has presented today for surgery, with the diagnosis of RIGHT KNEE DEGENERATIVE JOINT DISEASE  The various methods of treatment have been discussed with the patient and family. After consideration of risks, benefits and other options for treatment, the patient has consented to  Procedure(s): TOTAL KNEE ARTHROPLASTY (Right) as a surgical intervention .  The patient's history has been reviewed, patient examined, no change in status, stable for surgery.  I have reviewed the patient's chart and labs.  Questions were answered to the patient's satisfaction.     Delbert Darley G

## 2013-10-06 NOTE — Anesthesia Procedure Notes (Signed)
Anesthesia Regional Block:  Adductor canal block  Pre-Anesthetic Checklist: ,, timeout performed, Correct Patient, Correct Site, Correct Laterality, Correct Procedure, Correct Position, site marked, Risks and benefits discussed,  Surgical consent,  Pre-op evaluation,  At surgeon's request and post-op pain management  Laterality: Right and Lower  Prep: chloraprep       Needles:  Injection technique: Single-shot  Needle Type: Echogenic Needle     Needle Length: 9cm 9 cm Needle Gauge: 21 and 21 G    Additional Needles:  Procedures: ultrasound guided (picture in chart) Adductor canal block Narrative:  Start time: 10/06/2013 7:20 AM End time: 10/06/2013 7:25 AM Injection made incrementally with aspirations every 5 mL.  Performed by: Personally  Anesthesiologist: Lorrene Reid, MD

## 2013-10-06 NOTE — Op Note (Signed)
PREOP DIAGNOSIS: DJD RIGHT KNEE POSTOP DIAGNOSIS: same PROCEDURE: RIGHT TKR ANESTHESIA: General and block ATTENDING SURGEON: Malerie Eakins G ASSISTANT: Loni Dolly PA  INDICATIONS FOR PROCEDURE: Harry Foster is a 68 y.o. male who has struggled for a long time with pain due to degenerative arthritis of the right knee.  The patient has failed many conservative non-operative measures and at this point has pain which limits the ability to sleep and walk.  The patient is offered total knee replacement.  Informed operative consent was obtained after discussion of possible risks of anesthesia, infection, neurovascular injury, DVT, and death.  The importance of the post-operative rehabilitation protocol to optimize result was stressed extensively with the patient.  SUMMARY OF FINDINGS AND PROCEDURE:  Harry Foster was taken to the operative suite where under the above anesthesia a right knee replacement was performed.  There were advanced degenerative changes and the bone quality was excellent.  We used the DePuy LCS system and placed size large femur, 6 MBT tibia, 41 mm all polyethylene patella, and a size 10 mm spacer.  Loni Dolly PA-C assisted throughout and was invaluable to the completion of the case in that he helped retract and maintain exposure while I placed components.  He also helped close thereby minimizing OR time.  The patient was admitted for appropriate post-op care to include perioperative antibiotics and mechanical and pharmacologic measures for DVT prophylaxis.  DESCRIPTION OF PROCEDURE:  Harry Foster was taken to the operative suite where the above anesthesia was applied.  The patient was positioned supine and prepped and draped in normal sterile fashion.  An appropriate time out was performed.  After the administration of kefzol pre-op antibiotic the leg was elevated and exsanguinated and a tourniquet inflated. A standard longitudinal incision was made on the anterior knee.   Dissection was carried down to the extensor mechanism.  All appropriate anti-infective measures were used including the pre-operative antibiotic, betadine impregnated drape, and closed hooded exhaust systems for each member of the surgical team.  A medial parapatellar incision was made in the extensor mechanism and the knee cap flipped and the knee flexed.  Some residual meniscal tissues were removed along with any remaining ACL/PCL tissue.  A guide was placed on the tibia and a flat cut was made on it's superior surface.  An intramedullary guide was placed in the femur and was utilized to make anterior and posterior cuts creating an appropriate flexion gap.  A second intramedullary guide was placed in the femur to make a distal cut properly balancing the knee with an extension gap equal to the flexion gap.  The three bones sized to the above mentioned sizes and the appropriate guides were placed and utilized.  A trial reduction was done and the knee easily came to full extension and the patella tracked well on flexion.  The trial components were removed and all bones were cleaned with pulsatile lavage and then dried thoroughly.  Cement was mixed and was pressurized onto the bones followed by placement of the aforementioned components.  Excess cement was trimmed and pressure was held on the components until the cement had hardened.  The tourniquet was deflated and a small amount of bleeding was controlled with cautery and pressure.  The knee was irrigated thoroughly.  The extensor mechanism was re-approximated with V-loc suture in running fashion.  The knee was flexed and the repair was solid.  The subcutaneous tissues were re-approximated with #0 and #2-0 vicryl and the skin closed with  a subcuticular stitch and steristrips.  A sterile dressing was applied.  Intraoperative fluids, EBL, and tourniquet time can be obtained from anesthesia records.  DISPOSITION:  The patient was taken to recovery room in stable  condition and admitted for appropriate post-op care to include peri-operative antibiotic and DVT prophylaxis with mechanical and pharmacologic measures.  Shania Bjelland G 10/06/2013, 9:37 AM

## 2013-10-06 NOTE — Anesthesia Preprocedure Evaluation (Addendum)
Anesthesia Evaluation  Patient identified by MRN, date of birth, ID band Patient awake    Reviewed: Allergy & Precautions, H&P , NPO status , Patient's Chart, lab work & pertinent test results, reviewed documented beta blocker date and time   Airway Mallampati: II TM Distance: >3 FB Neck ROM: Full    Dental  (+) Teeth Intact, Dental Advisory Given   Pulmonary  breath sounds clear to auscultation        Cardiovascular hypertension, Pt. on medications and Pt. on home beta blockers + dysrhythmias Atrial Fibrillation Rhythm:Regular Rate:Normal     Neuro/Psych    GI/Hepatic   Endo/Other    Renal/GU      Musculoskeletal   Abdominal   Peds  Hematology   Anesthesia Other Findings   Reproductive/Obstetrics                          Anesthesia Physical Anesthesia Plan  ASA: III  Anesthesia Plan: General   Post-op Pain Management:    Induction: Intravenous  Airway Management Planned: LMA  Additional Equipment:   Intra-op Plan:   Post-operative Plan: Extubation in OR  Informed Consent: I have reviewed the patients History and Physical, chart, labs and discussed the procedure including the risks, benefits and alternatives for the proposed anesthesia with the patient or authorized representative who has indicated his/her understanding and acceptance.   Dental advisory given  Plan Discussed with: CRNA, Anesthesiologist and Surgeon  Anesthesia Plan Comments:         Anesthesia Quick Evaluation

## 2013-10-06 NOTE — Progress Notes (Signed)
ANTICOAGULATION CONSULT NOTE - Initial Consult  Pharmacy Consult for warfarin Indication: VTE prophylaxis and AFib  Allergies  Allergen Reactions  . Levaquin [Levofloxacin In D5w]     "Problems /w tendon & muscle tears"  . Lisinopril Cough    Patient Measurements: Height: 5\' 11"  (180.3 cm) Weight: 290 lb 9 oz (131.798 kg) IBW/kg (Calculated) : 75.3 Heparin Dosing Weight:  Vital Signs: Temp: 98.2 F (36.8 C) (10/06 1101) Temp Source: Oral (10/06 1101) BP: 127/79 mmHg (10/06 1101) Pulse Rate: 64 (10/06 1101)  Labs:  Recent Labs  10/06/13 0646 10/06/13 1220  HGB  --  14.9  HCT  --  44.9  PLT  --  170  APTT 28  --   LABPROT 15.2  --   INR 1.20  --   CREATININE  --  1.81*    Estimated Creatinine Clearance: 54.1 ml/min (by C-G formula based on Cr of 1.81).   Medical History: Past Medical History  Diagnosis Date  . GI bleed 2010  . Hypertension   . Recurrent upper respiratory infection (URI)     completing treatment for sinus infection - with abx  . Depression     treated for depression when wife was very ill  . GI bleed     hx  . Diverticulosis     hx - no sx  . Dysrhythmia     hx A fib, on anticoagulants  . Arthritis     both knees and hands and left elbow, hip    Medications:  Prescriptions prior to admission  Medication Sig Dispense Refill  . ascorbic acid (VITAMIN C) 1000 MG tablet Take 1,000 mg by mouth daily.        Marland Kitchen aspirin 81 MG EC tablet Take 81 mg by mouth daily. Swallow whole.      . Coenzyme Q10 (CO Q 10) 100 MG CAPS Take 100 mg by mouth daily.      Marland Kitchen DHEA 50 MG TABS Take 50 mg by mouth daily.       . Lactobacillus (ACIDOPHILUS/BIFIDUS PO) Take 100 mg by mouth daily.      . magnesium gluconate (MAGONATE) 500 MG tablet Take 500 mg by mouth daily.      . metoprolol succinate (TOPROL-XL) 50 MG 24 hr tablet Take 25 mg by mouth 2 (two) times daily. Take with or immediately following a meal.      . Multiple Vitamin (MULTIVITAMIN WITH MINERALS)  TABS Take 2 tablets by mouth daily.      Marland Kitchen OLIVE LEAF EXTRACT PO Take 1 tablet by mouth daily.        Marland Kitchen olmesartan-hydrochlorothiazide (BENICAR HCT) 20-12.5 MG per tablet Take 1 tablet by mouth daily.      Marland Kitchen omega-3 acid ethyl esters (LOVAZA) 1 G capsule Take 2 g by mouth daily.       Marland Kitchen OVER THE COUNTER MEDICATION Take 1 capsule by mouth daily. Berry extract       . OVER THE COUNTER MEDICATION Take 1 capsule by mouth daily. Vitamin d 5000 units w/ iodine 1038mcg       . oxyCODONE-acetaminophen (PERCOCET) 10-325 MG per tablet Take 1-2 tablets by mouth every 4 (four) hours as needed for pain.      . Pomegranate, Punica granatum, (POMEGRANATE PO) Take 250 mg by mouth daily.       . Saw Palmetto, Serenoa repens, (SAW PALMETTO PO) Take 500 mg by mouth daily.       Marland Kitchen VITAMIN E PO  Take 360 mg by mouth daily.       Marland Kitchen warfarin (COUMADIN) 5 MG tablet Take 10 mg by mouth daily.      Marland Kitchen triamcinolone (NASACORT) 55 MCG/ACT nasal inhaler Place 1 spray into the nose daily as needed (allergies).         Assessment: 68 yo male on warfarin PTA for Atrial Fibrillation. PTA dose: 10 mg po daily.  Warfarin was held prior to TKA and is being resumed today in addition to AFib, also for post-op prophylaxis.  Baseline INR is 1.2.  CBC is stable and wnl.   Goal of Therapy:  INR 2-3 Monitor platelets by anticoagulation protocol: Yes    Plan:  Warfarin 10 mg po x1 Daily INR Dc Lovenox when INR therapeutic    Hughes Better, PharmD, BCPS Clinical Pharmacist Pager: [phone] 10/06/2013 1:03 PM

## 2013-10-06 NOTE — Progress Notes (Signed)
Orthopedic Tech Progress Note Patient Details:  Harry Foster [birth year] [id number]  CPM Right Knee CPM Right Knee: On Right Knee Flexion (Degrees): 90 Right Knee Extension (Degrees): 0 Additional Comments: Trapeze bar and foot roll   Cammer, Theodoro Parma 10/06/2013, 10:52 AM

## 2013-10-06 NOTE — Transfer of Care (Signed)
Immediate Anesthesia Transfer of Care Note  Patient: Harry Foster  Procedure(s) Performed: Procedure(s): TOTAL KNEE ARTHROPLASTY WITH REVISION COMPONENTS (Right)  Patient Location: PACU  Anesthesia Type:General  Level of Consciousness: awake and alert   Airway & Oxygen Therapy: Patient Spontanous Breathing and Patient connected to nasal cannula oxygen  Post-op Assessment: Report given to PACU RN and Post -op Vital signs reviewed and stable  Post vital signs: Reviewed and stable  Complications: No apparent anesthesia complications

## 2013-10-06 NOTE — Anesthesia Postprocedure Evaluation (Signed)
  Anesthesia Post-op Note  Patient: Harry Foster  Procedure(s) Performed: Procedure(s): TOTAL KNEE ARTHROPLASTY WITH REVISION COMPONENTS (Right)  Patient Location: PACU  Anesthesia Type:General and GA combined with regional for post-op pain  Level of Consciousness: awake, alert  and oriented  Airway and Oxygen Therapy: Patient Spontanous Breathing and Patient connected to nasal cannula oxygen  Post-op Pain: mild  Post-op Assessment: Post-op Vital signs reviewed, Patient's Cardiovascular Status Stable, Respiratory Function Stable, Patent Airway and Pain level controlled  Post-op Vital Signs: stable  Last Vitals:  Filed Vitals:   10/06/13 1410  BP: 113/73  Pulse: 93  Temp: 36.7 C  Resp: 18    Complications: No apparent anesthesia complications

## 2013-10-06 NOTE — Progress Notes (Signed)
PHARMACIST - PHYSICIAN ORDER COMMUNICATION  CONCERNING: P&T Medication Policy on Herbal Medications  DESCRIPTION:  This patient's order for:  Co-Q10 & DHEA  has been noted.  This product(s) is classified as an "herbal" or natural product. Due to a lack of definitive safety studies or FDA approval, nonstandard manufacturing practices, plus the potential risk of unknown drug-drug interactions while on inpatient medications, the Pharmacy and Therapeutics Committee does not permit the use of "herbal" or natural products of this type within [facility].   ACTION TAKEN: The pharmacy department is unable to verify this order at this time and your patient has been informed of this safety policy. Please reevaluate patient's clinical condition at discharge and address if the herbal or natural product(s) should be resumed at that time.  Salome Arnt, PharmD, BCPS Pager # [phone] 10/06/2013 11:14 AM

## 2013-10-07 ENCOUNTER — Encounter (HOSPITAL_COMMUNITY): Payer: Self-pay | Admitting: General Practice

## 2013-10-07 LAB — CBC
HCT: 41.8 % (ref 39.0–52.0)
Hemoglobin: 13.7 g/dL (ref 13.0–17.0)
MCH: 31.4 pg (ref 26.0–34.0)
MCHC: 32.8 g/dL (ref 30.0–36.0)
MCV: 95.7 fL (ref 78.0–100.0)
PLATELETS: 182 10*3/uL (ref 150–400)
RBC: 4.37 MIL/uL (ref 4.22–5.81)
RDW: 13.1 % (ref 11.5–15.5)
WBC: 8.5 10*3/uL (ref 4.0–10.5)

## 2013-10-07 LAB — BASIC METABOLIC PANEL
ANION GAP: 12 (ref 5–15)
BUN: 28 mg/dL — ABNORMAL HIGH (ref 6–23)
CALCIUM: 9.1 mg/dL (ref 8.4–10.5)
CHLORIDE: 102 meq/L (ref 96–112)
CO2: 24 meq/L (ref 19–32)
Creatinine, Ser: 1.76 mg/dL — ABNORMAL HIGH (ref 0.50–1.35)
GFR calc Af Amer: 44 mL/min — ABNORMAL LOW (ref >90)
GFR calc non Af Amer: 38 mL/min — ABNORMAL LOW (ref >90)
Glucose, Bld: 162 mg/dL — ABNORMAL HIGH (ref 70–99)
Potassium: 4.6 mEq/L (ref 3.7–5.3)
SODIUM: 138 meq/L (ref 137–147)

## 2013-10-07 LAB — PROTIME-INR
INR: 1.29 (ref 0.00–1.49)
PROTHROMBIN TIME: 16.1 s — AB (ref 11.6–15.2)

## 2013-10-07 MED ORDER — WARFARIN SODIUM 10 MG PO TABS
10.0000 mg | ORAL_TABLET | Freq: Once | ORAL | Status: AC
  Administered 2013-10-07: 10 mg via ORAL
  Filled 2013-10-07: qty 1

## 2013-10-07 NOTE — Clinical Social Work Placement (Signed)
Clinical Social Work Department CLINICAL SOCIAL WORK PLACEMENT NOTE 10/07/2013  Patient:  Harry Foster, Harry Foster  Account Number:  [id number] Admit date:  10/06/2013  Clinical Social Worker:  Lovey Newcomer  Date/time:  10/07/2013 11:14 AM  Clinical Social Work is seeking post-discharge placement for this patient at the following level of care:   SKILLED NURSING   (*CSW will update this form in Epic as items are completed)   10/07/2013  Patient/family provided with [facility] Department of Clinical Social Work's list of facilities offering this level of care within the geographic area requested by the patient (or if unable, by the patient's family).  10/07/2013  Patient/family informed of their freedom to choose among providers that offer the needed level of care, that participate in Medicare, Medicaid or managed care program needed by the patient, have an available bed and are willing to accept the patient.  10/07/2013  Patient/family informed of [facility]' ownership interest in [facility], as well as of the fact that they are under no obligation to receive care at this facility.  PASARR submitted to EDS on 10/07/2013 PASARR number received on 10/07/2013  FL2 transmitted to all facilities in geographic area requested by pt/family on  10/07/2013 FL2 transmitted to all facilities within larger geographic area on   Patient informed that his/her managed care company has contracts with or will negotiate with  certain facilities, including the following:     Patient/family informed of bed offers received:   Patient chooses bed at  Physician recommends and patient chooses bed at    Patient to be transferred to  on   Patient to be transferred to facility by  Patient and family notified of transfer on  Name of family member notified:    The following physician request were entered in Epic:   Additional Comments:    Liz Beach MSW, [place], [place],  [id number]

## 2013-10-07 NOTE — Progress Notes (Signed)
Physical Therapy Treatment Patient Details Name: Harry Foster MRN: [id number] DOB: 19-Aug-1945 Today's Date: 10/07/2013    History of Present Illness 68 y.o. male admitted to [facility] on 10/06/13 for elective R TKA.  Pt with significant PMHx of HTN, depression, h/o A-fib s/p cardioversion, and L TKA.      PT Comments    R LE observed to be edematous, elevated on foam block. Patient progressing. Plans for SNF.  Follow Up Recommendations  SNF     Equipment Recommendations  None recommended by PT    Recommendations for Other Services       Precautions / Restrictions Precautions Precautions: Knee Precaution Booklet Issued: Yes (comment) Precaution Comments: reviewed precautions Required Braces or Orthoses: Knee Immobilizer - Right Knee Immobilizer - Right: On at all times Restrictions RLE Weight Bearing: Weight bearing as tolerated    Mobility  Bed Mobility Overal bed mobility: Needs Assistance Bed Mobility: Sit to Supine       Sit to supine: Min assist   General bed mobility comments: support of R leg onto bed.  Transfers Overall transfer level: Needs assistance Equipment used: Rolling walker (2 wheeled) Transfers: Sit to/from Stand Sit to Stand: Min assist         General transfer comment: extra time to get up from lower surface. cues for hand placement and R leg position  Ambulation/Gait Ambulation/Gait assistance: Min assist Ambulation Distance (Feet): 125 Feet Assistive device: Rolling walker (2 wheeled) Gait Pattern/deviations: Step-to pattern;Antalgic;Decreased step length - right;Decreased stance time - right Gait velocity: decreased   General Gait Details: pt with antalgic gait pattern.  Verbal cues for LE sequencing during gait and upright posture.    Stairs            Wheelchair Mobility    Modified Rankin (Stroke Patients Only)       Balance                                    Cognition Arousal/Alertness: Awake/alert                          Exercises Total Joint Exercises Ankle Circles/Pumps: AROM;Both;20 reps;Seated Quad Sets: AROM;Right;10 reps;Supine Heel Slides: AAROM;Right;10 reps;Supine Straight Leg Raises: AAROM;Right;10 reps;Supine Goniometric ROM: 10-45 R knee     General Comments        Pertinent Vitals/Pain Pain Score: 7  Pain Location: R knee Pain Descriptors / Indicators: Aching;Constant Pain Intervention(s): Patient requesting pain meds-RN notified;Ice applied;Monitored during session    Home Living Family/patient expects to be discharged to:: Private residence Living Arrangements: Alone                  Prior Function            PT Goals (current goals can now be found in the care plan section) Progress towards PT goals: Progressing toward goals    Frequency  7X/week    PT Plan Current plan remains appropriate    Co-evaluation             End of Session Equipment Utilized During Treatment: Right knee immobilizer Activity Tolerance: Patient tolerated treatment well Patient left: in bed;with call bell/phone within reach;with family/visitor present     Time: [id number] PT Time Calculation (min): 29 min  Charges:  $Gait Training: 8-22 mins $Therapeutic Exercise: 8-22 mins  G Codes:      Marcelino Freestone PT [id number]  10/07/2013, 4:56 PM

## 2013-10-07 NOTE — Progress Notes (Signed)
Subjective: 1 Day Post-Op Procedure(s) (LRB): TOTAL KNEE ARTHROPLASTY WITH REVISION COMPONENTS (Right)  Activity level:  WBAT  Diet tolerance:  Eating well Voiding:  OK Patient reports pain as mild and moderate.    Objective: Vital signs in last 24 hours: Temp:  [97.8 F (36.6 C)-98.9 F (37.2 C)] 98.4 F (36.9 C) (10/07 0559) Pulse Rate:  [56-93] 76 (10/07 0559) Resp:  [10-22] 18 (10/07 0559) BP: (107-140)/(72-84) 107/76 mmHg (10/07 0559) SpO2:  [93 %-100 %] 95 % (10/07 0559)  Labs:  Recent Labs  10/06/13 1220  HGB 14.9    Recent Labs  10/06/13 1220  WBC 9.6  RBC 4.78  HCT 44.9  PLT 170    Recent Labs  10/06/13 1220  CREATININE 1.81*    Recent Labs  10/06/13 0646  INR 1.20    Physical Exam:  Neurologically intact ABD soft Neurovascular intact Sensation intact distally Intact pulses distally Dorsiflexion/Plantar flexion intact Incision: dressing C/D/I and scant drainage No cellulitis present Compartment soft  Assessment/Plan:  1 Day Post-Op Procedure(s) (LRB): TOTAL KNEE ARTHROPLASTY WITH REVISION COMPONENTS (Right) Advance diet Up with therapy D/C IV fluids Plan for discharge tomorrow Discharge to SNF if doing well and cleared by PT Dressing changed to Aquacel Follow up in the office in 2 weeks post-op Lovenox bridge to coumadin for DVT prevention and Afib CPM 0-60 and increase by 10 degrees as tolerated up to 90 degrees    Ladasha Schnackenberg PAUL 10/07/2013, 8:04 AM

## 2013-10-07 NOTE — Clinical Social Work Psychosocial (Signed)
Clinical Social Work Department BRIEF PSYCHOSOCIAL ASSESSMENT 10/07/2013  Patient:  Harry Foster, Harry Foster     Account Number:  [id number]     Admit date:  10/06/2013  Clinical Social Worker:  Lovey Newcomer  Date/Time:  10/07/2013 11:11 AM  Referred by:  Physician  Date Referred:  10/07/2013 Referred for  SNF Placement   Other Referral:   NA   Interview type:  Patient Other interview type:   Patient alert and oriented at time of assessment.    PSYCHOSOCIAL DATA Living Status:  ALONE Admitted from facility:   Level of care:   Primary support name:  Sharrell Ku Primary support relationship to patient:  FAMILY Degree of support available:   Support is good.    CURRENT CONCERNS Current Concerns  Post-Acute Placement   Other Concerns:   NA    SOCIAL WORK ASSESSMENT / PLAN CSW met with patient at bedside to complete assessment. Patient appeared calm and engaged in assessment. He states that he lives alone, but has good family support in the area. Patient reports that he has pre-arranged his rehab with [facility] and plans to DC to this facility when stable. CSW explained SNF search/placement process and answered questions. CSW will assist.   Assessment/plan status:  Psychosocial Support/Ongoing Assessment of Needs Other assessment/ plan:   Complete Fl2, Fax, PASRR   Information/referral to community resources:   CSW contact information and SNF list given.    PATIENT'S/FAMILY'S RESPONSE TO PLAN OF CARE: Patient plans to discharge to [facility] once stable. CSW will assist.       Liz Beach MSW, [place], [place], [phone]

## 2013-10-07 NOTE — Progress Notes (Signed)
ANTICOAGULATION CONSULT NOTE - Initial Consult  Pharmacy Consult for warfarin Indication: VTE prophylaxis and AFib  Allergies  Allergen Reactions  . Levaquin [Levofloxacin In D5w]     "Problems /w tendon & muscle tears"  . Lisinopril Cough    Patient Measurements: Height: 5\' 11"  (180.3 cm) Weight: 290 lb 9 oz (131.798 kg) IBW/kg (Calculated) : 75.3 Heparin Dosing Weight:  Vital Signs: Temp: 98.3 F (36.8 C) (10/07 1257) Temp Source: Oral (10/07 1257) BP: 96/52 mmHg (10/07 1257) Pulse Rate: 85 (10/07 1257)  Labs:  Recent Labs  10/06/13 0646 10/06/13 1220 10/07/13 0820  HGB  --  14.9 13.7  HCT  --  44.9 41.8  PLT  --  170 182  APTT 28  --   --   LABPROT 15.2  --  16.1*  INR 1.20  --  1.29  CREATININE  --  1.81* 1.76*    Estimated Creatinine Clearance: 55.6 ml/min (by C-G formula based on Cr of 1.76).   Medical History: Past Medical History  Diagnosis Date  . GI bleed 2010  . Hypertension   . Recurrent upper respiratory infection (URI)     completing treatment for sinus infection - with abx  . Depression     treated for depression when wife was very ill  . GI bleed     hx  . Diverticulosis     hx - no sx  . Dysrhythmia     hx A fib, on anticoagulants  . Arthritis     both knees and hands and left elbow, hip    Medications:  Prescriptions prior to admission  Medication Sig Dispense Refill  . ascorbic acid (VITAMIN C) 1000 MG tablet Take 1,000 mg by mouth daily.        Marland Kitchen aspirin 81 MG EC tablet Take 81 mg by mouth daily. Swallow whole.      . Coenzyme Q10 (CO Q 10) 100 MG CAPS Take 100 mg by mouth daily.      Marland Kitchen DHEA 50 MG TABS Take 50 mg by mouth daily.       . Lactobacillus (ACIDOPHILUS/BIFIDUS PO) Take 100 mg by mouth daily.      . magnesium gluconate (MAGONATE) 500 MG tablet Take 500 mg by mouth daily.      . metoprolol succinate (TOPROL-XL) 50 MG 24 hr tablet Take 25 mg by mouth 2 (two) times daily. Take with or immediately following a meal.       . Multiple Vitamin (MULTIVITAMIN WITH MINERALS) TABS Take 2 tablets by mouth daily.      Marland Kitchen OLIVE LEAF EXTRACT PO Take 1 tablet by mouth daily.        Marland Kitchen olmesartan-hydrochlorothiazide (BENICAR HCT) 20-12.5 MG per tablet Take 1 tablet by mouth daily.      Marland Kitchen OVER THE COUNTER MEDICATION Take 1 capsule by mouth daily. Berry extract       . OVER THE COUNTER MEDICATION Take 1 capsule by mouth daily. Vitamin d 5000 units w/ iodine 1032mcg       . oxyCODONE-acetaminophen (PERCOCET) 10-325 MG per tablet Take 1-2 tablets by mouth every 4 (four) hours as needed for pain.      . Pomegranate, Punica granatum, (POMEGRANATE PO) Take 250 mg by mouth daily.       . Saw Palmetto, Serenoa repens, (SAW PALMETTO PO) Take 500 mg by mouth daily.       Marland Kitchen VITAMIN E PO Take 360 mg by mouth daily.       Marland Kitchen  warfarin (COUMADIN) 5 MG tablet Take 10 mg by mouth daily.      Marland Kitchen triamcinolone (NASACORT) 55 MCG/ACT nasal inhaler Place 1 spray into the nose daily as needed (allergies).         Assessment: 68 yo male on warfarin PTA for Atrial Fibrillation. PTA dose: 10 mg po daily.  Warfarin was held prior to TKA and was resumed yesterday in addition to AFib, also for post-op prophylaxis.  Baseline INR is 1.2.  CBC is stable and wnl.   Goal of Therapy:  INR 2-3 Monitor platelets by anticoagulation protocol: Yes    Plan:  Warfarin 10 mg po x1 Daily INR Dc Lovenox when INR therapeutic    Hughes Better, PharmD, BCPS Clinical Pharmacist Pager: [phone] 10/07/2013 2:05 PM

## 2013-10-07 NOTE — Evaluation (Signed)
Occupational Therapy Evaluation Patient Details Name: Harry Foster MRN: [id number] DOB: 1945-05-18 Today's Date: 10/07/2013    History of Present Illness 68 y.o. male admitted to [facility] on 10/06/13 for elective R TKA.  Pt with significant PMHx of HTN, depression, h/o A-fib s/p cardioversion, and L TKA.     Clinical Impression   Pt admitted with the above diagnoses and presents with below problem list. Pt will benefit from continued acute OT to address the below listed deficits and maximize independence with basic ADLs prior to d/c to next venue. PTA pt was mod I with ADLs with recent use of cane. Pt currently at min A level for LB ADLs.      Follow Up Recommendations  SNF - Pt prearranged with [place] Recommendations  Other (comment) (defer to next venue)    Recommendations for Other Services       Precautions / Restrictions Precautions Precautions: Knee Precaution Comments: reviewed precautions Required Braces or Orthoses: Knee Immobilizer - Right Knee Immobilizer - Right: On at all times Restrictions Weight Bearing Restrictions: Yes RLE Weight Bearing: Weight bearing as tolerated      Mobility Bed Mobility Overal bed mobility: Needs Assistance Bed Mobility: Supine to Sit     Supine to sit: Modified independent (Device/Increase time);HOB elevated     General bed mobility comments: HOB eleveted, used trapeze bar and bed rails  Transfers Overall transfer level: Needs assistance Equipment used: Rolling walker (2 wheeled) Transfers: Sit to/from Stand           General transfer comment: sit<>stand from elevated surface; cues for hand placement    Balance Overall balance assessment: Needs assistance Sitting-balance support: No upper extremity supported;Feet supported Sitting balance-Leahy Scale: Good     Standing balance support: Bilateral upper extremity supported;During functional activity Standing balance-Leahy Scale: Fair                               ADL Overall ADL's : Needs assistance/impaired Eating/Feeding: Set up;Sitting   Grooming: Set up;Sitting;Standing   Upper Body Bathing: Set up;Sitting   Lower Body Bathing: Minimal assistance;With adaptive equipment;Sit to/from stand   Upper Body Dressing : Set up;Sitting   Lower Body Dressing: Minimal assistance;Sit to/from stand;With adaptive equipment   Toilet Transfer: Min guard;Ambulation;RW (3n1 over toilet)   Toileting- Clothing Manipulation and Hygiene: Min guard;Sit to/from stand   Tub/ Shower Transfer: Min guard;Ambulation;3 in 1;Rolling walker   Functional mobility during ADLs: Min guard;Rolling walker General ADL Comments: Educated pt on basic techniques for ADL completion with knee precautions. Pt at min A level for LB ADLs due to balance.     Vision                     Perception     Praxis      Pertinent Vitals/Pain Pain Assessment: 0-10 Pain Score: 5  Pain Location: R knee Pain Descriptors / Indicators: Aching Pain Intervention(s): Limited activity within patient's tolerance;Monitored during session;Ice applied;Repositioned     Hand Dominance     Extremity/Trunk Assessment Upper Extremity Assessment Upper Extremity Assessment: Overall WFL for tasks assessed   Lower Extremity Assessment Lower Extremity Assessment: Defer to PT evaluation       Communication Communication Communication: No difficulties   Cognition Arousal/Alertness: Awake/alert Behavior During Therapy: WFL for tasks assessed/performed Overall Cognitive Status: Within Functional Limits for tasks assessed  General Comments       Exercises       Shoulder Instructions      Home Living Family/patient expects to be discharged to:: Skilled nursing facility Living Arrangements: Alone                                      Prior Functioning/Environment Level of Independence: Independent with assistive  device(s)        Comments: started using a cane PTA,     OT Diagnosis: Acute pain   OT Problem List: Impaired balance (sitting and/or standing);Decreased knowledge of use of DME or AE;Decreased knowledge of precautions;Pain   OT Treatment/Interventions: Self-care/ADL training;Therapeutic exercise;DME and/or AE instruction;Therapeutic activities;Patient/family education;Balance training    OT Goals(Current goals can be found in the care plan section) Acute Rehab OT Goals Patient Stated Goal: not stated OT Goal Formulation: With patient Time For Goal Achievement: 10/14/13 Potential to Achieve Goals: Good ADL Goals Pt Will Perform Lower Body Bathing: with modified independence;with adaptive equipment;sit to/from stand Pt Will Perform Lower Body Dressing: with modified independence;with adaptive equipment;sit to/from stand Pt Will Transfer to Toilet: with modified independence;ambulating (3n1 over toilet) Pt Will Perform Toileting - Clothing Manipulation and hygiene: with modified independence;sit to/from stand Pt Will Perform Tub/Shower Transfer: with modified independence;ambulating;3 in 1;rolling walker  OT Frequency: Min 2X/week   Barriers to D/C: Decreased caregiver support          Co-evaluation              End of Session Equipment Utilized During Treatment: Gait belt;Rolling walker;Right knee immobilizer  Activity Tolerance: Patient tolerated treatment well Patient left: in chair;with call bell/phone within reach;with family/visitor present   Time: 1130-1152 OT Time Calculation (min): 22 min Charges:  OT General Charges $OT Visit: 1 Procedure OT Evaluation $Initial OT Evaluation Tier I: 1 Procedure OT Treatments $Self Care/Home Management : 8-22 mins G-Codes:    Hortencia Pilar November 05, 2013, 12:54 PM

## 2013-10-07 NOTE — Progress Notes (Signed)
Physical Therapy Treatment Patient Details Name: Harry Foster MRN: [id number] DOB: [birth year] Today's Date: 10/07/2013    History of Present Illness 68 y.o. male admitted to [facility] on 10/06/13 for elective R TKA.  Pt with significant PMHx of HTN, depression, h/o A-fib s/p cardioversion, and L TKA.      PT Comments    Pt reports CPM was very uncomfortable at 90 degrees yesterday and today unable to perform SLR. Patient did tolerate ambulating x 150'.  Continue this PM with exercises and gait. Plans for SNF.  Follow Up Recommendations  SNF     Equipment Recommendations  None recommended by PT    Recommendations for Other Services       Precautions / Restrictions Precautions Precautions: Knee Precaution Booklet Issued: Yes (comment) Precaution Comments: reviewed precautions Required Braces or Orthoses: Knee Immobilizer - Right Knee Immobilizer - Right: On at all times Restrictions Weight Bearing Restrictions: Yes RLE Weight Bearing: Weight bearing as tolerated    Mobility  Bed Mobility  Transfers Overall transfer level: Needs assistance Equipment used: Rolling walker (2 wheeled) Transfers: Sit to/from Stand Sit to Stand: Mod assist         General transfer comment: extra time to get up from lower surface. cues for hand placement and R leg position  Ambulation/Gait Ambulation/Gait assistance: Min assist Ambulation Distance (Feet): 150 Feet Assistive device: Rolling walker (2 wheeled) Gait Pattern/deviations: Step-to pattern;Antalgic;Decreased step length - right;Decreased stance time - right Gait velocity: decreased   General Gait Details: pt with antalgic gait pattern.  Verbal cues for LE sequencing during gait and upright posture.    Stairs            Wheelchair Mobility    Modified Rankin (Stroke Patients Only)       Balance Overall balance assessment: Needs assistance Sitting-balance support: No upper extremity supported;Feet supported Sitting  balance-Leahy Scale: Good     Standing balance support: Bilateral upper extremity supported;During functional activity Standing balance-Leahy Scale: Fair                      Cognition Arousal/Alertness: Awake/alert Behavior During Therapy: WFL for tasks assessed/performed Overall Cognitive Status: Within Functional Limits for tasks assessed                      Exercises      General Comments        Pertinent Vitals/Pain Pain Assessment: 0-10 Pain Score: 7  Pain Location: R knee Pain Descriptors / Indicators: Aching;Constant Pain Intervention(s): Patient requesting pain meds-RN notified;Ice applied;Monitored during session    Home Living Family/patient expects to be discharged to:: Private residence Living Arrangements: Alone                  Prior Function Level of Independence: Independent with assistive device(s)      Comments: started using a cane PTA,    PT Goals (current goals can now be found in the care plan section) Acute Rehab PT Goals Patient Stated Goal: not stated Progress towards PT goals: Progressing toward goals    Frequency  7X/week    PT Plan Current plan remains appropriate    Co-evaluation             End of Session Equipment Utilized During Treatment: Right knee immobilizer Activity Tolerance: Patient limited by pain Patient left: in chair;with call bell/phone within reach     Time: 1230-1248 PT Time Calculation (min): 18 min  Charges:  $  Gait Training: 8-22 mins                    G Codes:      Claretha Cooper 10/07/2013, 1:45 PM Tresa Endo PT [phone]

## 2013-10-08 ENCOUNTER — Encounter (HOSPITAL_COMMUNITY): Payer: Self-pay | Admitting: Orthopaedic Surgery

## 2013-10-08 LAB — CBC
HCT: 40.2 % (ref 39.0–52.0)
HEMOGLOBIN: 13.2 g/dL (ref 13.0–17.0)
MCH: 31.4 pg (ref 26.0–34.0)
MCHC: 32.8 g/dL (ref 30.0–36.0)
MCV: 95.5 fL (ref 78.0–100.0)
Platelets: 162 10*3/uL (ref 150–400)
RBC: 4.21 MIL/uL — ABNORMAL LOW (ref 4.22–5.81)
RDW: 13.2 % (ref 11.5–15.5)
WBC: 7.8 10*3/uL (ref 4.0–10.5)

## 2013-10-08 LAB — PROTIME-INR
INR: 1.49 (ref 0.00–1.49)
Prothrombin Time: 18 seconds — ABNORMAL HIGH (ref 11.6–15.2)

## 2013-10-08 MED ORDER — METHOCARBAMOL 500 MG PO TABS: 500.0000 mg | ORAL_TABLET | Freq: Four times a day (QID) | ORAL | Status: DC | PRN

## 2013-10-08 MED ORDER — ENOXAPARIN SODIUM 30 MG/0.3ML ~~LOC~~ SOLN: 30.0000 mg | Freq: Two times a day (BID) | SUBCUTANEOUS | Status: DC

## 2013-10-08 MED ORDER — OXYCODONE-ACETAMINOPHEN 10-325 MG PO TABS: 1.0000 | ORAL_TABLET | ORAL | Status: DC | PRN

## 2013-10-08 MED ORDER — WARFARIN SODIUM 10 MG PO TABS
10.0000 mg | ORAL_TABLET | Freq: Once | ORAL | Status: DC
  Filled 2013-10-08: qty 1

## 2013-10-08 MED ORDER — WARFARIN SODIUM 5 MG PO TABS: 10.0000 mg | ORAL_TABLET | Freq: Every day | ORAL | Status: DC

## 2013-10-08 NOTE — Progress Notes (Signed)
Physical Therapy Treatment Patient Details Name: Harry Foster MRN: [id number] DOB: [birth year] Today's Date: 10/08/2013    History of Present Illness      PT Comments    Pt progressing well. Plan is for d/c to SNF today for continued therapy.  Follow Up Recommendations  SNF     Equipment Recommendations  None recommended by PT    Recommendations for Other Services       Precautions / Restrictions Precautions Precautions: Knee Required Braces or Orthoses: Knee Immobilizer - Right Knee Immobilizer - Right: On at all times Restrictions Weight Bearing Restrictions: Yes RLE Weight Bearing: Weight bearing as tolerated    Mobility  Bed Mobility         Supine to sit: Modified independent (Device/Increase time)        Transfers   Equipment used: Rolling walker (2 wheeled)   Sit to Stand: Min assist         General transfer comment: increased time requried  Ambulation/Gait Ambulation/Gait assistance: Min guard Ambulation Distance (Feet): 250 Feet Assistive device: Rolling walker (2 wheeled) Gait Pattern/deviations: Step-to pattern;Decreased stride length Gait velocity: decreased       Stairs            Wheelchair Mobility    Modified Rankin (Stroke Patients Only)       Balance                                    Cognition Arousal/Alertness: Awake/alert Behavior During Therapy: WFL for tasks assessed/performed Overall Cognitive Status: Within Functional Limits for tasks assessed                      Exercises Total Joint Exercises Ankle Circles/Pumps: AROM;Both;20 reps Quad Sets: AROM;Right;10 reps Heel Slides: AAROM;Right;10 reps Hip ABduction/ADduction: AAROM;Right;10 reps Straight Leg Raises: AAROM;Right;10 reps Goniometric ROM: 0-45 R knee AAROM in supine    General Comments        Pertinent Vitals/Pain Pain Assessment: 0-10 Pain Score: 5  Pain Location: R knee Pain Intervention(s): Monitored  during session;Premedicated before session    Home Living                      Prior Function            PT Goals (current goals can now be found in the care plan section) Progress towards PT goals: Progressing toward goals    Frequency  7X/week    PT Plan Current plan remains appropriate    Co-evaluation             End of Session Equipment Utilized During Treatment: Right knee immobilizer;Gait belt Activity Tolerance: Patient tolerated treatment well Patient left: Other (comment) (in bathroom)     Time: [id number] PT Time Calculation (min): 28 min  Charges:  $Gait Training: 8-22 mins $Therapeutic Exercise: 8-22 mins                    G Codes:      Lorriane Shire 10/08/2013, 12:52 PM

## 2013-10-08 NOTE — Clinical Social Work Psychosocial (Signed)
Clinical Social Work Department CLINICAL SOCIAL WORK PLACEMENT NOTE 10/08/2013  Patient:  Harry Foster, Harry Foster  Account Number:  [id number] Admit date:  10/06/2013  Clinical Social Worker:  Lovey Newcomer  Date/time:  10/07/2013 11:14 AM  Clinical Social Work is seeking post-discharge placement for this patient at the following level of care:   SKILLED NURSING   (*CSW will update this form in Epic as items are completed)   10/07/2013  Patient/family provided with [place] Department of Clinical Social Work's list of facilities offering this level of care within the geographic area requested by the patient (or if unable, by the patient's family).  10/07/2013  Patient/family informed of their freedom to choose among providers that offer the needed level of care, that participate in Medicare, Medicaid or managed care program needed by the patient, have an available bed and are willing to accept the patient.  10/07/2013  Patient/family informed of [facility]' ownership interest in [facility], as well as of the fact that they are under no obligation to receive care at this facility.  PASARR submitted to EDS on 10/07/2013 PASARR number received on 10/07/2013  FL2 transmitted to all facilities in geographic area requested by pt/family on  10/07/2013 FL2 transmitted to all facilities within larger geographic area on   Patient informed that his/her managed care company has contracts with or will negotiate with  certain facilities, including the following:     Patient/family informed of bed offers received:  10/08/2013 Patient chooses bed at [facility] Physician recommends and patient chooses bed at    Patient to be transferred to [facility] on  10/08/2013 Patient to be transferred to facility by Ambulance Patient and family notified of transfer on 10/08/2013 Name of family member notified:  Patient notifying his family  The following physician request were  entered in Epic:   Additional Comments:   Per MD patient ready for DC to [facility]. RN, patient, patient's family, and facility notified of DC. RN given number for report. DC packet on chart. [facility] transport requested for patient. CSW signing off.    Liz Beach MSW, [place], [place], [id number]

## 2013-10-08 NOTE — Discharge Summary (Signed)
Patient ID: Harry Foster MRN: [id number] DOB/AGE: 68-Feb-1947 68 y.o.  Admit date: 10/06/2013 Discharge date: 10/08/2013  Admission Diagnoses:  Principal Problem:   Osteoarthritis of right knee Active Problems:   Obesity   Right knee DJD   Discharge Diagnoses:  Same  Past Medical History  Diagnosis Date  . GI bleed 2010  . Hypertension   . Recurrent upper respiratory infection (URI)     completing treatment for sinus infection - with abx  . Depression     treated for depression when wife was very ill  . GI bleed     hx  . Diverticulosis     hx - no sx  . Dysrhythmia     hx A fib, on anticoagulants  . Arthritis     both knees and hands and left elbow, hip    Surgeries: Procedure(s): TOTAL KNEE ARTHROPLASTY WITH REVISION COMPONENTS on 10/06/2013   Consultants:    Discharged Condition: Improved  Hospital Course: Harry Foster is an 68 y.o. male who was admitted 10/06/2013 for operative treatment ofOsteoarthritis of right knee. Patient has severe unremitting pain that affects sleep, daily activities, and work/hobbies. After pre-op clearance the patient was taken to the operating room on 10/06/2013 and underwent  Procedure(s): TOTAL KNEE ARTHROPLASTY WITH REVISION COMPONENTS.    Patient was given perioperative antibiotics: Anti-infectives   Start     Dose/Rate Route Frequency Ordered Stop   10/06/13 1400  ceFAZolin (ANCEF) 3 g in dextrose 5 % 50 mL IVPB     3 g 160 mL/hr over 30 Minutes Intravenous Every 6 hours 10/06/13 1107 10/06/13 2310   10/06/13 0600  ceFAZolin (ANCEF) 3 g in dextrose 5 % 50 mL IVPB     3 g 160 mL/hr over 30 Minutes Intravenous On call to O.R. 10/05/13 1452 10/06/13 0802       Patient was given sequential compression devices, early ambulation, and chemoprophylaxis to prevent DVT.  Patient benefited maximally from hospital stay and there were no complications.    Recent vital signs: Patient Vitals for the past 24 hrs:  BP Temp Pulse Resp  SpO2  10/08/13 0915 - - 84 - -  10/08/13 0534 119/73 mmHg 97.9 F (36.6 C) 75 18 95 %  10/07/13 2158 123/78 mmHg 98 F (36.7 C) 72 18 96 %     Recent laboratory studies:  Recent Labs  10/06/13 1220 10/07/13 0820 10/08/13 0500  WBC 9.6 8.5 7.8  HGB 14.9 13.7 13.2  HCT 44.9 41.8 40.2  PLT 170 182 162  NA  --  138  --   K  --  4.6  --   CL  --  102  --   CO2  --  24  --   BUN  --  28*  --   CREATININE 1.81* 1.76*  --   GLUCOSE  --  162*  --   INR  --  1.29 1.49  CALCIUM  --  9.1  --      Discharge Medications:     Medication List         ACIDOPHILUS/BIFIDUS PO  Take 100 mg by mouth daily.     ascorbic acid 1000 MG tablet  Commonly known as:  VITAMIN C  Take 1,000 mg by mouth daily.     aspirin 81 MG EC tablet  Take 81 mg by mouth daily. Swallow whole.     Co Q 10 100 MG Caps  Take 100 mg by mouth daily.  DHEA 50 MG Tabs  Take 50 mg by mouth daily.     enoxaparin 30 MG/0.3ML injection  Commonly known as:  LOVENOX  Inject 0.3 mLs (30 mg total) into the skin every 12 (twelve) hours.     magnesium gluconate 500 MG tablet  Commonly known as:  MAGONATE  Take 500 mg by mouth daily.     methocarbamol 500 MG tablet  Commonly known as:  ROBAXIN  Take 1 tablet (500 mg total) by mouth every 6 (six) hours as needed for muscle spasms.     metoprolol succinate 50 MG 24 hr tablet  Commonly known as:  TOPROL-XL  Take 25 mg by mouth 2 (two) times daily. Take with or immediately following a meal.     multivitamin with minerals Tabs tablet  Take 2 tablets by mouth daily.     OLIVE LEAF EXTRACT PO  Take 1 tablet by mouth daily.     olmesartan-hydrochlorothiazide 20-12.5 MG per tablet  Commonly known as:  BENICAR HCT  Take 1 tablet by mouth daily.     OVER THE COUNTER MEDICATION  Take 1 capsule by mouth daily. Berry extract     OVER THE COUNTER MEDICATION  Take 1 capsule by mouth daily. Vitamin d 5000 units w/ iodine 1031mcg     oxyCODONE-acetaminophen  10-325 MG per tablet  Commonly known as:  PERCOCET  Take 1-2 tablets by mouth every 4 (four) hours as needed for pain.     POMEGRANATE PO  Take 250 mg by mouth daily.     SAW PALMETTO PO  Take 500 mg by mouth daily.     triamcinolone 55 MCG/ACT nasal inhaler  Commonly known as:  NASACORT  Place 1 spray into the nose daily as needed (allergies).     VITAMIN E PO  Take 360 mg by mouth daily.     warfarin 5 MG tablet  Commonly known as:  COUMADIN  Take 2 tablets (10 mg total) by mouth daily.        Diagnostic Studies: Dg Chest 2 View  09/28/2013   CLINICAL DATA:  Preop for right knee replacement.  Nonsmoker.  EXAM: CHEST  2 VIEW  COMPARISON:  05/22/2011  FINDINGS: Moderate lower thoracic spondylosis. Mild convex right thoracic spine curvature. Midline trachea. Normal heart size and mediastinal contours. No pleural effusion or pneumothorax. Bibasilar scarring or subsegmental atelectasis. Remote left rib trauma.  IMPRESSION: No acute cardiopulmonary disease.   Electronically Signed   By: Abigail Miyamoto M.D.   On: 09/28/2013 12:18    Disposition: 03-Skilled Nursing Facility      Discharge Instructions   Call MD / Call 911    Complete by:  As directed   If you experience chest pain or shortness of breath, CALL 911 and be transported to the hospital emergency room.  If you develope a fever above 101 F, pus (white drainage) or increased drainage or redness at the wound, or calf pain, call your surgeon's office.     Constipation Prevention    Complete by:  As directed   Drink plenty of fluids.  Prune juice may be helpful.  You may use a stool softener, such as Colace (over the counter) 100 mg twice a day.  Use MiraLax (over the counter) for constipation as needed.     Diet - low sodium heart healthy    Complete by:  As directed      Increase activity slowly as tolerated    Complete by:  As  directed            Follow-up Information   Follow up with DALLDORF,PETER G, MD. Call in 2  weeks.   Specialty:  Orthopedic Surgery   Contact information:   [place] [address] [phone]        Signed: Rich Fuchs 10/08/2013, 1:15 PM

## 2013-10-08 NOTE — Progress Notes (Signed)
Patient ready for discharge. Called report to Willaim Rayas, nurse at [facility]. Transport will pick up patient soon.

## 2013-10-08 NOTE — Progress Notes (Signed)
ANTICOAGULATION CONSULT NOTE - Initial Consult  Pharmacy Consult for warfarin Indication: VTE prophylaxis and AFib  Allergies  Allergen Reactions  . Levaquin [Levofloxacin In D5w]     "Problems /w tendon & muscle tears"  . Lisinopril Cough    Patient Measurements: Height: 5\' 11"  (180.3 cm) Weight: 290 lb 9 oz (131.798 kg) IBW/kg (Calculated) : 75.3 Heparin Dosing Weight:  Vital Signs: Temp: 97.9 F (36.6 C) (10/08 0534) BP: 119/73 mmHg (10/08 0534) Pulse Rate: 84 (10/08 0915)  Labs:  Recent Labs  10/06/13 0646  10/06/13 1220 10/07/13 0820 10/08/13 0500  HGB  --   < > 14.9 13.7 13.2  HCT  --   --  44.9 41.8 40.2  PLT  --   --  170 182 162  APTT 28  --   --   --   --   LABPROT 15.2  --   --  16.1* 18.0*  INR 1.20  --   --  1.29 1.49  CREATININE  --   --  1.81* 1.76*  --   < > = values in this interval not displayed.  Estimated Creatinine Clearance: 55.6 ml/min (by C-G formula based on Cr of 1.76).   Medical History: Past Medical History  Diagnosis Date  . GI bleed 2010  . Hypertension   . Recurrent upper respiratory infection (URI)     completing treatment for sinus infection - with abx  . Depression     treated for depression when wife was very ill  . GI bleed     hx  . Diverticulosis     hx - no sx  . Dysrhythmia     hx A fib, on anticoagulants  . Arthritis     both knees and hands and left elbow, hip    Medications:  Prescriptions prior to admission  Medication Sig Dispense Refill  . ascorbic acid (VITAMIN C) 1000 MG tablet Take 1,000 mg by mouth daily.        Marland Kitchen aspirin 81 MG EC tablet Take 81 mg by mouth daily. Swallow whole.      . Coenzyme Q10 (CO Q 10) 100 MG CAPS Take 100 mg by mouth daily.      Marland Kitchen DHEA 50 MG TABS Take 50 mg by mouth daily.       . Lactobacillus (ACIDOPHILUS/BIFIDUS PO) Take 100 mg by mouth daily.      . magnesium gluconate (MAGONATE) 500 MG tablet Take 500 mg by mouth daily.      . metoprolol succinate (TOPROL-XL) 50 MG 24  hr tablet Take 25 mg by mouth 2 (two) times daily. Take with or immediately following a meal.      . Multiple Vitamin (MULTIVITAMIN WITH MINERALS) TABS Take 2 tablets by mouth daily.      Marland Kitchen OLIVE LEAF EXTRACT PO Take 1 tablet by mouth daily.        Marland Kitchen olmesartan-hydrochlorothiazide (BENICAR HCT) 20-12.5 MG per tablet Take 1 tablet by mouth daily.      Marland Kitchen OVER THE COUNTER MEDICATION Take 1 capsule by mouth daily. Berry extract       . OVER THE COUNTER MEDICATION Take 1 capsule by mouth daily. Vitamin d 5000 units w/ iodine 1057mcg       . oxyCODONE-acetaminophen (PERCOCET) 10-325 MG per tablet Take 1-2 tablets by mouth every 4 (four) hours as needed for pain.      . Pomegranate, [place] granatum, (POMEGRANATE PO) Take 250 mg by mouth daily.       Marland Kitchen  Saw Palmetto, Serenoa repens, (SAW PALMETTO PO) Take 500 mg by mouth daily.       Marland Kitchen VITAMIN E PO Take 360 mg by mouth daily.       Marland Kitchen warfarin (COUMADIN) 5 MG tablet Take 10 mg by mouth daily.      Marland Kitchen triamcinolone (NASACORT) 55 MCG/ACT nasal inhaler Place 1 spray into the nose daily as needed (allergies).         Assessment: 68 yo male on warfarin PTA for Atrial Fibrillation. PTA dose: 10 mg po daily.  Warfarin was held prior to TKA and was resumed 10/6, for AFib and post-op prophylaxis.  INR 1.49.  CBC is stable and wnl.   Goal of Therapy:  INR 2-3 Monitor platelets by anticoagulation protocol: Yes    Plan:  Warfarin 10 mg po x1 Daily INR Dc Lovenox when INR therapeutic    Hughes Better, PharmD, BCPS Clinical Pharmacist Pager: [phone] 10/08/2013 11:45 AM

## 2013-10-09 NOTE — Care Management Note (Signed)
CARE MANAGEMENT NOTE 10/09/2013  Patient:  Harry Foster, Harry Foster   Account Number:  [id number]  Date Initiated:  10/08/2013  Documentation initiated by:  Ricki Miller  Subjective/Objective Assessment:   68 yr old male admitted with right knee DJD, underwent right total knee arthroplasty.     Action/Plan:   Patient will need shortterm rehab at SNF, will go to [facility].   Anticipated DC Date:  10/08/2013   Anticipated DC Plan:  SKILLED NURSING FACILITY  In-house referral  Clinical Social Worker      DC Planning Services  CM consult      [facility] Choice  NA   Choice offered to / List presented to:  NA   DME arranged  NA        HH arranged  NA      Status of service:  Completed, signed off Medicare Important Message given?  NA - LOS <3 / Initial given by admissions (If response is "NO", the following Medicare IM given date fields will be blank) Date Medicare IM given:   Medicare IM given by:   Date Additional Medicare IM given:   Additional Medicare IM given by:    Discharge Disposition:  [place]  Per UR Regulation:  Reviewed for med. necessity/level of care/duration of stay  If discussed at [place] of Stay Meetings, dates discussed:

## 2013-10-12 ENCOUNTER — Encounter: Payer: Self-pay | Admitting: Internal Medicine

## 2013-10-12 ENCOUNTER — Non-Acute Institutional Stay (SKILLED_NURSING_FACILITY): Payer: Medicare Other | Admitting: Internal Medicine

## 2013-10-12 DIAGNOSIS — N179 Acute kidney failure, unspecified: Secondary | ICD-10-CM

## 2013-10-12 DIAGNOSIS — I482 Chronic atrial fibrillation, unspecified: Secondary | ICD-10-CM

## 2013-10-12 DIAGNOSIS — M1711 Unilateral primary osteoarthritis, right knee: Secondary | ICD-10-CM

## 2013-10-12 DIAGNOSIS — E785 Hyperlipidemia, unspecified: Secondary | ICD-10-CM

## 2013-10-12 DIAGNOSIS — I10 Essential (primary) hypertension: Secondary | ICD-10-CM

## 2013-10-12 NOTE — Progress Notes (Signed)
Patient ID: Harry Foster, male   DOB: Jun 26, 1945, 68 y.o.   MRN: [id number]     Facility: [facility]    PCP: [name], MD  Allergies  Allergen Reactions  . Levaquin [Levofloxacin In D5w]     "Problems /w tendon & muscle tears"  . Lisinopril Cough    Chief Complaint: new admission  HPI:  68 y/o male patient is here for STR after hospital admission from 10/06/13-10/08/13 with OA of right knee. He underwent right total knee arhtorplasty He is seen in his room today. His pain is under control. He has regular bowel movement. Denies any concerns. Walking with a walker and working good with therapy. He mentions that he would like to be off all supplements while in facility for rehabilitation  Review of Systems:  Constitutional: Negative for fever, chills, malaise/fatigue and diaphoresis.  HENT: Negative for congestion Respiratory: Negative for cough, sputum production, shortness of breath and wheezing.   Cardiovascular: Negative for chest pain, palpitations, orthopnea and leg swelling.  Gastrointestinal: Negative for heartburn, nausea, vomiting, abdominal pain.  Genitourinary: Negative for dysuria  Musculoskeletal: Negative for back pain, falls Skin: Negative for itching and rash.  Neurological: Negative for weakness,dizziness, tingling, focal weakness and headaches.  Psychiatric/Behavioral: Negative for depression.     Past Medical History  Diagnosis Date  . GI bleed 2010  . Hypertension   . Recurrent upper respiratory infection (URI)     completing treatment for sinus infection - with abx  . Depression     treated for depression when wife was very ill  . GI bleed     hx  . Diverticulosis     hx - no sx  . Dysrhythmia     hx A fib, on anticoagulants  . Arthritis     both knees and hands and left elbow, hip   Past Surgical History  Procedure Laterality Date  . Sinus exploration  1992    laser sinus surgery  . Treatment fistula  anal  1992  . Tonsillectomy      age 53  . Total knee arthroplasty  11/21/2010    Procedure: TOTAL KNEE ARTHROPLASTY;  Surgeon: Hessie Dibble, MD;  Location: [place];  Service: Orthopedics;  Laterality: Left;  . Cardioversion  02/01/2011    Procedure: CARDIOVERSION;  Surgeon: Sinclair Grooms, MD;  Location: [place];  Service: Cardiovascular;  Laterality: N/A;  . Total knee arthroplasty  10/09/2011    Procedure: TOTAL KNEE ARTHROPLASTY;  Surgeon: Hessie Dibble, MD;  Location: [place];  Service: Orthopedics;  Laterality: Left;  left total knee arthroplasty  . Total knee arthroplasty Right 10/06/2013    DR DALLDORF  . Total knee arthroplasty with revision components Right 10/06/2013    Procedure: TOTAL KNEE ARTHROPLASTY WITH REVISION COMPONENTS;  Surgeon: Hessie Dibble, MD;  Location: [place];  Service: Orthopedics;  Laterality: Right;   Social History:   reports that he has never smoked. He has never used smokeless tobacco. He reports that he does not drink alcohol or use illicit drugs.  Family History  Problem Relation Age of Onset  . Heart disease Mother   . Heart disease Father     Medications: Patient's Medications  New Prescriptions   No medications on file  Previous Medications   ASCORBIC ACID (VITAMIN C) 1000 MG TABLET    Take 1,000 mg by mouth daily.     ASPIRIN 81 MG EC TABLET    Take 81  mg by mouth daily. Swallow whole.   COENZYME Q10 (CO Q 10) 100 MG CAPS    Take 100 mg by mouth daily.   DHEA 50 MG TABS    Take 50 mg by mouth daily.    ENOXAPARIN (LOVENOX) 30 MG/0.3ML INJECTION    Inject 0.3 mLs (30 mg total) into the skin every 12 (twelve) hours.   LACTOBACILLUS (ACIDOPHILUS/BIFIDUS PO)    Take 100 mg by mouth daily.   MAGNESIUM GLUCONATE (MAGONATE) 500 MG TABLET    Take 500 mg by mouth daily.   METHOCARBAMOL (ROBAXIN) 500 MG TABLET    Take 1 tablet (500 mg total) by mouth every 6 (six) hours as needed for muscle spasms.   METOPROLOL SUCCINATE (TOPROL-XL) 50 MG 24 HR  TABLET    Take 25 mg by mouth 2 (two) times daily. Take with or immediately following a meal.   MULTIPLE VITAMIN (MULTIVITAMIN WITH MINERALS) TABS    Take 2 tablets by mouth daily.   OLIVE LEAF EXTRACT PO    Take 1 tablet by mouth daily.     OLMESARTAN-HYDROCHLOROTHIAZIDE (BENICAR HCT) 20-12.5 MG PER TABLET    Take 1 tablet by mouth daily.   OVER THE COUNTER MEDICATION    Take 1 capsule by mouth daily. Berry extract    OVER THE COUNTER MEDICATION    Take 1 capsule by mouth daily. Vitamin d 5000 units w/ iodine 1061mcg    OXYCODONE-ACETAMINOPHEN (PERCOCET) 10-325 MG PER TABLET    Take 1-2 tablets by mouth every 4 (four) hours as needed for pain.   POMEGRANATE, PUNICA GRANATUM, (POMEGRANATE PO)    Take 250 mg by mouth daily.    SAW PALMETTO, SERENOA REPENS, (SAW PALMETTO PO)    Take 500 mg by mouth daily.    TRIAMCINOLONE (NASACORT) 55 MCG/ACT NASAL INHALER    Place 1 spray into the nose daily as needed (allergies).    VITAMIN E PO    Take 360 mg by mouth daily.    WARFARIN (COUMADIN) 5 MG TABLET    Take 2 tablets (10 mg total) by mouth daily.  Modified Medications   No medications on file  Discontinued Medications   No medications on file     Physical Exam: Filed Vitals:   10/12/13 1809  BP: 115/86  Pulse: 71  Temp: 98.5 F (36.9 C)  Resp: 19    General- elderly male in no acute distress, overweight Head- atraumatic, normocephalic Eyes- PERRLA, EOMI, no pallor, no icterus, no discharge Neck- no lymphadenopathy Throat- moist mucus membrane Cardiovascular- normal s1,s2, no murmurs Respiratory- bilateral clear to auscultation, no wheeze, no rhonchi, no crackles, no use of accessory muscles Abdomen- bowel sounds present, soft, non tender Musculoskeletal- able to move all 4 extremities, using a walker, no leg edema Neurological- no focal deficit Skin- warm and dry, dressing on incision site dry and clean Psychiatry- alert and oriented to person, place and time, normal mood and  affect   Labs reviewed: Basic Metabolic Panel:  Recent Labs  09/28/13 1121 10/06/13 1220 10/07/13 0820  NA 140  --  138  K 4.5  --  4.6  CL 104  --  102  CO2 24  --  24  GLUCOSE 107*  --  162*  BUN 26*  --  28*  CREATININE 1.66* 1.81* 1.76*  CALCIUM 10.0  --  9.1   Liver Function Tests: No results found for this basename: AST, ALT, ALKPHOS, BILITOT, PROT, ALBUMIN,  in the last 8760 hours  No results found for this basename: LIPASE, AMYLASE,  in the last 8760 hours No results found for this basename: AMMONIA,  in the last 8760 hours CBC:  Recent Labs  09/28/13 1121 10/06/13 1220 10/07/13 0820 10/08/13 0500  WBC 6.3 9.6 8.5 7.8  NEUTROABS 4.1  --   --   --   HGB 16.2 14.9 13.7 13.2  HCT 47.0 44.9 41.8 40.2  MCV 93.4 93.9 95.7 95.5  PLT 190 170 182 162   Radiological Exams: Dg Chest 2 View  09/28/2013   CLINICAL DATA:  Preop for right knee replacement.  Nonsmoker.  EXAM: CHEST  2 VIEW  COMPARISON:  05/22/2011  FINDINGS: Moderate lower thoracic spondylosis. Mild convex right thoracic spine curvature. Midline trachea. Normal heart size and mediastinal contours. No pleural effusion or pneumothorax. Bibasilar scarring or subsegmental atelectasis. Remote left rib trauma.  IMPRESSION: No acute cardiopulmonary disease.   Electronically Signed   By: Abigail Miyamoto M.D.   On: 09/28/2013 12:18    Assessment/Plan  Right knee OA S/p TKA, has f/u with orthopedics. Continue lovenox with coumadin until inr is therapeutic for dvt prophylaxis. Will have him work with physical therapy and occupational therapy team to help with gait training and muscle strengthening exercises.fall precautions. Skin care. Encourage to be out of bed. Continue robaxin and percocet for pain. inr today 1.8. Continue coumadin 10 mg daily with lovenox. Monitor bowel movement  afib Rate controlled.continue toprol xl for rate control and coumadin. Recheck inr 10/15/13. D/c aspirin  HTN Continue benicar-hctz and  monitor bp, check bmp  Hyperlipidemia Wants his fish oil and co-q held for now. Will d/c it  ARF Monitor kidney function   Family/ staff Communication: reviewed care plan with patient and nursing supervisor   Goals of care: short term rehabilitation    Labs/tests ordered: cbc, cmp    Blanchie Serve, MD  [place] [phone] (Monday-Friday 8 am - 5 pm) [phone] (afterhours)

## 2013-10-15 ENCOUNTER — Other Ambulatory Visit: Payer: Self-pay | Admitting: *Deleted

## 2013-10-15 ENCOUNTER — Encounter: Payer: Self-pay | Admitting: Adult Health

## 2013-10-15 ENCOUNTER — Non-Acute Institutional Stay (SKILLED_NURSING_FACILITY): Payer: Medicare Other | Admitting: Adult Health

## 2013-10-15 DIAGNOSIS — I10 Essential (primary) hypertension: Secondary | ICD-10-CM

## 2013-10-15 DIAGNOSIS — I4819 Other persistent atrial fibrillation: Secondary | ICD-10-CM

## 2013-10-15 DIAGNOSIS — I481 Persistent atrial fibrillation: Secondary | ICD-10-CM

## 2013-10-15 DIAGNOSIS — M1711 Unilateral primary osteoarthritis, right knee: Secondary | ICD-10-CM

## 2013-10-15 MED ORDER — OXYCODONE-ACETAMINOPHEN 10-325 MG PO TABS: ORAL_TABLET | ORAL | Status: DC

## 2013-10-15 NOTE — Telephone Encounter (Signed)
[facility] 

## 2013-10-26 NOTE — Progress Notes (Signed)
Patient ID: Harry Foster, male   DOB: 1945-02-02, 68 y.o.   MRN: [id number]     [facility]  Allergies  Allergen Reactions  . Levaquin [Levofloxacin In D5w]     "Problems /w tendon & muscle tears"  . Lisinopril Cough    Chief Complaint  Patient presents with  . Discharge Note    HPI:  He is being discharged home. He will complete his therapy on an outpatient basis. He will not need dme. He will need his prescriptions to be written and will need a follow up with his pcp. He will need an inr on 10-19-13. He had been hospitalized for a right knee replacement.    Past Medical History  Diagnosis Date  . GI bleed 2010  . Hypertension   . Recurrent upper respiratory infection (URI)     completing treatment for sinus infection - with abx  . Depression     treated for depression when wife was very ill  . GI bleed     hx  . Diverticulosis     hx - no sx  . Dysrhythmia     hx A fib, on anticoagulants  . Arthritis     both knees and hands and left elbow, hip    Past Surgical History  Procedure Laterality Date  . Sinus exploration  1992    laser sinus surgery  . Treatment fistula anal  1992  . Tonsillectomy      age 67  . Total knee arthroplasty  11/21/2010    Procedure: TOTAL KNEE ARTHROPLASTY;  Surgeon: Hessie Dibble, MD;  Location: [place];  Service: Orthopedics;  Laterality: Left;  . Cardioversion  02/01/2011    Procedure: CARDIOVERSION;  Surgeon: Sinclair Grooms, MD;  Location: [place];  Service: Cardiovascular;  Laterality: N/A;  . Total knee arthroplasty  10/09/2011    Procedure: TOTAL KNEE ARTHROPLASTY;  Surgeon: Hessie Dibble, MD;  Location: [place];  Service: Orthopedics;  Laterality: Left;  left total knee arthroplasty  . Total knee arthroplasty Right 10/06/2013    DR DALLDORF  . Total knee arthroplasty with revision components Right 10/06/2013    Procedure: TOTAL KNEE ARTHROPLASTY WITH REVISION COMPONENTS;  Surgeon: Hessie Dibble, MD;  Location: [place];   Service: Orthopedics;  Laterality: Right;    VITAL SIGNS BP 112/66  Pulse 98  Ht 5\' 11"  (1.803 m)  Wt 242 lb (109.77 kg)  BMI 33.77 kg/m2   Patient's Medications  New Prescriptions   No medications on file  Previous Medications   ASCORBIC ACID (VITAMIN C) 1000 MG TABLET    Take 1,000 mg by mouth daily.     ASPIRIN 81 MG EC TABLET    Take 81 mg by mouth daily. Swallow whole.   COENZYME Q10 (CO Q 10) 100 MG CAPS    Take 100 mg by mouth daily.   DHEA 50 MG TABS    Take 50 mg by mouth daily.    ENOXAPARIN (LOVENOX) 30 MG/0.3ML INJECTION    Inject 0.3 mLs (30 mg total) into the skin every 12 (twelve) hours.   LACTOBACILLUS (ACIDOPHILUS/BIFIDUS PO)    Take 100 mg by mouth daily.   MAGNESIUM GLUCONATE (MAGONATE) 500 MG TABLET    Take 500 mg by mouth daily.   METHOCARBAMOL (ROBAXIN) 500 MG TABLET    Take 1 tablet (500 mg total) by mouth every 6 (six) hours as needed for muscle spasms.   METOPROLOL SUCCINATE (TOPROL-XL) 50 MG 24  HR TABLET    Take 25 mg by mouth 2 (two) times daily. Take with or immediately following a meal.   MULTIPLE VITAMIN (MULTIVITAMIN WITH MINERALS) TABS    Take 2 tablets by mouth daily.   OLIVE LEAF EXTRACT PO    Take 1 tablet by mouth daily.     OLMESARTAN-HYDROCHLOROTHIAZIDE (BENICAR HCT) 20-12.5 MG PER TABLET    Take 1 tablet by mouth daily.   OVER THE COUNTER MEDICATION    Take 1 capsule by mouth daily. Berry extract    OVER THE COUNTER MEDICATION    Take 1 capsule by mouth daily. Vitamin d 5000 units w/ iodine 1062mcg    OXYCODONE-ACETAMINOPHEN (PERCOCET) 10-325 MG PER TABLET    Take one to two tablets by mouth every 4 hours as needed for pain   POMEGRANATE, PUNICA GRANATUM, (POMEGRANATE PO)    Take 250 mg by mouth daily.    SAW PALMETTO, SERENOA REPENS, (SAW PALMETTO PO)    Take 500 mg by mouth daily.    TRIAMCINOLONE (NASACORT) 55 MCG/ACT NASAL INHALER    Place 1 spray into the nose daily as needed (allergies).    VITAMIN E PO    Take 360 mg by mouth daily.     WARFARIN (COUMADIN) 5 MG TABLET    Take 2 tablets (10 mg total) by mouth daily.  Modified Medications   No medications on file  Discontinued Medications   No medications on file      Labs reviewed: Basic Metabolic Panel:  Recent Labs  09/28/13 1121 10/06/13 1220 10/07/13 0820  NA 140  --  138  K 4.5  --  4.6  CL 104  --  102  CO2 24  --  24  GLUCOSE 107*  --  162*  BUN 26*  --  28*  CREATININE 1.66* 1.81* 1.76*  CALCIUM 10.0  --  9.1   Liver Function Tests: No results found for this basename: AST, ALT, ALKPHOS, BILITOT, PROT, ALBUMIN,  in the last 8760 hours No results found for this basename: LIPASE, AMYLASE,  in the last 8760 hours No results found for this basename: AMMONIA,  in the last 8760 hours CBC:  Recent Labs  09/28/13 1121 10/06/13 1220 10/07/13 0820 10/08/13 0500  WBC 6.3 9.6 8.5 7.8  NEUTROABS 4.1  --   --   --   HGB 16.2 14.9 13.7 13.2  HCT 47.0 44.9 41.8 40.2  MCV 93.4 93.9 95.7 95.5  PLT 190 170 182 162   10-12-13: wbc 4.7; hgb 12.0; hct 38.2; mcv 101.1; plt 218; glucose 104; bun 33; creat 1.4; k+4.8; na++139; liver normal albumin 3.2   Radiological Exams:   09/28/2013   chest x-ray:   IMPRESSION: No acute cardiopulmonary disease.          Review of Systems:  Constitutional: Negative for fever, chills, malaise/fatigue and diaphoresis.  HENT: Negative for congestion Respiratory: Negative for cough, sputum production, shortness of breath and wheezing.   Cardiovascular: Negative for chest pain, palpitations, orthopnea and leg swelling.  Gastrointestinal: Negative for heartburn, nausea, vomiting, abdominal pain.  Genitourinary: Negative for dysuria  Musculoskeletal: Negative for back pain, falls Skin: Negative for itching and rash.  Neurological: Negative for weakness,dizziness, tingling, focal weakness and headaches.  Psychiatric/Behavioral: Negative for depression.      General- elderly male in no acute distress, overweight Head-  atraumatic, normocephalic Eyes- PERRLA, EOMI, no pallor, no icterus, no discharge Neck- no lymphadenopathy Throat- moist mucus membrane Cardiovascular- normal s1,s2, no  murmurs Respiratory- bilateral clear to auscultation, no wheeze, no rhonchi, no crackles, no use of accessory muscles Abdomen- bowel sounds present, soft, non tender Musculoskeletal- able to move all 4 extremities, using a walker, no leg edema Neurological- no focal deficit Skin- warm and dry, dressing on incision site dry and clean Psychiatry- alert and oriented to person, place and time, normal mood and affect     ASSESSMENT/ PLAN:  Will discharge to home he will complete his therapy on an outpatient basis. He will not need dme. His prescriptions have been written for a 30 day supply of medications with #30 percocet 10/325 mg tabs. He has a follow up with his pcp Dr. [name] on 11-02-13 at 1030 am.   Time spent with patient 40 minutes.    Ok Edwards NP [facility]  Contact [phone] Monday through Friday 8am- 5pm  After hours call [phone]

## 2013-10-28 ENCOUNTER — Ambulatory Visit (INDEPENDENT_AMBULATORY_CARE_PROVIDER_SITE_OTHER): Payer: Medicare Other | Admitting: *Deleted

## 2013-10-28 DIAGNOSIS — Z5181 Encounter for therapeutic drug level monitoring: Secondary | ICD-10-CM

## 2013-10-28 DIAGNOSIS — I482 Chronic atrial fibrillation, unspecified: Secondary | ICD-10-CM

## 2013-10-28 LAB — POCT INR: INR: 3.1

## 2013-11-11 ENCOUNTER — Ambulatory Visit (INDEPENDENT_AMBULATORY_CARE_PROVIDER_SITE_OTHER): Payer: Medicare Other | Admitting: *Deleted

## 2013-11-11 DIAGNOSIS — Z5181 Encounter for therapeutic drug level monitoring: Secondary | ICD-10-CM

## 2013-11-11 DIAGNOSIS — I482 Chronic atrial fibrillation, unspecified: Secondary | ICD-10-CM

## 2013-11-11 LAB — POCT INR: INR: 2.3

## 2013-11-16 NOTE — Progress Notes (Signed)
Patient ID: Harry Foster, male   DOB: [birth year], 68 y.o.   MRN: [id number]     Allergies  Allergen Reactions  . Levaquin [Levofloxacin In D5w]     "Problems /w tendon & muscle tears"  . Lisinopril Cough       Chief Complaint  Patient presents with  . Acute Visit    coumadin management     HPI:    Past Medical History  Diagnosis Date  . GI bleed 2010  . Hypertension   . Recurrent upper respiratory infection (URI)     completing treatment for sinus infection - with abx  . Depression     treated for depression when wife was very ill  . GI bleed     hx  . Diverticulosis     hx - no sx  . Dysrhythmia     hx A fib, on anticoagulants  . Arthritis     both knees and hands and left elbow, hip    Past Surgical History  Procedure Laterality Date  . Sinus exploration  1992    laser sinus surgery  . Treatment fistula anal  1992  . Tonsillectomy      age 51  . Total knee arthroplasty  11/21/2010    Procedure: TOTAL KNEE ARTHROPLASTY;  Surgeon: Hessie Dibble, MD;  Location: [place];  Service: Orthopedics;  Laterality: Left;  . Cardioversion  02/01/2011    Procedure: CARDIOVERSION;  Surgeon: Sinclair Grooms, MD;  Location: [place];  Service: Cardiovascular;  Laterality: N/A;  . Total knee arthroplasty  10/09/2011    Procedure: TOTAL KNEE ARTHROPLASTY;  Surgeon: Hessie Dibble, MD;  Location: [place];  Service: Orthopedics;  Laterality: Left;  left total knee arthroplasty  . Total knee arthroplasty Right 10/06/2013    DR DALLDORF  . Total knee arthroplasty with revision components Right 10/06/2013    Procedure: TOTAL KNEE ARTHROPLASTY WITH REVISION COMPONENTS;  Surgeon: Hessie Dibble, MD;  Location: [place];  Service: Orthopedics;  Laterality: Right;    VITAL SIGNS BP 119/79 mmHg  Pulse 70  Ht 5\' 11"  (1.803 m)  Wt 242 lb (109.77 kg)  BMI 33.77 kg/m2   Outpatient Encounter Prescriptions as of 10/15/2013  Medication Sig  . ascorbic acid (VITAMIN C) 1000 MG tablet  Take 1,000 mg by mouth daily.    Marland Kitchen aspirin 81 MG EC tablet Take 81 mg by mouth daily. Swallow whole.  . Coenzyme Q10 (CO Q 10) 100 MG CAPS Take 100 mg by mouth daily.  Marland Kitchen DHEA 50 MG TABS Take 50 mg by mouth daily.   . Lactobacillus (ACIDOPHILUS/BIFIDUS PO) Take 100 mg by mouth daily.  . magnesium gluconate (MAGONATE) 500 MG tablet Take 500 mg by mouth daily.  . methocarbamol (ROBAXIN) 500 MG tablet Take 1 tablet (500 mg total) by mouth every 6 (six) hours as needed for muscle spasms.  . metoprolol succinate (TOPROL-XL) 50 MG 24 hr tablet Take 25 mg by mouth 2 (two) times daily. Take with or immediately following a meal.  . Multiple Vitamin (MULTIVITAMIN WITH MINERALS) TABS Take 2 tablets by mouth daily.  Marland Kitchen OLIVE LEAF EXTRACT PO Take 1 tablet by mouth daily.    Marland Kitchen olmesartan-hydrochlorothiazide (BENICAR HCT) 20-12.5 MG per tablet Take 1 tablet by mouth daily.  Marland Kitchen OVER THE COUNTER MEDICATION Take 1 capsule by mouth daily. Berry extract   . OVER THE COUNTER MEDICATION Take 1 capsule by mouth daily. Vitamin d 5000 units w/ iodine 1055mcg   .  oxyCODONE-acetaminophen (PERCOCET) 10-325 MG per tablet Take one to two tablets by mouth every 4 hours as needed for pain  . Pomegranate, Punica granatum, (POMEGRANATE PO) Take 250 mg by mouth daily.   . Saw Palmetto, Serenoa repens, (SAW PALMETTO PO) Take 500 mg by mouth daily.   Marland Kitchen triamcinolone (NASACORT) 55 MCG/ACT nasal inhaler Place 1 spray into the nose daily as needed (allergies).   Marland Kitchen VITAMIN E PO Take 360 mg by mouth daily.   Marland Kitchen warfarin (COUMADIN) 5 MG tablet Take 2 tablets (10 mg total) by mouth daily.     SIGNIFICANT DIAGNOSTIC EXAMS    ROS   Physical Exam     ASSESSMENT/ PLAN:    Ok Edwards NP [facility]  Contact [phone] Monday through Friday 8am- 5pm  After hours call [phone]   This encounter was created in error - please disregard.

## 2013-12-09 ENCOUNTER — Ambulatory Visit (INDEPENDENT_AMBULATORY_CARE_PROVIDER_SITE_OTHER): Payer: Medicare Other | Admitting: *Deleted

## 2013-12-09 DIAGNOSIS — I482 Chronic atrial fibrillation, unspecified: Secondary | ICD-10-CM

## 2013-12-09 DIAGNOSIS — Z5181 Encounter for therapeutic drug level monitoring: Secondary | ICD-10-CM

## 2013-12-09 LAB — POCT INR: INR: 2.2

## 2013-12-15 ENCOUNTER — Other Ambulatory Visit: Payer: Self-pay | Admitting: Interventional Cardiology

## 2014-01-06 ENCOUNTER — Ambulatory Visit (INDEPENDENT_AMBULATORY_CARE_PROVIDER_SITE_OTHER): Payer: Medicare Other | Admitting: *Deleted

## 2014-01-06 DIAGNOSIS — I482 Chronic atrial fibrillation, unspecified: Secondary | ICD-10-CM

## 2014-01-06 DIAGNOSIS — Z5181 Encounter for therapeutic drug level monitoring: Secondary | ICD-10-CM

## 2014-01-06 LAB — POCT INR: INR: 1.8

## 2014-01-14 ENCOUNTER — Encounter (HOSPITAL_COMMUNITY): Payer: Self-pay | Admitting: Orthopaedic Surgery

## 2014-01-27 ENCOUNTER — Ambulatory Visit (INDEPENDENT_AMBULATORY_CARE_PROVIDER_SITE_OTHER): Payer: Medicare Other | Admitting: *Deleted

## 2014-01-27 ENCOUNTER — Ambulatory Visit (INDEPENDENT_AMBULATORY_CARE_PROVIDER_SITE_OTHER): Payer: Medicare Other | Admitting: Interventional Cardiology

## 2014-01-27 ENCOUNTER — Encounter: Payer: Self-pay | Admitting: Interventional Cardiology

## 2014-01-27 VITALS — BP 126/96 | HR 69 | Ht 71.0 in | Wt 298.0 lb

## 2014-01-27 DIAGNOSIS — Z5181 Encounter for therapeutic drug level monitoring: Secondary | ICD-10-CM

## 2014-01-27 DIAGNOSIS — I4819 Other persistent atrial fibrillation: Secondary | ICD-10-CM

## 2014-01-27 DIAGNOSIS — I10 Essential (primary) hypertension: Secondary | ICD-10-CM

## 2014-01-27 DIAGNOSIS — I482 Chronic atrial fibrillation, unspecified: Secondary | ICD-10-CM

## 2014-01-27 DIAGNOSIS — I481 Persistent atrial fibrillation: Secondary | ICD-10-CM

## 2014-01-27 DIAGNOSIS — E785 Hyperlipidemia, unspecified: Secondary | ICD-10-CM

## 2014-01-27 DIAGNOSIS — Z7901 Long term (current) use of anticoagulants: Secondary | ICD-10-CM

## 2014-01-27 LAB — POCT INR: INR: 2.4

## 2014-01-27 NOTE — Patient Instructions (Signed)
Your physician recommends that you continue on your current medications as directed. Please refer to the Current Medication list given to you today.  Your physician discussed the importance of regular exercise and recommended that you start or continue a regular exercise program for good health.  Your physician wants you to follow-up in: 1 year with Dr.Smith You will receive a reminder letter in the mail two months in advance. If you don't receive a letter, please call our office to schedule the follow-up appointment.  

## 2014-01-27 NOTE — Progress Notes (Signed)
Patient ID: [name], male   DOB: 1945-12-09, 69 y.o.   MRN: [id number]    Cardiology Office Note   Date:  01/27/2014   ID:  JOEVANI Foster, DOB Oct 23, 1945, MRN [id number]  PCP:  [name], MD  Cardiologist:   Sinclair Grooms, MD   No chief complaint on file.     History of Present Illness: Harry Foster is a 69 y.o. male who presents for atrial fibrillation and chronic anticoagulation. He also has a history of hypertension. He has no cardiopulmonary complaints. Over the past 18 months he has had both knees operated upon. On chronic anticoagulation he has had no complications. He denies angina, dyspnea, and syncope.    Past Medical History  Diagnosis Date  . GI bleed 2010  . Hypertension   . Recurrent upper respiratory infection (URI)     completing treatment for sinus infection - with abx  . Depression     treated for depression when wife was very ill  . GI bleed     hx  . Diverticulosis     hx - no sx  . Dysrhythmia     hx A fib, on anticoagulants  . Arthritis     both knees and hands and left elbow, hip    Past Surgical History  Procedure Laterality Date  . Sinus exploration  1992    laser sinus surgery  . Treatment fistula anal  1992  . Tonsillectomy      age 55  . Cardioversion  02/01/2011    Procedure: CARDIOVERSION;  Surgeon: Sinclair Grooms, MD;  Location: [place];  Service: Cardiovascular;  Laterality: N/A;  . Total knee arthroplasty  10/09/2011    Procedure: TOTAL KNEE ARTHROPLASTY;  Surgeon: Hessie Dibble, MD;  Location: [place];  Service: Orthopedics;  Laterality: Left;  left total knee arthroplasty  . Total knee arthroplasty Right 10/06/2013    DR DALLDORF  . Total knee arthroplasty with revision components Right 10/06/2013    Procedure: TOTAL KNEE ARTHROPLASTY WITH REVISION COMPONENTS;  Surgeon: Hessie Dibble, MD;  Location: [place];  Service: Orthopedics;  Laterality: Right;     Current Outpatient Prescriptions  Medication Sig  Dispense Refill  . ascorbic acid (VITAMIN C) 1000 MG tablet Take 1,000 mg by mouth daily.      Marland Kitchen aspirin 81 MG EC tablet Take 81 mg by mouth daily. Swallow whole.    . Coenzyme Q10 (CO Q 10) 100 MG CAPS Take 100 mg by mouth daily.    Marland Kitchen DHEA 50 MG TABS Take 50 mg by mouth daily.     . Lactobacillus (ACIDOPHILUS/BIFIDUS PO) Take 100 mg by mouth daily.    . magnesium gluconate (MAGONATE) 500 MG tablet Take 500 mg by mouth daily.    . methocarbamol (ROBAXIN) 500 MG tablet Take 1 tablet (500 mg total) by mouth every 6 (six) hours as needed for muscle spasms. 50 tablet 0  . metoprolol succinate (TOPROL-XL) 50 MG 24 hr tablet Take 25 mg by mouth 2 (two) times daily. Take with or immediately following a meal.    . Multiple Vitamin (MULTIVITAMIN WITH MINERALS) TABS Take 2 tablets by mouth daily.    Marland Kitchen OLIVE LEAF EXTRACT PO Take 1 tablet by mouth daily.      Marland Kitchen olmesartan-hydrochlorothiazide (BENICAR HCT) 20-12.5 MG per tablet Take 1 tablet by mouth daily.    Marland Kitchen OVER THE COUNTER MEDICATION Take 1 capsule by mouth daily. Berry extract     .  OVER THE COUNTER MEDICATION Take 1 capsule by mouth daily. Vitamin d 5000 units w/ iodine 1068mcg     . oxyCODONE-acetaminophen (PERCOCET) 10-325 MG per tablet Take one to two tablets by mouth every 4 hours as needed for pain 360 tablet 0  . Pomegranate, Punica granatum, (POMEGRANATE PO) Take 250 mg by mouth daily.     . Saw Palmetto, Serenoa repens, (SAW PALMETTO PO) Take 500 mg by mouth daily.     Marland Kitchen triamcinolone (NASACORT) 55 MCG/ACT nasal inhaler Place 1 spray into the nose daily as needed (allergies).     Marland Kitchen VITAMIN E PO Take 360 mg by mouth daily.     Marland Kitchen warfarin (COUMADIN) 5 MG tablet TAKE 2 TABLETS BY MOUTH EVERY DAY OF THE WEEK OR AS DIRECTED BY COUMADIN CLINIC. 60 tablet 3   No current facility-administered medications for this visit.    Allergies:   Levaquin and Lisinopril    Social History:  The patient  reports that he has never smoked. He has never used  smokeless tobacco. He reports that he does not drink alcohol or use illicit drugs.   Family History:  The patient's family history includes Heart disease in his father and mother.    ROS:  Please see the history of present illness.   Otherwise, review of systems are positive for none.   All other systems are reviewed and negative.    PHYSICAL EXAM: VS:  BP 126/96 mmHg  Pulse 69  Ht 5\' 11"  (1.803 m)  Wt 298 lb (135.172 kg)  BMI 41.58 kg/m2 , BMI Body mass index is 41.58 kg/(m^2). GEN: Well nourished, well developed, in no acute distress HEENT: normal Neck: no JVD, carotid bruits, or masses Cardiac: RRR; no murmurs, rubs, or gallops,no edema  Respiratory:  clear to auscultation bilaterally, normal work of breathing GI: soft, nontender, nondistended, + BS MS: no deformity or atrophy Skin: warm and dry, no rash Neuro:  Strength and sensation are intact Psych: euthymic mood, full affect   EKG:  EKG is ordered today. The ekg ordered today demonstrates sinus bradycardia at 69 bpm with left atrial abnormality noted. No change from prior.   Recent Labs: 10/07/2013: BUN 28*; Creatinine 1.76*; Potassium 4.6; Sodium 138 10/08/2013: Hemoglobin 13.2; Platelets 162    Lipid Panel No results found for: CHOL, TRIG, HDL, CHOLHDL, VLDL, LDLCALC, LDLDIRECT    Wt Readings from Last 3 Encounters:  01/27/14 298 lb (135.172 kg)  10/15/13 242 lb (109.77 kg)  10/15/13 242 lb (109.77 kg)      Other studies Reviewed: Additional studies/ records that were reviewed today include: None. Review of the above records demonstrates: None   ASSESSMENT AND PLAN:  1.  History of atrial fibrillation, now holding normal sinus rhythm after cardioversion 3 years ago. Amiodarone has been discontinued without recurrence. 2. Hypertension, controlled 3. Chronic anticoagulation, warfarin   Current medicines are reviewed at length with the patient today.  The patient No concerns regarding medicines.  The  following changes have been made:  no change  Labs/ tests ordered today include: None  No orders of the defined types were placed in this encounter.     Disposition:   FU with Daneen Schick in 1 Year   Signed, Sinclair Grooms, MD  01/27/2014 2:14 PM    [address], [place], [place]  [address] Phone: [phone]; Fax: [phone]

## 2014-01-29 ENCOUNTER — Ambulatory Visit: Payer: Medicare Other | Admitting: Interventional Cardiology

## 2014-02-16 ENCOUNTER — Other Ambulatory Visit: Payer: Self-pay | Admitting: Interventional Cardiology

## 2014-02-17 NOTE — Telephone Encounter (Signed)
Belva Crome III, MD at 01/27/2014 2:13 PM  metoprolol succinate (TOPROL-XL) 50 MG 24 hr tabletTake 25 mg by mouth 2 (two) times daily.  Take with or immediately following a meal Current medicines are reviewed at length with the patient today. The patient No concerns regarding medicines.  The following changes have been made: no change

## 2014-02-24 ENCOUNTER — Ambulatory Visit (INDEPENDENT_AMBULATORY_CARE_PROVIDER_SITE_OTHER): Payer: Medicare Other | Admitting: Pharmacist

## 2014-02-24 DIAGNOSIS — I482 Chronic atrial fibrillation, unspecified: Secondary | ICD-10-CM

## 2014-02-24 DIAGNOSIS — I481 Persistent atrial fibrillation: Secondary | ICD-10-CM

## 2014-02-24 DIAGNOSIS — I4819 Other persistent atrial fibrillation: Secondary | ICD-10-CM

## 2014-02-24 DIAGNOSIS — Z5181 Encounter for therapeutic drug level monitoring: Secondary | ICD-10-CM

## 2014-02-24 LAB — POCT INR: INR: 2.2

## 2014-03-24 ENCOUNTER — Ambulatory Visit (INDEPENDENT_AMBULATORY_CARE_PROVIDER_SITE_OTHER): Payer: Medicare Other | Admitting: *Deleted

## 2014-03-24 DIAGNOSIS — Z5181 Encounter for therapeutic drug level monitoring: Secondary | ICD-10-CM

## 2014-03-24 DIAGNOSIS — I481 Persistent atrial fibrillation: Secondary | ICD-10-CM

## 2014-03-24 DIAGNOSIS — I4819 Other persistent atrial fibrillation: Secondary | ICD-10-CM

## 2014-03-24 DIAGNOSIS — I482 Chronic atrial fibrillation, unspecified: Secondary | ICD-10-CM

## 2014-03-24 LAB — POCT INR: INR: 2.9

## 2014-05-05 ENCOUNTER — Ambulatory Visit (INDEPENDENT_AMBULATORY_CARE_PROVIDER_SITE_OTHER): Payer: Medicare Other | Admitting: *Deleted

## 2014-05-05 DIAGNOSIS — I4819 Other persistent atrial fibrillation: Secondary | ICD-10-CM

## 2014-05-05 DIAGNOSIS — Z5181 Encounter for therapeutic drug level monitoring: Secondary | ICD-10-CM

## 2014-05-05 DIAGNOSIS — I482 Chronic atrial fibrillation, unspecified: Secondary | ICD-10-CM

## 2014-05-05 DIAGNOSIS — I481 Persistent atrial fibrillation: Secondary | ICD-10-CM

## 2014-05-05 LAB — POCT INR: INR: 2.8

## 2014-06-16 ENCOUNTER — Ambulatory Visit (INDEPENDENT_AMBULATORY_CARE_PROVIDER_SITE_OTHER): Payer: Medicare Other | Admitting: *Deleted

## 2014-06-16 DIAGNOSIS — I481 Persistent atrial fibrillation: Secondary | ICD-10-CM | POA: Diagnosis not present

## 2014-06-16 DIAGNOSIS — I482 Chronic atrial fibrillation, unspecified: Secondary | ICD-10-CM

## 2014-06-16 DIAGNOSIS — Z5181 Encounter for therapeutic drug level monitoring: Secondary | ICD-10-CM | POA: Diagnosis not present

## 2014-06-16 DIAGNOSIS — I4819 Other persistent atrial fibrillation: Secondary | ICD-10-CM

## 2014-06-16 LAB — POCT INR: INR: 3.3

## 2014-07-14 ENCOUNTER — Ambulatory Visit (INDEPENDENT_AMBULATORY_CARE_PROVIDER_SITE_OTHER): Payer: Medicare Other | Admitting: *Deleted

## 2014-07-14 DIAGNOSIS — I4819 Other persistent atrial fibrillation: Secondary | ICD-10-CM

## 2014-07-14 DIAGNOSIS — I482 Chronic atrial fibrillation, unspecified: Secondary | ICD-10-CM

## 2014-07-14 DIAGNOSIS — Z5181 Encounter for therapeutic drug level monitoring: Secondary | ICD-10-CM | POA: Diagnosis not present

## 2014-07-14 DIAGNOSIS — I481 Persistent atrial fibrillation: Secondary | ICD-10-CM | POA: Diagnosis not present

## 2014-07-14 LAB — POCT INR: INR: 3.2

## 2014-07-19 ENCOUNTER — Other Ambulatory Visit: Payer: Self-pay | Admitting: Interventional Cardiology

## 2014-07-30 ENCOUNTER — Ambulatory Visit (INDEPENDENT_AMBULATORY_CARE_PROVIDER_SITE_OTHER): Payer: Medicare Other | Admitting: *Deleted

## 2014-07-30 DIAGNOSIS — I482 Chronic atrial fibrillation, unspecified: Secondary | ICD-10-CM

## 2014-07-30 DIAGNOSIS — I4819 Other persistent atrial fibrillation: Secondary | ICD-10-CM

## 2014-07-30 DIAGNOSIS — Z5181 Encounter for therapeutic drug level monitoring: Secondary | ICD-10-CM

## 2014-07-30 DIAGNOSIS — I481 Persistent atrial fibrillation: Secondary | ICD-10-CM

## 2014-07-30 LAB — POCT INR: INR: 2.9

## 2014-08-02 ENCOUNTER — Ambulatory Visit (INDEPENDENT_AMBULATORY_CARE_PROVIDER_SITE_OTHER): Payer: Medicare Other | Admitting: Pharmacist

## 2014-08-02 DIAGNOSIS — I4819 Other persistent atrial fibrillation: Secondary | ICD-10-CM

## 2014-08-02 DIAGNOSIS — I482 Chronic atrial fibrillation, unspecified: Secondary | ICD-10-CM

## 2014-08-02 DIAGNOSIS — I481 Persistent atrial fibrillation: Secondary | ICD-10-CM

## 2014-08-02 DIAGNOSIS — Z5181 Encounter for therapeutic drug level monitoring: Secondary | ICD-10-CM

## 2014-08-02 LAB — POCT INR: INR: 3.6

## 2014-08-09 ENCOUNTER — Ambulatory Visit (INDEPENDENT_AMBULATORY_CARE_PROVIDER_SITE_OTHER): Payer: Medicare Other | Admitting: *Deleted

## 2014-08-09 DIAGNOSIS — Z5181 Encounter for therapeutic drug level monitoring: Secondary | ICD-10-CM | POA: Diagnosis not present

## 2014-08-09 DIAGNOSIS — I481 Persistent atrial fibrillation: Secondary | ICD-10-CM

## 2014-08-09 DIAGNOSIS — I482 Chronic atrial fibrillation, unspecified: Secondary | ICD-10-CM

## 2014-08-09 DIAGNOSIS — I4819 Other persistent atrial fibrillation: Secondary | ICD-10-CM

## 2014-08-09 LAB — POCT INR: INR: 3.6

## 2014-08-19 ENCOUNTER — Other Ambulatory Visit: Payer: Self-pay | Admitting: Interventional Cardiology

## 2014-08-23 ENCOUNTER — Ambulatory Visit (INDEPENDENT_AMBULATORY_CARE_PROVIDER_SITE_OTHER): Payer: Medicare Other | Admitting: Pharmacist

## 2014-08-23 DIAGNOSIS — I482 Chronic atrial fibrillation, unspecified: Secondary | ICD-10-CM

## 2014-08-23 DIAGNOSIS — Z5181 Encounter for therapeutic drug level monitoring: Secondary | ICD-10-CM | POA: Diagnosis not present

## 2014-08-23 DIAGNOSIS — I481 Persistent atrial fibrillation: Secondary | ICD-10-CM | POA: Diagnosis not present

## 2014-08-23 DIAGNOSIS — I4819 Other persistent atrial fibrillation: Secondary | ICD-10-CM

## 2014-08-23 LAB — POCT INR: INR: 2.3

## 2014-09-20 ENCOUNTER — Ambulatory Visit (INDEPENDENT_AMBULATORY_CARE_PROVIDER_SITE_OTHER): Payer: Medicare Other | Admitting: Pharmacist

## 2014-09-20 DIAGNOSIS — I482 Chronic atrial fibrillation, unspecified: Secondary | ICD-10-CM

## 2014-09-20 DIAGNOSIS — Z5181 Encounter for therapeutic drug level monitoring: Secondary | ICD-10-CM

## 2014-09-20 DIAGNOSIS — I4819 Other persistent atrial fibrillation: Secondary | ICD-10-CM

## 2014-09-20 DIAGNOSIS — I481 Persistent atrial fibrillation: Secondary | ICD-10-CM

## 2014-09-20 LAB — POCT INR: INR: 2.8

## 2014-10-18 ENCOUNTER — Ambulatory Visit (INDEPENDENT_AMBULATORY_CARE_PROVIDER_SITE_OTHER): Payer: Medicare Other | Admitting: Pharmacist

## 2014-10-18 DIAGNOSIS — Z5181 Encounter for therapeutic drug level monitoring: Secondary | ICD-10-CM

## 2014-10-18 DIAGNOSIS — I481 Persistent atrial fibrillation: Secondary | ICD-10-CM | POA: Diagnosis not present

## 2014-10-18 DIAGNOSIS — I482 Chronic atrial fibrillation, unspecified: Secondary | ICD-10-CM

## 2014-10-18 DIAGNOSIS — I4819 Other persistent atrial fibrillation: Secondary | ICD-10-CM

## 2014-10-18 LAB — POCT INR: INR: 2.6

## 2014-11-17 ENCOUNTER — Other Ambulatory Visit: Payer: Self-pay | Admitting: Interventional Cardiology

## 2014-11-29 ENCOUNTER — Ambulatory Visit (INDEPENDENT_AMBULATORY_CARE_PROVIDER_SITE_OTHER): Payer: Medicare Other

## 2014-11-29 DIAGNOSIS — I482 Chronic atrial fibrillation, unspecified: Secondary | ICD-10-CM

## 2014-11-29 DIAGNOSIS — Z5181 Encounter for therapeutic drug level monitoring: Secondary | ICD-10-CM | POA: Diagnosis not present

## 2014-11-29 DIAGNOSIS — I481 Persistent atrial fibrillation: Secondary | ICD-10-CM | POA: Diagnosis not present

## 2014-11-29 DIAGNOSIS — I4819 Other persistent atrial fibrillation: Secondary | ICD-10-CM

## 2014-11-29 LAB — POCT INR: INR: 3.3

## 2014-12-20 ENCOUNTER — Other Ambulatory Visit: Payer: Self-pay | Admitting: Interventional Cardiology

## 2015-01-04 ENCOUNTER — Ambulatory Visit (INDEPENDENT_AMBULATORY_CARE_PROVIDER_SITE_OTHER): Payer: Medicare Other | Admitting: *Deleted

## 2015-01-04 DIAGNOSIS — I482 Chronic atrial fibrillation, unspecified: Secondary | ICD-10-CM

## 2015-01-04 DIAGNOSIS — Z5181 Encounter for therapeutic drug level monitoring: Secondary | ICD-10-CM

## 2015-01-04 DIAGNOSIS — I481 Persistent atrial fibrillation: Secondary | ICD-10-CM | POA: Diagnosis not present

## 2015-01-04 DIAGNOSIS — I4819 Other persistent atrial fibrillation: Secondary | ICD-10-CM

## 2015-01-04 LAB — POCT INR: INR: 2.3

## 2015-02-15 ENCOUNTER — Ambulatory Visit (INDEPENDENT_AMBULATORY_CARE_PROVIDER_SITE_OTHER): Payer: Medicare Other | Admitting: *Deleted

## 2015-02-15 DIAGNOSIS — I482 Chronic atrial fibrillation, unspecified: Secondary | ICD-10-CM

## 2015-02-15 DIAGNOSIS — I481 Persistent atrial fibrillation: Secondary | ICD-10-CM

## 2015-02-15 DIAGNOSIS — Z5181 Encounter for therapeutic drug level monitoring: Secondary | ICD-10-CM | POA: Diagnosis not present

## 2015-02-15 DIAGNOSIS — I4819 Other persistent atrial fibrillation: Secondary | ICD-10-CM

## 2015-02-15 LAB — POCT INR: INR: 3.1

## 2015-03-07 ENCOUNTER — Ambulatory Visit (INDEPENDENT_AMBULATORY_CARE_PROVIDER_SITE_OTHER): Payer: Medicare Other | Admitting: Interventional Cardiology

## 2015-03-07 ENCOUNTER — Encounter: Payer: Self-pay | Admitting: Interventional Cardiology

## 2015-03-07 VITALS — BP 128/80 | HR 120 | Ht 71.5 in | Wt 309.0 lb

## 2015-03-07 DIAGNOSIS — I10 Essential (primary) hypertension: Secondary | ICD-10-CM

## 2015-03-07 DIAGNOSIS — I482 Chronic atrial fibrillation, unspecified: Secondary | ICD-10-CM

## 2015-03-07 DIAGNOSIS — E785 Hyperlipidemia, unspecified: Secondary | ICD-10-CM | POA: Diagnosis not present

## 2015-03-07 DIAGNOSIS — Z7901 Long term (current) use of anticoagulants: Secondary | ICD-10-CM

## 2015-03-07 MED ORDER — DIGOXIN 125 MCG PO TABS: 0.1250 mg | ORAL_TABLET | Freq: Every day | ORAL | Status: DC

## 2015-03-07 MED ORDER — METOPROLOL SUCCINATE ER 50 MG PO TB24: 50.0000 mg | ORAL_TABLET | Freq: Two times a day (BID) | ORAL | Status: DC

## 2015-03-07 NOTE — Progress Notes (Signed)
Cardiology Office Note   Date:  03/07/2015   ID:  CALCIFER Harry Foster, DOB 1945/05/23, MRN [id number]  PCP:  [name], MD  Cardiologist:  Sinclair Grooms, MD   Chief Complaint  Patient presents with  . Atrial Fibrillation    atrial fibrillation, persistent      History of Present Illness: Harry Foster is a 70 y.o. male who presents for Atrial fibrillation, hypertension, diastolic heart failure, history of GI bleeding, and osteoarthritis.  Patient states that he is doing well. He has been trying to exercise. Weight is been stable. No chest discomfort palpitations. ECG done today as a routine one year follow-up demonstrates atrial fibrillation with rapid ventricular response. He denies orthopnea, PND, edema, syncope, and chest discomfort.     Past Medical History  Diagnosis Date  . GI bleed 2010  . Hypertension   . Recurrent upper respiratory infection (URI)     completing treatment for sinus infection - with abx  . Depression     treated for depression when wife was very ill  . GI bleed     hx  . Diverticulosis     hx - no sx  . Dysrhythmia     hx A fib, on anticoagulants  . Arthritis     both knees and hands and left elbow, hip    Past Surgical History  Procedure Laterality Date  . Sinus exploration  1992    laser sinus surgery  . Treatment fistula anal  1992  . Tonsillectomy      age 61  . Cardioversion  02/01/2011    Procedure: CARDIOVERSION;  Surgeon: Sinclair Grooms, MD;  Location: [facility];  Service: Cardiovascular;  Laterality: N/A;  . Total knee arthroplasty  10/09/2011    Procedure: TOTAL KNEE ARTHROPLASTY;  Surgeon: Hessie Dibble, MD;  Location: [facility];  Service: Orthopedics;  Laterality: Left;  left total knee arthroplasty  . Total knee arthroplasty Right 10/06/2013    DR DALLDORF  . Total knee arthroplasty with revision components Right 10/06/2013    Procedure: TOTAL KNEE ARTHROPLASTY WITH REVISION COMPONENTS;  Surgeon: Hessie Dibble,  MD;  Location: [facility];  Service: Orthopedics;  Laterality: Right;     Current Outpatient Prescriptions  Medication Sig Dispense Refill  . ascorbic acid (VITAMIN C) 1000 MG tablet Take 1,000 mg by mouth daily.      Marland Kitchen aspirin 81 MG EC tablet Take 81 mg by mouth daily. Swallow whole.    . Coenzyme Q10 (CO Q 10) 100 MG CAPS Take 100 mg by mouth daily.    Marland Kitchen DHEA 50 MG TABS Take 50 mg by mouth daily.     . Lactobacillus (ACIDOPHILUS/BIFIDUS PO) Take 100 mg by mouth daily.    . Loperamide HCl (IMODIUM PO) Take 1 tablet by mouth daily.    . magnesium gluconate (MAGONATE) 500 MG tablet Take 500 mg by mouth daily.    . metoprolol succinate (TOPROL-XL) 50 MG 24 hr tablet TAKE 1/2 TABLET BY MOUTH TWICE A DAY. 30 tablet 11  . Multiple Vitamin (MULTIVITAMIN WITH MINERALS) TABS Take 2 tablets by mouth daily.    Marland Kitchen OLIVE LEAF EXTRACT PO Take 1 tablet by mouth daily.      Marland Kitchen olmesartan-hydrochlorothiazide (BENICAR HCT) 20-12.5 MG per tablet Take 1 tablet by mouth daily.    Marland Kitchen OVER THE COUNTER MEDICATION Take 1 capsule by mouth daily. Berry extract     . OVER THE COUNTER MEDICATION Take 1  capsule by mouth daily. Vitamin d 5000 units w/ iodine 1055mcg     . Pomegranate, Punica granatum, (POMEGRANATE PO) Take 250 mg by mouth daily.     . Saw Palmetto, Serenoa repens, (SAW PALMETTO PO) Take 500 mg by mouth daily.     Marland Kitchen triamcinolone (NASACORT) 55 MCG/ACT nasal inhaler Place 1 spray into both nostrils daily as needed (allergies).     Marland Kitchen VITAMIN E PO Take 360 mg by mouth daily.     Marland Kitchen warfarin (COUMADIN) 5 MG tablet Take as directed by coumadin clinic 60 tablet 3   No current facility-administered medications for this visit.    Allergies:   Levaquin and Lisinopril    Social History:  The patient  reports that he has never smoked. He has never used smokeless tobacco. He reports that he does not drink alcohol or use illicit drugs.   Family History:  The patient's family history includes Heart disease in his father  and mother.    ROS:  Please see the history of present illness.   Otherwise, review of systems are positive for No specific complaints other than exertional fatigue which he feels he is always had and which never got better even after atrial fibrillation was treated back to normal sinus rhythm..   All other systems are reviewed and negative.    PHYSICAL EXAM: VS:  BP 128/80 mmHg  Pulse 120  Ht 5' 11.5" (1.816 m)  Wt 309 lb (140.161 kg)  BMI 42.50 kg/m2 , BMI Body mass index is 42.5 kg/(m^2). GEN: Well nourished, well developed, in no acute distress HEENT: normal Neck: no JVD, carotid bruits, or masses Cardiac: IIRR.  There is  no murmur, rub, or gallop. There is nodema. Respiratory:  clear to auscultation bilaterally, normal work of breathing. GI: soft, nontender, nondistended, + BS MS: no deformity or atrophy Skin: warm and dry, no rash Neuro:  Strength and sensation are intact Psych: euthymic mood, full affect   EKG:  EKG is ordered today. The ekg reveals Atrial fibrillation with rapid ventricular response and nonspecific ST-T wave change.  Recent Labs: No results found for requested labs within last 365 days.    Lipid Panel No results found for: CHOL, TRIG, HDL, CHOLHDL, VLDL, LDLCALC, LDLDIRECT    Wt Readings from Last 3 Encounters:  03/07/15 309 lb (140.161 kg)  01/27/14 298 lb (135.172 kg)  10/15/13 242 lb (109.77 kg)      Other studies Reviewed: Additional studies/ records that were reviewed today include: Review of electronic health chart entries.. The findings include  No significant abnormalities or new injuries..    ASSESSMENT AND PLAN:  1. Recurrent atrial fibrillation (HCC) and with rapid ventricular response Patient is asymptomatic and duration of atrial fib is unknown. - EKG 12-Lead - Digoxin level; Future - Holter monitor - 48 hour; Future - Basic metabolic panel; Future - Echocardiogram; Future  2. Essential hypertension Controlled - EKG  [id number] - Basic metabolic panel; Future  3. Hyperlipidemia Not evaluated on this visit  4. Anticoagulation adequate Chronic anticoagulation therapy without complications    Current medicines are reviewed at length with the patient today.  The patient has the following concerns regarding medicines: Concerned about being restarted on amiodarone because of potential toxicity.  The following changes/actions have been instituted:    Digoxin 0.125 mg per day  Increased metoprolol succinate 50 mg twice a day  24-hour Holter monitor in 2 weeks  2-D Doppler echocardiogram in 2-3 weeks  Office visit in  one month  Basic metabolic panel and digoxin level in one month  Labs/ tests ordered today include:  No orders of the defined types were placed in this encounter.     Disposition:   FU with HS in 1 month  Signed, Sinclair Grooms, MD  03/07/2015 4:19 PM    [facility] [address], [place], [place]  [address] Phone: [phone]; Fax: [phone]

## 2015-03-07 NOTE — Patient Instructions (Signed)
Medication Instructions:  Your physician has recommended you make the following change in your medication:  1) INCREASE Metoprolol 50mg  twice daily 2) START Digoxin 0.125mg  daily   Labwork: Your physician recommends that you return for lab work in: 4 weeks (Bmet, Dig level)   Testing/Procedures: Your physician has recommended that you wear a holter monitor. Holter monitors are medical devices that record the heart's electrical activity. Doctors most often use these monitors to diagnose arrhythmias. Arrhythmias are problems with the speed or rhythm of the heartbeat. The monitor is a small, portable device. You can wear one while you do your normal daily activities. This is usually used to diagnose what is causing palpitations/syncope (passing out). (To be scheduled in 2 weeks)  Your physician has requested that you have an echocardiogram. Echocardiography is a painless test that uses sound waves to create images of your heart. It provides your doctor with information about the size and shape of your heart and how well your heart's chambers and valves are working. This procedure takes approximately one hour. There are no restrictions for this procedure. (To be scheduled in 2-3 weeks)  Follow-Up: Your physician recommends that you schedule a follow-up appointment in: 4 weeks with Dr.Smith 04/13/15 @ 3:15pm   Any Other Special Instructions Will Be Listed Below (If Applicable).     If you need a refill on your cardiac medications before your next appointment, please call your pharmacy.

## 2015-03-22 ENCOUNTER — Other Ambulatory Visit: Payer: Self-pay

## 2015-03-22 ENCOUNTER — Ambulatory Visit (HOSPITAL_COMMUNITY): Payer: Medicare Other | Attending: Cardiovascular Disease

## 2015-03-22 ENCOUNTER — Ambulatory Visit (INDEPENDENT_AMBULATORY_CARE_PROVIDER_SITE_OTHER): Payer: Medicare Other

## 2015-03-22 ENCOUNTER — Telehealth: Payer: Self-pay | Admitting: *Deleted

## 2015-03-22 DIAGNOSIS — I482 Chronic atrial fibrillation, unspecified: Secondary | ICD-10-CM

## 2015-03-22 DIAGNOSIS — Z6841 Body Mass Index (BMI) 40.0 and over, adult: Secondary | ICD-10-CM | POA: Insufficient documentation

## 2015-03-22 DIAGNOSIS — I119 Hypertensive heart disease without heart failure: Secondary | ICD-10-CM | POA: Diagnosis not present

## 2015-03-22 DIAGNOSIS — E785 Hyperlipidemia, unspecified: Secondary | ICD-10-CM | POA: Insufficient documentation

## 2015-03-22 NOTE — Telephone Encounter (Signed)
Pt states he started Levaquin last night so appt made for recheck in coumadin clinic on Thursdays March 23rd and he states understanding

## 2015-03-24 ENCOUNTER — Ambulatory Visit (INDEPENDENT_AMBULATORY_CARE_PROVIDER_SITE_OTHER): Payer: Medicare Other | Admitting: *Deleted

## 2015-03-24 DIAGNOSIS — I4819 Other persistent atrial fibrillation: Secondary | ICD-10-CM

## 2015-03-24 DIAGNOSIS — I482 Chronic atrial fibrillation, unspecified: Secondary | ICD-10-CM

## 2015-03-24 DIAGNOSIS — I481 Persistent atrial fibrillation: Secondary | ICD-10-CM | POA: Diagnosis not present

## 2015-03-24 DIAGNOSIS — Z5181 Encounter for therapeutic drug level monitoring: Secondary | ICD-10-CM | POA: Diagnosis not present

## 2015-03-24 LAB — POCT INR: INR: 2.5

## 2015-03-25 ENCOUNTER — Other Ambulatory Visit: Payer: Self-pay | Admitting: Interventional Cardiology

## 2015-04-13 ENCOUNTER — Other Ambulatory Visit (INDEPENDENT_AMBULATORY_CARE_PROVIDER_SITE_OTHER): Payer: Medicare Other

## 2015-04-13 ENCOUNTER — Ambulatory Visit (INDEPENDENT_AMBULATORY_CARE_PROVIDER_SITE_OTHER): Payer: Medicare Other | Admitting: *Deleted

## 2015-04-13 ENCOUNTER — Ambulatory Visit (INDEPENDENT_AMBULATORY_CARE_PROVIDER_SITE_OTHER): Payer: Medicare Other | Admitting: Interventional Cardiology

## 2015-04-13 ENCOUNTER — Encounter: Payer: Self-pay | Admitting: Interventional Cardiology

## 2015-04-13 VITALS — BP 124/76 | HR 80 | Ht 71.5 in | Wt 306.0 lb

## 2015-04-13 DIAGNOSIS — Z7901 Long term (current) use of anticoagulants: Secondary | ICD-10-CM | POA: Diagnosis not present

## 2015-04-13 DIAGNOSIS — I482 Chronic atrial fibrillation, unspecified: Secondary | ICD-10-CM

## 2015-04-13 DIAGNOSIS — Z5181 Encounter for therapeutic drug level monitoring: Secondary | ICD-10-CM

## 2015-04-13 DIAGNOSIS — E785 Hyperlipidemia, unspecified: Secondary | ICD-10-CM

## 2015-04-13 DIAGNOSIS — I10 Essential (primary) hypertension: Secondary | ICD-10-CM

## 2015-04-13 DIAGNOSIS — I481 Persistent atrial fibrillation: Secondary | ICD-10-CM

## 2015-04-13 DIAGNOSIS — I4819 Other persistent atrial fibrillation: Secondary | ICD-10-CM

## 2015-04-13 LAB — POCT INR: INR: 3.3

## 2015-04-13 MED ORDER — METOPROLOL SUCCINATE ER 100 MG PO TB24: ORAL_TABLET | ORAL | Status: DC

## 2015-04-13 MED ORDER — METOPROLOL SUCCINATE ER 50 MG PO TB24: ORAL_TABLET | ORAL | Status: DC

## 2015-04-13 NOTE — Patient Instructions (Signed)
Medication Instructions:  Your physician has recommended you make the following change in your medication:  INCREASE Metoprolol to 100mg  in the a.m.and 70m.g.in the p.m. An Rx has been sent to your pharmacy  Labwork: None ordered  Testing/Procedures: None ordered  Follow-Up: Your physician recommends that you schedule a follow-up appointment same day as your Coumadin appointment for a Nurse visit EKG    Any Other Special Instructions Will Be Listed Below (If Applicable).     If you need a refill on your cardiac medications before your next appointment, please call your pharmacy.

## 2015-04-13 NOTE — Progress Notes (Signed)
Cardiology Office Note   Date:  04/13/2015   ID:  Harry Foster, DOB 1945/08/06, MRN [id number]  PCP:  [name], MD  Cardiologist:  Sinclair Grooms, MD   Chief Complaint  Patient presents with  . Atrial Fibrillation      History of Present Illness: Harry Foster is a 70 y.o. male who presents for Back today for further management of atrial fibrillation. Holter monitor on therapy reveals average heart rate of 108 bpm. Metoprolol has been increased to 100 mg per day and Lanoxin has been added. Echo demonstrates EF of 35-40%. He has no dyspnea. He does have exertional fatigue. We have decided against rhythm control and are simply trying to control his ventricular response.    Past Medical History  Diagnosis Date  . GI bleed 2010  . Hypertension   . Recurrent upper respiratory infection (URI)     completing treatment for sinus infection - with abx  . Depression     treated for depression when wife was very ill  . GI bleed     hx  . Diverticulosis     hx - no sx  . Dysrhythmia     hx A fib, on anticoagulants  . Arthritis     both knees and hands and left elbow, hip    Past Surgical History  Procedure Laterality Date  . Sinus exploration  1992    laser sinus surgery  . Treatment fistula anal  1992  . Tonsillectomy      age 18  . Cardioversion  02/01/2011    Procedure: CARDIOVERSION;  Surgeon: Sinclair Grooms, MD;  Location: [facility];  Service: Cardiovascular;  Laterality: N/A;  . Total knee arthroplasty  10/09/2011    Procedure: TOTAL KNEE ARTHROPLASTY;  Surgeon: Hessie Dibble, MD;  Location: [facility];  Service: Orthopedics;  Laterality: Left;  left total knee arthroplasty  . Total knee arthroplasty Right 10/06/2013    DR DALLDORF  . Total knee arthroplasty with revision components Right 10/06/2013    Procedure: TOTAL KNEE ARTHROPLASTY WITH REVISION COMPONENTS;  Surgeon: Hessie Dibble, MD;  Location: [facility];  Service: Orthopedics;  Laterality: Right;      Current Outpatient Prescriptions  Medication Sig Dispense Refill  . ascorbic acid (VITAMIN C) 1000 MG tablet Take 1,000 mg by mouth daily.      Marland Kitchen aspirin 81 MG EC tablet Take 81 mg by mouth daily. Swallow whole.    . Coenzyme Q10 (CO Q 10) 100 MG CAPS Take 100 mg by mouth daily.    Marland Kitchen DHEA 50 MG TABS Take 50 mg by mouth daily.     . digoxin (LANOXIN) 0.125 MG tablet Take 1 tablet (0.125 mg total) by mouth daily. 30 tablet 11  . Lactobacillus (ACIDOPHILUS/BIFIDUS PO) Take 100 mg by mouth daily.    . Loperamide HCl (IMODIUM PO) Take 1 tablet by mouth daily.    . magnesium gluconate (MAGONATE) 500 MG tablet Take 500 mg by mouth daily.    . metoprolol succinate (TOPROL-XL) 50 MG 24 hr tablet Take 1 tablet (50 mg total) by mouth 2 (two) times daily. Take with or immediately following a meal. 60 tablet 11  . Multiple Vitamin (MULTIVITAMIN WITH MINERALS) TABS Take 2 tablets by mouth daily.    Marland Kitchen OLIVE LEAF EXTRACT PO Take 1 tablet by mouth daily.      Marland Kitchen olmesartan-hydrochlorothiazide (BENICAR HCT) 20-12.5 MG per tablet Take 1 tablet by mouth daily.    Marland Kitchen  OVER THE COUNTER MEDICATION Take 1 capsule by mouth daily. Berry extract     . OVER THE COUNTER MEDICATION Take 1 capsule by mouth daily. Vitamin d 5000 units w/ iodine 1044mcg     . Pomegranate, Punica granatum, (POMEGRANATE PO) Take 250 mg by mouth daily.     . Saw Palmetto, Serenoa repens, (SAW PALMETTO PO) Take 500 mg by mouth daily.     Marland Kitchen triamcinolone (NASACORT) 55 MCG/ACT nasal inhaler Place 1 spray into both nostrils daily as needed (allergies).     Marland Kitchen VITAMIN E PO Take 360 mg by mouth daily.     Marland Kitchen warfarin (COUMADIN) 5 MG tablet TAKE AS DIRECTED BY COUMADIN CLINIC 60 tablet 3   No current facility-administered medications for this visit.    Allergies:   Levaquin and Lisinopril    Social History:  The patient  reports that he has never smoked. He has never used smokeless tobacco. He reports that he does not drink alcohol or use  illicit drugs.   Family History:  The patient's family history includes Heart disease in his father and mother.    ROS:  Please see the history of present illness.   Otherwise, review of systems are positive for Fatigue..   All other systems are reviewed and negative.    PHYSICAL EXAM: VS:  BP 124/76 mmHg  Pulse 80  Ht 5' 11.5" (1.816 m)  Wt 306 lb (138.801 kg)  BMI 42.09 kg/m2 , BMI Body mass index is 42.09 kg/(m^2). GEN: Well nourished, well developed, in no acute distress HEENT: normal Neck: no JVD, carotid bruits, or masses Cardiac: IIRR.  There is no murmur, rub, or gallop. There is no edema. Respiratory:  clear to auscultation bilaterally, normal work of breathing. GI: soft, nontender, nondistended, + BS MS: no deformity or atrophy Skin: warm and dry, no rash Neuro:  Strength and sensation are intact Psych: euthymic mood, full affect   EKG:  EKG is not ordered today.    Recent Labs: No results found for requested labs within last 365 days.    Lipid Panel No results found for: CHOL, TRIG, HDL, CHOLHDL, VLDL, LDLCALC, LDLDIRECT    Wt Readings from Last 3 Encounters:  04/13/15 306 lb (138.801 kg)  03/07/15 309 lb (140.161 kg)  01/27/14 298 lb (135.172 kg)      Other studies Reviewed: Additional studies/ records that were reviewed today include: Reviewed Holter and echocardiogram. The findings include discuss results with patient.    ASSESSMENT AND PLAN:  1. Chronic atrial fibrillation (HCC) Duration on this occasion is unknown. Rate is still suboptimally controlled with an average heart rate on Holter of 104 bpm  2. Anticoagulation adequate On Coumadin and therapeutic  3. Hyperlipidemia Not reassessed  4. Essential hypertension Adequate control  5. Chronic combined systolic and diastolic heart failure I believe there is a persisting component related to poor rate control.    Current medicines are reviewed at length with the patient today.  The  patient has the following concerns regarding medicines: none.  The following changes/actions have been instituted:    Increase metoprolol succinate 100 mg every morning and 50 mg every afternoon  EKG when he returns for his next INR  Further adjustment in medications for rate control will be made based upon heart rate  Labs/ tests ordered today include:  No orders of the defined types were placed in this encounter.     Disposition:   FU with HS in 4 months  Signed,  Sinclair Grooms, MD  04/13/2015 3:31 PM    [facility] [place], [place], [place]  [address] Phone: [phone]; Fax: [phone]

## 2015-04-14 LAB — BASIC METABOLIC PANEL
BUN: 25 mg/dL (ref 7–25)
CALCIUM: 9.7 mg/dL (ref 8.6–10.3)
CO2: 25 mmol/L (ref 20–31)
CREATININE: 1.78 mg/dL — AB (ref 0.70–1.25)
Chloride: 105 mmol/L (ref 98–110)
GLUCOSE: 93 mg/dL (ref 65–99)
Potassium: 4.6 mmol/L (ref 3.5–5.3)
Sodium: 140 mmol/L (ref 135–146)

## 2015-04-14 LAB — DIGOXIN LEVEL

## 2015-05-09 ENCOUNTER — Ambulatory Visit (INDEPENDENT_AMBULATORY_CARE_PROVIDER_SITE_OTHER): Payer: Medicare Other | Admitting: Physician Assistant

## 2015-05-09 ENCOUNTER — Encounter: Payer: Self-pay | Admitting: Physician Assistant

## 2015-05-09 ENCOUNTER — Ambulatory Visit (INDEPENDENT_AMBULATORY_CARE_PROVIDER_SITE_OTHER): Payer: Medicare Other | Admitting: *Deleted

## 2015-05-09 VITALS — BP 138/90 | HR 104 | Ht 72.0 in | Wt 310.1 lb

## 2015-05-09 DIAGNOSIS — I10 Essential (primary) hypertension: Secondary | ICD-10-CM | POA: Diagnosis not present

## 2015-05-09 DIAGNOSIS — I482 Chronic atrial fibrillation, unspecified: Secondary | ICD-10-CM

## 2015-05-09 DIAGNOSIS — I4891 Unspecified atrial fibrillation: Secondary | ICD-10-CM | POA: Diagnosis not present

## 2015-05-09 DIAGNOSIS — I481 Persistent atrial fibrillation: Secondary | ICD-10-CM

## 2015-05-09 DIAGNOSIS — E669 Obesity, unspecified: Secondary | ICD-10-CM | POA: Diagnosis not present

## 2015-05-09 DIAGNOSIS — Z5181 Encounter for therapeutic drug level monitoring: Secondary | ICD-10-CM | POA: Diagnosis not present

## 2015-05-09 DIAGNOSIS — I4819 Other persistent atrial fibrillation: Secondary | ICD-10-CM

## 2015-05-09 LAB — POCT INR: INR: 4.1

## 2015-05-09 MED ORDER — METOPROLOL SUCCINATE ER 100 MG PO TB24
ORAL_TABLET | ORAL | Status: DC
Start: 2015-05-09 — End: 2015-05-09

## 2015-05-09 MED ORDER — METOPROLOL SUCCINATE ER 100 MG PO TB24: 100.0000 mg | ORAL_TABLET | Freq: Two times a day (BID) | ORAL | Status: DC

## 2015-05-09 MED ORDER — METOPROLOL SUCCINATE ER 50 MG PO TB24: 100.0000 mg | ORAL_TABLET | Freq: Two times a day (BID) | ORAL | Status: DC

## 2015-05-09 NOTE — Progress Notes (Signed)
Cardiology Office Note    Date:  05/09/2015   ID:  Harry Foster, DOB 1945-01-21, MRN [id number]  PCP:  [name], MD  Cardiologist:  Dr. Daneen Schick   CC: tachycardia  History of Present Illness:  Harry Foster is a 70 y.o. male of chronic atrial fibrillation. He saw Dr. Tamala Dobbs 04/13/15 his metoprolol was increased to 100 and the morning and 50 in the evening. Lanoxin was also added. He is simply trying to control his ventricular rate. Echo EF 35-40%.  Patient comes in today saying his heart rate is usually in the 80s after he sits down him takes it at home. It's 104 after walking in here today. He has a place on his face he thinks may need to be removed from the dermatologist this Thursday and was wondering if he could stop his Coumadin in case he has to have it removed. He's never seen a dermatologist before. Breathing is fine and he denies chest pain.     Past Medical History  Diagnosis Date  . GI bleed 2010  . Hypertension   . Recurrent upper respiratory infection (URI)     completing treatment for sinus infection - with abx  . Depression     treated for depression when wife was very ill  . GI bleed     hx  . Diverticulosis     hx - no sx  . Dysrhythmia     hx A fib, on anticoagulants  . Arthritis     both knees and hands and left elbow, hip    Past Surgical History  Procedure Laterality Date  . Sinus exploration  1992    laser sinus surgery  . Treatment fistula anal  1992  . Tonsillectomy      age 70  . Cardioversion  02/01/2011    Procedure: CARDIOVERSION;  Surgeon: Sinclair Grooms, MD;  Location: [facility];  Service: Cardiovascular;  Laterality: Harry Foster;  . Total knee arthroplasty  10/09/2011    Procedure: TOTAL KNEE ARTHROPLASTY;  Surgeon: Hessie Dibble, MD;  Location: [facility];  Service: Orthopedics;  Laterality: Left;  left total knee arthroplasty  . Total knee arthroplasty Right 10/06/2013    DR DALLDORF  . Total knee arthroplasty with revision  components Right 10/06/2013    Procedure: TOTAL KNEE ARTHROPLASTY WITH REVISION COMPONENTS;  Surgeon: Hessie Dibble, MD;  Location: [facility];  Service: Orthopedics;  Laterality: Right;    Current Medications: Outpatient Prescriptions Prior to Visit  Medication Sig Dispense Refill  . ascorbic acid (VITAMIN C) 1000 MG tablet Take 1,000 mg by mouth daily.      Marland Kitchen aspirin 81 MG EC tablet Take 81 mg by mouth daily. Swallow whole.    . Coenzyme Q10 (CO Q 10) 100 MG CAPS Take 100 mg by mouth daily.    Marland Kitchen DHEA 50 MG TABS Take 50 mg by mouth daily.     . digoxin (LANOXIN) 0.125 MG tablet Take 1 tablet (0.125 mg total) by mouth daily. 30 tablet 11  . Lactobacillus (ACIDOPHILUS/BIFIDUS PO) Take 100 mg by mouth daily.    . Loperamide HCl (IMODIUM PO) Take 1 tablet by mouth daily.    . magnesium gluconate (MAGONATE) 500 MG tablet Take 500 mg by mouth daily.    . Multiple Vitamin (MULTIVITAMIN WITH MINERALS) TABS Take 2 tablets by mouth daily.    Marland Kitchen OLIVE LEAF EXTRACT PO Take 1 tablet by mouth daily.      Marland Kitchen  olmesartan-hydrochlorothiazide (BENICAR HCT) 20-12.5 MG per tablet Take 1 tablet by mouth daily.    Marland Kitchen OVER THE COUNTER MEDICATION Take 1 capsule by mouth daily. Berry extract     . OVER THE COUNTER MEDICATION Take 1 capsule by mouth daily. Vitamin d 5000 units w/ iodine 1056mcg     . Pomegranate, Punica granatum, (POMEGRANATE PO) Take 250 mg by mouth daily.     . Saw Palmetto, Serenoa repens, (SAW PALMETTO PO) Take 500 mg by mouth daily.     Marland Kitchen triamcinolone (NASACORT) 55 MCG/ACT nasal inhaler Place 1 spray into both nostrils daily as needed (allergies).     Marland Kitchen VITAMIN E PO Take 360 mg by mouth daily.     Marland Kitchen warfarin (COUMADIN) 5 MG tablet TAKE AS DIRECTED BY COUMADIN CLINIC 60 tablet 3  . metoprolol succinate (TOPROL-XL) 50 MG 24 hr tablet Take 2 tablets (100mg ) in the morning and 1 tablet (50mg ) in the evening.Take with or immediately following a meal. 90 tablet 5   No facility-administered medications  prior to visit.     Allergies:   Levaquin and Lisinopril   Social History   Social History  . Marital Status: Widowed    Spouse Name: Harry Foster  . Number of Children: Harry Foster  . Years of Education: Harry Foster   Social History Main Topics  . Smoking status: Never Smoker   . Smokeless tobacco: Never Used  . Alcohol Use: No  . Drug Use: No  . Sexual Activity: Not Asked   Other Topics Concern  . None   Social History Narrative     Family History:  The patient's    family history includes Heart disease in his father and mother.   ROS:   Please see the history of present illness.    Review of Systems  Constitution: Negative.  HENT: Negative.   Cardiovascular: Positive for irregular heartbeat.  Respiratory: Negative.   Endocrine: Negative.   Hematologic/Lymphatic: Bruises/bleeds easily.  Musculoskeletal: Negative.  Negative for joint pain.  Gastrointestinal: Negative.   Genitourinary: Negative.   Neurological: Negative.    All other systems reviewed and are negative.   PHYSICAL EXAM:   VS:  BP 138/90 mmHg  Pulse 104  Ht 6' (1.829 m)  Wt 310 lb 1.9 oz (140.67 kg)  BMI 42.05 kg/m2   GEN: Obese, well developed, in no acute distress HEENT: normal Neck: no JVD, carotid bruits, or masses Cardiac: Irregular irregular; no murmurs, rubs, or gallops,no edema  Respiratory:  clear to auscultation bilaterally, normal work of breathing GI: soft, nontender, nondistended, + BS MS: no deformity or atrophy Skin: warm and dry, no rash Neuro:  Alert and Oriented x 3, Strength and sensation are intact Psych: euthymic mood, full affect  Wt Readings from Last 3 Encounters:  05/09/15 310 lb 1.9 oz (140.67 kg)  04/13/15 306 lb (138.801 kg)  03/07/15 309 lb (140.161 kg)      Studies/Labs Reviewed:   EKG:  EKG is  ordered today.  The ekg ordered today demonstrates  A. fib with RVR 104 bpm  Recent Labs: 04/13/2015: BUN 25; Creat 1.78*; Potassium 4.6; Sodium 140   Lipid Panel No results found  for: CHOL, TRIG, HDL, CHOLHDL, VLDL, LDLCALC, LDLDIRECT  Additional studies/ records that were reviewed today include:   2-D echo 03/22/15 Study Conclusions   - Left ventricle: The cavity size was normal. Wall thickness was   increased in a pattern of mild LVH. Systolic function was   moderately reduced. The estimated ejection  fraction was in the   range of 35% to 40%. - Left atrium: The atrium was mildly dilated.      ASSESSMENT:    1. Atrial fibrillation, unspecified type ([place])   2. Essential hypertension   3. Obesity      PLAN:  In order of problems listed above: Patient's atrial fibrillation is still uncontrolled. Will increase metoprolol 200 mg twice a day. Blood pressure is up today so hopefully this will help bring his blood pressure down as well. He does need to begin an exercise program and try to lose some weight because I think this will help with his tachycardia. Advised him not to hold his Coumadin unless he is told needs to have a lesion removed by the dermatologist. He then needs to be in touch with the Coumadin clinic.    Medication Adjustments/Labs and Tests Ordered: Current medicines are reviewed at length with the patient today.  Concerns regarding medicines are outlined above.  Medication changes, Labs and Tests ordered today are listed in the Patient Instructions below. Patient Instructions  Medication Instructions:   START TAKING METOPROLOL  100 MG  TWICE  A DAY   If you need a refill on your cardiac medications before your next appointment, please call your pharmacy.  Labwork: NONE ORDER TODAY    Testing/Procedures: NONE ORDER TODAY    Follow-Up: WITH DR Tamala Soltis IN 1 TO 2 MONTHS    Any Other Special Instructions Will Be Listed Below (If Applicable).                                                                                                                                                       Signed, Ermalinda Barrios, PA-C    05/09/2015 3:10 PM    [facility] [place], [place], [place]  [address] Phone: [phone]; Fax: [phone]

## 2015-05-09 NOTE — Patient Instructions (Addendum)
Medication Instructions:   START TAKING METOPROLOL  100 MG  TWICE  A DAY   If you need a refill on your cardiac medications before your next appointment, please call your pharmacy.  Labwork: NONE ORDER TODAY    Testing/Procedures: NONE ORDER TODAY    Follow-Up: WITH DR Tamala Golda IN 1 TO 2 MONTHS    Any Other Special Instructions Will Be Listed Below (If Applicable).

## 2015-05-10 ENCOUNTER — Telehealth: Payer: Self-pay | Admitting: Interventional Cardiology

## 2015-05-11 NOTE — Telephone Encounter (Signed)
Ignore

## 2015-05-23 ENCOUNTER — Ambulatory Visit (INDEPENDENT_AMBULATORY_CARE_PROVIDER_SITE_OTHER): Payer: Medicare Other | Admitting: *Deleted

## 2015-05-23 DIAGNOSIS — I482 Chronic atrial fibrillation, unspecified: Secondary | ICD-10-CM

## 2015-05-23 DIAGNOSIS — I4819 Other persistent atrial fibrillation: Secondary | ICD-10-CM

## 2015-05-23 DIAGNOSIS — I481 Persistent atrial fibrillation: Secondary | ICD-10-CM

## 2015-05-23 DIAGNOSIS — Z5181 Encounter for therapeutic drug level monitoring: Secondary | ICD-10-CM | POA: Diagnosis not present

## 2015-05-23 LAB — POCT INR: INR: 2.2

## 2015-05-31 ENCOUNTER — Other Ambulatory Visit: Payer: Self-pay | Admitting: Interventional Cardiology

## 2015-06-14 ENCOUNTER — Ambulatory Visit (INDEPENDENT_AMBULATORY_CARE_PROVIDER_SITE_OTHER): Payer: Medicare Other | Admitting: Pharmacist

## 2015-06-14 DIAGNOSIS — I482 Chronic atrial fibrillation, unspecified: Secondary | ICD-10-CM

## 2015-06-14 DIAGNOSIS — Z5181 Encounter for therapeutic drug level monitoring: Secondary | ICD-10-CM | POA: Diagnosis not present

## 2015-06-14 DIAGNOSIS — I481 Persistent atrial fibrillation: Secondary | ICD-10-CM

## 2015-06-14 DIAGNOSIS — I4819 Other persistent atrial fibrillation: Secondary | ICD-10-CM

## 2015-06-14 LAB — POCT INR: INR: 3.4

## 2015-07-07 ENCOUNTER — Ambulatory Visit (INDEPENDENT_AMBULATORY_CARE_PROVIDER_SITE_OTHER): Payer: Medicare Other | Admitting: *Deleted

## 2015-07-07 DIAGNOSIS — I482 Chronic atrial fibrillation, unspecified: Secondary | ICD-10-CM

## 2015-07-07 DIAGNOSIS — I481 Persistent atrial fibrillation: Secondary | ICD-10-CM

## 2015-07-07 DIAGNOSIS — I4819 Other persistent atrial fibrillation: Secondary | ICD-10-CM

## 2015-07-07 DIAGNOSIS — Z5181 Encounter for therapeutic drug level monitoring: Secondary | ICD-10-CM | POA: Diagnosis not present

## 2015-07-07 LAB — POCT INR: INR: 3.3

## 2015-07-28 ENCOUNTER — Ambulatory Visit (INDEPENDENT_AMBULATORY_CARE_PROVIDER_SITE_OTHER): Payer: Medicare Other | Admitting: *Deleted

## 2015-07-28 ENCOUNTER — Encounter: Payer: Self-pay | Admitting: Interventional Cardiology

## 2015-07-28 DIAGNOSIS — I481 Persistent atrial fibrillation: Secondary | ICD-10-CM

## 2015-07-28 DIAGNOSIS — I482 Chronic atrial fibrillation, unspecified: Secondary | ICD-10-CM

## 2015-07-28 DIAGNOSIS — Z5181 Encounter for therapeutic drug level monitoring: Secondary | ICD-10-CM | POA: Diagnosis not present

## 2015-07-28 DIAGNOSIS — I4819 Other persistent atrial fibrillation: Secondary | ICD-10-CM

## 2015-07-28 LAB — POCT INR: INR: 2.5

## 2015-08-16 ENCOUNTER — Encounter: Payer: Self-pay | Admitting: Interventional Cardiology

## 2015-08-16 ENCOUNTER — Ambulatory Visit (INDEPENDENT_AMBULATORY_CARE_PROVIDER_SITE_OTHER): Payer: Medicare Other | Admitting: Interventional Cardiology

## 2015-08-16 ENCOUNTER — Ambulatory Visit (INDEPENDENT_AMBULATORY_CARE_PROVIDER_SITE_OTHER): Payer: Medicare Other | Admitting: *Deleted

## 2015-08-16 ENCOUNTER — Encounter (INDEPENDENT_AMBULATORY_CARE_PROVIDER_SITE_OTHER): Payer: Self-pay

## 2015-08-16 VITALS — BP 128/90 | HR 80 | Ht 72.0 in | Wt 311.8 lb

## 2015-08-16 DIAGNOSIS — I4819 Other persistent atrial fibrillation: Secondary | ICD-10-CM

## 2015-08-16 DIAGNOSIS — I481 Persistent atrial fibrillation: Secondary | ICD-10-CM

## 2015-08-16 DIAGNOSIS — Z5181 Encounter for therapeutic drug level monitoring: Secondary | ICD-10-CM

## 2015-08-16 DIAGNOSIS — I482 Chronic atrial fibrillation, unspecified: Secondary | ICD-10-CM

## 2015-08-16 DIAGNOSIS — E785 Hyperlipidemia, unspecified: Secondary | ICD-10-CM | POA: Diagnosis not present

## 2015-08-16 DIAGNOSIS — I5189 Other ill-defined heart diseases: Secondary | ICD-10-CM

## 2015-08-16 DIAGNOSIS — I519 Heart disease, unspecified: Secondary | ICD-10-CM | POA: Diagnosis not present

## 2015-08-16 DIAGNOSIS — I10 Essential (primary) hypertension: Secondary | ICD-10-CM | POA: Diagnosis not present

## 2015-08-16 DIAGNOSIS — E669 Obesity, unspecified: Secondary | ICD-10-CM

## 2015-08-16 LAB — POCT INR: INR: 2.5

## 2015-08-16 NOTE — Progress Notes (Signed)
Cardiology Office Note    Date:  08/16/2015   ID:  Harry Foster, DOB [birth year], MRN [id number]  PCP:  [name], MD  Cardiologist: Sinclair Grooms, MD   Chief Complaint  Patient presents with  . Atrial Fibrillation  . Congestive Heart Failure    History of Present Illness:  Harry Foster is a 70 y.o. male follow-up of atrial fibrillation, hypertension, chronic diastolic heart failure, and chronic anticoagulation therapy.  We decided on rate control. We had difficult time controlling rate. Metoprolol and digoxin were used to achieve rate control. He now does relatively well. He does not have the endurance see had prior to settling for rate control. He is still active. He mows his own grass. Endurance is now stable but had less functional level than previous. No chest pain. No orthopnea PND. No head trauma. No blood in the urine or stool.  Past Medical History:  Diagnosis Date  . Arthritis    both knees and hands and left elbow, hip  . Depression    treated for depression when wife was very ill  . Diverticulosis    hx - no sx  . Dysrhythmia    hx A fib, on anticoagulants  . GI bleed 2010  . GI bleed    hx  . Hypertension   . Recurrent upper respiratory infection (URI)    completing treatment for sinus infection - with abx    Past Surgical History:  Procedure Laterality Date  . CARDIOVERSION  02/01/2011   Procedure: CARDIOVERSION;  Surgeon: Sinclair Grooms, MD;  Location: [facility];  Service: Cardiovascular;  Laterality: N/A;  . SINUS EXPLORATION  1992   laser sinus surgery  . TONSILLECTOMY     age 31  . TOTAL KNEE ARTHROPLASTY  10/09/2011   Procedure: TOTAL KNEE ARTHROPLASTY;  Surgeon: Hessie Dibble, MD;  Location: [facility];  Service: Orthopedics;  Laterality: Left;  left total knee arthroplasty  . TOTAL KNEE ARTHROPLASTY Right 10/06/2013   DR DALLDORF  . TOTAL KNEE ARTHROPLASTY WITH REVISION COMPONENTS Right 10/06/2013   Procedure: TOTAL KNEE ARTHROPLASTY  WITH REVISION COMPONENTS;  Surgeon: Hessie Dibble, MD;  Location: [facility];  Service: Orthopedics;  Laterality: Right;  . TREATMENT FISTULA ANAL  1992    Current Medications: Outpatient Medications Prior to Visit  Medication Sig Dispense Refill  . ascorbic acid (VITAMIN C) 1000 MG tablet Take 1,000 mg by mouth daily.      Marland Kitchen aspirin 81 MG EC tablet Take 81 mg by mouth daily. Swallow whole.    Marland Kitchen BENICAR HCT 20-12.5 MG tablet TAKE 1 TABLET BY MOUTH ONCE DAILY. 30 tablet 4  . Coenzyme Q10 (CO Q 10) 100 MG CAPS Take 100 mg by mouth daily.    Marland Kitchen DHEA 50 MG TABS Take 50 mg by mouth daily.     . digoxin (LANOXIN) 0.125 MG tablet Take 1 tablet (0.125 mg total) by mouth daily. 30 tablet 11  . Lactobacillus (ACIDOPHILUS/BIFIDUS PO) Take 100 mg by mouth daily.    . Loperamide HCl (IMODIUM PO) Take 1 tablet by mouth daily as needed (loose stools).     . magnesium gluconate (MAGONATE) 500 MG tablet Take 500 mg by mouth daily.    . metoprolol succinate (TOPROL-XL) 50 MG 24 hr tablet Take 2 tablets (100 mg total) by mouth 2 (two) times daily. Take with or immediately following a meal. 360 tablet 3  . Multiple Vitamin (MULTIVITAMIN WITH MINERALS) TABS Take  2 tablets by mouth daily.    Marland Kitchen OLIVE LEAF EXTRACT PO Take 1 tablet by mouth daily.      Marland Kitchen OVER THE COUNTER MEDICATION Take 1 capsule by mouth daily. Berry extract     . OVER THE COUNTER MEDICATION Take 1 capsule by mouth daily. Vitamin d 5000 units w/ iodine 1039mcg     . Pomegranate, Punica granatum, (POMEGRANATE PO) Take 250 mg by mouth daily.     . Saw Palmetto, Serenoa repens, (SAW PALMETTO PO) Take 500 mg by mouth daily.     Marland Kitchen triamcinolone (NASACORT) 55 MCG/ACT nasal inhaler Place 1 spray into both nostrils daily as needed (allergies).     Marland Kitchen VITAMIN E PO Take 360 mg by mouth daily.     Marland Kitchen warfarin (COUMADIN) 5 MG tablet TAKE AS DIRECTED BY COUMADIN CLINIC 60 tablet 3   No facility-administered medications prior to visit.      Allergies:   Levaquin  [levofloxacin in d5w] and Lisinopril   Social History   Social History  . Marital status: Widowed    Spouse name: N/A  . Number of children: N/A  . Years of education: N/A   Social History Main Topics  . Smoking status: Never Smoker  . Smokeless tobacco: Never Used  . Alcohol use No  . Drug use: No  . Sexual activity: Not Asked   Other Topics Concern  . None   Social History Narrative  . None     Family History:  The patient's family history includes Heart disease in his father and mother.   ROS:   Please see the history of present illness.    Decreased energy. Does not sleep well because of nocturia.  All other systems reviewed and are negative.   PHYSICAL EXAM:   VS:  BP 128/90   Pulse 80   Ht 6' (1.829 m)   Wt (!) 311 lb 12.8 oz (141.4 kg)   BMI 42.29 kg/m    GEN: Well nourished, well developed, in no acute distress  HEENT: normal  Neck: no JVD, carotid bruits, or masses Cardiac: IIRR; no murmurs, rubs, or gallops,no edema  Respiratory:  clear to auscultation bilaterally, normal work of breathing GI: soft, nontender, nondistended, + BS MS: no deformity or atrophy  Skin: warm and dry, no rash Neuro:  Alert and Oriented x 3, Strength and sensation are intact Psych: euthymic mood, full affect  Wt Readings from Last 3 Encounters:  08/16/15 (!) 311 lb 12.8 oz (141.4 kg)  05/09/15 (!) 310 lb 1.9 oz (140.7 kg)  04/13/15 (!) 306 lb (138.8 kg)      Studies/Labs Reviewed:   EKG:  EKG   Not repeated  Recent Labs: 04/13/2015: BUN 25; Creat 1.78; Potassium 4.6; Sodium 140   Lipid Panel No results found for: CHOL, TRIG, HDL, CHOLHDL, VLDL, LDLCALC, LDLDIRECT  Additional studies/ records that were reviewed today include:  Echocardiogram 03/2015: Study Conclusions  - Left ventricle: The cavity size was normal. Wall thickness was   increased in a pattern of mild LVH. Systolic function was   moderately reduced. The estimated ejection fraction was in the    range of 35% to 40%. - Left atrium: The atrium was mildly dilated.    ASSESSMENT:    1. Atrial fibrillation, persistent ([place])   2. Essential hypertension   3. Hyperlipidemia   4. Left ventricular systolic dysfunction   5. Encounter for therapeutic drug monitoring   6. Obesity      PLAN:  In order of problems listed above:  1. Rate is controlled based upon the patient's peripheral pulse. He has been stable since last seen. 2. Excellent blood pressure control. Low salt diet after complicated. 3. Followed by primary care 4. EF was in the 35% range 5 months ago. Now with rate control re-assess LV function and hopefully demonstrate improvement. No medication changes at this time. 5. Continue follow-up in Coumadin clinic 6. Weight losses have a complicated.    Medication Adjustments/Labs and Tests Ordered: Current medicines are reviewed at length with the patient today.  Concerns regarding medicines are outlined above.  Medication changes, Labs and Tests ordered today are listed in the Patient Instructions below. Patient Instructions  Medication Instructions:  Your physician recommends that you continue on your current medications as directed. Please refer to the Current Medication list given to you today.   Labwork: None ordered  Testing/Procedures: Your physician has requested that you have an echocardiogram. Echocardiography is a painless test that uses sound waves to create images of your heart. It provides your doctor with information about the size and shape of your heart and how well your heart's chambers and valves are working. This procedure takes approximately one hour. There are no restrictions for this procedure.    Follow-Up: Your physician wants you to follow-up in: 6 months with Dr.Smith You will receive a reminder letter in the mail two months in advance. If you don't receive a letter, please call our office to schedule the follow-up appointment.   Any Other  Special Instructions Will Be Listed Below (If Applicable).     If you need a refill on your cardiac medications before your next appointment, please call your pharmacy.      Signed, Sinclair Grooms, MD  08/16/2015 3:42 PM    [facility] [address], [place], [place]  [address] Phone: [phone]; Fax: [phone]

## 2015-08-16 NOTE — Patient Instructions (Addendum)
Medication Instructions:  Your physician recommends that you continue on your current medications as directed. Please refer to the Current Medication list given to you today.   Labwork: None ordered  Testing/Procedures: Your physician has requested that you have an echocardiogram. Echocardiography is a painless test that uses sound waves to create images of your heart. It provides your doctor with information about the size and shape of your heart and how well your heart's chambers and valves are working. This procedure takes approximately one hour. There are no restrictions for this procedure.    Follow-Up: Your physician wants you to follow-up in: 6 months with Dr.Smith You will receive a reminder letter in the mail two months in advance. If you don't receive a letter, please call our office to schedule the follow-up appointment.   Any Other Special Instructions Will Be Listed Below (If Applicable).     If you need a refill on your cardiac medications before your next appointment, please call your pharmacy.

## 2015-09-13 ENCOUNTER — Ambulatory Visit (INDEPENDENT_AMBULATORY_CARE_PROVIDER_SITE_OTHER): Payer: Medicare Other | Admitting: *Deleted

## 2015-09-13 ENCOUNTER — Ambulatory Visit (HOSPITAL_COMMUNITY): Payer: Medicare Other | Attending: Cardiology

## 2015-09-13 ENCOUNTER — Other Ambulatory Visit: Payer: Self-pay

## 2015-09-13 DIAGNOSIS — I4891 Unspecified atrial fibrillation: Secondary | ICD-10-CM | POA: Insufficient documentation

## 2015-09-13 DIAGNOSIS — I071 Rheumatic tricuspid insufficiency: Secondary | ICD-10-CM | POA: Insufficient documentation

## 2015-09-13 DIAGNOSIS — Z5181 Encounter for therapeutic drug level monitoring: Secondary | ICD-10-CM

## 2015-09-13 DIAGNOSIS — I519 Heart disease, unspecified: Secondary | ICD-10-CM

## 2015-09-13 DIAGNOSIS — I482 Chronic atrial fibrillation, unspecified: Secondary | ICD-10-CM

## 2015-09-13 DIAGNOSIS — I110 Hypertensive heart disease with heart failure: Secondary | ICD-10-CM | POA: Diagnosis not present

## 2015-09-13 DIAGNOSIS — I481 Persistent atrial fibrillation: Secondary | ICD-10-CM

## 2015-09-13 DIAGNOSIS — I4819 Other persistent atrial fibrillation: Secondary | ICD-10-CM

## 2015-09-13 DIAGNOSIS — I77810 Thoracic aortic ectasia: Secondary | ICD-10-CM | POA: Insufficient documentation

## 2015-09-13 LAB — POCT INR: INR: 3.2

## 2015-09-13 MED ORDER — PERFLUTREN LIPID MICROSPHERE
1.0000 mL | INTRAVENOUS | Status: AC | PRN
  Administered 2015-09-13: 3 mL via INTRAVENOUS

## 2015-10-04 ENCOUNTER — Ambulatory Visit (INDEPENDENT_AMBULATORY_CARE_PROVIDER_SITE_OTHER): Payer: Medicare Other | Admitting: *Deleted

## 2015-10-04 ENCOUNTER — Other Ambulatory Visit: Payer: Self-pay | Admitting: *Deleted

## 2015-10-04 DIAGNOSIS — I482 Chronic atrial fibrillation, unspecified: Secondary | ICD-10-CM

## 2015-10-04 DIAGNOSIS — I4819 Other persistent atrial fibrillation: Secondary | ICD-10-CM

## 2015-10-04 DIAGNOSIS — I481 Persistent atrial fibrillation: Secondary | ICD-10-CM

## 2015-10-04 DIAGNOSIS — Z5181 Encounter for therapeutic drug level monitoring: Secondary | ICD-10-CM | POA: Diagnosis not present

## 2015-10-04 LAB — POCT INR: INR: 2.4

## 2015-10-04 MED ORDER — OLMESARTAN MEDOXOMIL-HCTZ 20-12.5 MG PO TABS: 1.0000 | ORAL_TABLET | Freq: Every day | ORAL | 6 refills | Status: DC

## 2015-10-21 ENCOUNTER — Other Ambulatory Visit: Payer: Self-pay | Admitting: Interventional Cardiology

## 2015-10-25 ENCOUNTER — Ambulatory Visit (INDEPENDENT_AMBULATORY_CARE_PROVIDER_SITE_OTHER): Payer: Medicare Other | Admitting: *Deleted

## 2015-10-25 DIAGNOSIS — I482 Chronic atrial fibrillation, unspecified: Secondary | ICD-10-CM

## 2015-10-25 DIAGNOSIS — I4819 Other persistent atrial fibrillation: Secondary | ICD-10-CM

## 2015-10-25 DIAGNOSIS — I481 Persistent atrial fibrillation: Secondary | ICD-10-CM

## 2015-10-25 DIAGNOSIS — Z5181 Encounter for therapeutic drug level monitoring: Secondary | ICD-10-CM | POA: Diagnosis not present

## 2015-10-25 LAB — POCT INR: INR: 2.4

## 2015-11-29 ENCOUNTER — Ambulatory Visit (INDEPENDENT_AMBULATORY_CARE_PROVIDER_SITE_OTHER): Payer: Medicare Other

## 2015-11-29 DIAGNOSIS — I482 Chronic atrial fibrillation, unspecified: Secondary | ICD-10-CM

## 2015-11-29 DIAGNOSIS — I481 Persistent atrial fibrillation: Secondary | ICD-10-CM

## 2015-11-29 DIAGNOSIS — I4819 Other persistent atrial fibrillation: Secondary | ICD-10-CM

## 2015-11-29 DIAGNOSIS — Z5181 Encounter for therapeutic drug level monitoring: Secondary | ICD-10-CM

## 2015-11-29 LAB — POCT INR: INR: 1.9

## 2016-01-03 ENCOUNTER — Encounter (INDEPENDENT_AMBULATORY_CARE_PROVIDER_SITE_OTHER): Payer: Self-pay

## 2016-01-03 ENCOUNTER — Ambulatory Visit (INDEPENDENT_AMBULATORY_CARE_PROVIDER_SITE_OTHER): Payer: Medicare Other

## 2016-01-03 DIAGNOSIS — I482 Chronic atrial fibrillation, unspecified: Secondary | ICD-10-CM

## 2016-01-03 DIAGNOSIS — I4819 Other persistent atrial fibrillation: Secondary | ICD-10-CM

## 2016-01-03 DIAGNOSIS — Z5181 Encounter for therapeutic drug level monitoring: Secondary | ICD-10-CM | POA: Diagnosis not present

## 2016-01-03 DIAGNOSIS — I481 Persistent atrial fibrillation: Secondary | ICD-10-CM | POA: Diagnosis not present

## 2016-01-03 LAB — POCT INR: INR: 1.8

## 2016-01-31 ENCOUNTER — Ambulatory Visit (INDEPENDENT_AMBULATORY_CARE_PROVIDER_SITE_OTHER): Payer: Medicare Other | Admitting: *Deleted

## 2016-01-31 DIAGNOSIS — I482 Chronic atrial fibrillation, unspecified: Secondary | ICD-10-CM

## 2016-01-31 DIAGNOSIS — Z5181 Encounter for therapeutic drug level monitoring: Secondary | ICD-10-CM

## 2016-01-31 DIAGNOSIS — I481 Persistent atrial fibrillation: Secondary | ICD-10-CM | POA: Diagnosis not present

## 2016-01-31 DIAGNOSIS — I4819 Other persistent atrial fibrillation: Secondary | ICD-10-CM

## 2016-01-31 LAB — POCT INR: INR: 2.3

## 2016-02-09 ENCOUNTER — Other Ambulatory Visit: Payer: Self-pay | Admitting: Interventional Cardiology

## 2016-02-28 ENCOUNTER — Ambulatory Visit (INDEPENDENT_AMBULATORY_CARE_PROVIDER_SITE_OTHER): Payer: Medicare Other | Admitting: Pharmacist

## 2016-02-28 ENCOUNTER — Encounter (INDEPENDENT_AMBULATORY_CARE_PROVIDER_SITE_OTHER): Payer: Self-pay

## 2016-02-28 DIAGNOSIS — I482 Chronic atrial fibrillation, unspecified: Secondary | ICD-10-CM

## 2016-02-28 DIAGNOSIS — I481 Persistent atrial fibrillation: Secondary | ICD-10-CM

## 2016-02-28 DIAGNOSIS — Z5181 Encounter for therapeutic drug level monitoring: Secondary | ICD-10-CM | POA: Diagnosis not present

## 2016-02-28 DIAGNOSIS — I4819 Other persistent atrial fibrillation: Secondary | ICD-10-CM

## 2016-02-28 LAB — POCT INR: INR: 2.2

## 2016-03-01 ENCOUNTER — Encounter: Payer: Self-pay | Admitting: Interventional Cardiology

## 2016-03-07 ENCOUNTER — Encounter: Payer: Self-pay | Admitting: Interventional Cardiology

## 2016-03-07 ENCOUNTER — Ambulatory Visit (INDEPENDENT_AMBULATORY_CARE_PROVIDER_SITE_OTHER): Payer: Medicare Other | Admitting: Interventional Cardiology

## 2016-03-07 VITALS — BP 160/92 | HR 84 | Ht 71.0 in | Wt 313.4 lb

## 2016-03-07 DIAGNOSIS — Z7901 Long term (current) use of anticoagulants: Secondary | ICD-10-CM

## 2016-03-07 DIAGNOSIS — I10 Essential (primary) hypertension: Secondary | ICD-10-CM | POA: Diagnosis not present

## 2016-03-07 DIAGNOSIS — I4819 Other persistent atrial fibrillation: Secondary | ICD-10-CM

## 2016-03-07 DIAGNOSIS — E784 Other hyperlipidemia: Secondary | ICD-10-CM | POA: Diagnosis not present

## 2016-03-07 DIAGNOSIS — I519 Heart disease, unspecified: Secondary | ICD-10-CM

## 2016-03-07 DIAGNOSIS — I481 Persistent atrial fibrillation: Secondary | ICD-10-CM

## 2016-03-07 DIAGNOSIS — E7849 Other hyperlipidemia: Secondary | ICD-10-CM

## 2016-03-07 MED ORDER — OLMESARTAN MEDOXOMIL-HCTZ 40-12.5 MG PO TABS: 1.0000 | ORAL_TABLET | Freq: Every day | ORAL | 3 refills | Status: DC

## 2016-03-07 NOTE — Progress Notes (Signed)
Cardiology Office Note    Date:  03/07/2016   ID:  Harry Foster, DOB May 09, 1945, MRN [id number]  PCP:  Harry Screws, MD  Cardiologist: Harry Grooms, MD   Chief Complaint  Patient presents with  . Atrial Fibrillation    History of Present Illness:  Harry Foster is a 71 y.o. male follow-up of atrial fibrillation, hypertension, chronic diastolic heart failure, and chronic anticoagulation therapy.   Harry Foster is doing well. He denies dyspnea. States his blood pressure has been within normal range when checked usually, less than 140/90 mmHg. He denies orthopnea, PND, lower extremity swelling, and syncope. No palpitations. No chest discomfort.    Past Medical History:  Diagnosis Date  . Arthritis    both knees and hands and left elbow, hip  . Depression    treated for depression when wife was very ill  . Diverticulosis    hx - no sx  . Dysrhythmia    hx A fib, on anticoagulants  . GI bleed 2010  . GI bleed    hx  . Hypertension   . Recurrent upper respiratory infection (URI)    completing treatment for sinus infection - with abx    Past Surgical History:  Procedure Laterality Date  . CARDIOVERSION  02/01/2011   Procedure: CARDIOVERSION;  Surgeon: Harry Grooms, MD;  Location: [facility];  Service: Cardiovascular;  Laterality: N/A;  . SINUS EXPLORATION  1992   laser sinus surgery  . TONSILLECTOMY     age 48  . TOTAL KNEE ARTHROPLASTY  10/09/2011   Procedure: TOTAL KNEE ARTHROPLASTY;  Surgeon: Hessie Dibble, MD;  Location: [facility];  Service: Orthopedics;  Laterality: Left;  left total knee arthroplasty  . TOTAL KNEE ARTHROPLASTY Right 10/06/2013   DR DALLDORF  . TOTAL KNEE ARTHROPLASTY WITH REVISION COMPONENTS Right 10/06/2013   Procedure: TOTAL KNEE ARTHROPLASTY WITH REVISION COMPONENTS;  Surgeon: Hessie Dibble, MD;  Location: [facility];  Service: Orthopedics;  Laterality: Right;  . TREATMENT FISTULA ANAL  1992    Current Medications: Outpatient Medications  Prior to Visit  Medication Sig Dispense Refill  . ascorbic acid (VITAMIN C) 1000 MG tablet Take 1,000 mg by mouth daily.      Marland Kitchen aspirin 81 MG EC tablet Take 81 mg by mouth daily. Swallow whole.    . Coenzyme Q10 (CO Q 10) 100 MG CAPS Take 100 mg by mouth daily.    Marland Kitchen DHEA 50 MG TABS Take 50 mg by mouth daily.     Marland Kitchen DIGOX 125 MCG tablet TAKE 1 TABLET (0.125 MG TOTAL) BY MOUTH DAILY. 30 tablet 5  . Lactobacillus (ACIDOPHILUS/BIFIDUS PO) Take 100 mg by mouth daily.    . Loperamide HCl (IMODIUM PO) Take 1 tablet by mouth daily as needed (loose stools).     . magnesium gluconate (MAGONATE) 500 MG tablet Take 500 mg by mouth daily.    . metoprolol succinate (TOPROL-XL) 50 MG 24 hr tablet Take 2 tablets (100 mg total) by mouth 2 (two) times daily. Take with or immediately following a meal. 360 tablet 3  . Multiple Vitamin (MULTIVITAMIN WITH MINERALS) TABS Take 2 tablets by mouth daily.    Marland Kitchen OLIVE LEAF EXTRACT PO Take 1 tablet by mouth daily.      Marland Kitchen olmesartan-hydrochlorothiazide (BENICAR HCT) 20-12.5 MG tablet Take 1 tablet by mouth daily. 30 tablet 6  . OVER THE COUNTER MEDICATION Take 1 capsule by mouth daily. Berry extract     .  OVER THE COUNTER MEDICATION Take 1 capsule by mouth daily. Vitamin d 5000 units w/ iodine 1072mcg     . Pomegranate, Punica granatum, (POMEGRANATE PO) Take 250 mg by mouth daily.     . Saw Palmetto, Serenoa repens, (SAW PALMETTO PO) Take 500 mg by mouth daily.     Marland Kitchen triamcinolone (NASACORT) 55 MCG/ACT nasal inhaler Place 1 spray into both nostrils daily as needed (allergies).     Marland Kitchen VITAMIN E PO Take 360 mg by mouth daily.     Marland Kitchen warfarin (COUMADIN) 5 MG tablet Take as directed by coumadin clinic 60 tablet 3   No facility-administered medications prior to visit.      Allergies:   Levaquin [levofloxacin in d5w] and Lisinopril   Social History   Social History  . Marital status: Widowed    Spouse name: N/A  . Number of children: N/A  . Years of education: N/A    Social History Main Topics  . Smoking status: Never Smoker  . Smokeless tobacco: Never Used  . Alcohol use No  . Drug use: No  . Sexual activity: Not Asked   Other Topics Concern  . None   Social History Narrative  . None     Family History:  The patient's family history includes Heart disease in his father and mother.   ROS:   Please see the history of present illness.    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 Otherwise no complaints.  All other systems reviewed and are negative.   PHYSICAL EXAM:   VS:  BP (!) 160/92 (BP Location: Left Arm)   Pulse 84   Ht 5\' 11"  (1.803 m)   Wt (!) 313 lb 6.4 oz (142.2 kg)   BMI 43.71 kg/m    GEN: Well nourished, well developed, in no acute distress . Marked obesity. HEENT: normal  Neck: no JVD, carotid bruits, or masses Cardiac: IUIRR; no murmurs, rubs, or gallops,no edema  Respiratory:  clear to auscultation bilaterally, normal work of breathing GI: soft, nontender, nondistended, + BS MS: no deformity or atrophy  Skin: warm and dry, no rash Neuro:  Alert and Oriented x 3, Strength and sensation are intact Psych: euthymic mood, full affect  Wt Readings from Last 3 Encounters:  03/07/16 (!) 313 lb 6.4 oz (142.2 kg)  08/16/15 (!) 311 lb 12.8 oz (141.4 kg)  05/09/15 (!) 310 lb 1.9 oz (140.7 kg)      Studies/Labs Reviewed:   EKG:  EKG  Atrial fibrillation with ventricular response at 84 bpm. Relatively low voltage. No change when compared to prior with the exception that rate is much better controlled.  Recent Labs: 04/13/2015: BUN 25; Creat 1.78; Potassium 4.6; Sodium 140   Lipid Panel No results found for: CHOL, TRIG, HDL, CHOLHDL, VLDL, LDLCALC, LDLDIRECT  Additional studies/ records that were reviewed today include:  Review of laboratory dated demonstrates kidney function has been relatively stable over the past 3 years with creatinine in the 1.7 range.    ASSESSMENT:    1. Atrial fibrillation,  persistent ([place])   2. Essential hypertension   3. Left ventricular systolic dysfunction   4. Anticoagulation adequate   5. Other hyperlipidemia      PLAN:  In order of problems listed above:  1. Much better rate control now on 100 mg of metoprolol succinate twice a day. Continue current therapy. Continue anticoagulation therapy with warfarin. 2. Blood pressure is poorly controlled. I'm concerned about intracranial hemorrhage. Decrease blood pressure  by increasing Benicar HCT to 40/12.5 mg. Basic metabolic panel 2 weeks later. This will likely cause a slight bump in creatinine. If not significant, we will except. 3. Currently on adequate heart failure regimen including metoprolol succinate 200 mg daily and now olmesartan 40 mg daily. Follow kidney function closely. 4. Monitor for bleeding. Discussed the benefits of NOAC therapy with reference to decreased intracranial hemorrhage rate. The concern with the patient has cost. For the time being we will continue warfarin.  Local follow-up in one year. Heart rate is under good control. We will attempt to get better blood pressure control with the adjustment as noted above.  Medication Adjustments/Labs and Tests Ordered: Current medicines are reviewed at length with the patient today.  Concerns regarding medicines are outlined above.  Medication changes, Labs and Tests ordered today are listed in the Patient Instructions below. There are no Patient Instructions on file for this visit.   Signed, Harry Grooms, MD  03/07/2016 1:43 PM    [facility] [address], [place], [place]  [address] Phone: [phone]; Fax: [phone]

## 2016-03-07 NOTE — Patient Instructions (Signed)
Medication Instructions:  1) INCREASE Benicar to 40/12.5mg  once daily  Labwork: Your physician recommends that you return for lab work in: 2 weeks (BMET)   Testing/Procedures: None  Follow-Up: Your physician wants you to follow-up in: 1 year with Dr. Tamala Alfrey.  You will receive a reminder letter in the mail two months in advance. If you don't receive a letter, please call our office to schedule the follow-up appointment.   Any Other Special Instructions Will Be Listed Below (If Applicable).     If you need a refill on your cardiac medications before your next appointment, please call your pharmacy.

## 2016-03-08 ENCOUNTER — Other Ambulatory Visit: Payer: Self-pay | Admitting: Interventional Cardiology

## 2016-03-21 ENCOUNTER — Other Ambulatory Visit: Payer: Medicare Other | Admitting: *Deleted

## 2016-03-21 DIAGNOSIS — I10 Essential (primary) hypertension: Secondary | ICD-10-CM

## 2016-03-21 DIAGNOSIS — I4819 Other persistent atrial fibrillation: Secondary | ICD-10-CM

## 2016-03-21 NOTE — Progress Notes (Signed)
[id number] 

## 2016-03-22 LAB — BASIC METABOLIC PANEL
BUN/Creatinine Ratio: 16 (ref 10–24)
BUN: 27 mg/dL (ref 8–27)
CHLORIDE: 103 mmol/L (ref 96–106)
CO2: 26 mmol/L (ref 18–29)
Calcium: 10.2 mg/dL (ref 8.6–10.2)
Creatinine, Ser: 1.67 mg/dL — ABNORMAL HIGH (ref 0.76–1.27)
GFR calc non Af Amer: 41 mL/min/{1.73_m2} — ABNORMAL LOW (ref >59)
GFR, EST AFRICAN AMERICAN: 47 mL/min/{1.73_m2} — AB (ref >59)
GLUCOSE: 102 mg/dL — AB (ref 65–99)
POTASSIUM: 4.6 mmol/L (ref 3.5–5.2)
SODIUM: 143 mmol/L (ref 134–144)

## 2016-04-04 ENCOUNTER — Ambulatory Visit (INDEPENDENT_AMBULATORY_CARE_PROVIDER_SITE_OTHER): Payer: Medicare Other | Admitting: Pharmacist

## 2016-04-04 DIAGNOSIS — I481 Persistent atrial fibrillation: Secondary | ICD-10-CM | POA: Diagnosis not present

## 2016-04-04 DIAGNOSIS — Z5181 Encounter for therapeutic drug level monitoring: Secondary | ICD-10-CM | POA: Diagnosis not present

## 2016-04-04 DIAGNOSIS — I482 Chronic atrial fibrillation, unspecified: Secondary | ICD-10-CM

## 2016-04-04 DIAGNOSIS — I4819 Other persistent atrial fibrillation: Secondary | ICD-10-CM

## 2016-04-04 LAB — POCT INR: INR: 2.1

## 2016-05-16 ENCOUNTER — Ambulatory Visit (INDEPENDENT_AMBULATORY_CARE_PROVIDER_SITE_OTHER): Payer: Medicare Other | Admitting: *Deleted

## 2016-05-16 DIAGNOSIS — I482 Chronic atrial fibrillation, unspecified: Secondary | ICD-10-CM

## 2016-05-16 DIAGNOSIS — Z5181 Encounter for therapeutic drug level monitoring: Secondary | ICD-10-CM | POA: Diagnosis not present

## 2016-05-16 DIAGNOSIS — I4819 Other persistent atrial fibrillation: Secondary | ICD-10-CM

## 2016-05-16 DIAGNOSIS — I481 Persistent atrial fibrillation: Secondary | ICD-10-CM | POA: Diagnosis not present

## 2016-05-16 LAB — POCT INR: INR: 1.9

## 2016-05-31 ENCOUNTER — Other Ambulatory Visit: Payer: Self-pay | Admitting: Physician Assistant

## 2016-06-13 ENCOUNTER — Ambulatory Visit (INDEPENDENT_AMBULATORY_CARE_PROVIDER_SITE_OTHER): Payer: Medicare Other | Admitting: Pharmacist

## 2016-06-13 DIAGNOSIS — I481 Persistent atrial fibrillation: Secondary | ICD-10-CM

## 2016-06-13 DIAGNOSIS — Z5181 Encounter for therapeutic drug level monitoring: Secondary | ICD-10-CM

## 2016-06-13 DIAGNOSIS — I4819 Other persistent atrial fibrillation: Secondary | ICD-10-CM

## 2016-06-13 DIAGNOSIS — I482 Chronic atrial fibrillation, unspecified: Secondary | ICD-10-CM

## 2016-06-13 LAB — POCT INR: INR: 3.3

## 2016-07-11 ENCOUNTER — Ambulatory Visit (INDEPENDENT_AMBULATORY_CARE_PROVIDER_SITE_OTHER): Payer: Medicare Other | Admitting: Pharmacist

## 2016-07-11 DIAGNOSIS — I481 Persistent atrial fibrillation: Secondary | ICD-10-CM | POA: Diagnosis not present

## 2016-07-11 DIAGNOSIS — I482 Chronic atrial fibrillation, unspecified: Secondary | ICD-10-CM

## 2016-07-11 DIAGNOSIS — Z5181 Encounter for therapeutic drug level monitoring: Secondary | ICD-10-CM | POA: Diagnosis not present

## 2016-07-11 DIAGNOSIS — I4819 Other persistent atrial fibrillation: Secondary | ICD-10-CM

## 2016-07-11 LAB — POCT INR: INR: 2.3

## 2016-08-08 ENCOUNTER — Ambulatory Visit (INDEPENDENT_AMBULATORY_CARE_PROVIDER_SITE_OTHER): Payer: Medicare Other

## 2016-08-08 DIAGNOSIS — Z5181 Encounter for therapeutic drug level monitoring: Secondary | ICD-10-CM

## 2016-08-08 DIAGNOSIS — I481 Persistent atrial fibrillation: Secondary | ICD-10-CM | POA: Diagnosis not present

## 2016-08-08 DIAGNOSIS — I482 Chronic atrial fibrillation, unspecified: Secondary | ICD-10-CM

## 2016-08-08 DIAGNOSIS — I4819 Other persistent atrial fibrillation: Secondary | ICD-10-CM

## 2016-08-08 LAB — POCT INR: INR: 2.4

## 2016-09-01 ENCOUNTER — Other Ambulatory Visit: Payer: Self-pay | Admitting: Interventional Cardiology

## 2016-09-12 ENCOUNTER — Ambulatory Visit (INDEPENDENT_AMBULATORY_CARE_PROVIDER_SITE_OTHER): Payer: Medicare Other | Admitting: *Deleted

## 2016-09-12 DIAGNOSIS — Z5181 Encounter for therapeutic drug level monitoring: Secondary | ICD-10-CM | POA: Diagnosis not present

## 2016-09-12 DIAGNOSIS — I482 Chronic atrial fibrillation, unspecified: Secondary | ICD-10-CM

## 2016-09-12 DIAGNOSIS — I481 Persistent atrial fibrillation: Secondary | ICD-10-CM

## 2016-09-12 DIAGNOSIS — I4819 Other persistent atrial fibrillation: Secondary | ICD-10-CM

## 2016-09-12 LAB — POCT INR: INR: 2.8

## 2016-10-24 ENCOUNTER — Ambulatory Visit (INDEPENDENT_AMBULATORY_CARE_PROVIDER_SITE_OTHER): Payer: Medicare Other | Admitting: *Deleted

## 2016-10-24 DIAGNOSIS — Z5181 Encounter for therapeutic drug level monitoring: Secondary | ICD-10-CM

## 2016-10-24 DIAGNOSIS — I481 Persistent atrial fibrillation: Secondary | ICD-10-CM | POA: Diagnosis not present

## 2016-10-24 DIAGNOSIS — I482 Chronic atrial fibrillation, unspecified: Secondary | ICD-10-CM

## 2016-10-24 DIAGNOSIS — I4819 Other persistent atrial fibrillation: Secondary | ICD-10-CM

## 2016-10-24 LAB — POCT INR: INR: 3.7

## 2016-11-14 ENCOUNTER — Ambulatory Visit (INDEPENDENT_AMBULATORY_CARE_PROVIDER_SITE_OTHER): Payer: Medicare Other | Admitting: *Deleted

## 2016-11-14 DIAGNOSIS — I482 Chronic atrial fibrillation, unspecified: Secondary | ICD-10-CM

## 2016-11-14 DIAGNOSIS — I481 Persistent atrial fibrillation: Secondary | ICD-10-CM | POA: Diagnosis not present

## 2016-11-14 DIAGNOSIS — I4819 Other persistent atrial fibrillation: Secondary | ICD-10-CM

## 2016-11-14 DIAGNOSIS — Z5181 Encounter for therapeutic drug level monitoring: Secondary | ICD-10-CM | POA: Diagnosis not present

## 2016-11-14 LAB — POCT INR: INR: 3.1

## 2016-11-14 NOTE — Patient Instructions (Addendum)
Do not take coumadin today Nov 14th then continue same dosage 2 tablets daily except 1 tablet on Mondays, Wednesdays, and Fridays. Recheck INR in 3 weeks. But pt states will come back in 4 weeks

## 2016-12-12 ENCOUNTER — Ambulatory Visit (INDEPENDENT_AMBULATORY_CARE_PROVIDER_SITE_OTHER): Payer: Medicare Other | Admitting: *Deleted

## 2016-12-12 DIAGNOSIS — I482 Chronic atrial fibrillation, unspecified: Secondary | ICD-10-CM

## 2016-12-12 DIAGNOSIS — Z5181 Encounter for therapeutic drug level monitoring: Secondary | ICD-10-CM | POA: Diagnosis not present

## 2016-12-12 DIAGNOSIS — I481 Persistent atrial fibrillation: Secondary | ICD-10-CM

## 2016-12-12 DIAGNOSIS — I4819 Other persistent atrial fibrillation: Secondary | ICD-10-CM

## 2016-12-12 LAB — POCT INR: INR: 2.7

## 2016-12-12 NOTE — Patient Instructions (Addendum)
  Description   Continue same dosage 2 tablets daily except 1 tablet on Mondays, Wednesdays, and Fridays. Recheck INR in 4 weeks.

## 2017-01-09 ENCOUNTER — Ambulatory Visit: Payer: Medicare Other

## 2017-01-09 DIAGNOSIS — I482 Chronic atrial fibrillation, unspecified: Secondary | ICD-10-CM

## 2017-01-09 DIAGNOSIS — Z5181 Encounter for therapeutic drug level monitoring: Secondary | ICD-10-CM | POA: Diagnosis not present

## 2017-01-09 DIAGNOSIS — I481 Persistent atrial fibrillation: Secondary | ICD-10-CM

## 2017-01-09 DIAGNOSIS — I4819 Other persistent atrial fibrillation: Secondary | ICD-10-CM

## 2017-01-09 LAB — POCT INR: INR: 2.4

## 2017-01-09 NOTE — Patient Instructions (Signed)
Description   Continue same dosage 2 tablets daily except 1 tablet on Mondays, Wednesdays, and Fridays. Recheck INR in 5 weeks.

## 2017-01-28 ENCOUNTER — Other Ambulatory Visit: Payer: Self-pay | Admitting: Interventional Cardiology

## 2017-01-29 ENCOUNTER — Other Ambulatory Visit: Payer: Self-pay | Admitting: *Deleted

## 2017-02-13 ENCOUNTER — Ambulatory Visit: Payer: Medicare Other | Admitting: *Deleted

## 2017-02-13 DIAGNOSIS — Z5181 Encounter for therapeutic drug level monitoring: Secondary | ICD-10-CM

## 2017-02-13 DIAGNOSIS — I482 Chronic atrial fibrillation, unspecified: Secondary | ICD-10-CM

## 2017-02-13 DIAGNOSIS — I481 Persistent atrial fibrillation: Secondary | ICD-10-CM

## 2017-02-13 DIAGNOSIS — I4819 Other persistent atrial fibrillation: Secondary | ICD-10-CM

## 2017-02-13 LAB — POCT INR: INR: 2.1

## 2017-02-13 NOTE — Patient Instructions (Signed)
Description   Continue same dosage 2 tablets daily except 1 tablet on Mondays, Wednesdays, and Fridays. Recheck INR in 6 weeks.       

## 2017-02-25 ENCOUNTER — Other Ambulatory Visit: Payer: Self-pay | Admitting: Interventional Cardiology

## 2017-03-27 ENCOUNTER — Ambulatory Visit: Payer: Medicare Other | Admitting: Pharmacist

## 2017-03-27 DIAGNOSIS — Z5181 Encounter for therapeutic drug level monitoring: Secondary | ICD-10-CM

## 2017-03-27 DIAGNOSIS — I481 Persistent atrial fibrillation: Secondary | ICD-10-CM

## 2017-03-27 DIAGNOSIS — I482 Chronic atrial fibrillation, unspecified: Secondary | ICD-10-CM

## 2017-03-27 DIAGNOSIS — I4819 Other persistent atrial fibrillation: Secondary | ICD-10-CM

## 2017-03-27 LAB — POCT INR: INR: 2.5

## 2017-03-27 NOTE — Patient Instructions (Signed)
Description   Continue same dosage 2 tablets daily except 1 tablet on Mondays, Wednesdays, and Fridays. Recheck INR in 6 weeks.       

## 2017-03-30 ENCOUNTER — Other Ambulatory Visit: Payer: Self-pay | Admitting: Interventional Cardiology

## 2017-04-04 ENCOUNTER — Encounter: Payer: Self-pay | Admitting: Interventional Cardiology

## 2017-04-04 ENCOUNTER — Ambulatory Visit: Payer: Medicare Other | Admitting: Interventional Cardiology

## 2017-04-04 VITALS — BP 126/86 | HR 92 | Ht 71.0 in | Wt 308.6 lb

## 2017-04-04 DIAGNOSIS — I5032 Chronic diastolic (congestive) heart failure: Secondary | ICD-10-CM | POA: Diagnosis not present

## 2017-04-04 DIAGNOSIS — I481 Persistent atrial fibrillation: Secondary | ICD-10-CM | POA: Diagnosis not present

## 2017-04-04 DIAGNOSIS — Z7901 Long term (current) use of anticoagulants: Secondary | ICD-10-CM | POA: Diagnosis not present

## 2017-04-04 DIAGNOSIS — I4819 Other persistent atrial fibrillation: Secondary | ICD-10-CM

## 2017-04-04 DIAGNOSIS — N183 Chronic kidney disease, stage 3 unspecified: Secondary | ICD-10-CM

## 2017-04-04 DIAGNOSIS — I10 Essential (primary) hypertension: Secondary | ICD-10-CM

## 2017-04-04 DIAGNOSIS — E7849 Other hyperlipidemia: Secondary | ICD-10-CM | POA: Diagnosis not present

## 2017-04-04 DIAGNOSIS — I129 Hypertensive chronic kidney disease with stage 1 through stage 4 chronic kidney disease, or unspecified chronic kidney disease: Secondary | ICD-10-CM

## 2017-04-04 MED ORDER — DIGOXIN 125 MCG PO TABS: 125.0000 ug | ORAL_TABLET | ORAL | 3 refills | Status: DC

## 2017-04-04 NOTE — Patient Instructions (Signed)
Medication Instructions:  1) DECREASE Digoxin to one tablet on Monday, Wednesday and Friday  Labwork: None  Testing/Procedures: None  Follow-Up: Your physician wants you to follow-up in: 1 year with Dr. Tamala Riviello.  You will receive a reminder letter in the mail two months in advance. If you don't receive a letter, please call our office to schedule the follow-up appointment.   Any Other Special Instructions Will Be Listed Below (If Applicable).     If you need a refill on your cardiac medications before your next appointment, please call your pharmacy.

## 2017-04-04 NOTE — Progress Notes (Signed)
Cardiology Office Note:    Date:  04/04/2017   ID:  Harry Foster, DOB 21-Jan-1945, MRN [id number]  PCP:  Josetta Huddle, MD  Cardiologist:  No primary care provider on file.   Referring MD: Josetta Huddle, MD   Chief Complaint  Patient presents with  . Atrial Fibrillation  Atrial fibrillation  History of Present Illness:    Harry Foster is a 72 y.o. male with hx of atrial fibrillation, hypertension, chronic diastolic heart failure, and chronic anticoagulation therapy.   Area is doing well.  He feels fatigued with activity more than usual although not dramatically different than 6-12 months ago.  He denies orthopnea and PND.  States the blood pressure and heart rate tend to be better than currently noted.  Appetite is good.  No other complaints.  Past Medical History:  Diagnosis Date  . Arthritis    both knees and hands and left elbow, hip  . Depression    treated for depression when wife was very ill  . Diverticulosis    hx - no sx  . Dysrhythmia    hx A fib, on anticoagulants  . GI bleed 2010  . GI bleed    hx  . Hypertension   . Recurrent upper respiratory infection (URI)    completing treatment for sinus infection - with abx    Past Surgical History:  Procedure Laterality Date  . CARDIOVERSION  02/01/2011   Procedure: CARDIOVERSION;  Surgeon: Sinclair Grooms, MD;  Location: [place];  Service: Cardiovascular;  Laterality: N/A;  . SINUS EXPLORATION  1992   laser sinus surgery  . TONSILLECTOMY     age 51  . TOTAL KNEE ARTHROPLASTY  10/09/2011   Procedure: TOTAL KNEE ARTHROPLASTY;  Surgeon: Hessie Dibble, MD;  Location: [place];  Service: Orthopedics;  Laterality: Left;  left total knee arthroplasty  . TOTAL KNEE ARTHROPLASTY Right 10/06/2013   DR DALLDORF  . TOTAL KNEE ARTHROPLASTY WITH REVISION COMPONENTS Right 10/06/2013   Procedure: TOTAL KNEE ARTHROPLASTY WITH REVISION COMPONENTS;  Surgeon: Hessie Dibble, MD;  Location: [place];  Service: Orthopedics;   Laterality: Right;  . TREATMENT FISTULA ANAL  1992    Current Medications: Current Meds  Medication Sig  . ascorbic acid (VITAMIN C) 1000 MG tablet Take 1,000 mg by mouth daily.    Marland Kitchen aspirin 81 MG EC tablet Take 81 mg by mouth daily. Swallow whole.  . Coenzyme Q10 (CO Q 10) 100 MG CAPS Take 100 mg by mouth daily.  Marland Kitchen DHEA 50 MG TABS Take 50 mg by mouth daily.   Derrill Memo ON 04/05/2017] digoxin (DIGOX) 0.125 MG tablet Take 1 tablet (125 mcg total) by mouth every Monday, Wednesday, and Friday.  . Lactobacillus (ACIDOPHILUS/BIFIDUS PO) Take 100 mg by mouth daily.  . Loperamide HCl (IMODIUM PO) Take 1 tablet by mouth daily as needed (loose stools).   . magnesium gluconate (MAGONATE) 500 MG tablet Take 500 mg by mouth daily.  . metoprolol succinate (TOPROL-XL) 50 MG 24 hr tablet TAKE 2 TABLETS BY MOUTH 2 TIMES DAILY. TAKE WITH OR IMMEDIATELY FOLLOWING A MEAL.  . Multiple Vitamin (MULTIVITAMIN WITH MINERALS) TABS Take 2 tablets by mouth daily.  Marland Kitchen OLIVE LEAF EXTRACT PO Take 1 tablet by mouth daily.    Marland Kitchen olmesartan-hydrochlorothiazide (BENICAR HCT) 40-12.5 MG tablet Take 1 tablet by mouth daily. Please keep upcoming appt for future refills. Thank you  . OVER THE COUNTER MEDICATION Take 1 capsule by mouth daily. Berry extract   .  OVER THE COUNTER MEDICATION Take 1 capsule by mouth daily. Vitamin d 5000 units w/ iodine 1028mcg   . Pomegranate, Punica granatum, (POMEGRANATE PO) Take 250 mg by mouth daily.   . Saw Palmetto, Serenoa repens, (SAW PALMETTO PO) Take 500 mg by mouth daily.   Marland Kitchen triamcinolone (NASACORT) 55 MCG/ACT nasal inhaler Place 1 spray into both nostrils daily as needed (allergies).   Marland Kitchen VITAMIN E PO Take 360 mg by mouth daily.   Marland Kitchen warfarin (COUMADIN) 5 MG tablet TAKE AS DIRECTED BY COUMADIN CLINIC  . [DISCONTINUED] digoxin (DIGOX) 0.125 MG tablet Take 1 tablet (125 mcg total) by mouth daily. Please keep upcoming appointment 4/4 at 2:40 pm for additional refills thanks.     Allergies:    Levaquin [levofloxacin in d5w] and Lisinopril   Social History   Socioeconomic History  . Marital status: Widowed    Spouse name: Not on file  . Number of children: Not on file  . Years of education: Not on file  . Highest education level: Not on file  Occupational History  . Not on file  Social Needs  . Financial resource strain: Not on file  . Food insecurity:    Worry: Not on file    Inability: Not on file  . Transportation needs:    Medical: Not on file    Non-medical: Not on file  Tobacco Use  . Smoking status: Never Smoker  . Smokeless tobacco: Never Used  Substance and Sexual Activity  . Alcohol use: No  . Drug use: No  . Sexual activity: Not on file  Lifestyle  . Physical activity:    Days per week: Not on file    Minutes per session: Not on file  . Stress: Not on file  Relationships  . Social connections:    Talks on phone: Not on file    Gets together: Not on file    Attends religious service: Not on file    Active member of club or organization: Not on file    Attends meetings of clubs or organizations: Not on file    Relationship status: Not on file  Other Topics Concern  . Not on file  Social History Narrative  . Not on file     Family History: The patient's family history includes Heart disease in his father and mother.  ROS:   Please see the history of present illness.    Back and leg discomfort.  Decreased energy with activity.  All other systems reviewed and are negative.  EKGs/Labs/Other Studies Reviewed:    The following studies were reviewed today: None  EKG:  EKG is not ordered today.   Recent Labs: No results found for requested labs within last 8760 hours.  Recent Lipid Panel No results found for: CHOL, TRIG, HDL, CHOLHDL, VLDL, LDLCALC, LDLDIRECT  Physical Exam:    VS:  BP 126/86   Pulse 92   Ht 5\' 11"  (1.803 m)   Wt (!) 308 lb 9.6 oz (140 kg)   SpO2 93%   BMI 43.04 kg/m     Wt Readings from Last 3 Encounters:    04/04/17 (!) 308 lb 9.6 oz (140 kg)  03/07/16 (!) 313 lb 6.4 oz (142.2 kg)  08/16/15 (!) 311 lb 12.8 oz (141.4 kg)     GEN:  Well nourished, well developed in no acute distress HEENT: Normal NECK: No JVD; No carotid bruits LYMPHATICS: No lymphadenopathy CARDIAC: IIRR, no murmurs, rubs, gallops RESPIRATORY:  Clear to auscultation without  rales, wheezing or rhonchi  ABDOMEN: Soft, non-tender, non-distended MUSCULOSKELETAL:  No edema; No deformity  SKIN: Warm and dry NEUROLOGIC:  Alert and oriented x 3 PSYCHIATRIC:  Normal affect   ASSESSMENT:    1. Atrial fibrillation, persistent ([place])   2. Benign hypertension with CKD (chronic kidney disease) stage III ([place])   3. Chronic diastolic heart failure ([place])   4. Anticoagulation adequate   5. Other hyperlipidemia   6. Essential hypertension    PLAN:    In order of problems listed above:  1. Adequate rate control.  Given kidney function, will decrease Lanoxin to the equivalent of 0.0625 mg/day by having him take 0.125 mg on Monday Wednesday and Friday. 2. Blood pressure is adequately controlled.  Target 130/80 mmHg.  Low-salt diet recommended. 3. No evidence of volume overload.  Call if orthopnea or progressive dyspnea.  I do believe that exertional fatigue is related to atrial fibrillation.  Greater than a year and a half ago, we chose rate control over rhythm control.  Not able to change strategies at this time. 4. No bleeding 5. Target LDL should be less than 100 and preferably less than 70 6. Target 130/80 mmHg  Areas doing well.  I have encouraged regular aerobic activity.  Clinical follow-up in 9-12 months.  No change in therapy.  On return dig level should be performed.  Medication Adjustments/Labs and Tests Ordered: Current medicines are reviewed at length with the patient today.  Concerns regarding medicines are outlined above.  Orders Placed This Encounter  Procedures  . EKG 12-Lead   Meds ordered this encounter   Medications  . digoxin (DIGOX) 0.125 MG tablet    Sig: Take 1 tablet (125 mcg total) by mouth every Monday, Wednesday, and Friday.    Dispense:  45 tablet    Refill:  3    Dose change    Signed, Sinclair Grooms, MD  04/04/2017 5:19 PM    [facility]

## 2017-05-08 ENCOUNTER — Ambulatory Visit: Payer: Medicare Other | Admitting: Pharmacist

## 2017-05-08 DIAGNOSIS — I482 Chronic atrial fibrillation, unspecified: Secondary | ICD-10-CM

## 2017-05-08 DIAGNOSIS — Z5181 Encounter for therapeutic drug level monitoring: Secondary | ICD-10-CM | POA: Diagnosis not present

## 2017-05-08 DIAGNOSIS — I481 Persistent atrial fibrillation: Secondary | ICD-10-CM

## 2017-05-08 DIAGNOSIS — I4819 Other persistent atrial fibrillation: Secondary | ICD-10-CM

## 2017-05-08 LAB — POCT INR: INR: 2.3

## 2017-05-08 NOTE — Patient Instructions (Signed)
Description   Continue same dosage 2 tablets daily except 1 tablet on Mondays, Wednesdays, and Fridays. Recheck INR in 6 weeks.       

## 2017-05-30 ENCOUNTER — Other Ambulatory Visit: Payer: Self-pay | Admitting: Interventional Cardiology

## 2017-06-19 ENCOUNTER — Ambulatory Visit: Payer: Medicare Other | Admitting: Pharmacist

## 2017-06-19 DIAGNOSIS — Z5181 Encounter for therapeutic drug level monitoring: Secondary | ICD-10-CM

## 2017-06-19 DIAGNOSIS — I482 Chronic atrial fibrillation, unspecified: Secondary | ICD-10-CM

## 2017-06-19 DIAGNOSIS — I4819 Other persistent atrial fibrillation: Secondary | ICD-10-CM

## 2017-06-19 DIAGNOSIS — I481 Persistent atrial fibrillation: Secondary | ICD-10-CM

## 2017-06-19 LAB — POCT INR: INR: 3.4 — AB (ref 2.0–3.0)

## 2017-06-19 NOTE — Patient Instructions (Signed)
Description   Skip your Coumadin today, then continue same dosage 2 tablets daily except 1 tablet on Mondays, Wednesdays, and Fridays. Recheck INR in 5 weeks.

## 2017-06-28 ENCOUNTER — Other Ambulatory Visit: Payer: Self-pay | Admitting: Interventional Cardiology

## 2017-07-24 ENCOUNTER — Ambulatory Visit: Payer: Medicare Other | Admitting: *Deleted

## 2017-07-24 ENCOUNTER — Encounter (INDEPENDENT_AMBULATORY_CARE_PROVIDER_SITE_OTHER): Payer: Self-pay

## 2017-07-24 DIAGNOSIS — I4819 Other persistent atrial fibrillation: Secondary | ICD-10-CM

## 2017-07-24 DIAGNOSIS — I482 Chronic atrial fibrillation, unspecified: Secondary | ICD-10-CM

## 2017-07-24 DIAGNOSIS — Z5181 Encounter for therapeutic drug level monitoring: Secondary | ICD-10-CM

## 2017-07-24 DIAGNOSIS — I481 Persistent atrial fibrillation: Secondary | ICD-10-CM | POA: Diagnosis not present

## 2017-07-24 LAB — POCT INR: INR: 2.7 (ref 2.0–3.0)

## 2017-07-24 NOTE — Patient Instructions (Signed)
Description   Continue same dosage 2 tablets daily except 1 tablet on Mondays, Wednesdays, and Fridays. Recheck INR in 6 weeks.

## 2017-09-04 ENCOUNTER — Encounter (INDEPENDENT_AMBULATORY_CARE_PROVIDER_SITE_OTHER): Payer: Self-pay

## 2017-09-04 ENCOUNTER — Ambulatory Visit: Payer: Medicare Other | Admitting: Pharmacist

## 2017-09-04 DIAGNOSIS — I482 Chronic atrial fibrillation, unspecified: Secondary | ICD-10-CM

## 2017-09-04 DIAGNOSIS — I4819 Other persistent atrial fibrillation: Secondary | ICD-10-CM

## 2017-09-04 DIAGNOSIS — I481 Persistent atrial fibrillation: Secondary | ICD-10-CM | POA: Diagnosis not present

## 2017-09-04 DIAGNOSIS — Z5181 Encounter for therapeutic drug level monitoring: Secondary | ICD-10-CM

## 2017-09-04 LAB — POCT INR: INR: 3.3 — AB (ref 2.0–3.0)

## 2017-09-04 NOTE — Patient Instructions (Signed)
Description   Skip warfarin today then continue same dosage 2 tablets daily except 1 tablet on Mondays, Wednesdays, and Fridays. Recheck INR in 6 weeks per pt.

## 2017-10-16 ENCOUNTER — Ambulatory Visit: Payer: Medicare Other | Admitting: *Deleted

## 2017-10-16 DIAGNOSIS — I482 Chronic atrial fibrillation, unspecified: Secondary | ICD-10-CM

## 2017-10-16 DIAGNOSIS — Z5181 Encounter for therapeutic drug level monitoring: Secondary | ICD-10-CM | POA: Diagnosis not present

## 2017-10-16 DIAGNOSIS — I4819 Other persistent atrial fibrillation: Secondary | ICD-10-CM | POA: Diagnosis not present

## 2017-10-16 LAB — POCT INR: INR: 1.8 — AB (ref 2.0–3.0)

## 2017-10-16 NOTE — Patient Instructions (Signed)
Description   Today take 1.5 tablets, then Continue same dosage 2 tablets daily except 1 tablet on Mondays, Wednesdays, and Fridays. Recheck INR in 5 weeks

## 2017-11-21 ENCOUNTER — Ambulatory Visit: Payer: Medicare Other

## 2017-11-21 DIAGNOSIS — I482 Chronic atrial fibrillation, unspecified: Secondary | ICD-10-CM

## 2017-11-21 DIAGNOSIS — Z5181 Encounter for therapeutic drug level monitoring: Secondary | ICD-10-CM

## 2017-11-21 DIAGNOSIS — I4819 Other persistent atrial fibrillation: Secondary | ICD-10-CM

## 2017-11-21 LAB — POCT INR: INR: 2.9 (ref 2.0–3.0)

## 2017-11-21 NOTE — Patient Instructions (Signed)
Description   Continue same dosage 2 tablets daily except 1 tablet on Mondays, Wednesdays, and Fridays. Recheck INR in 6 weeks

## 2018-01-02 ENCOUNTER — Ambulatory Visit: Payer: Medicare Other

## 2018-01-02 DIAGNOSIS — I4819 Other persistent atrial fibrillation: Secondary | ICD-10-CM | POA: Diagnosis not present

## 2018-01-02 DIAGNOSIS — Z5181 Encounter for therapeutic drug level monitoring: Secondary | ICD-10-CM

## 2018-01-02 LAB — POCT INR: INR: 3.2 — AB (ref 2.0–3.0)

## 2018-01-02 NOTE — Patient Instructions (Signed)
Description   Skip today's dosage of Coumadin, then resume same dosage 2 tablets daily except 1 tablet on Mondays, Wednesdays, and Fridays. Recheck INR in 4 weeks

## 2018-01-22 ENCOUNTER — Other Ambulatory Visit: Payer: Self-pay | Admitting: Interventional Cardiology

## 2018-01-30 ENCOUNTER — Ambulatory Visit: Payer: Medicare Other | Admitting: *Deleted

## 2018-01-30 DIAGNOSIS — Z5181 Encounter for therapeutic drug level monitoring: Secondary | ICD-10-CM | POA: Diagnosis not present

## 2018-01-30 DIAGNOSIS — I4819 Other persistent atrial fibrillation: Secondary | ICD-10-CM

## 2018-01-30 LAB — POCT INR: INR: 2.3 (ref 2.0–3.0)

## 2018-01-30 NOTE — Patient Instructions (Signed)
Description   Continue taking 2 tablets daily except 1 tablet on Mondays, Wednesdays, and Fridays. Recheck INR in 5 weeks.

## 2018-03-06 ENCOUNTER — Ambulatory Visit: Payer: Medicare Other | Admitting: Pharmacist

## 2018-03-06 DIAGNOSIS — Z5181 Encounter for therapeutic drug level monitoring: Secondary | ICD-10-CM

## 2018-03-06 DIAGNOSIS — I4819 Other persistent atrial fibrillation: Secondary | ICD-10-CM

## 2018-03-06 LAB — POCT INR: INR: 2.4 (ref 2.0–3.0)

## 2018-03-06 NOTE — Patient Instructions (Signed)
Description   Continue taking 2 tablets daily except 1 tablet on Mondays, Wednesdays, and Fridays. Recheck INR in 6 weeks.

## 2018-04-18 ENCOUNTER — Other Ambulatory Visit: Payer: Self-pay | Admitting: Interventional Cardiology

## 2018-04-21 ENCOUNTER — Telehealth: Payer: Self-pay

## 2018-04-21 NOTE — Telephone Encounter (Signed)

## 2018-04-22 ENCOUNTER — Other Ambulatory Visit: Payer: Self-pay | Admitting: Pharmacist Clinician (PhC)/ Clinical Pharmacy Specialist

## 2018-04-22 ENCOUNTER — Other Ambulatory Visit: Payer: Self-pay

## 2018-04-22 ENCOUNTER — Ambulatory Visit: Payer: Medicare Other | Admitting: Interventional Cardiology

## 2018-04-22 ENCOUNTER — Ambulatory Visit (INDEPENDENT_AMBULATORY_CARE_PROVIDER_SITE_OTHER): Payer: Medicare Other | Admitting: Pharmacist Clinician (PhC)/ Clinical Pharmacy Specialist

## 2018-04-22 DIAGNOSIS — Z5181 Encounter for therapeutic drug level monitoring: Secondary | ICD-10-CM

## 2018-04-22 DIAGNOSIS — I4819 Other persistent atrial fibrillation: Secondary | ICD-10-CM | POA: Diagnosis not present

## 2018-04-22 LAB — POCT INR: INR: 3.3 — AB (ref 2.0–3.0)

## 2018-05-05 ENCOUNTER — Telehealth: Payer: Self-pay | Admitting: Interventional Cardiology

## 2018-05-05 DIAGNOSIS — M7989 Other specified soft tissue disorders: Secondary | ICD-10-CM

## 2018-05-05 DIAGNOSIS — M79604 Pain in right leg: Secondary | ICD-10-CM

## 2018-05-05 DIAGNOSIS — R238 Other skin changes: Principal | ICD-10-CM

## 2018-05-05 DIAGNOSIS — M79605 Pain in left leg: Secondary | ICD-10-CM

## 2018-05-05 MED ORDER — FUROSEMIDE 40 MG PO TABS: 40.0000 mg | ORAL_TABLET | Freq: Every day | ORAL | 0 refills | Status: DC

## 2018-05-05 NOTE — Telephone Encounter (Signed)
New Message   Pt c/o swelling: STAT is pt has developed SOB within 24 hours  1) How much weight have you gained and in what time span? None  2) If swelling, where is the swelling located? Right Leg  3) Are you currently taking a fluid pill? Yes  4) Are you currently SOB? No  5) Do you have a log of your daily weights (if so, list)? N/A  6) Have you gained 3 pounds in a day or 5 pounds in a week? NO  7) Have you traveled recently? NO

## 2018-05-05 NOTE — Telephone Encounter (Signed)
Needs bilateral LE Venous doppler. Take furosemide 40 mg daily x 2 doses.

## 2018-05-05 NOTE — Telephone Encounter (Signed)
Pt states for about a week now he has noticed swelling in both feet and ankles.  Left is only slight.  Right is much worse and also has red and brown color to skin and very painful.  Pt mentioned that he did hit this leg with the car door about a week ago and developed a "goose egg and bruising" that lasted about 3-4 days.  Pain and swelling is below the area that he hit with door.  Wears compression stockings but they don't really help with the swelling but improve the pain so that he can get up and get around to do things.  Denies missed doses of Warfarin (takes 5mg  MWF, 10mg  on the other days).  Last INR was 3.3 on 4/21. Currently takes Benicar/HCTZ 40/12.5mg .  BP and HR have been normal.  BP typically below 120/80 and HR always in upper 70s, lower 80s. Advised I will send message to Dr. Tamala Nappi to review.

## 2018-05-05 NOTE — Telephone Encounter (Signed)
Spoke with pt and made him aware of recommendations.  Pt verbalized understanding and was in agreement with plan.

## 2018-05-06 ENCOUNTER — Ambulatory Visit (HOSPITAL_COMMUNITY)
Admission: RE | Admit: 2018-05-06 | Discharge: 2018-05-06 | Disposition: A | Discharge status: Home / Self Care | Payer: Medicare Other | Source: Ambulatory Visit | Attending: Interventional Cardiology | Admitting: Interventional Cardiology

## 2018-05-06 ENCOUNTER — Other Ambulatory Visit: Payer: Self-pay

## 2018-05-06 DIAGNOSIS — M7989 Other specified soft tissue disorders: Secondary | ICD-10-CM | POA: Insufficient documentation

## 2018-05-06 DIAGNOSIS — M79604 Pain in right leg: Secondary | ICD-10-CM | POA: Diagnosis not present

## 2018-05-06 DIAGNOSIS — R238 Other skin changes: Secondary | ICD-10-CM | POA: Diagnosis present

## 2018-05-07 NOTE — Progress Notes (Signed)
Cardiology Office Note:    Date:  05/08/2018   ID:  Harry Foster, DOB [birth year], MRN [id number]  PCP:  Josetta Huddle, MD  Cardiologist:  Sinclair Grooms, MD   Referring MD: Josetta Huddle, MD   Chief Complaint  Patient presents with   Atrial Fibrillation   Congestive Heart Failure    History of Present Illness:    Harry Foster is a 73 y.o. male with a hx of atrial fibrillation, hypertension, chronic diastolic heart failure, and chronic anticoagulation therapy. Recent trauma to knee then noted progressive swelling.  Harry Foster has chronic bilateral lower extremity edema.  About 2 weeks ago he banged his right lateral calf with a car door.  He is on Coumadin anticoagulation therapy and immediately noticed that there was swelling and the formation of a lump in the calf.  Since that time he has had significant pain and progressive swelling in the right lower extremity greater than the left.  Earlier this week he had a DVT study done and no clot was identified.  He does have a seroma in the lateral aspect of the right calf slightly below the knee.  There is no evidence of a Baker's cyst.  Because of significant bilateral lower extremity edema 48 hours ago I recommended furosemide 40 mg for 2 days each.  This was on top of his typical medication therapy which includes 12-1/2 mg of furosemide.  After the furosemide he has noted dramatic decrease in swelling in the left leg and some improvement in the right leg.  The leg discomfort that was present at that time has improved.  Past Medical History:  Diagnosis Date   Arthritis    both knees and hands and left elbow, hip   Depression    treated for depression when wife was very ill   Diverticulosis    hx - no sx   Dysrhythmia    hx A fib, on anticoagulants   GI bleed 2010   GI bleed    hx   Hypertension    Recurrent upper respiratory infection (URI)    completing treatment for sinus infection - with abx    Past Surgical  History:  Procedure Laterality Date   CARDIOVERSION  02/01/2011   Procedure: CARDIOVERSION;  Surgeon: Sinclair Grooms, MD;  Location: [place];  Service: Cardiovascular;  Laterality: N/A;   SINUS EXPLORATION  1992   laser sinus surgery   TONSILLECTOMY     age 25   TOTAL KNEE ARTHROPLASTY  10/09/2011   Procedure: TOTAL KNEE ARTHROPLASTY;  Surgeon: Hessie Dibble, MD;  Location: [place];  Service: Orthopedics;  Laterality: Left;  left total knee arthroplasty   TOTAL KNEE ARTHROPLASTY Right 10/06/2013   DR DALLDORF   TOTAL KNEE ARTHROPLASTY WITH REVISION COMPONENTS Right 10/06/2013   Procedure: TOTAL KNEE ARTHROPLASTY WITH REVISION COMPONENTS;  Surgeon: Hessie Dibble, MD;  Location: [place];  Service: Orthopedics;  Laterality: Right;   TREATMENT FISTULA ANAL  1992    Current Medications: Current Meds  Medication Sig   ascorbic acid (VITAMIN C) 1000 MG tablet Take 1,000 mg by mouth daily.     aspirin 81 MG EC tablet Take 81 mg by mouth daily. Swallow whole.   Coenzyme Q10 (CO Q 10) 100 MG CAPS Take 100 mg by mouth daily.   DHEA 50 MG TABS Take 50 mg by mouth daily.    [place] 125 MCG tablet TAKE 1 TABLET BY MOUTH EVERY MONDAY, WEDNESDAY, AND FRIDAY.  Lactobacillus (ACIDOPHILUS/BIFIDUS PO) Take 100 mg by mouth daily.   Loperamide HCl (IMODIUM PO) Take 1 tablet by mouth daily as needed (loose stools).    magnesium gluconate (MAGONATE) 500 MG tablet Take 500 mg by mouth daily.   metoprolol succinate (TOPROL-XL) 50 MG 24 hr tablet TAKE 2 TABLETS BY MOUTH 2 TIMES DAILY. TAKE WITH OR IMMEDIATELY FOLLOWING A MEAL.   Multiple Vitamin (MULTIVITAMIN WITH MINERALS) TABS Take 2 tablets by mouth daily.   OLIVE LEAF EXTRACT PO Take 1 tablet by mouth daily.     OVER THE COUNTER MEDICATION Take 1 capsule by mouth daily. Berry extract    OVER THE COUNTER MEDICATION Take 1 capsule by mouth daily. Vitamin d 5000 units w/ iodine 1080mcg    Pomegranate, Punica granatum, (POMEGRANATE PO) Take  250 mg by mouth daily.    Saw Palmetto, Serenoa repens, (SAW PALMETTO PO) Take 500 mg by mouth daily.    triamcinolone (NASACORT) 55 MCG/ACT nasal inhaler Place 1 spray into both nostrils daily as needed (allergies).    VITAMIN E PO Take 360 mg by mouth daily.    warfarin (COUMADIN) 5 MG tablet TAKE AS DIRECTED BY COUMADIN CLINIC   [DISCONTINUED] olmesartan-hydrochlorothiazide (BENICAR HCT) 40-12.5 MG tablet Take 1 tablet by mouth daily.     Allergies:   Levaquin [levofloxacin in d5w] and Lisinopril   Social History   Socioeconomic History   Marital status: Widowed    Spouse name: Not on file   Number of children: Not on file   Years of education: Not on file   Highest education level: Not on file  Occupational History   Not on file  Social Needs   Financial resource strain: Not on file   Food insecurity:    Worry: Not on file    Inability: Not on file   Transportation needs:    Medical: Not on file    Non-medical: Not on file  Tobacco Use   Smoking status: Never Smoker   Smokeless tobacco: Never Used  Substance and Sexual Activity   Alcohol use: No   Drug use: No   Sexual activity: Not on file  Lifestyle   Physical activity:    Days per week: Not on file    Minutes per session: Not on file   Stress: Not on file  Relationships   Social connections:    Talks on phone: Not on file    Gets together: Not on file    Attends religious service: Not on file    Active member of club or organization: Not on file    Attends meetings of clubs or organizations: Not on file    Relationship status: Not on file  Other Topics Concern   Not on file  Social History Narrative   Not on file     Family History: The patient's family history includes Heart disease in his father and mother.  ROS:   Please see the history of present illness.    Pain in the calf, does not have shoes that will fit his right foot.  After furosemide he has had an improvement in the  size of the right leg.  All other systems reviewed and are negative.  EKGs/Labs/Other Studies Reviewed:    The following studies were reviewed today: VASCULAR U?S 05/06/2018: Summary:   Right: No evidence of deep vein thrombosis in the lower extremity. No indirect evidence of obstruction proximal to the inguinal ligament. No cystic structure found in the popliteal fossa. Large probable  seroma seen at area of trauma. Appx. measurements  6.0 cm x 5.8 cm x 1.6 cm. No flow detected within structure.  Left: No evidence of deep vein thrombosis in the lower extremity. No indirect evidence of obstruction proximal to the inguinal ligament. No cystic structure found in the popliteal fossa.     EKG:  EKG atrial fibrillation with controlled rate at 85 bpm.  Borderline low voltage is noted.  Recent Labs: No results found for requested labs within last 8760 hours.  Recent Lipid Panel No results found for: CHOL, TRIG, HDL, CHOLHDL, VLDL, LDLCALC, LDLDIRECT  Physical Exam:    VS:  BP 108/74    Pulse 85    Ht 5\' 11"  (1.803 m)    Wt (!) 304 lb 9.6 oz (138.2 kg)    SpO2 96%    BMI 42.48 kg/m     Wt Readings from Last 3 Encounters:  05/08/18 (!) 304 lb 9.6 oz (138.2 kg)  04/04/17 (!) 308 lb 9.6 oz (140 kg)  03/07/16 (!) 313 lb 6.4 oz (142.2 kg)     GEN: Obese. No acute distress HEENT: Normal NECK: No JVD. LYMPHATICS: No lymphadenopathy CARDIAC: IIRR.  No systolic murmur, no gallop, 3-4+ right lower extremity edema.  1-2+ left lower extremity edema. VASCULAR: 2+ and symmetric bilateral radial pulses, no bruits RESPIRATORY:  Clear to auscultation without rales, wheezing or rhonchi  ABDOMEN: Soft, non-tender, non-distended, No pulsatile mass, MUSCULOSKELETAL: Right upper lateral calf contains a fluctuant hygroma/seroma that is mildly tender.  Both legs are slightly erythematous.  Chronic stasis changes are noted in both lower extremities. SKIN: Warm and dry NEUROLOGIC:  Alert and oriented x  3 PSYCHIATRIC:  Normal affect   ASSESSMENT:    1. Right leg pain   2. Atrial fibrillation, persistent   3. Chronic diastolic heart failure ([place])   4. Chronic kidney disease (CKD), stage III (moderate) ([facility])   5. Long-term use of high-risk medication   6. Anticoagulation adequate   7. Other hyperlipidemia   8. Essential hypertension   9. 2019 novel coronavirus disease (COVID-19)    PLAN:    In order of problems listed above:  1. Right leg pain and swelling related to right calf hemorrhage following trauma 2 weeks ago.  Vascular ultrasound study demonstrates a seroma in the right calf.  There is no evidence of DVT.  After a couple doses of 40 mg of furosemide, significant bilateral lower extremity edema has improved.  If there is not continued gradual resolution of the seroma, will refer to his orthopedist, Dr. Latanya Maudlin 2. Atrial fibrillation with controlled rate.  A digoxin level will be obtained. 3. Diastolic heart failure with bilateral lower extremity edema: Evidence of volume overload..  Furosemide 40 mg/day will replace HCTZ 12.5 mg/day.  Basic metabolic panel will be performed in 7 to 10 days. 4. Basic metabolic panel in 7 days to reassess kidney function and potassium 5. Continue Coumadin therapy. 6. Continue Coumadin therapy and ambulation to prevent DVT. 7. Lipids were not addressed 8. Monitor blood pressure after initiation of furosemide to exclude the possibility of dehydration.    Medication Adjustments/Labs and Tests Ordered: Current medicines are reviewed at length with the patient today.  Concerns regarding medicines are outlined above.  Orders Placed This Encounter  Procedures   Basic metabolic panel   Digoxin level   EKG 12-Lead   Meds ordered this encounter  Medications   furosemide (LASIX) 40 MG tablet    Sig: Take 1 tablet (40  mg total) by mouth daily.    Dispense:  90 tablet    Refill:  3   olmesartan (BENICAR) 40 MG tablet    Sig: Take 1 tablet  (40 mg total) by mouth daily.    Dispense:  90 tablet    Refill:  3    D/c olmesartan/hctz    Patient Instructions  Medication Instructions:  1) DISCONTINUE Olmesartan/HCTZ 2) START Olmesartan 40mg  once daily 3) START Furosemide 40mg  once daily  If you need a refill on your cardiac medications before your next appointment, please call your pharmacy.   Lab work: Your physician recommends that you return for lab work in: 7-10 days (BMET, Dig level)  If you have labs (blood work) drawn today and your tests are completely normal, you will receive your results only by:  [place] (if you have MyChart) OR  A paper copy in the mail If you have any lab test that is abnormal or we need to change your treatment, we will call you to review the results.  Testing/Procedures: None  Follow-Up: At [facility], you and your health needs are our priority.  As part of our continuing mission to provide you with exceptional heart care, we have created designated Provider Care Teams.  These Care Teams include your primary Cardiologist (physician) and Advanced Practice Providers (APPs -  Physician Assistants and Nurse Practitioners) who all work together to provide you with the care you need, when you need it. You will need a follow up appointment in 5-6 months.  Please call our office 2 months in advance to schedule this appointment.  You may see Sinclair Grooms, MD or one of the following Advanced Practice Providers on your designated Care Team:   Truitt Merle, NP Cecilie Kicks, NP  Kathyrn Drown, NP  Any Other Special Instructions Will Be Listed Below (If Applicable).       Signed, Sinclair Grooms, MD  05/08/2018 5:13 PM    [place]

## 2018-05-08 ENCOUNTER — Other Ambulatory Visit: Payer: Self-pay

## 2018-05-08 ENCOUNTER — Ambulatory Visit (INDEPENDENT_AMBULATORY_CARE_PROVIDER_SITE_OTHER): Payer: Medicare Other | Admitting: Interventional Cardiology

## 2018-05-08 ENCOUNTER — Encounter: Payer: Self-pay | Admitting: Interventional Cardiology

## 2018-05-08 VITALS — BP 108/74 | HR 85 | Ht 71.0 in | Wt 304.6 lb

## 2018-05-08 DIAGNOSIS — I4819 Other persistent atrial fibrillation: Secondary | ICD-10-CM | POA: Diagnosis not present

## 2018-05-08 DIAGNOSIS — Z7189 Other specified counseling: Secondary | ICD-10-CM

## 2018-05-08 DIAGNOSIS — I5032 Chronic diastolic (congestive) heart failure: Secondary | ICD-10-CM | POA: Diagnosis not present

## 2018-05-08 DIAGNOSIS — U071 COVID-19: Secondary | ICD-10-CM

## 2018-05-08 DIAGNOSIS — I10 Essential (primary) hypertension: Secondary | ICD-10-CM

## 2018-05-08 DIAGNOSIS — M79604 Pain in right leg: Secondary | ICD-10-CM | POA: Diagnosis not present

## 2018-05-08 DIAGNOSIS — E7849 Other hyperlipidemia: Secondary | ICD-10-CM

## 2018-05-08 DIAGNOSIS — Z7901 Long term (current) use of anticoagulants: Secondary | ICD-10-CM

## 2018-05-08 DIAGNOSIS — N183 Chronic kidney disease, stage 3 unspecified: Secondary | ICD-10-CM

## 2018-05-08 DIAGNOSIS — Z79899 Other long term (current) drug therapy: Secondary | ICD-10-CM

## 2018-05-08 MED ORDER — FUROSEMIDE 40 MG PO TABS: 40.0000 mg | ORAL_TABLET | Freq: Every day | ORAL | 3 refills | Status: DC

## 2018-05-08 MED ORDER — OLMESARTAN MEDOXOMIL 40 MG PO TABS: 40.0000 mg | ORAL_TABLET | Freq: Every day | ORAL | 3 refills | Status: DC

## 2018-05-08 NOTE — Patient Instructions (Signed)
Medication Instructions:  1) DISCONTINUE Olmesartan/HCTZ 2) START Olmesartan 40mg  once daily 3) START Furosemide 40mg  once daily  If you need a refill on your cardiac medications before your next appointment, please call your pharmacy.   Lab work: Your physician recommends that you return for lab work in: 7-10 days (BMET, Dig level)  If you have labs (blood work) drawn today and your tests are completely normal, you will receive your results only by: Marland Kitchen MyChart Message (if you have MyChart) OR . A paper copy in the mail If you have any lab test that is abnormal or we need to change your treatment, we will call you to review the results.  Testing/Procedures: None  Follow-Up: At [facility], you and your health needs are our priority.  As part of our continuing mission to provide you with exceptional heart care, we have created designated Provider Care Teams.  These Care Teams include your primary Cardiologist (physician) and Advanced Practice Providers (APPs -  Physician Assistants and Nurse Practitioners) who all work together to provide you with the care you need, when you need it. You will need a follow up appointment in 5-6 months.  Please call our office 2 months in advance to schedule this appointment.  You may see Sinclair Grooms, MD or one of the following Advanced Practice Providers on your designated Care Team:   Truitt Merle, NP Cecilie Kicks, NP . Kathyrn Drown, NP  Any Other Special Instructions Will Be Listed Below (If Applicable).

## 2018-05-16 ENCOUNTER — Other Ambulatory Visit: Payer: Medicare Other | Admitting: *Deleted

## 2018-05-16 ENCOUNTER — Other Ambulatory Visit: Payer: Self-pay

## 2018-05-16 ENCOUNTER — Other Ambulatory Visit: Payer: Self-pay | Admitting: Interventional Cardiology

## 2018-05-16 DIAGNOSIS — Z79899 Other long term (current) drug therapy: Secondary | ICD-10-CM

## 2018-05-16 DIAGNOSIS — I5032 Chronic diastolic (congestive) heart failure: Secondary | ICD-10-CM

## 2018-05-17 LAB — BASIC METABOLIC PANEL
BUN/Creatinine Ratio: 19 (ref 10–24)
BUN: 38 mg/dL — ABNORMAL HIGH (ref 8–27)
CO2: 23 mmol/L (ref 20–29)
Calcium: 9.8 mg/dL (ref 8.6–10.2)
Chloride: 106 mmol/L (ref 96–106)
Creatinine, Ser: 1.95 mg/dL — ABNORMAL HIGH (ref 0.76–1.27)
GFR calc Af Amer: 39 mL/min/{1.73_m2} — ABNORMAL LOW (ref >59)
GFR calc non Af Amer: 33 mL/min/{1.73_m2} — ABNORMAL LOW (ref >59)
Glucose: 102 mg/dL — ABNORMAL HIGH (ref 65–99)
Potassium: 4.3 mmol/L (ref 3.5–5.2)
Sodium: 143 mmol/L (ref 134–144)

## 2018-05-17 LAB — DIGOXIN LEVEL: Digoxin, Serum: <0.4 ng/mL — ABNORMAL LOW (ref 0.5–0.9)

## 2018-05-22 ENCOUNTER — Other Ambulatory Visit: Payer: Medicare Other

## 2018-05-30 ENCOUNTER — Telehealth: Payer: Self-pay | Admitting: Pharmacist

## 2018-05-30 NOTE — Telephone Encounter (Signed)
1. COVID-19 Pre-Screening Questions:  . In the past 7 to 10 days have you had a cough,  shortness of breath, headache, congestion, fever (100 or greater) body aches, chills, sore throat, or sudden loss of taste or sense of smell? no . Have you been around anyone with known Covid 19. no . Have you been around anyone who is awaiting Covid 19 test results in the past 7 to 10 days? no . Have you been around anyone who has been exposed to Covid 19, or has mentioned symptoms of Covid 19 within the past 7 to 10 days? no   2. Pt advised of visitor restrictions (no visitors allowed except if needed to conduct the visit). Also advised to arrive at appointment time and wear a mask.   3. Patient is aware that upcoming appointment has been changed to be in office

## 2018-06-03 ENCOUNTER — Ambulatory Visit: Payer: Medicare Other | Admitting: *Deleted

## 2018-06-03 ENCOUNTER — Other Ambulatory Visit: Payer: Self-pay

## 2018-06-03 DIAGNOSIS — I4819 Other persistent atrial fibrillation: Secondary | ICD-10-CM | POA: Diagnosis not present

## 2018-06-03 DIAGNOSIS — Z5181 Encounter for therapeutic drug level monitoring: Secondary | ICD-10-CM

## 2018-06-03 LAB — POCT INR: INR: 3.4 — AB (ref 2.0–3.0)

## 2018-06-03 NOTE — Patient Instructions (Signed)
Description   Hold tonight's of coumadin and then decrease dose to 1 pill daily except for 2 pills on  Monday, Wednesday, and Friday. Call coumadin clinic for any questions [phone].

## 2018-06-18 ENCOUNTER — Telehealth: Payer: Self-pay

## 2018-06-18 NOTE — Telephone Encounter (Signed)

## 2018-06-25 ENCOUNTER — Ambulatory Visit (INDEPENDENT_AMBULATORY_CARE_PROVIDER_SITE_OTHER): Payer: Medicare Other | Admitting: *Deleted

## 2018-06-25 ENCOUNTER — Other Ambulatory Visit: Payer: Self-pay

## 2018-06-25 DIAGNOSIS — I4819 Other persistent atrial fibrillation: Secondary | ICD-10-CM | POA: Diagnosis not present

## 2018-06-25 DIAGNOSIS — Z5181 Encounter for therapeutic drug level monitoring: Secondary | ICD-10-CM

## 2018-06-25 LAB — POCT INR: INR: 2.2 (ref 2.0–3.0)

## 2018-06-25 NOTE — Patient Instructions (Signed)
Description   Continue taking 1 pill daily except for 2 pills on  Monday, Wednesday, and Friday. Recheck INR in 4 weeks. Call coumadin clinic for any questions [phone].

## 2018-07-21 ENCOUNTER — Telehealth: Payer: Self-pay

## 2018-07-21 NOTE — Telephone Encounter (Signed)

## 2018-07-23 ENCOUNTER — Other Ambulatory Visit: Payer: Self-pay

## 2018-07-23 ENCOUNTER — Ambulatory Visit (INDEPENDENT_AMBULATORY_CARE_PROVIDER_SITE_OTHER): Payer: Medicare Other

## 2018-07-23 DIAGNOSIS — I4819 Other persistent atrial fibrillation: Secondary | ICD-10-CM | POA: Diagnosis not present

## 2018-07-23 DIAGNOSIS — Z5181 Encounter for therapeutic drug level monitoring: Secondary | ICD-10-CM | POA: Diagnosis not present

## 2018-07-23 LAB — POCT INR: INR: 2.8 (ref 2.0–3.0)

## 2018-07-23 NOTE — Patient Instructions (Signed)
Description   Continue on same dosage 1 pill daily except for 2 pills on Mondays, Wednesdays, and Fridays. Recheck INR in 5 weeks. Call coumadin clinic for any questions [phone].

## 2018-08-19 ENCOUNTER — Ambulatory Visit: Payer: Medicare Other | Admitting: Interventional Cardiology

## 2018-08-27 ENCOUNTER — Ambulatory Visit (INDEPENDENT_AMBULATORY_CARE_PROVIDER_SITE_OTHER): Payer: Medicare Other | Admitting: *Deleted

## 2018-08-27 ENCOUNTER — Other Ambulatory Visit: Payer: Self-pay

## 2018-08-27 DIAGNOSIS — I4819 Other persistent atrial fibrillation: Secondary | ICD-10-CM

## 2018-08-27 DIAGNOSIS — Z5181 Encounter for therapeutic drug level monitoring: Secondary | ICD-10-CM | POA: Diagnosis not present

## 2018-08-27 LAB — POCT INR: INR: 3 (ref 2.0–3.0)

## 2018-08-27 NOTE — Patient Instructions (Signed)
Description   Continue on same dosage 1 pill daily except for 2 pills on Mondays, Wednesdays, and Fridays. Recheck INR in 6 weeks. Call coumadin clinic for any questions [phone].

## 2018-10-08 ENCOUNTER — Other Ambulatory Visit: Payer: Self-pay

## 2018-10-08 ENCOUNTER — Ambulatory Visit (INDEPENDENT_AMBULATORY_CARE_PROVIDER_SITE_OTHER): Payer: Medicare Other | Admitting: *Deleted

## 2018-10-08 DIAGNOSIS — I4819 Other persistent atrial fibrillation: Secondary | ICD-10-CM | POA: Diagnosis not present

## 2018-10-08 DIAGNOSIS — Z5181 Encounter for therapeutic drug level monitoring: Secondary | ICD-10-CM

## 2018-10-08 LAB — POCT INR: INR: 2.8 (ref 2.0–3.0)

## 2018-10-08 NOTE — Patient Instructions (Signed)
Description   Continue on same dosage 1 pill daily except for 2 pills on Mondays, Wednesdays, and Fridays. Recheck INR in 6 weeks. Call coumadin clinic for any questions [phone].

## 2018-11-11 NOTE — Progress Notes (Signed)
Cardiology Office Note:    Date:  11/12/2018   ID:  Harry Foster, DOB [birth year], MRN [id number]  PCP:  Harry Huddle, MD  Cardiologist:  Harry Grooms, MD   Referring MD: Harry Huddle, MD   Chief Complaint  Patient presents with  . Congestive Heart Failure  . Atrial Fibrillation    History of Present Illness:    Harry Foster is a 73 y.o. male with a hx of atrial fibrillation, hypertension, chronic diastolic heart failure, and chronic anticoagulation therapy.  Earlier this year, Mr. Harry Foster developed progressive lower extremity swelling for which we intensified his diuretic regimen.  This led to dramatic improvement in lower extremity swelling.  The lower extremity swelling was not associated with shortness of breath.  He currently feels that he is doing well.  No chest pain, syncope, or medication side effects.  Past Medical History:  Diagnosis Date  . Arthritis    both knees and hands and left elbow, hip  . Depression    treated for depression when wife was very ill  . Diverticulosis    hx - no sx  . Dysrhythmia    hx A fib, on anticoagulants  . GI bleed 2010  . GI bleed    hx  . Hypertension   . Recurrent upper respiratory infection (URI)    completing treatment for sinus infection - with abx    Past Surgical History:  Procedure Laterality Date  . CARDIOVERSION  02/01/2011   Procedure: CARDIOVERSION;  Surgeon: Harry Grooms, MD;  Location: [place];  Service: Cardiovascular;  Laterality: N/A;  . SINUS EXPLORATION  1992   laser sinus surgery  . TONSILLECTOMY     age 20  . TOTAL KNEE ARTHROPLASTY  10/09/2011   Procedure: TOTAL KNEE ARTHROPLASTY;  Surgeon: Harry Dibble, MD;  Location: [place];  Service: Orthopedics;  Laterality: Left;  left total knee arthroplasty  . TOTAL KNEE ARTHROPLASTY Right 10/06/2013   DR Harry Foster  . TOTAL KNEE ARTHROPLASTY WITH REVISION COMPONENTS Right 10/06/2013   Procedure: TOTAL KNEE ARTHROPLASTY WITH REVISION COMPONENTS;   Surgeon: Harry Dibble, MD;  Location: [place];  Service: Orthopedics;  Laterality: Right;  . TREATMENT FISTULA ANAL  1992    Current Medications: Current Meds  Medication Sig  . ascorbic acid (VITAMIN C) 1000 MG tablet Take 1,000 mg by mouth daily.    Marland Kitchen aspirin 81 MG EC tablet Take 81 mg by mouth daily. Swallow whole.  . Coenzyme Q10 (CO Q 10) 100 MG CAPS Take 100 mg by mouth daily.  Marland Kitchen DHEA 50 MG TABS Take 50 mg by mouth daily.   Marland Kitchen Harry Foster 125 MCG tablet TAKE 1 TABLET BY MOUTH EVERY MONDAY, WEDNESDAY, AND FRIDAY.  . furosemide (LASIX) 40 MG tablet Take 1 tablet (40 mg total) by mouth daily.  . Lactobacillus (ACIDOPHILUS/BIFIDUS PO) Take 100 mg by mouth daily.  . Loperamide HCl (IMODIUM PO) Take 1 tablet by mouth daily as needed (loose stools).   . magnesium gluconate (MAGONATE) 500 MG tablet Take 500 mg by mouth daily.  . metoprolol succinate (TOPROL-XL) 50 MG 24 hr tablet TAKE 2 TABLETS BY MOUTH 2 TIMES DAILY. TAKE WITH OR IMMEDIATELY FOLLOWING A MEAL.  . Multiple Vitamin (MULTIVITAMIN WITH MINERALS) TABS Take 2 tablets by mouth daily.  Marland Kitchen OLIVE LEAF EXTRACT PO Take 1 tablet by mouth daily.    Marland Kitchen olmesartan (BENICAR) 40 MG tablet Take 1 tablet (40 mg total) by mouth daily.  Marland Kitchen  OVER THE COUNTER MEDICATION Take 1 capsule by mouth daily. Berry extract   . OVER THE COUNTER MEDICATION Take 1 capsule by mouth daily. Vitamin d 5000 units w/ iodine 1040mcg   . Pomegranate, Punica granatum, (POMEGRANATE PO) Take 250 mg by mouth daily.   . Saw Palmetto, Serenoa repens, (SAW PALMETTO PO) Take 500 mg by mouth daily.   Marland Kitchen triamcinolone (NASACORT) 55 MCG/ACT nasal inhaler Place 1 spray into both nostrils daily as needed (allergies).   Marland Kitchen VITAMIN E PO Take 360 mg by mouth daily.   Marland Kitchen warfarin (COUMADIN) 5 MG tablet TAKE AS DIRECTED BY COUMADIN CLINIC     Allergies:   Levaquin [levofloxacin in d5w] and Lisinopril   Social History   Socioeconomic History  . Marital status: Widowed    Spouse name: Not on  file  . Number of children: Not on file  . Years of education: Not on file  . Highest education level: Not on file  Occupational History  . Not on file  Social Needs  . Financial resource strain: Not on file  . Food insecurity    Worry: Not on file    Inability: Not on file  . Transportation needs    Medical: Not on file    Non-medical: Not on file  Tobacco Use  . Smoking status: Never Smoker  . Smokeless tobacco: Never Used  Substance and Sexual Activity  . Alcohol use: No  . Drug use: No  . Sexual activity: Not on file  Lifestyle  . Physical activity    Days per week: Not on file    Minutes per session: Not on file  . Stress: Not on file  Relationships  . Social Herbalist on phone: Not on file    Gets together: Not on file    Attends religious service: Not on file    Active member of club or organization: Not on file    Attends meetings of clubs or organizations: Not on file    Relationship status: Not on file  Other Topics Concern  . Not on file  Social History Narrative  . Not on file     Family History: The patient's family history includes Heart disease in his father and mother.  ROS:   Please see the history of present illness.    He has been exposed to Covid approximately 3 weeks ago.  Was never tested.  He quarantined.  He has mast today.  He has never had symptoms.  All other systems reviewed and are negative.  EKGs/Labs/Other Studies Reviewed:    The following studies were reviewed today: No recent cardiovascular data.  EKG:  EKG an EKG is not performed today  Recent Labs: 05/16/2018: BUN 38; Creatinine, Ser 1.95; Potassium 4.3; Sodium 143  Recent Lipid Panel No results found for: CHOL, TRIG, HDL, CHOLHDL, VLDL, LDLCALC, LDLDIRECT  Physical Exam:    VS:  BP 118/78   Pulse 79   Ht 5\' 11"  (1.803 m)   Wt (!) 309 lb 6.4 oz (140.3 kg)   SpO2 96%   BMI 43.15 kg/m     Wt Readings from Last 3 Encounters:  11/12/18 (!) 309 lb 6.4 oz  (140.3 kg)  05/08/18 (!) 304 lb 9.6 oz (138.2 kg)  04/04/17 (!) 308 lb 9.6 oz (140 kg)     GEN: Morbidly obese. No acute distress HEENT: Normal NECK: No JVD. LYMPHATICS: No lymphadenopathy CARDIAC: Irregularly irregular RR without murmur, gallop.  There is bilateral  right greater than left lower extremity edema. VASCULAR: normal Pulses. No bruits. RESPIRATORY:  Clear to auscultation without rales, wheezing or rhonchi  ABDOMEN: Soft, non-tender, non-distended, No pulsatile mass, MUSCULOSKELETAL: No deformity  SKIN: Warm and dry NEUROLOGIC:  Alert and oriented x 3 PSYCHIATRIC:  Normal affect   ASSESSMENT:    1. Atrial fibrillation, persistent ([place])   2. Encounter for therapeutic drug monitoring   3. Chronic diastolic heart failure (HCC)   4. Stage 3b chronic kidney disease   5. Essential hypertension   6. Other hyperlipidemia   7. Educated about COVID-19 virus infection    PLAN:    In order of problems listed above:  1. Controlled rate 2. On warfarin therapy without bleeding complications 3. Resolved acute diastolic heart failure with increased diuretic regimen, furosemide 40 mg/day.  Basic metabolic panel and BNP will be obtained today. 4. Kidney function will be assessed by the basic metabolic panel obtained today. 5. Excellent blood pressure control. 6. We did not discuss his lipids. 7. The patient is well educated concerning the COVID-19 pandemic and the 3W's.  Clinical follow-up in 6 to 9 months.   Medication Adjustments/Labs and Tests Ordered: Current medicines are reviewed at length with the patient today.  Concerns regarding medicines are outlined above.  Orders Placed This Encounter  Procedures  . Basic metabolic panel  . Hepatic function panel  . Pro b natriuretic peptide   No orders of the defined types were placed in this encounter.   Patient Instructions  Medication Instructions:  Your physician recommends that you continue on your current  medications as directed. Please refer to the Current Medication list given to you today.  *If you need a refill on your cardiac medications before your next appointment, please call your pharmacy*  Lab Work: BMET, Liver and Pro BNP today  If you have labs (blood work) drawn today and your tests are completely normal, you will receive your results only by: Marland Kitchen MyChart Message (if you have MyChart) OR . A paper copy in the mail If you have any lab test that is abnormal or we need to change your treatment, we will call you to review the results.  Testing/Procedures: None  Follow-Up: At [facility], you and your health needs are our priority.  As part of our continuing mission to provide you with exceptional heart care, we have created designated Provider Care Teams.  These Care Teams include your primary Cardiologist (physician) and Advanced Practice Providers (APPs -  Physician Assistants and Nurse Practitioners) who all work together to provide you with the care you need, when you need it.  Your next appointment:   6 months-9 months  The format for your next appointment:   In Person  Provider:   You may see Harry Grooms, MD or one of the following Advanced Practice Providers on your designated Care Team:    Truitt Merle, NP  Cecilie Kicks, NP  Kathyrn Drown, NP   Other Instructions      Signed, Harry Grooms, MD  11/12/2018 4:26 PM    [place]

## 2018-11-12 ENCOUNTER — Other Ambulatory Visit: Payer: Self-pay

## 2018-11-12 ENCOUNTER — Encounter: Payer: Self-pay | Admitting: Interventional Cardiology

## 2018-11-12 ENCOUNTER — Ambulatory Visit (INDEPENDENT_AMBULATORY_CARE_PROVIDER_SITE_OTHER): Payer: Medicare Other | Admitting: Interventional Cardiology

## 2018-11-12 VITALS — BP 118/78 | HR 79 | Ht 71.0 in | Wt 309.4 lb

## 2018-11-12 DIAGNOSIS — N1832 Chronic kidney disease, stage 3b: Secondary | ICD-10-CM | POA: Diagnosis not present

## 2018-11-12 DIAGNOSIS — I5032 Chronic diastolic (congestive) heart failure: Secondary | ICD-10-CM

## 2018-11-12 DIAGNOSIS — I4819 Other persistent atrial fibrillation: Secondary | ICD-10-CM | POA: Diagnosis not present

## 2018-11-12 DIAGNOSIS — I130 Hypertensive heart and chronic kidney disease with heart failure and stage 1 through stage 4 chronic kidney disease, or unspecified chronic kidney disease: Secondary | ICD-10-CM

## 2018-11-12 DIAGNOSIS — Z5181 Encounter for therapeutic drug level monitoring: Secondary | ICD-10-CM | POA: Diagnosis not present

## 2018-11-12 DIAGNOSIS — E7849 Other hyperlipidemia: Secondary | ICD-10-CM

## 2018-11-12 DIAGNOSIS — I10 Essential (primary) hypertension: Secondary | ICD-10-CM

## 2018-11-12 DIAGNOSIS — Z7189 Other specified counseling: Secondary | ICD-10-CM

## 2018-11-12 NOTE — Patient Instructions (Signed)
Medication Instructions:  Your physician recommends that you continue on your current medications as directed. Please refer to the Current Medication list given to you today.  *If you need a refill on your cardiac medications before your next appointment, please call your pharmacy*  Lab Work: BMET, Liver and Pro BNP today  If you have labs (blood work) drawn today and your tests are completely normal, you will receive your results only by: Marland Kitchen MyChart Message (if you have MyChart) OR . A paper copy in the mail If you have any lab test that is abnormal or we need to change your treatment, we will call you to review the results.  Testing/Procedures: None  Follow-Up: At [facility], you and your health needs are our priority.  As part of our continuing mission to provide you with exceptional heart care, we have created designated Provider Care Teams.  These Care Teams include your primary Cardiologist (physician) and Advanced Practice Providers (APPs -  Physician Assistants and Nurse Practitioners) who all work together to provide you with the care you need, when you need it.  Your next appointment:   6 months-9 months  The format for your next appointment:   In Person  Provider:   You may see Sinclair Grooms, MD or one of the following Advanced Practice Providers on your designated Care Team:    Truitt Merle, NP  Cecilie Kicks, NP  Kathyrn Drown, NP   Other Instructions

## 2018-11-13 LAB — PRO B NATRIURETIC PEPTIDE: NT-Pro BNP: 2137 pg/mL — ABNORMAL HIGH (ref 0–376)

## 2018-11-13 LAB — HEPATIC FUNCTION PANEL
ALT: 18 IU/L (ref 0–44)
AST: 22 IU/L (ref 0–40)
Albumin: 4.3 g/dL (ref 3.7–4.7)
Alkaline Phosphatase: 90 IU/L (ref 39–117)
Bilirubin Total: 0.5 mg/dL (ref 0.0–1.2)
Bilirubin, Direct: 0.16 mg/dL (ref 0.00–0.40)
Total Protein: 6.9 g/dL (ref 6.0–8.5)

## 2018-11-13 LAB — BASIC METABOLIC PANEL
BUN/Creatinine Ratio: 16 (ref 10–24)
BUN: 30 mg/dL — ABNORMAL HIGH (ref 8–27)
CO2: 22 mmol/L (ref 20–29)
Calcium: 10.1 mg/dL (ref 8.6–10.2)
Chloride: 103 mmol/L (ref 96–106)
Creatinine, Ser: 1.86 mg/dL — ABNORMAL HIGH (ref 0.76–1.27)
GFR calc Af Amer: 41 mL/min/{1.73_m2} — ABNORMAL LOW (ref >59)
GFR calc non Af Amer: 35 mL/min/{1.73_m2} — ABNORMAL LOW (ref >59)
Glucose: 100 mg/dL — ABNORMAL HIGH (ref 65–99)
Potassium: 4.6 mmol/L (ref 3.5–5.2)
Sodium: 142 mmol/L (ref 134–144)

## 2018-11-14 ENCOUNTER — Telehealth: Payer: Self-pay | Admitting: *Deleted

## 2018-11-14 DIAGNOSIS — I5032 Chronic diastolic (congestive) heart failure: Secondary | ICD-10-CM

## 2018-11-14 MED ORDER — FUROSEMIDE 20 MG PO TABS: 60.0000 mg | ORAL_TABLET | Freq: Every day | ORAL | 0 refills | Status: DC

## 2018-11-14 NOTE — Telephone Encounter (Signed)
Spoke with pt and went over results and recommendations.  Pt verbalized understanding and was in agreement with this plan.  He prefers to take 60mg  a day in the fashion of three 20mg  tablets.  He will come for BMET on 12/17/2018.  Advised I will send prescription to pharmacy.  Pt appreciative for call.

## 2018-11-14 NOTE — Telephone Encounter (Signed)
-----  Message from Belva Crome, MD sent at 11/13/2018  1:11 PM EST ----- Let the patient know there is evidence of excessive fluid based upon the BNP.  Kidney function is relatively stable compared to the prior values over the past 12 months.  I recommend that he increase furosemide to 60 mg daily which could be taken as 80 mg alternated with 40 mg or 1-1/2 of a 40 mg tablet daily.  Should repeat a be met in 1 month. A copy will be sent to Josetta Huddle, MD

## 2018-11-19 ENCOUNTER — Other Ambulatory Visit: Payer: Self-pay

## 2018-11-19 ENCOUNTER — Ambulatory Visit (INDEPENDENT_AMBULATORY_CARE_PROVIDER_SITE_OTHER): Payer: Medicare Other | Admitting: *Deleted

## 2018-11-19 DIAGNOSIS — Z5181 Encounter for therapeutic drug level monitoring: Secondary | ICD-10-CM | POA: Diagnosis not present

## 2018-11-19 DIAGNOSIS — I4819 Other persistent atrial fibrillation: Secondary | ICD-10-CM

## 2018-11-19 LAB — POCT INR: INR: 2.5 (ref 2.0–3.0)

## 2018-11-19 NOTE — Patient Instructions (Addendum)
Description   Continue on same dosage 1 pill daily except for 2 pills on Mondays, Wednesdays, and Fridays. Recheck INR in 8 weeks. Call coumadin clinic for any questions [phone].

## 2018-11-26 ENCOUNTER — Other Ambulatory Visit: Payer: Self-pay | Admitting: Interventional Cardiology

## 2018-12-17 ENCOUNTER — Other Ambulatory Visit: Payer: Self-pay

## 2018-12-17 ENCOUNTER — Other Ambulatory Visit: Payer: Medicare Other | Admitting: *Deleted

## 2018-12-17 DIAGNOSIS — I5032 Chronic diastolic (congestive) heart failure: Secondary | ICD-10-CM

## 2018-12-18 ENCOUNTER — Telehealth: Payer: Self-pay | Admitting: *Deleted

## 2018-12-18 DIAGNOSIS — I5032 Chronic diastolic (congestive) heart failure: Secondary | ICD-10-CM

## 2018-12-18 LAB — BASIC METABOLIC PANEL
BUN/Creatinine Ratio: 16 (ref 10–24)
BUN: 33 mg/dL — ABNORMAL HIGH (ref 8–27)
CO2: 24 mmol/L (ref 20–29)
Calcium: 10 mg/dL (ref 8.6–10.2)
Chloride: 103 mmol/L (ref 96–106)
Creatinine, Ser: 2.04 mg/dL — ABNORMAL HIGH (ref 0.76–1.27)
GFR calc Af Amer: 36 mL/min/{1.73_m2} — ABNORMAL LOW (ref >59)
GFR calc non Af Amer: 31 mL/min/{1.73_m2} — ABNORMAL LOW (ref >59)
Glucose: 99 mg/dL (ref 65–99)
Potassium: 4.4 mmol/L (ref 3.5–5.2)
Sodium: 144 mmol/L (ref 134–144)

## 2018-12-18 NOTE — Telephone Encounter (Signed)
-----   Message from Belva Crome, MD sent at 12/18/2018 12:31 PM EST ----- Let the patient know the creatinine is higher than previous.  As long his weight is stable and he feels well, no changes are required.  He should have a repeat basic metabolic panel in 3 months.  This is primarily to make sure that the kidney function is stable. A copy will be sent to Josetta Huddle, MD

## 2018-12-18 NOTE — Telephone Encounter (Signed)
Spoke with pt and he states weight is stable and he is feeling great.  Advised to continue current medications and scheduled repeat labs on 03/18/2018.  Pt appreciative for call.

## 2019-01-14 ENCOUNTER — Ambulatory Visit: Payer: Medicare Other | Admitting: *Deleted

## 2019-01-14 ENCOUNTER — Other Ambulatory Visit: Payer: Self-pay

## 2019-01-14 DIAGNOSIS — I4819 Other persistent atrial fibrillation: Secondary | ICD-10-CM | POA: Diagnosis not present

## 2019-01-14 DIAGNOSIS — Z5181 Encounter for therapeutic drug level monitoring: Secondary | ICD-10-CM | POA: Diagnosis not present

## 2019-01-14 LAB — POCT INR: INR: 4 — AB (ref 2.0–3.0)

## 2019-01-14 NOTE — Patient Instructions (Addendum)
Description   Do not take any Warfarin today then continue taking 1 pill daily except for 2 pills on Mondays, Wednesdays, and Fridays. Resume normal dark green intake. Recheck INR in 4 weeks (normally 8 weeks). Call coumadin clinic for any questions [phone].

## 2019-01-22 ENCOUNTER — Ambulatory Visit: Payer: Medicare Other | Attending: Internal Medicine

## 2019-01-22 DIAGNOSIS — Z23 Encounter for immunization: Secondary | ICD-10-CM | POA: Insufficient documentation

## 2019-01-22 NOTE — Progress Notes (Signed)
   Covid-19 Vaccination Clinic  Name:  KHALID LACKO    MRN: [id number] DOB: 29-Dec-1945  01/22/2019  Mr. Gunn was observed post Covid-19 immunization for 15 minutes without incidence. He was provided with Vaccine Information Sheet and instruction to access the V-Safe system.   Mr. Rogerson was instructed to call 911 with any severe reactions post vaccine: Marland Kitchen Difficulty breathing  . Swelling of your face and throat  . A fast heartbeat  . A bad rash all over your body  . Dizziness and weakness    Immunizations Administered    Name Date Dose VIS Date Route   Pfizer COVID-19 Vaccine 01/22/2019  3:37 PM 0.3 mL 12/12/2018 Intramuscular   Manufacturer: [place]   Lot: [id number]   [address]: [id number]

## 2019-02-11 ENCOUNTER — Ambulatory Visit: Payer: Medicare Other | Admitting: *Deleted

## 2019-02-11 ENCOUNTER — Other Ambulatory Visit: Payer: Self-pay

## 2019-02-11 ENCOUNTER — Encounter (INDEPENDENT_AMBULATORY_CARE_PROVIDER_SITE_OTHER): Payer: Self-pay

## 2019-02-11 DIAGNOSIS — Z5181 Encounter for therapeutic drug level monitoring: Secondary | ICD-10-CM | POA: Diagnosis not present

## 2019-02-11 DIAGNOSIS — I4819 Other persistent atrial fibrillation: Secondary | ICD-10-CM

## 2019-02-11 LAB — POCT INR: INR: 2.1 (ref 2.0–3.0)

## 2019-02-11 NOTE — Patient Instructions (Signed)
Description   Continue taking 1 pill daily except for 2 pills on Mondays, Wednesdays, and Fridays. Resume normal dark green intake. Recheck INR in 5 weeks (normally 8 weeks). Call coumadin clinic for any questions [phone].

## 2019-02-12 ENCOUNTER — Ambulatory Visit: Payer: Medicare Other | Attending: Internal Medicine

## 2019-02-12 DIAGNOSIS — Z23 Encounter for immunization: Secondary | ICD-10-CM | POA: Insufficient documentation

## 2019-02-12 NOTE — Progress Notes (Signed)
   Covid-19 Vaccination Clinic  Name:  Harry Foster    MRN: [id number] DOB: [birth year]  02/12/2019  Harry Foster was observed post Covid-19 immunization for 15 minutes without incidence. He was provided with Vaccine Information Sheet and instruction to access the V-Safe system.   Harry Foster was instructed to call 911 with any severe reactions post vaccine: Marland Kitchen Difficulty breathing  . Swelling of your face and throat  . A fast heartbeat  . A bad rash all over your body  . Dizziness and weakness    Immunizations Administered    Name Date Dose VIS Date Route   Pfizer COVID-19 Vaccine 02/12/2019  3:34 PM 0.3 mL 12/12/2018 Intramuscular   Manufacturer: [place]   Lot: [id number]   [place]: [id number]

## 2019-02-23 ENCOUNTER — Other Ambulatory Visit: Payer: Self-pay | Admitting: Interventional Cardiology

## 2019-03-18 ENCOUNTER — Other Ambulatory Visit: Payer: Medicare Other | Admitting: *Deleted

## 2019-03-18 ENCOUNTER — Ambulatory Visit: Payer: Medicare Other | Admitting: Pharmacist

## 2019-03-18 ENCOUNTER — Other Ambulatory Visit: Payer: Self-pay

## 2019-03-18 DIAGNOSIS — I4819 Other persistent atrial fibrillation: Secondary | ICD-10-CM

## 2019-03-18 DIAGNOSIS — Z5181 Encounter for therapeutic drug level monitoring: Secondary | ICD-10-CM | POA: Diagnosis not present

## 2019-03-18 DIAGNOSIS — I5032 Chronic diastolic (congestive) heart failure: Secondary | ICD-10-CM

## 2019-03-18 LAB — POCT INR: INR: 2.6 (ref 2.0–3.0)

## 2019-03-18 NOTE — Patient Instructions (Signed)
Description   Continue taking 1 pill daily except for 2 pills on Mondays, Wednesdays, and Fridays. Recheck INR in 8 weeks. Call coumadin clinic for any questions [phone].

## 2019-03-19 LAB — BASIC METABOLIC PANEL
BUN/Creatinine Ratio: 15 (ref 10–24)
BUN: 32 mg/dL — ABNORMAL HIGH (ref 8–27)
CO2: 22 mmol/L (ref 20–29)
Calcium: 10.1 mg/dL (ref 8.6–10.2)
Chloride: 105 mmol/L (ref 96–106)
Creatinine, Ser: 2.09 mg/dL — ABNORMAL HIGH (ref 0.76–1.27)
GFR calc Af Amer: 35 mL/min/{1.73_m2} — ABNORMAL LOW (ref >59)
GFR calc non Af Amer: 30 mL/min/{1.73_m2} — ABNORMAL LOW (ref >59)
Glucose: 90 mg/dL (ref 65–99)
Potassium: 4.9 mmol/L (ref 3.5–5.2)
Sodium: 144 mmol/L (ref 134–144)

## 2019-03-23 ENCOUNTER — Telehealth: Payer: Self-pay | Admitting: *Deleted

## 2019-03-23 NOTE — Telephone Encounter (Signed)
Pt has been notified of lab results by phone with verbal understanding. Pt wanted to let Dr. Tamala Meaney know that his legs look better in regards to swelling. Pt states he still is having a problem with swelling on the top of his feet by the end of the day and he cannot wear regular shoes. He states not anything he can't live with though he did just want to let Dr. Tamala Nierenberg know. I assured the pt that I will send a message to [place], [place] for Dr. Tamala Roblero. Pt aware Dr. Tamala Kemnitz may not increase his fluid pills due to his kidney function. Pt thanked me for the call and the help. Patient notified of result.  Please refer to phone note from today for complete details.   Julaine Hua, [place] 03/23/2019 11:56 AM

## 2019-03-25 NOTE — Telephone Encounter (Signed)
Stay as we are.

## 2019-04-16 ENCOUNTER — Other Ambulatory Visit: Payer: Self-pay | Admitting: Interventional Cardiology

## 2019-05-13 ENCOUNTER — Ambulatory Visit: Payer: Medicare Other | Admitting: Pharmacist

## 2019-05-13 ENCOUNTER — Encounter (INDEPENDENT_AMBULATORY_CARE_PROVIDER_SITE_OTHER): Payer: Self-pay

## 2019-05-13 ENCOUNTER — Other Ambulatory Visit: Payer: Self-pay

## 2019-05-13 DIAGNOSIS — I4819 Other persistent atrial fibrillation: Secondary | ICD-10-CM | POA: Diagnosis not present

## 2019-05-13 DIAGNOSIS — Z5181 Encounter for therapeutic drug level monitoring: Secondary | ICD-10-CM

## 2019-05-13 LAB — POCT INR: INR: 3.1 — AB (ref 2.0–3.0)

## 2019-05-13 NOTE — Patient Instructions (Signed)
Take 1 tablet today then continue taking 1 pill daily except for 2 pills on Mondays, Wednesdays, and Fridays. Recheck INR in 3 weeks .Call coumadin clinic for any questions [phone].

## 2019-05-27 ENCOUNTER — Ambulatory Visit: Payer: Medicare Other | Admitting: Pharmacist

## 2019-05-27 ENCOUNTER — Other Ambulatory Visit: Payer: Self-pay

## 2019-05-27 DIAGNOSIS — I4819 Other persistent atrial fibrillation: Secondary | ICD-10-CM | POA: Diagnosis not present

## 2019-05-27 DIAGNOSIS — Z5181 Encounter for therapeutic drug level monitoring: Secondary | ICD-10-CM

## 2019-05-27 LAB — POCT INR: INR: 2.6 (ref 2.0–3.0)

## 2019-05-27 NOTE — Patient Instructions (Signed)
Continue taking 1 pill daily except for 2 pills on Mondays, Wednesdays, and Fridays. Recheck INR in 4 weeks.(normally an 8 weeker but started allopurinol/pred) Call coumadin clinic for any questions [phone].

## 2019-06-17 ENCOUNTER — Other Ambulatory Visit: Payer: Self-pay | Admitting: Interventional Cardiology

## 2019-06-29 ENCOUNTER — Other Ambulatory Visit: Payer: Self-pay

## 2019-06-29 ENCOUNTER — Ambulatory Visit: Payer: Medicare Other

## 2019-06-29 DIAGNOSIS — Z5181 Encounter for therapeutic drug level monitoring: Secondary | ICD-10-CM | POA: Diagnosis not present

## 2019-06-29 DIAGNOSIS — I4819 Other persistent atrial fibrillation: Secondary | ICD-10-CM | POA: Diagnosis not present

## 2019-06-29 LAB — POCT INR: INR: 3.9 — AB (ref 2.0–3.0)

## 2019-06-29 NOTE — Patient Instructions (Signed)
Description   Skip today's dosage of Warfarin, then resume same dosage 1 pill daily except for 2 pills on Mondays, Wednesdays, and Fridays. Recheck INR in 3 weeks.(normally an 8 weeker but started allopurinol/colchicine) Call coumadin clinic for any questions [phone].

## 2019-07-22 NOTE — Progress Notes (Signed)
Cardiology Office Note:    Date:  07/23/2019   ID:  Harry Foster, DOB 12-05-1945, MRN [id number]  PCP:  Josetta Huddle, MD  Cardiologist:  Sinclair Grooms, MD   Referring MD: Josetta Huddle, MD   Chief Complaint  Patient presents with  . Congestive Heart Failure  . Atrial Fibrillation    History of Present Illness:    Harry Foster is a 74 y.o. male with a hx of atrial fibrillation, hypertension, chronic diastolic heart failure, and chronic anticoagulation therapy.  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  He still complains of swelling in his feet related to volume overload.  Initiation of loop diuretic therapy markedly improved anasarca.  HCTZ was removed and furosemide gradually uptitrated to 60 mg/day.  He was having difficulty ambulating because of severe bilateral lower extremity edema.  He still feels that there is more fluid than he would like because he is unable to fit into any of his shoes.  He has not had dyspnea associated with his volume overload problem.  He does notice that since we started the loop diuretic, episodes of gout/severity of gout involvement has increased.  He is being followed by Dr. Buck Mam who has him taking allopurinol.  Past Medical History:  Diagnosis Date  . Arthritis    both knees and hands and left elbow, hip  . Depression    treated for depression when wife was very ill  . Diverticulosis    hx - no sx  . Dysrhythmia    hx A fib, on anticoagulants  . GI bleed 2010  . GI bleed    hx  . Hypertension   . Recurrent upper respiratory infection (URI)    completing treatment for sinus infection - with abx    Past Surgical History:  Procedure Laterality Date  . CARDIOVERSION  02/01/2011   Procedure: CARDIOVERSION;  Surgeon: Sinclair Grooms, MD;  Location: [facility];  Service: Cardiovascular;  Laterality: N/A;  .  SINUS EXPLORATION  1992   laser sinus surgery  . TONSILLECTOMY     age 59  . TOTAL KNEE ARTHROPLASTY  10/09/2011   Procedure: TOTAL KNEE ARTHROPLASTY;  Surgeon: Hessie Dibble, MD;  Location: [facility];  Service: Orthopedics;  Laterality: Left;  left total knee arthroplasty  . TOTAL KNEE ARTHROPLASTY Right 10/06/2013   DR DALLDORF  . TOTAL KNEE ARTHROPLASTY WITH REVISION COMPONENTS Right 10/06/2013   Procedure: TOTAL KNEE ARTHROPLASTY WITH REVISION COMPONENTS;  Surgeon: Hessie Dibble, MD;  Location: [facility];  Service: Orthopedics;  Laterality: Right;  . TREATMENT FISTULA ANAL  1992    Current Medications: Current Meds  Medication Sig  . allopurinol (ZYLOPRIM) 100 MG tablet Take 100 mg by mouth daily.  Marland Kitchen ascorbic acid (VITAMIN C) 1000 MG tablet Take 1,000 mg by mouth daily.    Marland Kitchen aspirin 81 MG EC tablet Take 81 mg by mouth daily. Swallow whole.  . Coenzyme Q10 (CO Q 10) 100 MG CAPS Take 100 mg by mouth daily.  . colchicine 0.6 MG tablet Take 0.6 mg by mouth 3 (three) times a week.  Marland Kitchen DHEA 50 MG TABS Take 50 mg by mouth daily.   . digoxin (LANOXIN) 0.125 MG tablet TAKE 1 TABLET BY MOUTH EVERY MONDAY, WEDNESDAY, AND FRIDAY.  . furosemide (LASIX) 20 MG tablet Take 4 tablets (80 mg total) by mouth daily.  Marland Kitchen  Lactobacillus (ACIDOPHILUS/BIFIDUS PO) Take 100 mg by mouth daily.  . Loperamide HCl (IMODIUM PO) Take 1 tablet by mouth daily as needed (loose stools).   . magnesium gluconate (MAGONATE) 500 MG tablet Take 500 mg by mouth daily.  . metoprolol succinate (TOPROL-XL) 50 MG 24 hr tablet TAKE 2 TABLETS BY MOUTH 2 TIMES DAILY. TAKE WITH OR IMMEDIATELY FOLLOWING A MEAL.  Marland Kitchen Misc Natural Products (TART CHERRY ADVANCED) CAPS Take 2 capsules by mouth daily.  . Multiple Vitamin (MULTIVITAMIN WITH MINERALS) TABS Take 2 tablets by mouth daily.  Marland Kitchen OLIVE LEAF EXTRACT PO Take 1 tablet by mouth daily.    Marland Kitchen olmesartan (BENICAR) 40 MG tablet TAKE 1 TABLET BY MOUTH DAILY. *STOP OLMESARTAN/HCTZ*  . OVER THE  COUNTER MEDICATION Take 1 capsule by mouth daily. Berry extract   . OVER THE COUNTER MEDICATION Take 1 capsule by mouth daily. Vitamin d 5000 units w/ iodine 1075mcg   . Pomegranate, Punica granatum, (POMEGRANATE PO) Take 250 mg by mouth daily.   . Saw Palmetto, Serenoa repens, (SAW PALMETTO PO) Take 500 mg by mouth daily.   Marland Kitchen triamcinolone (NASACORT) 55 MCG/ACT nasal inhaler Place 1 spray into both nostrils daily as needed (allergies).   Marland Kitchen VITAMIN E PO Take 360 mg by mouth daily.   Marland Kitchen warfarin (COUMADIN) 5 MG tablet TAKE AS DIRECTED BY COUMADIN CLINIC  . [DISCONTINUED] furosemide (LASIX) 20 MG tablet TAKE 3 TABLETS BY MOUTH DAILY.     Allergies:   Levaquin [levofloxacin in d5w] and Lisinopril   Social History   Socioeconomic History  . Marital status: Widowed    Spouse name: Not on file  . Number of children: Not on file  . Years of education: Not on file  . Highest education level: Not on file  Occupational History  . Not on file  Tobacco Use  . Smoking status: Never Smoker  . Smokeless tobacco: Never Used  Substance and Sexual Activity  . Alcohol use: No  . Drug use: No  . Sexual activity: Not on file  Other Topics Concern  . Not on file  Social History Narrative  . Not on file   Social Determinants of Health   Financial Resource Strain:   . Difficulty of Paying Living Expenses:   Food Insecurity:   . Worried About Charity fundraiser in the Last Year:   . Arboriculturist in the Last Year:   Transportation Needs:   . Film/video editor (Medical):   Marland Kitchen Lack of Transportation (Non-Medical):   Physical Activity:   . Days of Exercise per Week:   . Minutes of Exercise per Session:   Stress:   . Feeling of Stress :   Social Connections:   . Frequency of Communication with Friends and Family:   . Frequency of Social Gatherings with Friends and Family:   . Attends Religious Services:   . Active Member of Clubs or Organizations:   . Attends Archivist  Meetings:   Marland Kitchen Marital Status:      Family History: The patient's family history includes Heart disease in his father and mother.  ROS:   Please see the history of present illness.    He does not sleep well.  Many mornings he awakens extremely fatigued.  He sleeps in a recliner.  He is being considered for a study related to gout with Pegloticase like agent.  All other systems reviewed and are negative.  EKGs/Labs/Other Studies Reviewed:    The following  studies were reviewed today: No new imaging data  EKG:  EKG atrial fibrillation with controlled ventricular response at 83 bpm.  Low voltage.  No significant change compared to prior.  Recent Labs: 11/12/2018: ALT 18; NT-Pro BNP 2,137 03/18/2019: BUN 32; Creatinine, Ser 2.09; Potassium 4.9; Sodium 144  Recent Lipid Panel No results found for: CHOL, TRIG, HDL, CHOLHDL, VLDL, LDLCALC, LDLDIRECT  Physical Exam:    VS:  BP (!) 100/60   Pulse 83   Ht 5\' 11"  (1.803 m)   Wt (!) 312 lb 9.6 oz (141.8 kg)   SpO2 95%   BMI 43.60 kg/m     Wt Readings from Last 3 Encounters:  07/23/19 (!) 312 lb 9.6 oz (141.8 kg)  11/12/18 (!) 309 lb 6.4 oz (140.3 kg)  05/08/18 (!) 304 lb 9.6 oz (138.2 kg)     GEN: Morbid obesity. No acute distress HEENT: Normal NECK: No JVD. LYMPHATICS: No lymphadenopathy CARDIAC: Irregularly irregular RR without murmur, gallop, or edema. VASCULAR:  Normal Pulses. No bruits. RESPIRATORY:  Clear to auscultation without rales, wheezing or rhonchi  ABDOMEN: Soft, non-tender, non-distended, No pulsatile mass, MUSCULOSKELETAL: No deformity  SKIN: Warm and dry NEUROLOGIC:  Alert and oriented x 3 PSYCHIATRIC:  Normal affect   ASSESSMENT:    1. Atrial fibrillation, persistent ([facility])   2. Chronic diastolic heart failure ([place])   3. Essential hypertension   4. Stage 3b chronic kidney disease   5. Other hyperlipidemia   6. Long-term use of high-risk medication   7. Educated about COVID-19 virus infection    PLAN:     In order of problems listed above:  1. Controlled rate.  Lanoxin 0.125 mg/day is being used for control.  He is also on Toprol-XL 100 mg twice daily with good result. 2. Heart rate control has been important in improving diastolic heart failure and fluid retention.  Still with volume overload and has mild neck vein elevation.  Increase furosemide to 80 mg/day.  5 days hence, check basic metabolic panel.  Warned that gout could worsen.  He is willing to deal with that. 3. Continue Benicar and other medications listed above including Toprol-XL, and diuretic therapy. 4. Basic metabolic panel will be done 5 days after the increase in furosemide. 5. Antilipid therapy is not currently being used most recent LDL was 103. 6. Coumadin therapy will be continued.  Watch for evidence of bleeding. 7. He has been vaccinated and is practicing social distancing.   Medication Adjustments/Labs and Tests Ordered: Current medicines are reviewed at length with the patient today.  Concerns regarding medicines are outlined above.  Orders Placed This Encounter  Procedures  . Basic metabolic panel  . EKG 12-Lead   Meds ordered this encounter  Medications  . furosemide (LASIX) 20 MG tablet    Sig: Take 4 tablets (80 mg total) by mouth daily.    Dispense:  90 tablet    Refill:  6    Patient Instructions  Medication Instructions:  Increase furosemide to 80 mg once daily. Continue this dose until we call you about your lab work. *If you need a refill on your cardiac medications before your next appointment, please call your pharmacy*   Lab Work: BMET Wed.  If you have labs (blood work) drawn today and your tests are completely normal, you will receive your results only by: Marland Kitchen MyChart Message (if you have MyChart) OR . A paper copy in the mail If you have any lab test that is abnormal or  we need to change your treatment, we will call you to review the  results.   Testing/Procedures: None   Follow-Up: At [facility], you and your health needs are our priority.  As part of our continuing mission to provide you with exceptional heart care, we have created designated Provider Care Teams.  These Care Teams include your primary Cardiologist (physician) and Advanced Practice Providers (APPs -  Physician Assistants and Nurse Practitioners) who all work together to provide you with the care you need, when you need it.  We recommend signing up for the patient portal called "MyChart".  Sign up information is provided on this After Visit Summary.  MyChart is used to connect with patients for Virtual Visits (Telemedicine).  Patients are able to view lab/test results, encounter notes, upcoming appointments, etc.  Non-urgent messages can be sent to your provider as well.   To learn more about what you can do with MyChart, go to [email or web address].    Your next appointment:   6 month(s)  The format for your next appointment:   In Person  Provider:   You may see Sinclair Grooms, MD or one of the following Advanced Practice Providers on your designated Care Team:    Truitt Merle, NP  Cecilie Kicks, NP  Kathyrn Drown, NP        Signed, Sinclair Grooms, MD  07/23/2019 5:36 PM    [place]

## 2019-07-23 ENCOUNTER — Ambulatory Visit (INDEPENDENT_AMBULATORY_CARE_PROVIDER_SITE_OTHER): Payer: Medicare Other | Admitting: *Deleted

## 2019-07-23 ENCOUNTER — Other Ambulatory Visit: Payer: Self-pay

## 2019-07-23 ENCOUNTER — Ambulatory Visit: Payer: Medicare Other | Admitting: Interventional Cardiology

## 2019-07-23 ENCOUNTER — Encounter: Payer: Self-pay | Admitting: Interventional Cardiology

## 2019-07-23 VITALS — BP 100/60 | HR 83 | Ht 71.0 in | Wt 312.6 lb

## 2019-07-23 DIAGNOSIS — E7849 Other hyperlipidemia: Secondary | ICD-10-CM

## 2019-07-23 DIAGNOSIS — I5032 Chronic diastolic (congestive) heart failure: Secondary | ICD-10-CM

## 2019-07-23 DIAGNOSIS — I4819 Other persistent atrial fibrillation: Secondary | ICD-10-CM | POA: Diagnosis not present

## 2019-07-23 DIAGNOSIS — N1832 Chronic kidney disease, stage 3b: Secondary | ICD-10-CM | POA: Diagnosis not present

## 2019-07-23 DIAGNOSIS — Z5181 Encounter for therapeutic drug level monitoring: Secondary | ICD-10-CM | POA: Diagnosis not present

## 2019-07-23 DIAGNOSIS — I10 Essential (primary) hypertension: Secondary | ICD-10-CM | POA: Diagnosis not present

## 2019-07-23 DIAGNOSIS — Z7189 Other specified counseling: Secondary | ICD-10-CM

## 2019-07-23 DIAGNOSIS — Z79899 Other long term (current) drug therapy: Secondary | ICD-10-CM

## 2019-07-23 LAB — POCT INR: INR: 3 (ref 2.0–3.0)

## 2019-07-23 MED ORDER — FUROSEMIDE 20 MG PO TABS: 80.0000 mg | ORAL_TABLET | Freq: Every day | ORAL | 6 refills | Status: DC

## 2019-07-23 NOTE — Patient Instructions (Signed)
Medication Instructions:  Increase furosemide to 80 mg once daily. Continue this dose until we call you about your lab work. *If you need a refill on your cardiac medications before your next appointment, please call your pharmacy*   Lab Work: BMET Wed.  If you have labs (blood work) drawn today and your tests are completely normal, you will receive your results only by: Marland Kitchen MyChart Message (if you have MyChart) OR . A paper copy in the mail If you have any lab test that is abnormal or we need to change your treatment, we will call you to review the results.   Testing/Procedures: None   Follow-Up: At [facility], you and your health needs are our priority.  As part of our continuing mission to provide you with exceptional heart care, we have created designated Provider Care Teams.  These Care Teams include your primary Cardiologist (physician) and Advanced Practice Providers (APPs -  Physician Assistants and Nurse Practitioners) who all work together to provide you with the care you need, when you need it.  We recommend signing up for the patient portal called "MyChart".  Sign up information is provided on this After Visit Summary.  MyChart is used to connect with patients for Virtual Visits (Telemedicine).  Patients are able to view lab/test results, encounter notes, upcoming appointments, etc.  Non-urgent messages can be sent to your provider as well.   To learn more about what you can do with MyChart, go to [email or web address].    Your next appointment:   6 month(s)  The format for your next appointment:   In Person  Provider:   You may see Sinclair Grooms, MD or one of the following Advanced Practice Providers on your designated Care Team:    Truitt Merle, NP  Cecilie Kicks, NP  Kathyrn Drown, NP

## 2019-07-23 NOTE — Patient Instructions (Addendum)
Description   Tomorrow take 1 pill then continue taking 1 pill daily except for 2 pills on Mondays, Wednesdays, and Fridays. Add another leafy veggie to your diet. Recheck INR in 5 weeks. (normally an 8 weeker but started allopurinol/colchicine) Call coumadin clinic for any questions [phone].

## 2019-07-29 ENCOUNTER — Other Ambulatory Visit: Payer: Self-pay

## 2019-07-29 ENCOUNTER — Other Ambulatory Visit: Payer: Medicare Other | Admitting: *Deleted

## 2019-07-29 DIAGNOSIS — I5032 Chronic diastolic (congestive) heart failure: Secondary | ICD-10-CM

## 2019-07-30 ENCOUNTER — Other Ambulatory Visit: Payer: Self-pay | Admitting: *Deleted

## 2019-07-30 DIAGNOSIS — N189 Chronic kidney disease, unspecified: Secondary | ICD-10-CM

## 2019-07-30 LAB — BASIC METABOLIC PANEL
BUN/Creatinine Ratio: 21 (ref 10–24)
BUN: 47 mg/dL — ABNORMAL HIGH (ref 8–27)
CO2: 22 mmol/L (ref 20–29)
Calcium: 9.8 mg/dL (ref 8.6–10.2)
Chloride: 104 mmol/L (ref 96–106)
Creatinine, Ser: 2.25 mg/dL — ABNORMAL HIGH (ref 0.76–1.27)
GFR calc Af Amer: 32 mL/min/{1.73_m2} — ABNORMAL LOW (ref >59)
GFR calc non Af Amer: 28 mL/min/{1.73_m2} — ABNORMAL LOW (ref >59)
Glucose: 99 mg/dL (ref 65–99)
Potassium: 4.5 mmol/L (ref 3.5–5.2)
Sodium: 142 mmol/L (ref 134–144)

## 2019-08-03 ENCOUNTER — Telehealth: Payer: Self-pay | Admitting: Interventional Cardiology

## 2019-08-03 MED ORDER — FUROSEMIDE 80 MG PO TABS
80.0000 mg | ORAL_TABLET | Freq: Every day | ORAL | 3 refills | Status: DC
Start: 2019-08-03 — End: 2020-08-10

## 2019-08-03 NOTE — Telephone Encounter (Signed)
Pt calling requesting a new Rx be sent to his pharmacy for Furosemide 80 mg tablets. Pt states that his medication was changed and now pt has to take 4 tablets of furosemide 20 mg tablet and pt states that he was told that a new Rx would be sent into his pharmacy for furosemide 80 mg tablets, so pt would not have to take 4 tablets. Please address

## 2019-08-03 NOTE — Telephone Encounter (Signed)
°  Pt c/o medication issue:  1. Name of Medication: furosemide (LASIX) 20 MG tablet  2. How are you currently taking this medication (dosage and times per day)? Take 4 tablets (80 mg total) by mouth daily  3. Are you having a reaction (difficulty breathing--STAT)?  NA  4. What is your medication issue? Patient states that after his lab work last week the nurse was supposed to call him in a new prescription for a 80 mg pill so he won't have to take so many pills daily. Please call in to [facility]

## 2019-08-03 NOTE — Telephone Encounter (Signed)
New Rx has been sent in. Left message for patient to make him aware.

## 2019-08-27 ENCOUNTER — Other Ambulatory Visit: Payer: Self-pay

## 2019-08-27 ENCOUNTER — Ambulatory Visit: Payer: Medicare Other | Admitting: Pharmacist

## 2019-08-27 DIAGNOSIS — I4819 Other persistent atrial fibrillation: Secondary | ICD-10-CM | POA: Diagnosis not present

## 2019-08-27 DIAGNOSIS — Z5181 Encounter for therapeutic drug level monitoring: Secondary | ICD-10-CM

## 2019-08-27 LAB — POCT INR: INR: 3.3 — AB (ref 2.0–3.0)

## 2019-08-27 NOTE — Patient Instructions (Addendum)
Description   Skip your Coumadin today, then start taking 1 pill daily except for 2 pills on Mondays and Fridays. Add another leafy veggie to your diet. Recheck INR in 3 weeks.Call coumadin clinic for any questions [phone].

## 2019-09-17 ENCOUNTER — Other Ambulatory Visit: Payer: Self-pay

## 2019-09-17 ENCOUNTER — Ambulatory Visit: Payer: Medicare Other | Admitting: *Deleted

## 2019-09-17 DIAGNOSIS — I4819 Other persistent atrial fibrillation: Secondary | ICD-10-CM

## 2019-09-17 DIAGNOSIS — Z5181 Encounter for therapeutic drug level monitoring: Secondary | ICD-10-CM

## 2019-09-17 LAB — POCT INR: INR: 2.4 (ref 2.0–3.0)

## 2019-09-17 NOTE — Patient Instructions (Addendum)
Description   Continue taking Warfarin 1 pill daily except for 2 pills on Mondays and Fridays. Keep your  leafy veggies consistent. Recheck INR in 4 weeks.Call coumadin clinic for any questions [phone].

## 2019-09-24 ENCOUNTER — Other Ambulatory Visit: Payer: Self-pay | Admitting: Internal Medicine

## 2019-09-24 DIAGNOSIS — N1832 Chronic kidney disease, stage 3b: Secondary | ICD-10-CM

## 2019-10-05 ENCOUNTER — Ambulatory Visit
Admission: RE | Admit: 2019-10-05 | Discharge: 2019-10-05 | Disposition: A | Discharge status: Home / Self Care | Payer: Medicare Other | Source: Ambulatory Visit | Attending: Internal Medicine | Admitting: Internal Medicine

## 2019-10-05 DIAGNOSIS — N1832 Chronic kidney disease, stage 3b: Secondary | ICD-10-CM

## 2019-10-06 ENCOUNTER — Telehealth: Payer: Self-pay | Admitting: Hematology

## 2019-10-06 NOTE — Telephone Encounter (Signed)
Received a new hem referral from Dr. Joylene Grapes at [facility] for +m spike. Mr. Maret has been cld and scheduled to see Dr. Irene Limbo on 10/19 at 1pm. Pt aware to arrive 20 minutes early.

## 2019-10-17 ENCOUNTER — Ambulatory Visit: Payer: Medicare Other | Attending: Internal Medicine

## 2019-10-17 ENCOUNTER — Other Ambulatory Visit: Payer: Self-pay

## 2019-10-17 DIAGNOSIS — Z23 Encounter for immunization: Secondary | ICD-10-CM

## 2019-10-17 NOTE — Progress Notes (Signed)
   Covid-19 Vaccination Clinic  Name:  Harry Foster    MRN: [id number] DOB: 1945-12-04  10/17/2019  Harry Foster was observed post Covid-19 immunization for 15 minutes without incident. He was provided with Vaccine Information Sheet and instruction to access the V-Safe system.   Harry Foster was instructed to call 911 with any severe reactions post vaccine: Marland Kitchen Difficulty breathing  . Swelling of face and throat  . A fast heartbeat  . A bad rash all over body  . Dizziness and weakness

## 2019-10-20 ENCOUNTER — Other Ambulatory Visit: Payer: Self-pay

## 2019-10-20 ENCOUNTER — Ambulatory Visit: Payer: Medicare Other

## 2019-10-20 ENCOUNTER — Telehealth: Payer: Self-pay | Admitting: Hematology

## 2019-10-20 ENCOUNTER — Inpatient Hospital Stay: Payer: Medicare Other

## 2019-10-20 ENCOUNTER — Inpatient Hospital Stay: Payer: Medicare Other | Attending: Hematology | Admitting: Hematology

## 2019-10-20 VITALS — BP 92/55 | HR 77 | Temp 97.7°F | Resp 18 | Ht 71.0 in | Wt 303.4 lb

## 2019-10-20 DIAGNOSIS — I10 Essential (primary) hypertension: Secondary | ICD-10-CM | POA: Diagnosis not present

## 2019-10-20 DIAGNOSIS — D472 Monoclonal gammopathy: Secondary | ICD-10-CM

## 2019-10-20 DIAGNOSIS — I4891 Unspecified atrial fibrillation: Secondary | ICD-10-CM

## 2019-10-20 DIAGNOSIS — N1832 Chronic kidney disease, stage 3b: Secondary | ICD-10-CM

## 2019-10-20 DIAGNOSIS — E669 Obesity, unspecified: Secondary | ICD-10-CM

## 2019-10-20 DIAGNOSIS — Z79899 Other long term (current) drug therapy: Secondary | ICD-10-CM

## 2019-10-20 LAB — CBC WITH DIFFERENTIAL/PLATELET
Abs Immature Granulocytes: 0.01 10*3/uL (ref 0.00–0.07)
Basophils Absolute: 0.1 10*3/uL (ref 0.0–0.1)
Basophils Relative: 1 %
Eosinophils Absolute: 0.1 10*3/uL (ref 0.0–0.5)
Eosinophils Relative: 2 %
HCT: 46.1 % (ref 39.0–52.0)
Hemoglobin: 15.3 g/dL (ref 13.0–17.0)
Immature Granulocytes: 0 %
Lymphocytes Relative: 18 %
Lymphs Abs: 1 10*3/uL (ref 0.7–4.0)
MCH: 32.7 pg (ref 26.0–34.0)
MCHC: 33.2 g/dL (ref 30.0–36.0)
MCV: 98.5 fL (ref 80.0–100.0)
Monocytes Absolute: 0.5 10*3/uL (ref 0.1–1.0)
Monocytes Relative: 9 %
Neutro Abs: 4 10*3/uL (ref 1.7–7.7)
Neutrophils Relative %: 70 %
Platelets: 171 10*3/uL (ref 150–400)
RBC: 4.68 MIL/uL (ref 4.22–5.81)
RDW: 13.1 % (ref 11.5–15.5)
WBC: 5.7 10*3/uL (ref 4.0–10.5)
nRBC: 0 % (ref 0.0–0.2)

## 2019-10-20 LAB — CMP (CANCER CENTER ONLY)
ALT: 17 U/L (ref 0–44)
AST: 22 U/L (ref 15–41)
Albumin: 3.8 g/dL (ref 3.5–5.0)
Alkaline Phosphatase: 76 U/L (ref 38–126)
Anion gap: 8 (ref 5–15)
BUN: 62 mg/dL — ABNORMAL HIGH (ref 8–23)
CO2: 29 mmol/L (ref 22–32)
Calcium: 10.6 mg/dL — ABNORMAL HIGH (ref 8.9–10.3)
Chloride: 103 mmol/L (ref 98–111)
Creatinine: 2.81 mg/dL — ABNORMAL HIGH (ref 0.61–1.24)
GFR, Estimated: 21 mL/min — ABNORMAL LOW (ref >60)
Glucose, Bld: 105 mg/dL — ABNORMAL HIGH (ref 70–99)
Potassium: 4.3 mmol/L (ref 3.5–5.1)
Sodium: 140 mmol/L (ref 135–145)
Total Bilirubin: 0.6 mg/dL (ref 0.3–1.2)
Total Protein: 7.1 g/dL (ref 6.5–8.1)

## 2019-10-20 LAB — LACTATE DEHYDROGENASE: LDH: 209 U/L — ABNORMAL HIGH (ref 98–192)

## 2019-10-20 NOTE — Telephone Encounter (Signed)
Scheduled per 10/19 los. Printed AVS for pt.

## 2019-10-20 NOTE — Progress Notes (Signed)
  HEMATOLOGY/ONCOLOGY CONSULTATION NOTE  Date of Service: 10/20/2019  Patient Care Team: Gates, Robert, MD as PCP - General (Internal Medicine) Smith, Henry W, MD as PCP - Cardiology (Cardiology)  CHIEF COMPLAINTS/PURPOSE OF CONSULTATION:  Elevated M Spike  HISTORY OF PRESENTING ILLNESS:   Harry Foster is a wonderful 74 y.o. male who has been referred to us by Dr. Peeples for evaluation and management of M Spike. The pt reports that he is doing well overall.   The pt reports that he has Afib, which is being managed with Warfarin by Dr. Henry Smith. His HTN has been under control. Pt is being followed by Dr. Aryal for Gout which is not well-controlled. Pt reports finger stiffness and mobility challenges in the last 15 months. Pt has been avoiding sugary liquids and red meats. His uric acid is currently greater than 11. He is considering starting Pegloticase to control his Gout. Pt has been dealing with renal dysfunction for the last 10 years.   Most recent lab results (09/23/2019) of CBC w/diff & CMP is as follows: all values are WNL except for BUN at 48, Creatinine at 2.40, GFR Est Non Af Am at 26. 09/23/2019 SPEP shows all values are WNL except for M Spike at 0.8 09/23/2019 Kappa light chains at 34.4, Lambda light chans at 42.8, K/L light chain ratio at 0.8  On review of systems, pt reports joint pain/stiffness and denies new bone pain, abdominal pain, constipation, dysuria and any other symptoms.   On PMHx the pt reports Arthritis, Atrial fibrillation, HTN, Gout, Cardioversion, Stage 3b CKD.  On Family Hx the pt reports that his mother had breast and cervical cancer in her 60's and his sister had breast cancer in her 40-50's.   MEDICAL HISTORY:  Past Medical History:  Diagnosis Date  . Arthritis    both knees and hands and left elbow, hip  . Depression    treated for depression when wife was very ill  . Diverticulosis    hx - no sx  . Dysrhythmia    hx A fib, on  anticoagulants  . GI bleed 2010  . GI bleed    hx  . Hypertension   . Recurrent upper respiratory infection (URI)    completing treatment for sinus infection - with abx    SURGICAL HISTORY: Past Surgical History:  Procedure Laterality Date  . CARDIOVERSION  02/01/2011   Procedure: CARDIOVERSION;  Surgeon: Henry W Smith III, MD;  Location: [facility] OR;  Service: Cardiovascular;  Laterality: N/A;  . SINUS EXPLORATION  1992   laser sinus surgery  . TONSILLECTOMY     age 5  . TOTAL KNEE ARTHROPLASTY  10/09/2011   Procedure: TOTAL KNEE ARTHROPLASTY;  Surgeon: Peter G Dalldorf, MD;  Location: [facility] OR;  Service: Orthopedics;  Laterality: Left;  left total knee arthroplasty  . TOTAL KNEE ARTHROPLASTY Right 10/06/2013   DR DALLDORF  . TOTAL KNEE ARTHROPLASTY WITH REVISION COMPONENTS Right 10/06/2013   Procedure: TOTAL KNEE ARTHROPLASTY WITH REVISION COMPONENTS;  Surgeon: Peter G Dalldorf, MD;  Location: [facility] OR;  Service: Orthopedics;  Laterality: Right;  . TREATMENT FISTULA ANAL  1992    SOCIAL HISTORY: Social History   Socioeconomic History  . Marital status: Widowed    Spouse name: Not on file  . Number of children: Not on file  . Years of education: Not on file  . Highest education level: Not on file  Occupational History  . Not on file  Tobacco Use  .   Smoking status: Never Smoker  . Smokeless tobacco: Never Used  Substance and Sexual Activity  . Alcohol use: No  . Drug use: No  . Sexual activity: Not on file  Other Topics Concern  . Not on file  Social History Narrative  . Not on file   Social Determinants of Health   Financial Resource Strain:   . Difficulty of Paying Living Expenses: Not on file  Food Insecurity:   . Worried About Charity fundraiser in the Last Year: Not on file  . Ran Out of Food in the Last Year: Not on file  Transportation Needs:   . Lack of Transportation (Medical): Not on file  . Lack of Transportation (Non-Medical): Not on file  Physical Activity:    . Days of Exercise per Week: Not on file  . Minutes of Exercise per Session: Not on file  Stress:   . Feeling of Stress : Not on file  Social Connections:   . Frequency of Communication with Friends and Family: Not on file  . Frequency of Social Gatherings with Friends and Family: Not on file  . Attends Religious Services: Not on file  . Active Member of Clubs or Organizations: Not on file  . Attends Archivist Meetings: Not on file  . Marital Status: Not on file  Intimate Partner Violence:   . Fear of Current or Ex-Partner: Not on file  . Emotionally Abused: Not on file  . Physically Abused: Not on file  . Sexually Abused: Not on file    FAMILY HISTORY: Family History  Problem Relation Age of Onset  . Heart disease Mother   . Heart disease Father     ALLERGIES:  is allergic to levaquin [levofloxacin in d5w] and lisinopril.  MEDICATIONS:  Current Outpatient Medications  Medication Sig Dispense Refill  . allopurinol (ZYLOPRIM) 100 MG tablet Take 100 mg by mouth daily.    Marland Kitchen ascorbic acid (VITAMIN C) 1000 MG tablet Take 1,000 mg by mouth daily.      Marland Kitchen aspirin 81 MG EC tablet Take 81 mg by mouth daily. Swallow whole.    . Coenzyme Q10 (CO Q 10) 100 MG CAPS Take 100 mg by mouth daily.    . colchicine 0.6 MG tablet Take 0.6 mg by mouth 3 (three) times a week.    Marland Kitchen DHEA 50 MG TABS Take 50 mg by mouth daily.     . digoxin (LANOXIN) 0.125 MG tablet TAKE 1 TABLET BY MOUTH EVERY MONDAY, WEDNESDAY, AND FRIDAY. 45 tablet 3  . furosemide (LASIX) 80 MG tablet Take 1 tablet (80 mg total) by mouth daily. 90 tablet 3  . Lactobacillus (ACIDOPHILUS/BIFIDUS PO) Take 100 mg by mouth daily.    . Loperamide HCl (IMODIUM PO) Take 1 tablet by mouth daily as needed (loose stools).     . magnesium gluconate (MAGONATE) 500 MG tablet Take 500 mg by mouth daily.    . metoprolol succinate (TOPROL-XL) 50 MG 24 hr tablet TAKE 2 TABLETS BY MOUTH 2 TIMES DAILY. TAKE WITH OR IMMEDIATELY FOLLOWING A  MEAL. 120 tablet 4  . Misc Natural Products (TART CHERRY ADVANCED) CAPS Take 2 capsules by mouth daily.    . Multiple Vitamin (MULTIVITAMIN WITH MINERALS) TABS Take 2 tablets by mouth daily.    Marland Kitchen OLIVE LEAF EXTRACT PO Take 1 tablet by mouth daily.      Marland Kitchen olmesartan (BENICAR) 40 MG tablet TAKE 1 TABLET BY MOUTH DAILY. *STOP OLMESARTAN/HCTZ* 30 tablet 6  .  OVER THE COUNTER MEDICATION Take 1 capsule by mouth daily. Berry extract     . OVER THE COUNTER MEDICATION Take 1 capsule by mouth daily. Vitamin d 5000 units w/ iodine 1030mg     . Pomegranate, Punica granatum, (POMEGRANATE PO) Take 250 mg by mouth daily.     . Saw Palmetto, Serenoa repens, (SAW PALMETTO PO) Take 500 mg by mouth daily.     .Marland Kitchentriamcinolone (NASACORT) 55 MCG/ACT nasal inhaler Place 1 spray into both nostrils daily as needed (allergies).     .Marland KitchenVITAMIN E PO Take 360 mg by mouth daily.     .Marland Kitchenwarfarin (COUMADIN) 5 MG tablet TAKE AS DIRECTED BY COUMADIN CLINIC 60 tablet 3   No current facility-administered medications for this visit.    REVIEW OF SYSTEMS:    10 Point review of Systems was done is negative except as noted above.  PHYSICAL EXAMINATION: ECOG PERFORMANCE STATUS: 1 - Symptomatic but completely ambulatory  . Vitals:   10/20/19 1312  BP: (!) 92/55  Pulse: 77  Resp: 18  Temp: 97.7 F (36.5 C)  SpO2: 96%   Filed Weights   10/20/19 1312  Weight: (!) 303 lb 6.4 oz (137.6 kg)   .Body mass index is 42.32 kg/m.  GENERAL:alert, in no acute distress and comfortable SKIN: no acute rashes, no significant lesions EYES: conjunctiva are pink and non-injected, sclera anicteric OROPHARYNX: MMM, no exudates, no oropharyngeal erythema or ulceration NECK: supple, no JVD LYMPH:  no palpable lymphadenopathy in the cervical, axillary or inguinal regions LUNGS: clear to auscultation b/l with normal respiratory effort HEART: regular rate & rhythm ABDOMEN:  normoactive bowel sounds , non tender, not distended. Extremity:  2+ pedal edema b/l PSYCH: alert & oriented x 3 with fluent speech NEURO: no focal motor/sensory deficits  LABORATORY DATA:  I have reviewed the data as listed  . CBC Latest Ref Rng & Units 10/20/2019 10/08/2013 10/07/2013  WBC 4.0 - 10.5 K/uL 5.7 7.8 8.5  Hemoglobin 13.0 - 17.0 g/dL 15.3 13.2 13.7  Hematocrit 39 - 52 % 46.1 40.2 41.8  Platelets 150 - 400 K/uL 171 162 182    . CMP Latest Ref Rng & Units 10/20/2019 07/29/2019 03/18/2019  Glucose 70 - 99 mg/dL 105(H) 99 90  BUN 8 - 23 mg/dL 62(H) 47(H) 32(H)  Creatinine 0.61 - 1.24 mg/dL 2.81(H) 2.25(H) 2.09(H)  Sodium 135 - 145 mmol/L 140 142 144  Potassium 3.5 - 5.1 mmol/L 4.3 4.5 4.9  Chloride 98 - 111 mmol/L 103 104 105  CO2 22 - 32 mmol/L _0 Calcium 8.9 - 10.3 mg/dL 10.6(H) 9.8 10.1  Total Protein 6.5 - 8.1 g/dL 7.1 - -  Total Bilirubin 0.3 - 1.2 mg/dL 0.6 - -  Alkaline Phos 38 - 126 U/L 76 - -  AST 15 - 41 U/L 22 - -  ALT 0 - 44 U/L 17 - -     RADIOGRAPHIC STUDIES: I have personally reviewed the radiological images as listed and agreed with the findings in the report. [place]  Result Date: 10/05/2019 CLINICAL DATA:  Chronic renal disease stage IIIB, on hypertension medicine EXAM: RENAL / URINARY TRACT ULTRASOUND COMPLETE COMPARISON:  None. FINDINGS: Right Kidney: Renal measurements: 10.6 x 5.2 x 5.6 = volume: 162 mL. Slightly increased echogenicity and thin cortical parenchyma. No mass or hydronephrosis visualized. Left Kidney: Renal measurements: 10.5 x 4.2 x 5.2 = volume: 120 mL. Slightly increased echogenicity and thinning cortical parenchyma. No mass or hydronephrosis visualized.  Urinary bladder: Appears normal for degree of bladder distention. Other: None. IMPRESSION: 1. Findings suggesting of chronic renal parenchymal disease. 2. Otherwise unremarkable renal ultrasound. Electronically Signed   By: Morgane  Naveau M.D.   On: 10/05/2019 23:55    ASSESSMENT & PLAN:   74 yo with   1) Monoclonal paraproteinemia 2)  CKD - likely from HTN, obesity less likely from plasma cell dyscrasia. PLAN: -Discussed patient's most recent labs from 09/23/2019, blood counts are nml, kidney numbers are elevated. -Discussed 09/23/2019 M Spike at 0.8 & K/L light chain ratio is WNL at 0.8 -Advised pt that M Spike may be elevated reactively from inflammation from Gout. -Advised pt that M Spike is not large enough to be concerning. Would be more concerned if >2.5-3.0. -Advised pt that his CKD could be explained by his HTN or obesity. Its slow-moving process does not suggest myeloma as the cause.  -Discussed CRAB criteria: no hypercalcemia, renal dysfunction is otherwise explained, no anemia, no overt symptomatic bone lesions. -Advised pt that if his M Spike increases or remains elevated after his Gout is under control we would consider this MGUS. -Advised pt that the progression of MGUS to Multiple Myeloma is only 1-2% per year.  -Discussed watching with labs vs completing a BM Bx - pt prefers the conservative approach at this time. -Will get Whole body skeletal survey in 1 week -Will get labs today, including 24hr UPEP -Will see back in 3 weeks via phone   FOLLOW UP: Labs today Whole body skeletal survey in 1 week Phone visit with Dr Kale in 3 weeks    All of the patients questions were answered with apparent satisfaction. The patient knows to call the clinic with any problems, questions or concerns.  I spent 35 mins counseling the patient face to face. The total time spent in the appointment was 45 minutes and more than 50% was on counseling and direct patient cares.    Gautam Kale MD MS AAHIVMS [facility] C[facility] Hematology/Oncology Physician [facility]  (Office):       [phone] (Work cell):  [phone] (Fax):           [phone]  10/20/2019 9:14 AM  I, Jazzmine Knight, am acting as a scribe for Dr. Gautam Kale.   .I have reviewed the above documentation for accuracy and completeness, and I  agree with the above. .Gautam Kishore Kale MD    

## 2019-10-21 LAB — KAPPA/LAMBDA LIGHT CHAINS
Kappa free light chain: 45.5 mg/L — ABNORMAL HIGH (ref 3.3–19.4)
Kappa, lambda light chain ratio: 0.98 (ref 0.26–1.65)
Lambda free light chains: 46.6 mg/L — ABNORMAL HIGH (ref 5.7–26.3)

## 2019-10-22 ENCOUNTER — Ambulatory Visit: Payer: Medicare Other | Admitting: Pharmacist

## 2019-10-22 ENCOUNTER — Other Ambulatory Visit: Payer: Self-pay

## 2019-10-22 ENCOUNTER — Other Ambulatory Visit: Payer: Self-pay | Admitting: *Deleted

## 2019-10-22 DIAGNOSIS — I4819 Other persistent atrial fibrillation: Secondary | ICD-10-CM

## 2019-10-22 DIAGNOSIS — D472 Monoclonal gammopathy: Secondary | ICD-10-CM | POA: Diagnosis not present

## 2019-10-22 DIAGNOSIS — Z5181 Encounter for therapeutic drug level monitoring: Secondary | ICD-10-CM

## 2019-10-22 LAB — MULTIPLE MYELOMA PANEL, SERUM
Albumin SerPl Elph-Mcnc: 3.7 g/dL (ref 2.9–4.4)
Albumin/Glob SerPl: 1.4 (ref 0.7–1.7)
Alpha 1: 0.2 g/dL (ref 0.0–0.4)
Alpha2 Glob SerPl Elph-Mcnc: 0.6 g/dL (ref 0.4–1.0)
B-Globulin SerPl Elph-Mcnc: 0.8 g/dL (ref 0.7–1.3)
Gamma Glob SerPl Elph-Mcnc: 1.1 g/dL (ref 0.4–1.8)
Globulin, Total: 2.7 g/dL (ref 2.2–3.9)
IgA: 76 mg/dL (ref 61–437)
IgG (Immunoglobin G), Serum: 1171 mg/dL (ref 603–1613)
IgM (Immunoglobulin M), Srm: 41 mg/dL (ref 15–143)
M Protein SerPl Elph-Mcnc: 0.8 g/dL — ABNORMAL HIGH
Total Protein ELP: 6.4 g/dL (ref 6.0–8.5)

## 2019-10-22 LAB — POCT INR: INR: 4.2 — AB (ref 2.0–3.0)

## 2019-10-22 NOTE — Patient Instructions (Signed)
Description   Skip your Coumadin today and only take 1 tablet tomorrow, then continue taking Warfarin 1 pill daily except for 2 pills on Mondays and Fridays. Keep your leafy veggies consistent. Recheck INR in 2-3 weeks.Call coumadin clinic for any questions [phone].

## 2019-10-26 LAB — UPEP/UIFE/LIGHT CHAINS/TP, 24-HR UR
% BETA, Urine: 0 %
ALPHA 1 URINE: 0 %
Albumin, U: 100 %
Alpha 2, Urine: 0 %
Free Kappa Lt Chains,Ur: 22.81 mg/L (ref 0.63–113.79)
Free Kappa/Lambda Ratio: 8.54 (ref 1.03–31.76)
Free Lambda Lt Chains,Ur: 2.67 mg/L (ref 0.47–11.77)
GAMMA GLOBULIN URINE: 0 %
Total Protein, Urine-Ur/day: 77 mg/24 hr (ref 30–150)
Total Protein, Urine: 4.8 mg/dL
Total Volume: 1600

## 2019-10-27 ENCOUNTER — Ambulatory Visit (HOSPITAL_COMMUNITY)
Admission: RE | Admit: 2019-10-27 | Discharge: 2019-10-27 | Disposition: A | Discharge status: Home / Self Care | Payer: Medicare Other | Source: Ambulatory Visit | Attending: Hematology | Admitting: Hematology

## 2019-10-27 ENCOUNTER — Other Ambulatory Visit: Payer: Self-pay

## 2019-10-27 DIAGNOSIS — D472 Monoclonal gammopathy: Secondary | ICD-10-CM | POA: Insufficient documentation

## 2019-11-10 ENCOUNTER — Inpatient Hospital Stay: Payer: Medicare Other | Attending: Hematology | Admitting: Hematology

## 2019-11-10 DIAGNOSIS — E559 Vitamin D deficiency, unspecified: Secondary | ICD-10-CM | POA: Insufficient documentation

## 2019-11-10 DIAGNOSIS — D472 Monoclonal gammopathy: Secondary | ICD-10-CM | POA: Diagnosis present

## 2019-11-10 DIAGNOSIS — Z79899 Other long term (current) drug therapy: Secondary | ICD-10-CM | POA: Diagnosis not present

## 2019-11-10 DIAGNOSIS — N1832 Chronic kidney disease, stage 3b: Secondary | ICD-10-CM | POA: Diagnosis not present

## 2019-11-10 DIAGNOSIS — M8580 Other specified disorders of bone density and structure, unspecified site: Secondary | ICD-10-CM | POA: Diagnosis not present

## 2019-11-10 DIAGNOSIS — Z7901 Long term (current) use of anticoagulants: Secondary | ICD-10-CM | POA: Insufficient documentation

## 2019-11-10 DIAGNOSIS — I129 Hypertensive chronic kidney disease with stage 1 through stage 4 chronic kidney disease, or unspecified chronic kidney disease: Secondary | ICD-10-CM | POA: Insufficient documentation

## 2019-11-10 NOTE — Progress Notes (Signed)
HEMATOLOGY/ONCOLOGY CLINIC NOTE  Date of Service: 11/10/2019  Patient Care Team: Josetta Huddle, MD as PCP - General (Internal Medicine) Belva Crome, MD as PCP - Cardiology (Cardiology)  CHIEF COMPLAINTS/PURPOSE OF CONSULTATION:  Elevated M Spike  HISTORY OF PRESENTING ILLNESS:   Harry Foster is a wonderful 74 y.o. male who has been referred to [place] by Dr. Joylene Grapes for evaluation and management of M Spike. The pt reports that he is doing well overall.   The pt reports that he has Afib, which is being managed with Warfarin by Dr. Daneen Schick. His HTN has been under control. Pt is being followed by Dr. Kathlene November for Gout which is not well-controlled. Pt reports finger stiffness and mobility challenges in the last 15 months. Pt has been avoiding sugary liquids and red meats. His uric acid is currently greater than 11. He is considering starting Pegloticase to control his Gout. Pt has been dealing with renal dysfunction for the last 10 years.   Most recent lab results (09/23/2019) of CBC w/diff & CMP is as follows: all values are WNL except for BUN at 48, Creatinine at 2.40, GFR Est Non Af Am at 26. 09/23/2019 SPEP shows all values are WNL except for M Spike at 0.8 09/23/2019 Kappa light chains at 34.4, Lambda light chans at 42.8, K/L light chain ratio at 0.8  On review of systems, pt reports joint pain/stiffness and denies new bone pain, abdominal pain, constipation, dysuria and any other symptoms.   On PMHx the pt reports Arthritis, Atrial fibrillation, HTN, Gout, Cardioversion, Stage 3b CKD.  On Family Hx the pt reports that his mother had breast and cervical cancer in her 2's and his sister had breast cancer in her 58-50's.   INTERVAL HISTORY: I connected with  Harry Foster on 11/10/19 by telephone and verified that I am speaking with the correct person using two identifiers.   I discussed the limitations of evaluation and management by telemedicine. The patient expressed  understanding and agreed to proceed.  Other persons participating in the visit and their role in the encounter:      -Yevette Edwards, Medical Scribe  Patient's location: Home Provider's location: [place] [facility] [place] is a wonderful 74 y.o. male who is here for evaluation and management of M Spike. The patient's last visit with [place] was on 10/20/2019. The pt reports that he is doing well overall.  The pt reports that he is currently taking 7000 IU Vitamin D and 160 mg Lasix daily. Pt denies any new symptoms or concerns.  Of note since the patient's last visit, pt has had Bone Survey XR ([id number]) completed on 10/27/2019 with results revealing "Severe osteopenia. Severe osteopenia can be result of multiple myeloma. However no clear-cut focal lytic lesions noted to suggest metastatic disease/myeloma."  Lab results (10/20/2019) of CBC w/diff and CMP is as follows: all values are WNL except for Glucose at 105, BUN at 62, Creatinine at 2.81, Calcium at 10.6, GFR Est at 21. 10/20/2019 LDH at 209 10/20/2019 K/L light chains is as follows: Kappa free light chain at 45.5, Lamda free light chains at 46.6, K/L light chain ratio at 0.98 10/20/2019 MMP shows all values are WNL except for M Protein at 0.8. 10/22/2019 24-hr Ur UPEP shows all values are WNL  On review of systems, pt denies any other symptoms.   MEDICAL HISTORY:  Past Medical History:  Diagnosis Date  . Arthritis    both knees and  hands and left elbow, hip  . Depression    treated for depression when wife was very ill  . Diverticulosis    hx - no sx  . Dysrhythmia    hx A fib, on anticoagulants  . GI bleed 2010  . GI bleed    hx  . Hypertension   . Recurrent upper respiratory infection (URI)    completing treatment for sinus infection - with abx    SURGICAL HISTORY: Past Surgical History:  Procedure Laterality Date  . CARDIOVERSION  02/01/2011   Procedure: CARDIOVERSION;  Surgeon: Sinclair Grooms, MD;   Location: [facility];  Service: Cardiovascular;  Laterality: N/A;  . SINUS EXPLORATION  1992   laser sinus surgery  . TONSILLECTOMY     age 35  . TOTAL KNEE ARTHROPLASTY  10/09/2011   Procedure: TOTAL KNEE ARTHROPLASTY;  Surgeon: Hessie Dibble, MD;  Location: [facility];  Service: Orthopedics;  Laterality: Left;  left total knee arthroplasty  . TOTAL KNEE ARTHROPLASTY Right 10/06/2013   DR DALLDORF  . TOTAL KNEE ARTHROPLASTY WITH REVISION COMPONENTS Right 10/06/2013   Procedure: TOTAL KNEE ARTHROPLASTY WITH REVISION COMPONENTS;  Surgeon: Hessie Dibble, MD;  Location: [facility];  Service: Orthopedics;  Laterality: Right;  . TREATMENT FISTULA ANAL  1992    SOCIAL HISTORY: Social History   Socioeconomic History  . Marital status: Widowed    Spouse name: Not on file  . Number of children: Not on file  . Years of education: Not on file  . Highest education level: Not on file  Occupational History  . Not on file  Tobacco Use  . Smoking status: Never Smoker  . Smokeless tobacco: Never Used  Substance and Sexual Activity  . Alcohol use: No  . Drug use: No  . Sexual activity: Not on file  Other Topics Concern  . Not on file  Social History Narrative  . Not on file   Social Determinants of Health   Financial Resource Strain:   . Difficulty of Paying Living Expenses: Not on file  Food Insecurity:   . Worried About Charity fundraiser in the Last Year: Not on file  . Ran Out of Food in the Last Year: Not on file  Transportation Needs:   . Lack of Transportation (Medical): Not on file  . Lack of Transportation (Non-Medical): Not on file  Physical Activity:   . Days of Exercise per Week: Not on file  . Minutes of Exercise per Session: Not on file  Stress:   . Feeling of Stress : Not on file  Social Connections:   . Frequency of Communication with Friends and Family: Not on file  . Frequency of Social Gatherings with Friends and Family: Not on file  . Attends Religious Services: Not on  file  . Active Member of Clubs or Organizations: Not on file  . Attends Archivist Meetings: Not on file  . Marital Status: Not on file  Intimate Partner Violence:   . Fear of Current or Ex-Partner: Not on file  . Emotionally Abused: Not on file  . Physically Abused: Not on file  . Sexually Abused: Not on file    FAMILY HISTORY: Family History  Problem Relation Age of Onset  . Heart disease Mother   . Heart disease Father     ALLERGIES:  is allergic to levaquin [levofloxacin in d5w] and lisinopril.  MEDICATIONS:  Current Outpatient Medications  Medication Sig Dispense Refill  . allopurinol (ZYLOPRIM)  100 MG tablet Take 200 mg by mouth daily.     Marland Kitchen ascorbic acid (VITAMIN C) 1000 MG tablet Take 1,000 mg by mouth daily.      Marland Kitchen aspirin 81 MG EC tablet Take 81 mg by mouth daily. Swallow whole.    . Coenzyme Q10 (CO Q 10) 100 MG CAPS Take 100 mg by mouth daily.    . colchicine 0.6 MG tablet Take 0.6 mg by mouth 3 (three) times a week.    Marland Kitchen DHEA 50 MG TABS Take 50 mg by mouth daily.     . digoxin (LANOXIN) 0.125 MG tablet TAKE 1 TABLET BY MOUTH EVERY MONDAY, WEDNESDAY, AND FRIDAY. 45 tablet 3  . furosemide (LASIX) 80 MG tablet Take 1 tablet (80 mg total) by mouth daily. (Patient taking differently: Take 80 mg by mouth 2 (two) times daily. ) 90 tablet 3  . Lactobacillus (ACIDOPHILUS/BIFIDUS PO) Take 100 mg by mouth daily.    . Loperamide HCl (IMODIUM PO) Take 1 tablet by mouth daily as needed (loose stools).     . magnesium gluconate (MAGONATE) 500 MG tablet Take 500 mg by mouth daily.    . metoprolol succinate (TOPROL-XL) 50 MG 24 hr tablet TAKE 2 TABLETS BY MOUTH 2 TIMES DAILY. TAKE WITH OR IMMEDIATELY FOLLOWING A MEAL. 120 tablet 4  . Misc Natural Products (TART CHERRY ADVANCED) CAPS Take 2 capsules by mouth daily.    . Multiple Vitamin (MULTIVITAMIN WITH MINERALS) TABS Take 2 tablets by mouth daily.    Marland Kitchen OLIVE LEAF EXTRACT PO Take 1 tablet by mouth daily.      Marland Kitchen olmesartan  (BENICAR) 40 MG tablet TAKE 1 TABLET BY MOUTH DAILY. *STOP OLMESARTAN/HCTZ* 30 tablet 6  . OVER THE COUNTER MEDICATION Take 1 capsule by mouth daily. Berry extract     . OVER THE COUNTER MEDICATION Take 1 capsule by mouth daily. Vitamin d 5000 units w/ iodine 1070mg     . Pomegranate, Punica granatum, (POMEGRANATE PO) Take 250 mg by mouth daily.     . Saw Palmetto, Serenoa repens, (SAW PALMETTO PO) Take 500 mg by mouth daily.     .Marland Kitchentriamcinolone (NASACORT) 55 MCG/ACT nasal inhaler Place 1 spray into both nostrils daily as needed (allergies).     .Marland KitchenVITAMIN E PO Take 360 mg by mouth daily.     .Marland Kitchenwarfarin (COUMADIN) 5 MG tablet TAKE AS DIRECTED BY COUMADIN CLINIC 60 tablet 3   No current facility-administered medications for this visit.    REVIEW OF SYSTEMS:   A 10+ POINT REVIEW OF SYSTEMS WAS OBTAINED including neurology, dermatology, psychiatry, cardiac, respiratory, lymph, extremities, GI, GU, Musculoskeletal, constitutional, breasts, reproductive, HEENT.  All pertinent positives are noted in the HPI.  All others are negative.   PHYSICAL EXAMINATION: ECOG PERFORMANCE STATUS: 1 - Symptomatic but completely ambulatory  Telehealth visit 11/10/2019  LABORATORY DATA:  I have reviewed the data as listed  . CBC Latest Ref Rng & Units 10/20/2019 10/08/2013 10/07/2013  WBC 4.0 - 10.5 K/uL 5.7 7.8 8.5  Hemoglobin 13.0 - 17.0 g/dL 15.3 13.2 13.7  Hematocrit 39 - 52 % 46.1 40.2 41.8  Platelets 150 - 400 K/uL 171 162 182    . CMP Latest Ref Rng & Units 10/20/2019 07/29/2019 03/18/2019  Glucose 70 - 99 mg/dL 105(H) 99 90  BUN 8 - 23 mg/dL 62(H) 47(H) 32(H)  Creatinine 0.61 - 1.24 mg/dL 2.81(H) 2.25(H) 2.09(H)  Sodium 135 - 145 mmol/L 140 142 144  Potassium 3.5 -  5.1 mmol/L 4.3 4.5 4.9  Chloride 98 - 111 mmol/L 103 104 105  CO2 22 - 32 mmol/L _0 Calcium 8.9 - 10.3 mg/dL 10.6(H) 9.8 10.1  Total Protein 6.5 - 8.1 g/dL 7.1 - -  Total Bilirubin 0.3 - 1.2 mg/dL 0.6 - -  Alkaline Phos 38 -  126 U/L 76 - -  AST 15 - 41 U/L 22 - -  ALT 0 - 44 U/L 17 - -     RADIOGRAPHIC STUDIES: I have personally reviewed the radiological images as listed and agreed with the findings in the report. DG Bone Survey Met  Result Date: 10/30/2019 CLINICAL DATA:  Monoclonal paraproteinemia. EXAM: METASTATIC BONE SURVEY COMPARISON:  Chest x-ray 09/28/2013. FINDINGS: Standard imaging of the axial and appendicular skeleton performed. Diffuse severe osteopenia. Osteopenia can be result of multiple myeloma. No clear-cut focal lytic lesions noted to suggest metastatic disease/myeloma. Diffuse severe degenerative changes noted throughout the spine. Diffuse multi joint degenerative changes. Bilateral total knee replacements. Subcortical lucency noted about the ulnar epicondyle of the right distal humerus most likely from prior injury and or degenerative change. Similar findings noted about the left ulnar epicondyle of the left distal humerus. No acute cardiopulmonary disease is identified. Aortic atherosclerotic vascular calcification. IMPRESSION: Severe osteopenia. Severe osteopenia can be result of multiple myeloma. However no clear-cut focal lytic lesions noted to suggest metastatic disease/myeloma. Electronically Signed   By: Marcello Moores  Register   On: 10/30/2019 07:51    ASSESSMENT & PLAN:   74 yo with   1) Monoclonal paraproteinemia 2) CKD - likely from HTN, obesity less likely from plasma cell dyscrasia. PLAN: -Discussed pt labwork, 10/20/2019; blood counts are nml, Creatinine is elevated significantly, Calcium is borderline high, blood chemistris reflect dehydration, K/L light chain ratio & 24-hr UPEP are WNL, LDH is borderline elevated, M Protein is stable.  -Discussed 10/27/2019 Bone Survey XR ([id number]) which revealed "Severe osteopenia. However no clear-cut focal lytic lesions noted to suggest metastatic disease/myeloma." -Advised pt that this does not appear to by Multiple Myeloma based on nml 24-hr  UPEP & K/L ratio and stable M Protein. -Advised pt that his Nephrologist may chose to get a kidney biopsy to r/o AL Amylodosis, although there are other explanations for pt's CKD. -Advised pt that CKD, Vitamin D deficiency, and low testosterone can contribute to weakened bones.  -Advised pt that CKD may cause insufficient absorption of certain forms of Vitamin D. -Recommend pt complete Bone Density Study with PCP for further osteopenia evaluation. -Recommend pt f/u with Dr. Kathlene November to ensure that Pegloticase is not kidney toxic.  -Will see back in 4 months, with labs 1 week prior.   FOLLOW UP: RTC with Dr Irene Limbo with labs in 4 months. Plz schedule the lab appointment 1 week prior to clinic visit   The total time spent in the appt was 20 minutes and more than 50% was on counseling and direct patient cares.  All of the patient's questions were answered with apparent satisfaction. The patient knows to call the clinic with any problems, questions or concerns.    Sullivan Lone MD [place] AAHIVMS [facility] Hematology/Oncology Physician [facility]  (Office):       [phone] (Work cell):  [phone] (Fax):           [phone]  11/10/2019 10:48 AM  I, Yevette Edwards, am acting as a scribe for Dr. Sullivan Lone.   .I have reviewed the above documentation for accuracy and completeness, and I  agree with the above. Brunetta Genera MD

## 2019-11-12 ENCOUNTER — Other Ambulatory Visit: Payer: Self-pay

## 2019-11-12 ENCOUNTER — Ambulatory Visit: Payer: Medicare Other | Admitting: *Deleted

## 2019-11-12 DIAGNOSIS — I4819 Other persistent atrial fibrillation: Secondary | ICD-10-CM | POA: Diagnosis not present

## 2019-11-12 DIAGNOSIS — Z5181 Encounter for therapeutic drug level monitoring: Secondary | ICD-10-CM

## 2019-11-12 LAB — POCT INR: INR: 3.3 — AB (ref 2.0–3.0)

## 2019-11-12 NOTE — Patient Instructions (Signed)
Description   Skip your Coumadin today, then start taking Warfarin 1 pill daily except for 2 pills on Fridays. Keep your leafy veggies consistent. Recheck INR in 3 weeks per pt. Call coumadin clinic for any questions [phone].

## 2019-11-16 ENCOUNTER — Other Ambulatory Visit: Payer: Self-pay | Admitting: Interventional Cardiology

## 2019-12-03 ENCOUNTER — Ambulatory Visit (INDEPENDENT_AMBULATORY_CARE_PROVIDER_SITE_OTHER): Payer: Medicare Other | Admitting: *Deleted

## 2019-12-03 ENCOUNTER — Other Ambulatory Visit: Payer: Self-pay

## 2019-12-03 DIAGNOSIS — I4819 Other persistent atrial fibrillation: Secondary | ICD-10-CM

## 2019-12-03 DIAGNOSIS — Z5181 Encounter for therapeutic drug level monitoring: Secondary | ICD-10-CM

## 2019-12-03 LAB — POCT INR: INR: 3 (ref 2.0–3.0)

## 2019-12-03 NOTE — Patient Instructions (Addendum)
Description   Contine taking Warfarin 1 pill daily except for 2 pills on Fridays. Have some leafy veggies and keep your leafy veggies consistent. Recheck INR in 4 weeks. Call Coumadin Clinic for any questions/updates at [phone].

## 2019-12-14 ENCOUNTER — Other Ambulatory Visit: Payer: Self-pay | Admitting: Interventional Cardiology

## 2019-12-28 ENCOUNTER — Ambulatory Visit (INDEPENDENT_AMBULATORY_CARE_PROVIDER_SITE_OTHER): Payer: Medicare Other | Admitting: *Deleted

## 2019-12-28 ENCOUNTER — Other Ambulatory Visit: Payer: Self-pay

## 2019-12-28 DIAGNOSIS — Z5181 Encounter for therapeutic drug level monitoring: Secondary | ICD-10-CM | POA: Diagnosis not present

## 2019-12-28 DIAGNOSIS — I4819 Other persistent atrial fibrillation: Secondary | ICD-10-CM | POA: Diagnosis not present

## 2019-12-28 LAB — POCT INR: INR: 2.4 (ref 2.0–3.0)

## 2019-12-28 NOTE — Patient Instructions (Addendum)
Description   Contine taking Warfarin 1 pill daily except for 2 pills on Fridays. Recheck INR in 4 weeks. Call Coumadin Clinic for any questions/updates at [phone].

## 2019-12-31 ENCOUNTER — Telehealth: Payer: Self-pay | Admitting: Hematology

## 2019-12-31 NOTE — Telephone Encounter (Signed)
Rescheduled lab appointment per 12/30 schedule message. Patient is aware of changes.

## 2020-01-14 DIAGNOSIS — M1A9XX1 Chronic gout, unspecified, with tophus (tophi): Secondary | ICD-10-CM | POA: Diagnosis not present

## 2020-01-26 NOTE — Progress Notes (Signed)
Cardiology Office Note:    Date:  01/27/2020   ID:  JASMEET GEHL, DOB 1945-02-25, MRN [id number]  PCP:  Josetta Huddle, MD  Cardiologist:  Sinclair Grooms, MD   Referring MD: Josetta Huddle, MD   Chief Complaint  Patient presents with  . Atrial Fibrillation  . Congestive Heart Failure    History of Present Illness:    Harry Foster is a 75 y.o. male with a hx of atrial fibrillation, hypertension, chronic diastolic heart failure, and chronic anticoagulation therapy.  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  The entire conversation today was about gout.  He is getting an infusion from his rheumatologist, Dr. Sharol Given.  It is Investment banker, corporate.  Since starting therapy which should cause reverse transfer of uric acid from.  As of 12 5, his gout has been much worse.  He is also seeing Dr. Osborne Casco at the [facility].  Lasix dose was increased to 80 mg twice per day.  Having dental issues with jaw discomfort.  Wants to know if Coumadin dose should be adjusted.  This will depend upon what is prescribed from it procedural/surgical standpoint.  They will contact us.  He has seen Dr. Irene Limbo at the [facility].  He cannot tell me why Dr. 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  Past Medical History:  Diagnosis Date  . Arthritis    both knees and hands and left elbow, hip  . Depression    treated for depression when wife was very ill  . Diverticulosis    hx - no sx  . Dysrhythmia    hx A fib, on anticoagulants  . GI bleed 2010  . GI bleed    hx  . Hypertension   . Recurrent upper respiratory infection (URI)    completing treatment for sinus infection - with abx    Past Surgical History:  Procedure Laterality Date  . CARDIOVERSION  02/01/2011   Procedure: CARDIOVERSION;  Surgeon: Sinclair Grooms, MD;  Location: [place];   Service: Cardiovascular;  Laterality: N/A;  . SINUS EXPLORATION  1992   laser sinus surgery  . TONSILLECTOMY     age 78  . TOTAL KNEE ARTHROPLASTY  10/09/2011   Procedure: TOTAL KNEE ARTHROPLASTY;  Surgeon: Hessie Dibble, MD;  Location: [place];  Service: Orthopedics;  Laterality: Left;  left total knee arthroplasty  . TOTAL KNEE ARTHROPLASTY Right 10/06/2013   DR DALLDORF  . TOTAL KNEE ARTHROPLASTY WITH REVISION COMPONENTS Right 10/06/2013   Procedure: TOTAL KNEE ARTHROPLASTY WITH REVISION COMPONENTS;  Surgeon: Hessie Dibble, MD;  Location: [place];  Service: Orthopedics;  Laterality: Right;  . TREATMENT FISTULA ANAL  1992    Current Medications: Current Meds  Medication Sig  . ascorbic acid (VITAMIN C) 1000 MG tablet Take 1,000 mg by mouth daily.  Marland Kitchen aspirin 81 MG EC tablet Take 81 mg by mouth daily. Swallow whole.  . Coenzyme Q10 (CO Q 10) 100 MG CAPS Take 100 mg by mouth daily.  . colchicine 0.6 MG tablet Take 0.6 mg by mouth 3 (three) times a week.  Marland Kitchen DHEA 50 MG TABS Take 50 mg by mouth daily.  . digoxin (LANOXIN) 0.125 MG tablet TAKE 1 TABLET BY MOUTH EVERY MONDAY, WEDNESDAY, AND FRIDAY.  . furosemide (LASIX) 80 MG tablet Take 1 tablet (80 mg total) by mouth daily. (  Patient taking differently: Take 80 mg by mouth 2 (two) times daily.)  . Lactobacillus (ACIDOPHILUS/BIFIDUS PO) Take 100 mg by mouth daily.  . Loperamide HCl (IMODIUM PO) Take 1 tablet by mouth daily as needed (loose stools).   . magnesium gluconate (MAGONATE) 500 MG tablet Take 500 mg by mouth daily.  . metoprolol succinate (TOPROL-XL) 50 MG 24 hr tablet TAKE 2 TABLETS BY MOUTH 2 TIMES DAILY. TAKE WITH OR IMMEDIATELY FOLLOWING A MEAL.  Marland Kitchen Misc Natural Products (TART CHERRY ADVANCED) CAPS Take 2 capsules by mouth daily.  . Multiple Vitamin (MULTIVITAMIN WITH MINERALS) TABS Take 2 tablets by mouth daily.  . mycophenolate (CELLCEPT) 500 MG tablet Take 500 mg by mouth daily.  Marland Kitchen OLIVE LEAF EXTRACT PO Take 1 tablet by mouth  daily.  Marland Kitchen olmesartan (BENICAR) 40 MG tablet TAKE 1 TABLET BY MOUTH DAILY. *STOP OLMESARTAN/HCTZ*  . OVER THE COUNTER MEDICATION Take 1 capsule by mouth daily. Berry extract  . OVER THE COUNTER MEDICATION Take 1 capsule by mouth daily. Vitamin d 5000 units w/ iodine 1077mcg  . Pomegranate, Punica granatum, (POMEGRANATE PO) Take 250 mg by mouth daily.  . Saw Palmetto, Serenoa repens, (SAW PALMETTO PO) Take 500 mg by mouth daily.  Marland Kitchen triamcinolone (NASACORT) 55 MCG/ACT nasal inhaler Place 1 spray into both nostrils daily as needed (allergies).  Marland Kitchen VITAMIN E PO Take 360 mg by mouth daily.  Marland Kitchen warfarin (COUMADIN) 5 MG tablet TAKE AS DIRECTED BY COUMADIN CLINIC     Allergies:   Levaquin [levofloxacin in d5w] and Lisinopril   Social History   Socioeconomic History  . Marital status: Widowed    Spouse name: Not on file  . Number of children: Not on file  . Years of education: Not on file  . Highest education level: Not on file  Occupational History  . Not on file  Tobacco Use  . Smoking status: Never Smoker  . Smokeless tobacco: Never Used  Substance and Sexual Activity  . Alcohol use: No  . Drug use: No  . Sexual activity: Not on file  Other Topics Concern  . Not on file  Social History Narrative  . Not on file   Social Determinants of Health   Financial Resource Strain: Not on file  Food Insecurity: Not on file  Transportation Needs: Not on file  Physical Activity: Not on file  Stress: Not on file  Social Connections: Not on file     Family History: The patient's family history includes Heart disease in his father and mother.  ROS:   Please see the history of present illness.    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  All other systems reviewed and are negative.  EKGs/Labs/Other Studies Reviewed:    The following studies were reviewed today: No new data  EKG:  EKG not  repeated  Recent Labs: 10/20/2019: ALT 17; BUN 62; Creatinine 2.81; Hemoglobin 15.3; Platelets 171; Potassium 4.3; Sodium 140  Recent Lipid Panel No results found for: CHOL, TRIG, HDL, CHOLHDL, VLDL, LDLCALC, LDLDIRECT  Physical Exam:    VS:  BP (!) 80/50 (BP Location: Left Arm, Patient Position: Sitting, Cuff Size: Normal)   Pulse 80   Ht 5\' 11"  (1.803 m)   Wt 296 lb (134.3 kg)   SpO2 96%   BMI 41.28 kg/m     Wt Readings from Last 3 Encounters:  01/27/20 296 lb (134.3 kg)  10/20/19 Marland Kitchen)  303 lb 6.4 oz (137.6 kg)  07/23/19 (!) 312 lb 9.6 oz (141.8 kg)     GEN: Obese.  20 pound weight loss over the past year. No acute distress HEENT: Normal NECK: No JVD. LYMPHATICS: No lymphadenopathy CARDIAC: No murmur. IIRR no gallop, but with 1+ ankle edema which is markedly improved compared with last exam several months ago prior to increasing furosemide dose to 160 mg daily.Marland Kitchen VASCULAR:  Normal Pulses. No bruits. RESPIRATORY:  Clear to auscultation without rales, wheezing or rhonchi  ABDOMEN: Soft, non-tender, non-distended, No pulsatile mass, MUSCULOSKELETAL: No deformity  SKIN: Warm and dry NEUROLOGIC:  Alert and oriented x 3 PSYCHIATRIC:  Normal affect   ASSESSMENT:    1. Atrial fibrillation, persistent ([place])   2. Chronic kidney disease, unspecified CKD stage   3. Chronic diastolic heart failure ([place])   4. Essential hypertension   5. Educated about COVID-19 virus infection   6. Other hyperlipidemia   7. Long-term use of high-risk medication    PLAN:    In order of problems listed above:  1. Rate control is reasonable.  Continue Lanoxin 0.125 Monday, Wednesday, and Friday, Toprol-XL 100 mg twice daily, and Coumadin to prevent stroke. 2. Stage IV chronic kidney disease.  Nephrology increased furosemide to 80 mg twice per day.  Lower extremity swelling has significantly improved.  Blood pressure is relatively low but tolerated. 3. No evidence of volume overload. 4. Low blood  pressure related to his medication regimen which includes 160 mg of furosemide daily and 200 mg of Toprol-XL per day.  He is also on Benicar.  We may need to stop Benicar especially if kidney function seems to be worsening.  Last creatinine was 2.4 in December 2021.  This is being followed by Dr. Osborne Casco at [facility] 5. COVID-19 vaccinations have been received. 6. No new data.  No current therapy. 7. Coumadin therapy.  He is followed in the Coumadin clinic.  Overall relatively stable.  Blood pressures are relatively low.  More aggressive diuresis by Dr. Osborne Casco has helped improve volume status.  There is much less lower extremity edema.   Medication Adjustments/Labs and Tests Ordered: Current medicines are reviewed at length with the patient today.  Concerns regarding medicines are outlined above.  No orders of the defined types were placed in this encounter.  No orders of the defined types were placed in this encounter.   Patient Instructions  Medication Instructions:  Your physician recommends that you continue on your current medications as directed. Please refer to the Current Medication list given to you today.  *If you need a refill on your cardiac medications before your next appointment, please call your pharmacy*   Lab Work: None If you have labs (blood work) drawn today and your tests are completely normal, you will receive your results only by: Marland Kitchen MyChart Message (if you have MyChart) OR . A paper copy in the mail If you have any lab test that is abnormal or we need to change your treatment, we will call you to review the results.   Testing/Procedures: None   Follow-Up: At [facility], you and your health needs are our priority.  As part of our continuing mission to provide you with exceptional heart care, we have created designated Provider Care Teams.  These Care Teams include your primary Cardiologist (physician) and Advanced Practice Providers  (APPs -  Physician Assistants and Nurse Practitioners) who all work together to provide you with the care you need, when you  need it.  We recommend signing up for the patient portal called "MyChart".  Sign up information is provided on this After Visit Summary.  MyChart is used to connect with patients for Virtual Visits (Telemedicine).  Patients are able to view lab/test results, encounter notes, upcoming appointments, etc.  Non-urgent messages can be sent to your provider as well.   To learn more about what you can do with MyChart, go to [email or web address].    Your next appointment:   6 month(s)  The format for your next appointment:   In Person  Provider:   You may see Sinclair Grooms, MD or one of the following Advanced Practice Providers on your designated Care Team:    Kathyrn Drown, NP    Other Instructions      Signed, Sinclair Grooms, MD  01/27/2020 5:14 PM    Harry Foster

## 2020-01-27 ENCOUNTER — Ambulatory Visit (INDEPENDENT_AMBULATORY_CARE_PROVIDER_SITE_OTHER): Payer: Medicare Other | Admitting: Interventional Cardiology

## 2020-01-27 ENCOUNTER — Ambulatory Visit (INDEPENDENT_AMBULATORY_CARE_PROVIDER_SITE_OTHER): Payer: Medicare Other | Admitting: Pharmacist

## 2020-01-27 ENCOUNTER — Other Ambulatory Visit: Payer: Self-pay

## 2020-01-27 ENCOUNTER — Encounter: Payer: Self-pay | Admitting: Interventional Cardiology

## 2020-01-27 VITALS — BP 80/50 | HR 80 | Ht 71.0 in | Wt 296.0 lb

## 2020-01-27 DIAGNOSIS — Z5181 Encounter for therapeutic drug level monitoring: Secondary | ICD-10-CM

## 2020-01-27 DIAGNOSIS — Z7189 Other specified counseling: Secondary | ICD-10-CM | POA: Diagnosis not present

## 2020-01-27 DIAGNOSIS — I5032 Chronic diastolic (congestive) heart failure: Secondary | ICD-10-CM | POA: Diagnosis not present

## 2020-01-27 DIAGNOSIS — E7849 Other hyperlipidemia: Secondary | ICD-10-CM | POA: Diagnosis not present

## 2020-01-27 DIAGNOSIS — I4819 Other persistent atrial fibrillation: Secondary | ICD-10-CM

## 2020-01-27 DIAGNOSIS — Z79899 Other long term (current) drug therapy: Secondary | ICD-10-CM | POA: Diagnosis not present

## 2020-01-27 DIAGNOSIS — I10 Essential (primary) hypertension: Secondary | ICD-10-CM

## 2020-01-27 DIAGNOSIS — N1832 Chronic kidney disease, stage 3b: Secondary | ICD-10-CM

## 2020-01-27 DIAGNOSIS — N189 Chronic kidney disease, unspecified: Secondary | ICD-10-CM

## 2020-01-27 LAB — POCT INR: INR: 2.3 (ref 2.0–3.0)

## 2020-01-27 NOTE — Patient Instructions (Signed)
Contine taking Warfarin 1 pill daily except for 2 pills on Fridays. Recheck INR in 5 weeks. Call Coumadin Clinic for any questions/updates at [phone].

## 2020-01-27 NOTE — Patient Instructions (Signed)

## 2020-01-28 DIAGNOSIS — M1A9XX1 Chronic gout, unspecified, with tophus (tophi): Secondary | ICD-10-CM | POA: Diagnosis not present

## 2020-02-08 DIAGNOSIS — I509 Heart failure, unspecified: Secondary | ICD-10-CM | POA: Diagnosis not present

## 2020-02-08 DIAGNOSIS — K047 Periapical abscess without sinus: Secondary | ICD-10-CM | POA: Diagnosis not present

## 2020-02-08 DIAGNOSIS — Z79899 Other long term (current) drug therapy: Secondary | ICD-10-CM | POA: Diagnosis not present

## 2020-02-08 DIAGNOSIS — M2550 Pain in unspecified joint: Secondary | ICD-10-CM | POA: Diagnosis not present

## 2020-02-08 DIAGNOSIS — M79643 Pain in unspecified hand: Secondary | ICD-10-CM | POA: Diagnosis not present

## 2020-02-08 DIAGNOSIS — I4891 Unspecified atrial fibrillation: Secondary | ICD-10-CM | POA: Diagnosis not present

## 2020-02-08 DIAGNOSIS — M7989 Other specified soft tissue disorders: Secondary | ICD-10-CM | POA: Diagnosis not present

## 2020-02-08 DIAGNOSIS — N1831 Chronic kidney disease, stage 3a: Secondary | ICD-10-CM | POA: Diagnosis not present

## 2020-02-08 DIAGNOSIS — M1A9XX1 Chronic gout, unspecified, with tophus (tophi): Secondary | ICD-10-CM | POA: Diagnosis not present

## 2020-02-09 ENCOUNTER — Telehealth: Payer: Self-pay | Admitting: *Deleted

## 2020-02-09 NOTE — Telephone Encounter (Signed)
Pt called and stated he will have to have an extraction in the near future and it may be as early as 02/16/2020. He states he has a bad tooth and has been dealing with it for weeks and he has a consult with a dentist on 02/16/20 and he is unsure if they will pull the tooth or wait. Advised that if it is a normal and simple extraction he would not have to hold his Warfarin but if the Dentist thinks he will need to hold his Warfarin then we would need to know so we can get approval from Dr. Tamala Franklin to hold the Warfarin.Also, advised if the has to hold the Warfarin we need to know and he will need an appt within 7-10days after holding to have INR checked. Pt verbalized understanding and was thankful for all the information; encouraged to call back with any questions regarding this.  Also, the pt states his rheumatologist gave him Ampicillin 50mg  twice a day for 5 days for his abscess tooth, advised it does not interact with Warfarin and he should continue as instructed for his infection.

## 2020-02-11 DIAGNOSIS — M1A9XX1 Chronic gout, unspecified, with tophus (tophi): Secondary | ICD-10-CM | POA: Diagnosis not present

## 2020-02-24 ENCOUNTER — Telehealth: Payer: Self-pay | Admitting: Hematology

## 2020-02-24 NOTE — Telephone Encounter (Signed)
Called patient regarding upcoming March appointments, patient is notified. 

## 2020-02-25 DIAGNOSIS — M1A9XX1 Chronic gout, unspecified, with tophus (tophi): Secondary | ICD-10-CM | POA: Diagnosis not present

## 2020-03-02 ENCOUNTER — Inpatient Hospital Stay: Payer: Medicare Other | Attending: Hematology

## 2020-03-02 ENCOUNTER — Other Ambulatory Visit: Payer: Self-pay

## 2020-03-02 ENCOUNTER — Other Ambulatory Visit: Payer: Medicare Other

## 2020-03-02 DIAGNOSIS — D472 Monoclonal gammopathy: Secondary | ICD-10-CM | POA: Diagnosis not present

## 2020-03-02 DIAGNOSIS — N189 Chronic kidney disease, unspecified: Secondary | ICD-10-CM | POA: Insufficient documentation

## 2020-03-02 DIAGNOSIS — I129 Hypertensive chronic kidney disease with stage 1 through stage 4 chronic kidney disease, or unspecified chronic kidney disease: Secondary | ICD-10-CM | POA: Insufficient documentation

## 2020-03-02 LAB — CBC WITH DIFFERENTIAL/PLATELET
Abs Immature Granulocytes: 0.01 10*3/uL (ref 0.00–0.07)
Basophils Absolute: 0.1 10*3/uL (ref 0.0–0.1)
Basophils Relative: 1 %
Eosinophils Absolute: 0.1 10*3/uL (ref 0.0–0.5)
Eosinophils Relative: 1 %
HCT: 40.1 % (ref 39.0–52.0)
Hemoglobin: 12.8 g/dL — ABNORMAL LOW (ref 13.0–17.0)
Immature Granulocytes: 0 %
Lymphocytes Relative: 24 %
Lymphs Abs: 1.2 10*3/uL (ref 0.7–4.0)
MCH: 31.8 pg (ref 26.0–34.0)
MCHC: 31.9 g/dL (ref 30.0–36.0)
MCV: 99.5 fL (ref 80.0–100.0)
Monocytes Absolute: 0.6 10*3/uL (ref 0.1–1.0)
Monocytes Relative: 12 %
Neutro Abs: 3.2 10*3/uL (ref 1.7–7.7)
Neutrophils Relative %: 62 %
Platelets: 196 10*3/uL (ref 150–400)
RBC: 4.03 MIL/uL — ABNORMAL LOW (ref 4.22–5.81)
RDW: 12.8 % (ref 11.5–15.5)
WBC: 5.2 10*3/uL (ref 4.0–10.5)
nRBC: 0 % (ref 0.0–0.2)

## 2020-03-02 LAB — CMP (CANCER CENTER ONLY)
ALT: 16 U/L (ref 0–44)
AST: 15 U/L (ref 15–41)
Albumin: 3.6 g/dL (ref 3.5–5.0)
Alkaline Phosphatase: 68 U/L (ref 38–126)
Anion gap: 8 (ref 5–15)
BUN: 54 mg/dL — ABNORMAL HIGH (ref 8–23)
CO2: 24 mmol/L (ref 22–32)
Calcium: 9.4 mg/dL (ref 8.9–10.3)
Chloride: 107 mmol/L (ref 98–111)
Creatinine: 2.42 mg/dL — ABNORMAL HIGH (ref 0.61–1.24)
GFR, Estimated: 27 mL/min — ABNORMAL LOW (ref >60)
Glucose, Bld: 106 mg/dL — ABNORMAL HIGH (ref 70–99)
Potassium: 4.3 mmol/L (ref 3.5–5.1)
Sodium: 139 mmol/L (ref 135–145)
Total Bilirubin: 0.6 mg/dL (ref 0.3–1.2)
Total Protein: 6.2 g/dL — ABNORMAL LOW (ref 6.5–8.1)

## 2020-03-03 ENCOUNTER — Ambulatory Visit: Payer: Medicare Other

## 2020-03-03 DIAGNOSIS — Z5181 Encounter for therapeutic drug level monitoring: Secondary | ICD-10-CM

## 2020-03-03 DIAGNOSIS — I4819 Other persistent atrial fibrillation: Secondary | ICD-10-CM

## 2020-03-03 LAB — POCT INR: INR: 2.2 (ref 2.0–3.0)

## 2020-03-03 LAB — KAPPA/LAMBDA LIGHT CHAINS
Kappa free light chain: 25.2 mg/L — ABNORMAL HIGH (ref 3.3–19.4)
Kappa, lambda light chain ratio: 0.68 (ref 0.26–1.65)
Lambda free light chains: 36.8 mg/L — ABNORMAL HIGH (ref 5.7–26.3)

## 2020-03-03 NOTE — Patient Instructions (Addendum)
Description   Continue on same dosage of Warfarin 1 tablet daily except for 2 tablets on Fridays. Recheck INR in 6 weeks. Call Coumadin Clinic for any questions/updates at [phone].

## 2020-03-05 LAB — MULTIPLE MYELOMA PANEL, SERUM
Albumin SerPl Elph-Mcnc: 3.5 g/dL (ref 2.9–4.4)
Albumin/Glob SerPl: 1.7 (ref 0.7–1.7)
Alpha 1: 0.2 g/dL (ref 0.0–0.4)
Alpha2 Glob SerPl Elph-Mcnc: 0.6 g/dL (ref 0.4–1.0)
B-Globulin SerPl Elph-Mcnc: 0.7 g/dL (ref 0.7–1.3)
Gamma Glob SerPl Elph-Mcnc: 0.7 g/dL (ref 0.4–1.8)
Globulin, Total: 2.1 g/dL — ABNORMAL LOW (ref 2.2–3.9)
IgA: 55 mg/dL — ABNORMAL LOW (ref 61–437)
IgG (Immunoglobin G), Serum: 860 mg/dL (ref 603–1613)
IgM (Immunoglobulin M), Srm: 35 mg/dL (ref 15–143)
M Protein SerPl Elph-Mcnc: 0.4 g/dL — ABNORMAL HIGH
Total Protein ELP: 5.6 g/dL — ABNORMAL LOW (ref 6.0–8.5)

## 2020-03-08 NOTE — Progress Notes (Signed)
HEMATOLOGY/ONCOLOGY CLINIC NOTE  Date of Service: 03/08/2020  Patient Care Team: Josetta Huddle, MD as PCP - General (Internal Medicine) Belva Crome, MD as PCP - Cardiology (Cardiology)  CHIEF COMPLAINTS/PURPOSE OF CONSULTATION:  Elevated M Spike  HISTORY OF PRESENTING ILLNESS:   Harry Foster is a wonderful 75 y.o. male who has been referred to [place] by Dr. Joylene Grapes for evaluation and management of M Spike. The pt reports that he is doing well overall.   The pt reports that he has Afib, which is being managed with Warfarin by Dr. Daneen Schick. His HTN has been under control. Pt is being followed by Dr. Kathlene November for Gout which is not well-controlled. Pt reports finger stiffness and mobility challenges in the last 15 months. Pt has been avoiding sugary liquids and red meats. His uric acid is currently greater than 11. He is considering starting Pegloticase to control his Gout. Pt has been dealing with renal dysfunction for the last 10 years.   Most recent lab results (09/23/2019) of CBC w/diff & CMP is as follows: all values are WNL except for BUN at 48, Creatinine at 2.40, GFR Est Non Af Am at 26. 09/23/2019 SPEP shows all values are WNL except for M Spike at 0.8 09/23/2019 Kappa light chains at 34.4, Lambda light chans at 42.8, K/L light chain ratio at 0.8  On review of systems, pt reports joint pain/stiffness and denies new bone pain, abdominal pain, constipation, dysuria and any other symptoms.   On PMHx the pt reports Arthritis, Atrial fibrillation, HTN, Gout, Cardioversion, Stage 3b CKD.  On Family Hx the pt reports that his mother had breast and cervical cancer in her 36's and his sister had breast cancer in her 89-50's.   INTERVAL HISTORY:  Harry Foster is a wonderful 75 y.o. male who is here for evaluation and management of M Spike. The patient's last visit with [place] was on 11/10/2019. The pt reports that he is doing well overall.  The pt reports that he feels better following  his recent surgery where they removed some teeth that were causing him pain. The pt notes he was started on Krystexxa two months ago for control of his gout. The pt notes this has improved very much and he can close his left hand completely now. He is getting this once every other week.  Lab results today 03/03/2020 of CBC w/diff and CMP is as follows: all values are WNL except for RBC of 4.03, Hgb of 12.8, Glucose of 106, BUN of 54, Creatinine of 2.42, Total Protein of 6.2, GFR est of 27. 03/03/2020 MMP WNL except IgA of 55, Total Protein of 5.6, m protein of 0.4, Glubulin Total of 2.1. 03/03/2020 Kappa light chain of 25.2, Lambda light chain of 36.8. 03/03/2020 POCT INR of 2.2.  On review of systems, pt denies new bone pains, back pain, abdominal pain, worsening gout, acute swelling, SOB, and any other symptoms  MEDICAL HISTORY:  Past Medical History:  Diagnosis Date  . Arthritis    both knees and hands and left elbow, hip  . Depression    treated for depression when wife was very ill  . Diverticulosis    hx - no sx  . Dysrhythmia    hx A fib, on anticoagulants  . GI bleed 2010  . GI bleed    hx  . Hypertension   . Recurrent upper respiratory infection (URI)    completing treatment for sinus infection - with abx  SURGICAL HISTORY: Past Surgical History:  Procedure Laterality Date  . CARDIOVERSION  02/01/2011   Procedure: CARDIOVERSION;  Surgeon: Sinclair Grooms, MD;  Location: [place];  Service: Cardiovascular;  Laterality: N/A;  . SINUS EXPLORATION  1992   laser sinus surgery  . TONSILLECTOMY     age 8  . TOTAL KNEE ARTHROPLASTY  10/09/2011   Procedure: TOTAL KNEE ARTHROPLASTY;  Surgeon: Hessie Dibble, MD;  Location: [place];  Service: Orthopedics;  Laterality: Left;  left total knee arthroplasty  . TOTAL KNEE ARTHROPLASTY Right 10/06/2013   DR DALLDORF  . TOTAL KNEE ARTHROPLASTY WITH REVISION COMPONENTS Right 10/06/2013   Procedure: TOTAL KNEE ARTHROPLASTY WITH REVISION  COMPONENTS;  Surgeon: Hessie Dibble, MD;  Location: [place];  Service: Orthopedics;  Laterality: Right;  . TREATMENT FISTULA ANAL  1992    SOCIAL HISTORY: Social History   Socioeconomic History  . Marital status: Widowed    Spouse name: Not on file  . Number of children: Not on file  . Years of education: Not on file  . Highest education level: Not on file  Occupational History  . Not on file  Tobacco Use  . Smoking status: Never Smoker  . Smokeless tobacco: Never Used  Substance and Sexual Activity  . Alcohol use: No  . Drug use: No  . Sexual activity: Not on file  Other Topics Concern  . Not on file  Social History Narrative  . Not on file   Social Determinants of Health   Financial Resource Strain: Not on file  Food Insecurity: Not on file  Transportation Needs: Not on file  Physical Activity: Not on file  Stress: Not on file  Social Connections: Not on file  Intimate Partner Violence: Not on file    FAMILY HISTORY: Family History  Problem Relation Age of Onset  . Heart disease Mother   . Heart disease Father     ALLERGIES:  is allergic to levaquin [levofloxacin in d5w] and lisinopril.  MEDICATIONS:  Current Outpatient Medications  Medication Sig Dispense Refill  . ascorbic acid (VITAMIN C) 1000 MG tablet Take 1,000 mg by mouth daily.    Marland Kitchen aspirin 81 MG EC tablet Take 81 mg by mouth daily. Swallow whole.    . Coenzyme Q10 (CO Q 10) 100 MG CAPS Take 100 mg by mouth daily.    . colchicine 0.6 MG tablet Take 0.6 mg by mouth 3 (three) times a week.    Marland Kitchen DHEA 50 MG TABS Take 50 mg by mouth daily.    . digoxin (LANOXIN) 0.125 MG tablet TAKE 1 TABLET BY MOUTH EVERY MONDAY, WEDNESDAY, AND FRIDAY. 45 tablet 2  . furosemide (LASIX) 80 MG tablet Take 1 tablet (80 mg total) by mouth daily. (Patient taking differently: Take 80 mg by mouth 2 (two) times daily.) 90 tablet 3  . Lactobacillus (ACIDOPHILUS/BIFIDUS PO) Take 100 mg by mouth daily.    . Loperamide HCl  (IMODIUM PO) Take 1 tablet by mouth daily as needed (loose stools).     . magnesium gluconate (MAGONATE) 500 MG tablet Take 500 mg by mouth daily.    . metoprolol succinate (TOPROL-XL) 50 MG 24 hr tablet TAKE 2 TABLETS BY MOUTH 2 TIMES DAILY. TAKE WITH OR IMMEDIATELY FOLLOWING A MEAL. 360 tablet 2  . Misc Natural Products (TART CHERRY ADVANCED) CAPS Take 2 capsules by mouth daily.    . Multiple Vitamin (MULTIVITAMIN WITH MINERALS) TABS Take 2 tablets by mouth daily.    Marland Kitchen  mycophenolate (CELLCEPT) 500 MG tablet Take 500 mg by mouth daily.    Marland Kitchen OLIVE LEAF EXTRACT PO Take 1 tablet by mouth daily.    Marland Kitchen olmesartan (BENICAR) 40 MG tablet TAKE 1 TABLET BY MOUTH DAILY. *STOP OLMESARTAN/HCTZ* 90 tablet 2  . OVER THE COUNTER MEDICATION Take 1 capsule by mouth daily. Berry extract    . OVER THE COUNTER MEDICATION Take 1 capsule by mouth daily. Vitamin d 5000 units w/ iodine 1069mcg    . Pomegranate, Punica granatum, (POMEGRANATE PO) Take 250 mg by mouth daily.    . Saw Palmetto, Serenoa repens, (SAW PALMETTO PO) Take 500 mg by mouth daily.    Marland Kitchen triamcinolone (NASACORT) 55 MCG/ACT nasal inhaler Place 1 spray into both nostrils daily as needed (allergies).    Marland Kitchen VITAMIN E PO Take 360 mg by mouth daily.    Marland Kitchen warfarin (COUMADIN) 5 MG tablet TAKE AS DIRECTED BY COUMADIN CLINIC 40 tablet 2   No current facility-administered medications for this visit.    REVIEW OF SYSTEMS:   10 Point review of Systems was done is negative except as noted above.  PHYSICAL EXAMINATION: ECOG PERFORMANCE STATUS: 1 - Symptomatic but completely ambulatory  Exam was given in a chair. .BP 92/67 (BP Location: Left Arm, Patient Position: Sitting)   Pulse 93   Temp 98.1 F (36.7 C) (Tympanic)   Resp 18   Ht 5\' 11"  (1.803 m)   Wt 294 lb 11.2 oz (133.7 kg)   SpO2 97%   BMI 41.10 kg/m   GENERAL:alert, in no acute distress and comfortable SKIN: no acute rashes, no significant lesions EYES: conjunctiva are pink and non-injected,  sclera anicteric OROPHARYNX: MMM, no exudates, no oropharyngeal erythema or ulceration NECK: supple, no JVD LYMPH:  no palpable lymphadenopathy in the cervical, axillary or inguinal regions LUNGS: clear to auscultation b/l with normal respiratory effort HEART: regular rate & rhythm ABDOMEN:  normoactive bowel sounds , non tender, not distended. Extremity: no pedal edema PSYCH: alert & oriented x 3 with fluent speech NEURO: no focal motor/sensory deficits   LABORATORY DATA:  I have reviewed the data as listed  CBC Latest Ref Rng & Units 03/02/2020 10/20/2019 10/08/2013  WBC 4.0 - 10.5 K/uL 5.2 5.7 7.8  Hemoglobin 13.0 - 17.0 g/dL 12.8(L) 15.3 13.2  Hematocrit 39.0 - 52.0 % 40.1 46.1 40.2  Platelets 150 - 400 K/uL 196 171 162    CMP Latest Ref Rng & Units 03/02/2020 10/20/2019 07/29/2019  Glucose 70 - 99 mg/dL 106(H) 105(H) 99  BUN 8 - 23 mg/dL 54(H) 62(H) 47(H)  Creatinine 0.61 - 1.24 mg/dL 2.42(H) 2.81(H) 2.25(H)  Sodium 135 - 145 mmol/L 139 140 142  Potassium 3.5 - 5.1 mmol/L 4.3 4.3 4.5  Chloride 98 - 111 mmol/L 107 103 104  CO2 22 - 32 mmol/L 24 29 22   Calcium 8.9 - 10.3 mg/dL 9.4 10.6(H) 9.8  Total Protein 6.5 - 8.1 g/dL 6.2(L) 7.1 -  Total Bilirubin 0.3 - 1.2 mg/dL 0.6 0.6 -  Alkaline Phos 38 - 126 U/L 68 76 -  AST 15 - 41 U/L 15 22 -  ALT 0 - 44 U/L 16 17 -     RADIOGRAPHIC STUDIES: I have personally reviewed the radiological images as listed and agreed with the findings in the report. No results found.  ASSESSMENT & PLAN:   75 yo with   1) Monoclonal paraproteinemia 2) CKD - likely from HTN, obesity less likely from plasma cell dyscrasia.  PLAN: -Discussed  pt labwork, 03/03/2020; m-protein improved, Light Chain ratio WNL, blood counts decreased but still normal, chemistries stable given chronic kidney disease. -Advised pt that abnormal antibody response was partially due to inflammatory response due to gout. This is reason behind pt's decrease from recent labs.   -Recommended pt f/u w PCP regarding bone density study. The pt wishes for [place] to order this. Advised pt he has multiple risk fractures for bone loss.  -Recommended pt get enough calcium and Vitamin D.  -Will see back in 6 months with labs one week prior. Will get bone density study prior in 4 months.    FOLLOW UP: DEXA scan in 4 months RTC with Dr Irene Limbo with labs in 6 months. Plz schedule the lab appointment 1 week prior to clinic visit   The total time spent in the appointment was 20 minutes and more than 50% was on counseling and direct patient cares.   All of the patient's questions were answered with apparent satisfaction. The patient knows to call the clinic with any problems, questions or concerns.    Sullivan Lone MD [place] AAHIVMS [facility] Hematology/Oncology Physician [facility]  (Office):       [phone] (Work cell):  [phone] (Fax):           [phone]  03/08/2020 1:57 PM  I, Reinaldo Raddle, am acting as scribe for Dr. Sullivan Lone, MD.    .I have reviewed the above documentation for accuracy and completeness, and I agree with the above. Brunetta Genera MD

## 2020-03-09 ENCOUNTER — Inpatient Hospital Stay (HOSPITAL_BASED_OUTPATIENT_CLINIC_OR_DEPARTMENT_OTHER): Payer: Medicare Other | Admitting: Hematology

## 2020-03-09 ENCOUNTER — Telehealth: Payer: Self-pay | Admitting: Hematology

## 2020-03-09 ENCOUNTER — Other Ambulatory Visit: Payer: Self-pay

## 2020-03-09 VITALS — BP 92/67 | HR 93 | Temp 98.1°F | Resp 18 | Ht 71.0 in | Wt 294.7 lb

## 2020-03-09 DIAGNOSIS — N189 Chronic kidney disease, unspecified: Secondary | ICD-10-CM | POA: Diagnosis not present

## 2020-03-09 DIAGNOSIS — D472 Monoclonal gammopathy: Secondary | ICD-10-CM | POA: Diagnosis not present

## 2020-03-09 DIAGNOSIS — M1A9XX1 Chronic gout, unspecified, with tophus (tophi): Secondary | ICD-10-CM | POA: Diagnosis not present

## 2020-03-09 DIAGNOSIS — I129 Hypertensive chronic kidney disease with stage 1 through stage 4 chronic kidney disease, or unspecified chronic kidney disease: Secondary | ICD-10-CM | POA: Diagnosis not present

## 2020-03-09 NOTE — Telephone Encounter (Signed)
Scheduled appt per 3/9 LOS - gave pt avs and calender

## 2020-03-10 DIAGNOSIS — M1A9XX1 Chronic gout, unspecified, with tophus (tophi): Secondary | ICD-10-CM | POA: Diagnosis not present

## 2020-03-21 DIAGNOSIS — L570 Actinic keratosis: Secondary | ICD-10-CM | POA: Diagnosis not present

## 2020-03-21 DIAGNOSIS — Z85828 Personal history of other malignant neoplasm of skin: Secondary | ICD-10-CM | POA: Diagnosis not present

## 2020-03-21 DIAGNOSIS — L814 Other melanin hyperpigmentation: Secondary | ICD-10-CM | POA: Diagnosis not present

## 2020-03-21 DIAGNOSIS — D225 Melanocytic nevi of trunk: Secondary | ICD-10-CM | POA: Diagnosis not present

## 2020-03-21 DIAGNOSIS — D2239 Melanocytic nevi of other parts of face: Secondary | ICD-10-CM | POA: Diagnosis not present

## 2020-03-21 DIAGNOSIS — L821 Other seborrheic keratosis: Secondary | ICD-10-CM | POA: Diagnosis not present

## 2020-03-21 DIAGNOSIS — D692 Other nonthrombocytopenic purpura: Secondary | ICD-10-CM | POA: Diagnosis not present

## 2020-03-21 DIAGNOSIS — D1801 Hemangioma of skin and subcutaneous tissue: Secondary | ICD-10-CM | POA: Diagnosis not present

## 2020-03-25 DIAGNOSIS — M1A9XX1 Chronic gout, unspecified, with tophus (tophi): Secondary | ICD-10-CM | POA: Diagnosis not present

## 2020-03-28 DIAGNOSIS — M1A9XX1 Chronic gout, unspecified, with tophus (tophi): Secondary | ICD-10-CM | POA: Diagnosis not present

## 2020-03-31 DIAGNOSIS — N1832 Chronic kidney disease, stage 3b: Secondary | ICD-10-CM | POA: Diagnosis not present

## 2020-03-31 DIAGNOSIS — I129 Hypertensive chronic kidney disease with stage 1 through stage 4 chronic kidney disease, or unspecified chronic kidney disease: Secondary | ICD-10-CM | POA: Diagnosis not present

## 2020-03-31 DIAGNOSIS — I480 Paroxysmal atrial fibrillation: Secondary | ICD-10-CM | POA: Diagnosis not present

## 2020-03-31 DIAGNOSIS — R600 Localized edema: Secondary | ICD-10-CM | POA: Diagnosis not present

## 2020-03-31 DIAGNOSIS — D472 Monoclonal gammopathy: Secondary | ICD-10-CM | POA: Diagnosis not present

## 2020-04-11 ENCOUNTER — Telehealth: Payer: Self-pay | Admitting: Interventional Cardiology

## 2020-04-11 NOTE — Telephone Encounter (Signed)
Pt states he regularly has issues with blisters forming and popping on his swollen legs.  Had 3 come up and pop recently.  Two healed fine but the third will not heal because it is consistently pouring water out of it.  Pt folds 3-4 paper towels into quarters and puts them in his sock.  They are soaked after 3 hours and he changes them out.  This has been happening since Saturday.  Denies that swelling is any worse than usual.  Nephrologist, Dr. Joylene Grapes, increased pt's Furosemide ot 80mg  BID 2-3 months ago.  Pt admits to eating [employer] a couple of times a week and [employer] salads multiple times a week.  States he lives alone and [employer], so take out is his only option for food.  Denies SOB.  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  Will route to Dr. Tamala Thrush for review.

## 2020-04-11 NOTE — Telephone Encounter (Signed)
Patient is calling to speak to Dr. Tamala Ovitt or nurse regarding edema in his legs

## 2020-04-12 ENCOUNTER — Other Ambulatory Visit: Payer: Self-pay | Admitting: Interventional Cardiology

## 2020-04-12 ENCOUNTER — Other Ambulatory Visit: Payer: Self-pay

## 2020-04-12 DIAGNOSIS — M1A9XX1 Chronic gout, unspecified, with tophus (tophi): Secondary | ICD-10-CM | POA: Diagnosis not present

## 2020-04-12 DIAGNOSIS — I4819 Other persistent atrial fibrillation: Secondary | ICD-10-CM

## 2020-04-12 MED ORDER — DIGOXIN 125 MCG PO TABS: ORAL_TABLET | ORAL | 2 refills | Status: DC

## 2020-04-12 NOTE — Telephone Encounter (Signed)
Pt is calling back in regards to not receiving a return call yesterday. Please advise

## 2020-04-12 NOTE — Telephone Encounter (Signed)
Called patient in regards to blisters on his leg.  Patient expresses that he is upset that no one called him back yesterday 04/11/20.  I asked him if there is something I can help him with.  He wants Dr. Tamala Hipolito to give him instructions on how to treat weeping blisters.  I advised him to reach out to his PCP for assistance.  In which he replied it would be useless.  I then suggested that he be evaluated at a wound clinic.  He also does not want to be seen at a wound clinic.  I then advised him to apply guaze over blister and wrap leg with ace bandage.  He also does not want to do this.  He expressed that folded paper towels isn't enough for weeping blisters, so guaze will not help.  I then advised him to go to pharmacy and see what they suggest for weeping blisters.  He expressed that he would see what the pharmacy could recommend.  He then expressed that he takes furosemide 80 mg PO BID.  The first dose of the day sends him to the bathroom. However, the second dose doesn't really help him.  I advised him to reach out to his nephrologist to see if medication needs to be adjusted.  He would prefer his heart doctor to address concerns.  I let him know that I would route his concerns to Dr. Tamala Siglin and his nurse.

## 2020-04-13 DIAGNOSIS — M1A9XX1 Chronic gout, unspecified, with tophus (tophi): Secondary | ICD-10-CM | POA: Diagnosis not present

## 2020-04-13 NOTE — Telephone Encounter (Signed)
Spoke with Harry Foster and she was calling to get permission to fill pt's Digoxin under a different manufacturer.  Advised this will be fine as long as it's the same dose.  Harry Foster states it is the same dose, just a Risk analyst.  Harry Foster appreciative for call.

## 2020-04-14 ENCOUNTER — Ambulatory Visit (INDEPENDENT_AMBULATORY_CARE_PROVIDER_SITE_OTHER): Payer: Medicare Other | Admitting: Pharmacist

## 2020-04-14 ENCOUNTER — Other Ambulatory Visit: Payer: Self-pay

## 2020-04-14 DIAGNOSIS — Z5181 Encounter for therapeutic drug level monitoring: Secondary | ICD-10-CM | POA: Diagnosis not present

## 2020-04-14 DIAGNOSIS — I4819 Other persistent atrial fibrillation: Secondary | ICD-10-CM | POA: Diagnosis not present

## 2020-04-14 LAB — POCT INR: INR: 1.7 — AB (ref 2.0–3.0)

## 2020-04-14 NOTE — Telephone Encounter (Signed)
Spoke with Dr. Tamala Mearns and he said to tell pt to see his PCP and refer to wound clinic.

## 2020-04-14 NOTE — Telephone Encounter (Signed)
Pt in office for INR check, requesting an update from Dr Tamala Woodell regarding his wounds.

## 2020-04-14 NOTE — Telephone Encounter (Signed)
Spoke with pt and reviewed recommendations per Dr. Tamala Forgey.  Pt refused referral to wound clinic.  States he has an appt with PCP next Wednesday and will talk to him and let him treat the wound.  Advised to call back if he changes his mind and we can place the referral.

## 2020-04-14 NOTE — Patient Instructions (Signed)
Description   Take 1.5 tablets today, then continue on same dosage of Warfarin 1 tablet daily except for 2 tablets on Fridays. Recheck INR in 4 weeks. Call [facility] Clinic for any questions/updates at [phone].

## 2020-04-20 DIAGNOSIS — I10 Essential (primary) hypertension: Secondary | ICD-10-CM | POA: Diagnosis not present

## 2020-04-20 DIAGNOSIS — Z1389 Encounter for screening for other disorder: Secondary | ICD-10-CM | POA: Diagnosis not present

## 2020-04-20 DIAGNOSIS — Z0000 Encounter for general adult medical examination without abnormal findings: Secondary | ICD-10-CM | POA: Diagnosis not present

## 2020-04-20 DIAGNOSIS — D6869 Other thrombophilia: Secondary | ICD-10-CM | POA: Diagnosis not present

## 2020-04-20 DIAGNOSIS — I5020 Unspecified systolic (congestive) heart failure: Secondary | ICD-10-CM | POA: Diagnosis not present

## 2020-04-20 DIAGNOSIS — I4891 Unspecified atrial fibrillation: Secondary | ICD-10-CM | POA: Diagnosis not present

## 2020-04-20 DIAGNOSIS — N184 Chronic kidney disease, stage 4 (severe): Secondary | ICD-10-CM | POA: Diagnosis not present

## 2020-04-20 DIAGNOSIS — M7989 Other specified soft tissue disorders: Secondary | ICD-10-CM | POA: Diagnosis not present

## 2020-04-20 DIAGNOSIS — M1A9XX1 Chronic gout, unspecified, with tophus (tophi): Secondary | ICD-10-CM | POA: Diagnosis not present

## 2020-04-20 DIAGNOSIS — I83019 Varicose veins of right lower extremity with ulcer of unspecified site: Secondary | ICD-10-CM | POA: Diagnosis not present

## 2020-04-26 DIAGNOSIS — M1A9XX1 Chronic gout, unspecified, with tophus (tophi): Secondary | ICD-10-CM | POA: Diagnosis not present

## 2020-04-28 DIAGNOSIS — M1A9XX1 Chronic gout, unspecified, with tophus (tophi): Secondary | ICD-10-CM | POA: Diagnosis not present

## 2020-05-03 ENCOUNTER — Other Ambulatory Visit: Payer: Self-pay

## 2020-05-03 ENCOUNTER — Ambulatory Visit
Admission: RE | Admit: 2020-05-03 | Discharge: 2020-05-03 | Disposition: A | Discharge status: Home / Self Care | Payer: Medicare Other | Source: Ambulatory Visit | Attending: Hematology | Admitting: Hematology

## 2020-05-03 DIAGNOSIS — Z13820 Encounter for screening for osteoporosis: Secondary | ICD-10-CM | POA: Diagnosis not present

## 2020-05-03 DIAGNOSIS — D472 Monoclonal gammopathy: Secondary | ICD-10-CM

## 2020-05-03 DIAGNOSIS — Z0389 Encounter for observation for other suspected diseases and conditions ruled out: Secondary | ICD-10-CM | POA: Diagnosis not present

## 2020-05-09 DIAGNOSIS — I878 Other specified disorders of veins: Secondary | ICD-10-CM | POA: Diagnosis not present

## 2020-05-09 DIAGNOSIS — R600 Localized edema: Secondary | ICD-10-CM | POA: Diagnosis not present

## 2020-05-09 DIAGNOSIS — M79643 Pain in unspecified hand: Secondary | ICD-10-CM | POA: Diagnosis not present

## 2020-05-09 DIAGNOSIS — N1831 Chronic kidney disease, stage 3a: Secondary | ICD-10-CM | POA: Diagnosis not present

## 2020-05-09 DIAGNOSIS — I4891 Unspecified atrial fibrillation: Secondary | ICD-10-CM | POA: Diagnosis not present

## 2020-05-09 DIAGNOSIS — M2550 Pain in unspecified joint: Secondary | ICD-10-CM | POA: Diagnosis not present

## 2020-05-09 DIAGNOSIS — I509 Heart failure, unspecified: Secondary | ICD-10-CM | POA: Diagnosis not present

## 2020-05-09 DIAGNOSIS — M7989 Other specified soft tissue disorders: Secondary | ICD-10-CM | POA: Diagnosis not present

## 2020-05-09 DIAGNOSIS — M109 Gout, unspecified: Secondary | ICD-10-CM | POA: Diagnosis not present

## 2020-05-09 DIAGNOSIS — S81801A Unspecified open wound, right lower leg, initial encounter: Secondary | ICD-10-CM | POA: Diagnosis not present

## 2020-05-09 DIAGNOSIS — M1A9XX1 Chronic gout, unspecified, with tophus (tophi): Secondary | ICD-10-CM | POA: Diagnosis not present

## 2020-05-12 ENCOUNTER — Ambulatory Visit (INDEPENDENT_AMBULATORY_CARE_PROVIDER_SITE_OTHER): Payer: Medicare Other

## 2020-05-12 ENCOUNTER — Other Ambulatory Visit: Payer: Self-pay

## 2020-05-12 DIAGNOSIS — Z5181 Encounter for therapeutic drug level monitoring: Secondary | ICD-10-CM

## 2020-05-12 DIAGNOSIS — I4819 Other persistent atrial fibrillation: Secondary | ICD-10-CM

## 2020-05-12 LAB — POCT INR: INR: 1.9 — AB (ref 2.0–3.0)

## 2020-05-12 NOTE — Patient Instructions (Signed)
Description   Take 1.5 tablets today, then continue on same dosage of Warfarin 1 tablet daily except for 2 tablets on Fridays. Recheck INR in 4 weeks. Call Coumadin Clinic for any questions/updates at [phone].

## 2020-05-16 ENCOUNTER — Encounter (HOSPITAL_BASED_OUTPATIENT_CLINIC_OR_DEPARTMENT_OTHER): Payer: Medicare Other | Attending: Internal Medicine | Admitting: Internal Medicine

## 2020-05-16 ENCOUNTER — Other Ambulatory Visit: Payer: Self-pay

## 2020-05-16 DIAGNOSIS — L97829 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ity: Secondary | ICD-10-CM | POA: Diagnosis not present

## 2020-05-16 DIAGNOSIS — I504 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 failure: Secondary | ICD-10-CM | POA: Insufficient documentation

## 2020-05-16 DIAGNOSIS — L97819 Non-pressure chronic ulcer of other part of right lower leg with unspecified severity: Secondary | ICD-10-CM | POA: Diagnosis not present

## 2020-05-16 DIAGNOSIS — I130 Hypertensive heart and chronic kidney disease with heart failure and stage 1 through stage 4 chronic kidney disease, or unspecified chronic kidney disease: Secondary | ICD-10-CM | POA: Insufficient documentation

## 2020-05-16 DIAGNOSIS — Z888 Allergy status to other drugs, medicaments and biological substances status: Secondary | ICD-10-CM | POA: Diagnosis not present

## 2020-05-16 DIAGNOSIS — N184 Chronic kidney disease, stage 4 (severe): Secondary | ICD-10-CM | POA: Insufficient documentation

## 2020-05-16 DIAGNOSIS — M1A9XX1 Chronic gout, unspecified, with tophus (tophi): Secondary | ICD-10-CM | POA: Diagnosis not present

## 2020-05-16 DIAGNOSIS — I4819 Other persistent atrial fibrillation: Secondary | ICD-10-CM | POA: Insufficient documentation

## 2020-05-16 DIAGNOSIS — L97919 Non-pressure chronic ulcer of unspecified part of right lower leg with unspecified severity: Secondary | ICD-10-CM

## 2020-05-16 DIAGNOSIS — Z7901 Long term (current) use of anticoagulants: Secondary | ICD-10-CM | POA: Diagnosis not present

## 2020-05-16 DIAGNOSIS — I872 Venous insufficiency (chronic) (peripheral): Secondary | ICD-10-CM | POA: Insufficient documentation

## 2020-05-16 DIAGNOSIS — Z8249 Family history of ischemic heart disease and other diseases of the circulatory system: Secondary | ICD-10-CM | POA: Insufficient documentation

## 2020-05-16 NOTE — Progress Notes (Signed)
Harry Foster, Harry Foster ([id number]) Visit Report for 05/16/2020 Abuse/Suicide Risk Screen Details Patient Name: Date of Service: Harry Foster 05/16/2020 2:45 PM Medical Record Number: [id number] Patient Account Number: [id number] Date of Birth/Sex: Treating RN: 1945/07/14 (74 y.o. Male) Baruch Gouty Primary Care Sterling Ucci: [name] Other Clinician: Referring Eisa Conaway: Treating Aveya Beal/Extender: [name] in Treatment: 0 Abuse/Suicide Risk Screen Items Answer ABUSE RISK SCREEN: Has anyone close to you tried to hurt or harm you recentlyo No Do you feel uncomfortable with anyone in your familyo No Has anyone forced you do things that you didnt want to doo No Electronic Signature(s) Signed: 05/16/2020 4:50:33 PM By: Baruch Gouty RN, BSN Entered By: Baruch Gouty on 05/16/2020 15:37:20 -------------------------------------------------------------------------------- Activities of Daily Living Details Patient Name: Date of Service: Harry Foster 05/16/2020 2:45 PM Medical Record Number: [id number] Patient Account Number: [id number] Date of Birth/Sex: Treating RN: November 09, 1945 (74 y.o. Male) Baruch Gouty Primary Care Bellarae Lizer: [name] Other Clinician: Referring Jonuel Butterfield: Treating Darcella Shiffman/Extender: [name] in Treatment: 0 Activities of Daily Living Items Answer Activities of Daily Living (Please select one for each item) Drive Automobile Completely Able T Medications ake Completely Able Use T elephone Completely Able Care for Appearance Completely Able Use T oilet Completely Able Bath / Shower Completely Able Dress Self Completely Able Feed Self Completely Able Walk Completely Able Get In / Out Bed Completely Able Housework Completely Able Prepare Meals Completely [place] for Self Completely Able Electronic Signature(s) Signed: 05/16/2020  4:50:33 PM By: Baruch Gouty RN, BSN Entered By: Baruch Gouty on 05/16/2020 15:38:03 -------------------------------------------------------------------------------- Education Screening Details Patient Name: Date of Service: Harry Foster 05/16/2020 2:45 PM Medical Record Number: [id number] Patient Account Number: [id number] Date of Birth/Sex: Treating RN: 08-Nov-1945 (74 y.o. Male) Baruch Gouty Primary Care Rector Devonshire: [name] Other Clinician: Referring Mikayela Deats: Treating Shanyia Stines/Extender: [name] in Treatment: 0 Primary Learner Assessed: Patient Learning Preferences/Education Level/Primary Language Learning Preference: Explanation, Demonstration, Printed Material Highest Education Level: College or Above Preferred Language: English Cognitive Barrier Language Barrier: No Translator Needed: No Memory Deficit: No Emotional Barrier: No Cultural/Religious Beliefs Affecting Medical Care: No Physical Barrier Impaired Vision: Yes Glasses Impaired Hearing: No Decreased Hand dexterity: No Knowledge/Comprehension Knowledge Level: High Comprehension Level: High Ability to understand written instructions: High Ability to understand verbal instructions: High Motivation Anxiety Level: Calm Cooperation: Cooperative Education Importance: Acknowledges Need Interest in Health Problems: Asks Questions Perception: Coherent Willingness to Engage in Self-Management High Activities: Readiness to Engage in Self-Management High Activities: Electronic Signature(s) Signed: 05/16/2020 4:50:33 PM By: Baruch Gouty RN, BSN Entered By: Baruch Gouty on 05/16/2020 15:39:07 -------------------------------------------------------------------------------- Fall Risk Assessment Details Patient Name: Date of Service: Harry Foster 05/16/2020 2:45 PM Medical Record Number: [id number] Patient Account Number: [id number] Date of  Birth/Sex: Treating RN: 05/26/1945 (74 y.o. Male) Baruch Gouty Primary Care Shadow Stiggers: [name] Other Clinician: Referring Lalonnie Shaffer: Treating Isel Skufca/Extender: [name] in Treatment: 0 Fall Risk Assessment Items Have you had 2 or more falls in the last 12 monthso 0 No Have you had any fall that resulted in injury in the last 12 monthso 0 No FALLS RISK SCREEN History of falling - immediate or within 3 months 0 No Secondary diagnosis (Do you have 2 or more medical diagnoseso) 0 No Ambulatory aid None/bed rest/wheelchair/nurse 0 Yes Crutches/cane/walker 0 No Furniture 0 No Intravenous therapy Access/Saline/Heparin  Lock 0 No Gait/Transferring Normal/ bed rest/ wheelchair 0 Yes Weak (short steps with or without shuffle, stooped but able to lift head while walking, may seek 0 No support from furniture) Impaired (short steps with shuffle, may have difficulty arising from chair, head down, impaired 0 No balance) Mental Status Oriented to own ability 0 Yes Electronic Signature(s) Signed: 05/16/2020 4:50:33 PM By: Baruch Gouty RN, BSN Entered By: Baruch Gouty on 05/16/2020 15:39:17 -------------------------------------------------------------------------------- Foot Assessment Details Patient Name: Date of Service: Harry Foster 05/16/2020 2:45 PM Medical Record Number: [id number] Patient Account Number: [id number] Date of Birth/Sex: Treating RN: 08/15/1945 (74 y.o. Male) Baruch Gouty Primary Care Gavriel Holzhauer: [name] Other Clinician: Referring Orville Mena: Treating Vanita Cannell/Extender: [name] in Treatment: 0 Foot Assessment Items Site Locations + = Sensation present, - = Sensation absent, C = Callus, U = Ulcer R = Redness, W = Warmth, M = Maceration, PU = Pre-ulcerative lesion F = Fissure, S = Swelling, D = Dryness Assessment Right: Left: Other Deformity: No No Prior Foot  Ulcer: No No Prior Amputation: No No Charcot Joint: No No Ambulatory Status: Ambulatory Without Help Gait: Steady Electronic Signature(s) Signed: 05/16/2020 4:50:33 PM By: Baruch Gouty RN, BSN Entered By: Baruch Gouty on 05/16/2020 15:40:08 -------------------------------------------------------------------------------- Nutrition Risk Screening Details Patient Name: Date of Service: Harry Foster 05/16/2020 2:45 PM Medical Record Number: [id number] Patient Account Number: [id number] Date of Birth/Sex: Treating RN: [birth year] (74 y.o. Male) Baruch Gouty Primary Care Charman Blasco: [name] Other Clinician: Referring Kannon Baum: Treating Eidan Muellner/Extender: [name] in Treatment: 0 Height (in): 71 Weight (lbs): 295 Body Mass Index (BMI): 41.1 Nutrition Risk Screening Items Score Screening NUTRITION RISK SCREEN: I have an illness or condition that made me change the kind and/or amount of food I eat 0 No I eat fewer than two meals per day 0 No I eat few fruits and vegetables, or milk products 0 No I have three or more drinks of beer, liquor or wine almost every day 0 No I have tooth or mouth problems that make it hard for me to eat 0 No I don't always have enough money to buy the food I need 0 No I eat alone most of the time 1 Yes I take three or more different prescribed or over-the-counter drugs a day 1 Yes Without wanting to, I have lost or gained 10 pounds in the last six months 0 No I am not always physically able to shop, cook and/or feed myself 0 No Nutrition Protocols Good Risk Protocol 0 No interventions needed Moderate Risk Protocol High Risk Proctocol Risk Level: Good Risk Score: 2 Electronic Signature(s) Signed: 05/16/2020 4:50:33 PM By: Baruch Gouty RN, BSN Entered By: Baruch Gouty on 05/16/2020 15:39:55

## 2020-05-17 ENCOUNTER — Other Ambulatory Visit: Payer: Medicare Other

## 2020-05-18 DIAGNOSIS — I87313 Chronic venous hypertension (idiopathic) with ulcer of bilateral lower extremity: Secondary | ICD-10-CM | POA: Diagnosis not present

## 2020-05-18 DIAGNOSIS — S81801A Unspecified open wound, right lower leg, initial encounter: Secondary | ICD-10-CM | POA: Diagnosis not present

## 2020-05-18 DIAGNOSIS — M1A9XX1 Chronic gout, unspecified, with tophus (tophi): Secondary | ICD-10-CM | POA: Diagnosis not present

## 2020-05-18 NOTE — Progress Notes (Signed)
Harry Foster ([id number]) Visit Report for 05/16/2020 Allergy List Details Patient Name: Date of Service: Harry Foster 05/16/2020 2:45 PM Medical Record Number: [id number] Patient Account Number: [id number] Date of Birth/Sex: Treating RN: 03-09-1945 (75 y.o. Harry Foster Primary Care Lind Ausley: [name] Other Clinician: Referring Evelise Reine: Treating Andoni Busch/Extender: [name] in Treatment: 0 Allergies Active Allergies lisinopril Reaction: cough Quinolones Reaction: possible tendinitis Allergy Notes Electronic Signature(s) Signed: 05/16/2020 4:50:33 PM By: Baruch Gouty RN, BSN Entered By: Baruch Gouty on 05/16/2020 15:26:03 -------------------------------------------------------------------------------- Arrival Information Details Patient Name: Date of Service: Harry Foster 05/16/2020 2:45 PM Medical Record Number: [id number] Patient Account Number: [id number] Date of Birth/Sex: Treating RN: 1945/11/23 (75 y.o. Harry Foster Primary Care Katey Barrie: [name] Other Clinician: Referring Marcin Holte: Treating Maecie Sevcik/Extender: [name] in Treatment: 0 Visit Information Patient Arrived: Ambulatory Arrival Time: 15:11 Accompanied By: self Transfer Assistance: None Patient Identification Verified: Yes Secondary Verification Process Completed: Yes Patient Requires Transmission-Based Precautions: No Patient Has Alerts: No Electronic Signature(s) Signed: 05/16/2020 4:50:33 PM By: Baruch Gouty RN, BSN Entered By: Baruch Gouty on 05/16/2020 15:22:50 -------------------------------------------------------------------------------- Clinic Level of Care Assessment Details Patient Name: Date of Service: Harry Foster 05/16/2020 2:45 PM Medical Record Number: [id number] Patient Account Number: [id number] Date of Birth/Sex: Treating RN: 1945/10/10 (75 y.o. Harry Foster Primary Care Brodee Mauritz: [name] Other Clinician: Referring Kian Ottaviano: Treating Mariah Harn/Extender: [name] in Treatment: 0 Clinic Level of Care Assessment Items TOOL 1 Quantity Score X- 1 0 Use when EandM and Procedure is performed on INITIAL visit ASSESSMENTS - Nursing Assessment / Reassessment X- 1 20 General Physical Exam (combine w/ comprehensive assessment (listed just below) when performed on new pt. evals) X- 1 25 Comprehensive Assessment (HX, ROS, Risk Assessments, Wounds Hx, etc.) ASSESSMENTS - Wound and Skin Assessment / Reassessment []  - 0 Dermatologic / Skin Assessment (not related to wound area) ASSESSMENTS - Ostomy and/or Continence Assessment and Care []  - 0 Incontinence Assessment and Management []  - 0 Ostomy Care Assessment and Management (repouching, etc.) PROCESS - Coordination of Care X - Simple Patient / Family Education for ongoing care 1 15 []  - 0 Complex (extensive) Patient / Family Education for ongoing care X- 1 10 Staff obtains Programmer, systems, Records, T Results / Process Orders est []  - 0 Staff telephones HHA, Nursing Homes / Clarify orders / etc []  - 0 Routine Transfer to another Facility (non-emergent condition) []  - 0 Routine Hospital Admission (non-emergent condition) X- 1 15 New Admissions / Biomedical engineer / Ordering NPWT Apligraf, etc. , []  - 0 Emergency Hospital Admission (emergent condition) PROCESS - Special Needs []  - 0 Pediatric / Minor Patient Management []  - 0 Isolation Patient Management []  - 0 Hearing / Language / Visual special needs []  - 0 Assessment of Community assistance (transportation, D/C planning, etc.) []  - 0 Additional assistance / Altered mentation []  - 0 Support Surface(s) Assessment (bed, cushion, seat, etc.) INTERVENTIONS - Miscellaneous []  - 0 External ear exam []  - 0 Patient Transfer (multiple staff / Civil Service fast streamer / Similar devices) []  -  0 Simple Staple / Suture removal (25 or less) []  - 0 Complex Staple / Suture removal (26 or more) []  - 0 Hypo/Hyperglycemic Management (do not check if billed separately) X- 1 15 Ankle / Brachial Index (ABI) - do not check if billed separately Has the patient been seen at the hospital within the  last three years: Yes Total Score: 100 Level Of Care: New/Established - Level 3 Electronic Signature(s) Signed: 05/18/2020 6:30:22 PM By: Levan [name] RN, BSN Signed: 05/18/2020 6:30:22 PM By: Levan [name] RN, BSN Entered By: Levan [name] on 05/16/2020 17:24:02 -------------------------------------------------------------------------------- Encounter Discharge Information Details Patient Name: Date of Service: Harry Foster 05/16/2020 2:45 PM Medical Record Number: [id number] Patient Account Number: [id number] Date of Birth/Sex: Treating RN: [birth year] (75 y.o. Harry Foster Primary Care Shalyn Koral: [name] Other Clinician: Referring Fariha Goto: Treating Croy Drumwright/Extender: [name] in Treatment: 0 Encounter Discharge Information Items Discharge Condition: Stable Ambulatory Status: Ambulatory Discharge Destination: Home Transportation: Private Auto Schedule Follow-up Appointment: Yes Clinical Summary of Care: Provided on 05/16/2020 Form Type Recipient Paper Patient Patient Electronic Signature(s) Signed: 05/16/2020 4:26:11 PM By: Lorrin Jackson Entered By: Lorrin Jackson on 05/16/2020 16:26:11 -------------------------------------------------------------------------------- Lower Extremity Assessment Details Patient Name: Date of Service: Harry Foster 05/16/2020 2:45 PM Medical Record Number: [id number] Patient Account Number: [id number] Date of Birth/Sex: Treating RN: May 12, 1945 (75 y.o. Harry Foster Primary Care Abram Sax: [name] Other Clinician: Referring Ling Flesch: Treating Basia Mcginty/Extender:  [name] in Treatment: 0 Edema Assessment Assessed: [Left: No] [Right: No] Edema: [Left: Yes] [Right: Yes] Calf Left: Right: Point of Measurement: From Medial Instep 49 cm 53 cm Ankle Left: Right: Point of Measurement: From Medial Instep 30.8 cm 32 cm Knee To Floor Left: Right: From Medial Instep 45 cm 45 cm Vascular Assessment Pulses: Dorsalis Pedis Palpable: [Left:No] [Right:No] Blood Pressure: Brachial: [Left:95] Dorsalis Pedis: 146 [Left:Dorsalis Pedis: 623] Ankle: Posterior Tibial: 136 [Left:Posterior Tibial: 102 1.54] Electronic Signature(s) Signed: 05/16/2020 4:50:33 PM By: Baruch Gouty RN, BSN Signed: 05/18/2020 6:30:22 PM By: Levan [name] RN, BSN Entered By: Levan [name] on 05/16/2020 16:30:34 -------------------------------------------------------------------------------- Multi Wound Chart Details Patient Name: Date of Service: Harry Foster 05/16/2020 2:45 PM Medical Record Number: [id number] Patient Account Number: [id number] Date of Birth/Sex: Treating RN: April 13, 1945 (75 y.o. Harry Foster Primary Care Raunak Antuna: [name] Other Clinician: Referring Errika Narvaiz: Treating Avalene Sealy/Extender: [name] in Treatment: 0 Vital Signs Height(in): 33 Pulse(bpm): 71 Weight(lbs): 2 Blood Pressure(mmHg): 67/62 Body Mass Index(BMI): 41 Temperature(F): 98.2 Respiratory Rate(breaths/min): 18 Photos: [1:No Photos Left, Lateral Lower Leg] [2:No Photos Right, Medial Lower Leg] [3:No Photos Right, Distal, Lateral Lower Leg] Wound Location: [1:Blister] [2:Blister] [3:Blister] Wounding Event: [1:Venous Leg Ulcer] [2:Venous Leg Ulcer] [3:Venous Leg Ulcer] Primary Etiology: [1:Lymphedema] [2:Lymphedema] [3:Lymphedema] Secondary Etiology: [1:Cataracts, Arrhythmia, Congestive Cataracts, Arrhythmia, Congestive Cataracts, Arrhythmia, Congestive] Comorbid History: [1:Heart Failure,  Hypertension, Peripheral Heart Failure, Hypertension, Peripheral Heart Failure, Hypertension, Peripheral Venous Disease, Gout, Osteoarthritis Venous Disease, Gout, Osteoarthritis Venous Disease, Gout, Osteoarthritis  05/09/2020] [2:04/04/2020] [3:03/07/2020] Date Acquired: [1:0] [2:0] [3:0] Weeks of Treatment: [1:Open] [2:Open] [3:Open] Wound Status: [1:5.1x4.4x0.1] [2:1.7x4.2x0.1] [3:1.4x3.7x0.1] Measurements L x W x D (cm) [1:17.624] [2:5.608] [3:4.068] A (cm) : rea [1:1.762] [2:0.561] [3:0.407] Volume (cm) : [1:0.00%] [2:N/A] [3:N/A] % Reduction in Area: [1:0.00%] [2:N/A] [3:N/A] % Reduction in Volume: [1:Full Thickness Without Exposed] [2:Full Thickness Without Exposed] [3:Full Thickness Without Exposed] Classification: [1:Support Structures Large] [2:Support Structures Large] [3:Support Structures Medium] Exudate Amount: [1:Serous] [2:Serous] [3:Serous] Exudate Type: [1:amber] [2:amber] [3:amber] Exudate Color: [1:Flat and Intact] [2:Flat and Intact] [3:Flat and Intact] Wound Margin: [1:Large (67-100%)] [2:Large (67-100%)] [3:Large (67-100%)] Granulation Amount: [1:Red] [2:Red] [3:Red] Granulation Quality: [1:None Present (0%)] [2:None Present (0%)] [3:None Present (0%)] Necrotic Amount: [1:Fat Layer (Subcutaneous Tissue): Yes Fat Layer (Subcutaneous Tissue): Yes  Fat Layer (Subcutaneous Tissue): Yes] Exposed Structures: [1:Fascia: No Tendon: No Muscle: No Joint: No Bone: No Small (1-33%)] [2:Fascia: No Tendon: No Muscle: No Joint: No Bone: No Small (1-33%)] [3:Fascia: No Tendon: No Muscle: No Joint: No Bone: No Medium (34-66%)] Wound Number: 4 N/A N/A Photos: No Photos N/A N/A Right, Anterior Lower Leg N/A N/A Wound Location: Gradually Appeared N/A N/A Wounding Event: Venous Leg Ulcer N/A N/A Primary Etiology: Lymphedema N/A N/A Secondary Etiology: Cataracts, Arrhythmia, Congestive N/A N/A Comorbid History: Heart Failure, Hypertension, Peripheral Venous Disease, Gout,  Osteoarthritis 03/07/2020 N/A N/A Date Acquired: 0 N/A N/A Weeks of Treatment: Open N/A N/A Wound Status: 3.5x4x0.1 N/A N/A Measurements L x W x D (cm) 10.996 N/A N/A A (cm) : rea 1.1 N/A N/A Volume (cm) : N/A N/A N/A % Reduction in Area: N/A N/A N/A % Reduction in Volume: Full Thickness Without Exposed N/A N/A Classification: Support Structures Medium N/A N/A Exudate Amount: Serous N/A N/A Exudate Type: amber N/A N/A Exudate Color: Indistinct, nonvisible N/A N/A Wound Margin: Medium (34-66%) N/A N/A Granulation Amount: Red N/A N/A Granulation Quality: Medium (34-66%) N/A N/A Necrotic Amount: Fat Layer (Subcutaneous Tissue): Yes N/A N/A Exposed Structures: Fascia: No Tendon: No Muscle: No Joint: No Bone: No Medium (34-66%) N/A N/A Epithelialization: Treatment Notes Wound #1 (Lower Leg) Wound Laterality: Left, Lateral Cleanser Soap and Water Discharge Instruction: May shower and wash wound with dial antibacterial soap and water prior to dressing change. Peri-Wound Care Sween Lotion (Moisturizing lotion) Discharge Instruction: Apply moisturizing lotion as directed Topical Primary Dressing KerraCel Ag Gelling Fiber Dressing, 4x5 in (silver alginate) Discharge Instruction: Apply silver alginate to wound bed as instructed Secondary Dressing Zetuvit Plus 4x8 in Discharge Instruction: Apply over primary dressing as directed. ABD Pad, 8x10 Discharge Instruction: Apply over primary dressing as directed. Secured With Compression Wrap Kerlix Roll 4.5x3.1 (in/yd) Discharge Instruction: Apply Kerlix and Coban compression as directed. Coban Self-Adherent Wrap 4x5 (in/yd) Discharge Instruction: Apply over Kerlix as directed. Compression Stockings Add-Ons Wound #2 (Lower Leg) Wound Laterality: Right, Medial Cleanser Soap and Water Discharge Instruction: May shower and wash wound with dial antibacterial soap and water prior to dressing change. Peri-Wound  Care Sween Lotion (Moisturizing lotion) Discharge Instruction: Apply moisturizing lotion as directed Topical Primary Dressing KerraCel Ag Gelling Fiber Dressing, 4x5 in (silver alginate) Discharge Instruction: Apply silver alginate to wound bed as instructed Secondary Dressing Zetuvit Plus 4x8 in Discharge Instruction: Apply over primary dressing as directed. ABD Pad, 8x10 Discharge Instruction: Apply over primary dressing as directed. Secured With Compression Wrap Kerlix Roll 4.5x3.1 (in/yd) Discharge Instruction: Apply Kerlix and Coban compression as directed. Coban Self-Adherent Wrap 4x5 (in/yd) Discharge Instruction: Apply over Kerlix as directed. Compression Stockings Add-Ons Wound #3 (Lower Leg) Wound Laterality: Right, Lateral, Distal Cleanser Soap and Water Discharge Instruction: May shower and wash wound with dial antibacterial soap and water prior to dressing change. Peri-Wound Care Sween Lotion (Moisturizing lotion) Discharge Instruction: Apply moisturizing lotion as directed Topical Primary Dressing KerraCel Ag Gelling Fiber Dressing, 4x5 in (silver alginate) Discharge Instruction: Apply silver alginate to wound bed as instructed Secondary Dressing Zetuvit Plus 4x8 in Discharge Instruction: Apply over primary dressing as directed. ABD Pad, 8x10 Discharge Instruction: Apply over primary dressing as directed. Secured With Compression Wrap Kerlix Roll 4.5x3.1 (in/yd) Discharge Instruction: Apply Kerlix and Coban compression as directed. Coban Self-Adherent Wrap 4x5 (in/yd) Discharge Instruction: Apply over Kerlix as directed. Compression Stockings Add-Ons Wound #4 (Lower Leg) Wound Laterality: Right, Anterior Cleanser Soap and Water Discharge Instruction:  May shower and wash wound with dial antibacterial soap and water prior to dressing change. Peri-Wound Care Sween Lotion (Moisturizing lotion) Discharge Instruction: Apply moisturizing lotion as  directed Topical Primary Dressing KerraCel Ag Gelling Fiber Dressing, 4x5 in (silver alginate) Discharge Instruction: Apply silver alginate to wound bed as instructed Secondary Dressing Zetuvit Plus 4x8 in Discharge Instruction: Apply over primary dressing as directed. ABD Pad, 8x10 Discharge Instruction: Apply over primary dressing as directed. Secured With Compression Wrap Kerlix Roll 4.5x3.1 (in/yd) Discharge Instruction: Apply Kerlix and Coban compression as directed. Coban Self-Adherent Wrap 4x5 (in/yd) Discharge Instruction: Apply over Kerlix as directed. Compression Stockings Add-Ons Electronic Signature(s) Signed: 05/16/2020 4:38:21 PM By: Kalman Shan DO Signed: 05/18/2020 6:30:22 PM By: Levan [name] RN, BSN Entered By: Kalman Shan on 05/16/2020 16:29:44 -------------------------------------------------------------------------------- [place] Details Patient Name: Date of Service: Harry Foster 05/16/2020 2:45 PM Medical Record Number: [id number] Patient Account Number: [id number] Date of Birth/Sex: Treating RN: [birth year] (75 y.o. Harry Foster Primary Care Ilir Mahrt: [name] Other Clinician: Referring Nataliee Shurtz: Treating Jaskaran Dauzat/Extender: [name] in Treatment: 0 Multidisciplinary Care Plan reviewed with physician Active Inactive Venous Leg Ulcer Nursing Diagnoses: Actual venous Insuffiency (use after diagnosis is confirmed) Knowledge deficit related to disease process and management Goals: Patient will maintain optimal edema control Date Initiated: 05/16/2020 Target Resolution Date: 06/17/2020 Goal Status: Active Patient/caregiver will verbalize understanding of disease process and disease management Date Initiated: 05/16/2020 Target Resolution Date: 06/17/2020 Goal Status: Active Interventions: Assess peripheral edema status every visit. Compression as ordered Provide  education on venous insufficiency Notes: Wound/Skin Impairment Nursing Diagnoses: Impaired tissue integrity Knowledge deficit related to ulceration/compromised skin integrity Goals: Patient/caregiver will verbalize understanding of skin care regimen Date Initiated: 05/16/2020 Target Resolution Date: 06/17/2020 Goal Status: Active Ulcer/skin breakdown will have a volume reduction of 30% by week 4 Date Initiated: 05/16/2020 Target Resolution Date: 06/17/2020 Goal Status: Active Interventions: Assess patient/caregiver ability to obtain necessary supplies Assess patient/caregiver ability to perform ulcer/skin care regimen upon admission and as needed Assess ulceration(s) every visit Provide education on ulcer and skin care Notes: Electronic Signature(s) Signed: 05/18/2020 6:30:22 PM By: Levan [name] RN, BSN Entered By: Levan [name] on 05/16/2020 16:20:16 -------------------------------------------------------------------------------- Pain Assessment Details Patient Name: Date of Service: Harry Foster 05/16/2020 2:45 PM Medical Record Number: [id number] Patient Account Number: [id number] Date of Birth/Sex: Treating RN: [birth year] (75 y.o. Harry Foster Primary Care Ela Moffat: [name] Other Clinician: Referring Zein Helbing: Treating Zahrah Sutherlin/Extender: [name] in Treatment: 0 Active Problems Location of Pain Severity and Description of Pain Patient Has Paino Yes Site Locations Pain Location: Pain in Ulcers With Dressing Change: Yes Duration of the Pain. Constant / Intermittento Constant Rate the pain. Current Pain Level: 5 Worst Pain Level: 8 Least Pain Level: 5 Character of Pain Describe the Pain: Aching, Sharp, Throbbing Pain Management and Medication Current Pain Management: Medication: Yes Other: leg drop Rest: Yes Is the Current Pain Management Adequate: Adequate How does your wound impact your activities of  daily livingo Sleep: Yes Bathing: No Appetite: No Relationship With Others: No Bladder Continence: No Emotions: No Bowel Continence: No Work: No Toileting: No Drive: No Dressing: No Hobbies: No Electronic Signature(s) Signed: 05/16/2020 4:50:33 PM By: Baruch Gouty RN, BSN Entered By: Baruch Gouty on 05/16/2020 16:08:52 -------------------------------------------------------------------------------- Patient/Caregiver Education Details Patient Name: Date of Service: Harry Foster 5/16/2022andnbsp2:45 PM Medical Record Number: [id number] Patient Account Number: [id number] Date of Birth/Gender:  Treating RN: Feb 14, 1945 (75 y.o. Harry Foster Primary Care Physician: [name] Other Clinician: Referring Physician: Treating Physician/Extender: [name] in Treatment: 0 Education Assessment Education Provided To: Patient Education Topics Provided Venous: Methods: Explain/Verbal Responses: State content correctly Wound/Skin Impairment: Methods: Explain/Verbal Responses: State content correctly Electronic Signature(s) Signed: 05/18/2020 6:30:22 PM By: Levan [name] RN, BSN Entered By: Levan [name] on 05/16/2020 16:20:31 -------------------------------------------------------------------------------- Wound Assessment Details Patient Name: Date of Service: Harry Foster 05/16/2020 2:45 PM Medical Record Number: [id number] Patient Account Number: [id number] Date of Birth/Sex: Treating RN: January 03, 1945 (75 y.o. Harry Foster Primary Care Amaziah Raisanen: [name] Other Clinician: Referring Thurma Priego: Treating Zykerria Tanton/Extender: [name] in Treatment: 0 Wound Status Wound Number: 1 Primary Venous Leg Ulcer Etiology: Wound Location: Left, Lateral Lower Leg Secondary Lymphedema Wounding Event: Blister Etiology: Date Acquired: 05/09/2020 Wound Open Weeks Of Treatment:  0 Status: Clustered Wound: No Comorbid Cataracts, Arrhythmia, Congestive Heart Failure, Hypertension, History: Peripheral Venous Disease, Gout, Osteoarthritis Photos Wound Measurements Length: (cm) 5.1 Width: (cm) 4.4 Depth: (cm) 0.1 Area: (cm) 17.624 Volume: (cm) 1.762 % Reduction in Area: 0% % Reduction in Volume: 0% Epithelialization: Small (1-33%) Tunneling: No Undermining: No Wound Description Classification: Full Thickness Without Exposed Support Structures Wound Margin: Flat and Intact Exudate Amount: Large Exudate Type: Serous Exudate Color: amber Foul Odor After Cleansing: No Slough/Fibrino No Wound Bed Granulation Amount: Large (67-100%) Exposed Structure Granulation Quality: Red Fascia Exposed: No Necrotic Amount: None Present (0%) Fat Layer (Subcutaneous Tissue) Exposed: Yes Tendon Exposed: No Muscle Exposed: No Joint Exposed: No Bone Exposed: No Treatment Notes Wound #1 (Lower Leg) Wound Laterality: Left, Lateral Cleanser Soap and Water Discharge Instruction: May shower and wash wound with dial antibacterial soap and water prior to dressing change. Peri-Wound Care Sween Lotion (Moisturizing lotion) Discharge Instruction: Apply moisturizing lotion as directed Topical Primary Dressing KerraCel Ag Gelling Fiber Dressing, 4x5 in (silver alginate) Discharge Instruction: Apply silver alginate to wound bed as instructed Secondary Dressing Zetuvit Plus 4x8 in Discharge Instruction: Apply over primary dressing as directed. ABD Pad, 8x10 Discharge Instruction: Apply over primary dressing as directed. Secured With Compression Wrap Kerlix Roll 4.5x3.1 (in/yd) Discharge Instruction: Apply Kerlix and Coban compression as directed. Coban Self-Adherent Wrap 4x5 (in/yd) Discharge Instruction: Apply over Kerlix as directed. Compression Stockings Add-Ons Electronic Signature(s) Signed: 05/16/2020 5:26:16 PM By: [name] Signed: 05/17/2020 5:35:12 PM By:  Baruch Gouty RN, BSN Previous Signature: 05/16/2020 4:50:33 PM Version By: Baruch Gouty RN, BSN Entered By: [name] on 05/16/2020 16:50:42 -------------------------------------------------------------------------------- Wound Assessment Details Patient Name: Date of Service: Harry Foster 05/16/2020 2:45 PM Medical Record Number: [id number] Patient Account Number: [id number] Date of Birth/Sex: Treating RN: 12/17/1945 (75 y.o. Harry Foster Primary Care Lekendrick Alpern: [name] Other Clinician: Referring Franko Hilliker: Treating Alexandria Shiflett/Extender: [name] in Treatment: 0 Wound Status Wound Number: 2 Primary Venous Leg Ulcer Etiology: Wound Location: Right, Medial Lower Leg Secondary Lymphedema Wounding Event: Blister Etiology: Date Acquired: 04/04/2020 Wound Open Weeks Of Treatment: 0 Status: Clustered Wound: No Comorbid Cataracts, Arrhythmia, Congestive Heart Failure, Hypertension, History: Peripheral Venous Disease, Gout, Osteoarthritis Photos Wound Measurements Length: (cm) 1.7 Width: (cm) 4.2 Depth: (cm) 0.1 Area: (cm) 5.608 Volume: (cm) 0.561 % Reduction in Area: 0% % Reduction in Volume: 0% Epithelialization: Small (1-33%) Tunneling: No Undermining: No Wound Description Classification: Full Thickness Without Exposed Support Structures Wound Margin: Flat and Intact Exudate Amount: Large Exudate Type: Serous Exudate  Color: amber Foul Odor After Cleansing: No Slough/Fibrino No Wound Bed Granulation Amount: Large (67-100%) Exposed Structure Granulation Quality: Red Fascia Exposed: No Necrotic Amount: None Present (0%) Fat Layer (Subcutaneous Tissue) Exposed: Yes Tendon Exposed: No Muscle Exposed: No Joint Exposed: No Bone Exposed: No Treatment Notes Wound #2 (Lower Leg) Wound Laterality: Right, Medial Cleanser Soap and Water Discharge Instruction: May shower and wash wound with dial  antibacterial soap and water prior to dressing change. Peri-Wound Care Sween Lotion (Moisturizing lotion) Discharge Instruction: Apply moisturizing lotion as directed Topical Primary Dressing KerraCel Ag Gelling Fiber Dressing, 4x5 in (silver alginate) Discharge Instruction: Apply silver alginate to wound bed as instructed Secondary Dressing Zetuvit Plus 4x8 in Discharge Instruction: Apply over primary dressing as directed. ABD Pad, 8x10 Discharge Instruction: Apply over primary dressing as directed. Secured With Compression Wrap Kerlix Roll 4.5x3.1 (in/yd) Discharge Instruction: Apply Kerlix and Coban compression as directed. Coban Self-Adherent Wrap 4x5 (in/yd) Discharge Instruction: Apply over Kerlix as directed. Compression Stockings Add-Ons Electronic Signature(s) Signed: 05/16/2020 4:50:33 PM By: Baruch Gouty RN, BSN Signed: 05/16/2020 5:26:16 PM By: [name] Entered By: [name] on 05/16/2020 16:48:36 -------------------------------------------------------------------------------- Wound Assessment Details Patient Name: Date of Service: Harry Foster 05/16/2020 2:45 PM Medical Record Number: [id number] Patient Account Number: [id number] Date of Birth/Sex: Treating RN: November 06, 1945 (75 y.o. Harry Foster Primary Care Reshonda Koerber: [name] Other Clinician: Referring Macy Lingenfelter: Treating Daimien Patmon/Extender: [name] in Treatment: 0 Wound Status Wound Number: 3 Primary Venous Leg Ulcer Etiology: Wound Location: Right, Distal, Lateral Lower Leg Secondary Lymphedema Wounding Event: Blister Etiology: Date Acquired: 03/07/2020 Wound Open Weeks Of Treatment: 0 Status: Clustered Wound: No Comorbid Cataracts, Arrhythmia, Congestive Heart Failure, Hypertension, History: Peripheral Venous Disease, Gout, Osteoarthritis Photos Wound Measurements Length: (cm) 1.4 Width: (cm) 3.7 Depth: (cm) 0.1 Area: (cm)  4.068 Volume: (cm) 0.407 % Reduction in Area: 0% % Reduction in Volume: 0% Epithelialization: Medium (34-66%) Tunneling: No Undermining: No Wound Description Classification: Full Thickness Without Exposed Support Structures Wound Margin: Flat and Intact Exudate Amount: Medium Exudate Type: Serous Exudate Color: amber Foul Odor After Cleansing: No Slough/Fibrino No Wound Bed Granulation Amount: Large (67-100%) Exposed Structure Granulation Quality: Red Fascia Exposed: No Necrotic Amount: None Present (0%) Fat Layer (Subcutaneous Tissue) Exposed: Yes Tendon Exposed: No Muscle Exposed: No Joint Exposed: No Bone Exposed: No Treatment Notes Wound #3 (Lower Leg) Wound Laterality: Right, Lateral, Distal Cleanser Soap and Water Discharge Instruction: May shower and wash wound with dial antibacterial soap and water prior to dressing change. Peri-Wound Care Sween Lotion (Moisturizing lotion) Discharge Instruction: Apply moisturizing lotion as directed Topical Primary Dressing KerraCel Ag Gelling Fiber Dressing, 4x5 in (silver alginate) Discharge Instruction: Apply silver alginate to wound bed as instructed Secondary Dressing Zetuvit Plus 4x8 in Discharge Instruction: Apply over primary dressing as directed. ABD Pad, 8x10 Discharge Instruction: Apply over primary dressing as directed. Secured With Compression Wrap Kerlix Roll 4.5x3.1 (in/yd) Discharge Instruction: Apply Kerlix and Coban compression as directed. Coban Self-Adherent Wrap 4x5 (in/yd) Discharge Instruction: Apply over Kerlix as directed. Compression Stockings Add-Ons Electronic Signature(s) Signed: 05/16/2020 4:50:33 PM By: Baruch Gouty RN, BSN Signed: 05/16/2020 5:26:16 PM By: [name] Entered By: [name] on 05/16/2020 16:48:19 -------------------------------------------------------------------------------- Wound Assessment Details Patient Name: Date of Service: Harry Foster  05/16/2020 2:45 PM Medical Record Number: [id number] Patient Account Number: [id number] Date of Birth/Sex: Treating RN: Aug 12, 1945 (75 y.o. Harry Foster Primary Care Nasiyah Laverdiere: [name] Other Clinician: Referring Lavada Langsam:  Treating Tomisha Reppucci/Extender: [name] in Treatment: 0 Wound Status Wound Number: 4 Primary Venous Leg Ulcer Etiology: Wound Location: Right, Anterior Lower Leg Secondary Lymphedema Wounding Event: Gradually Appeared Etiology: Date Acquired: 03/07/2020 Wound Open Weeks Of Treatment: 0 Status: Clustered Wound: No Comorbid Cataracts, Arrhythmia, Congestive Heart Failure, Hypertension, History: Peripheral Venous Disease, Gout, Osteoarthritis Photos Wound Measurements Length: (cm) 3.5 Width: (cm) 4 Depth: (cm) 0.1 Area: (cm) 10.996 Volume: (cm) 1.1 % Reduction in Area: 0% % Reduction in Volume: 0% Epithelialization: Medium (34-66%) Tunneling: No Undermining: No Wound Description Classification: Full Thickness Without Exposed Support Structures Wound Margin: Indistinct, nonvisible Exudate Amount: Medium Exudate Type: Serous Exudate Color: amber Foul Odor After Cleansing: No Slough/Fibrino Yes Wound Bed Granulation Amount: Medium (34-66%) Exposed Structure Granulation Quality: Red Fascia Exposed: No Necrotic Amount: Medium (34-66%) Fat Layer (Subcutaneous Tissue) Exposed: Yes Necrotic Quality: Adherent Slough Tendon Exposed: No Muscle Exposed: No Joint Exposed: No Bone Exposed: No Treatment Notes Wound #4 (Lower Leg) Wound Laterality: Right, Anterior Cleanser Soap and Water Discharge Instruction: May shower and wash wound with dial antibacterial soap and water prior to dressing change. Peri-Wound Care Sween Lotion (Moisturizing lotion) Discharge Instruction: Apply moisturizing lotion as directed Topical Primary Dressing KerraCel Ag Gelling Fiber Dressing, 4x5 in (silver alginate) Discharge  Instruction: Apply silver alginate to wound bed as instructed Secondary Dressing Zetuvit Plus 4x8 in Discharge Instruction: Apply over primary dressing as directed. ABD Pad, 8x10 Discharge Instruction: Apply over primary dressing as directed. Secured With Compression Wrap Kerlix Roll 4.5x3.1 (in/yd) Discharge Instruction: Apply Kerlix and Coban compression as directed. Coban Self-Adherent Wrap 4x5 (in/yd) Discharge Instruction: Apply over Kerlix as directed. Compression Stockings Add-Ons Electronic Signature(s) Signed: 05/16/2020 4:50:33 PM By: Baruch Gouty RN, BSN Signed: 05/16/2020 5:26:16 PM By: [name] Entered By: [name] on 05/16/2020 16:47:21 -------------------------------------------------------------------------------- [place] Details Patient Name: Date of Service: Harry Foster 05/16/2020 2:45 PM Medical Record Number: [id number] Patient Account Number: [id number] Date of Birth/Sex: Treating RN: 02-15-1945 (75 y.o. Harry Foster Primary Care Nikalas Bramel: [name] Other Clinician: Referring Liona Wengert: Treating Zola Runion/Extender: [name] in Treatment: 0 Vital Signs Time Taken: 15:23 Temperature (F): 98.2 Height (in): 71 Pulse (bpm): 96 Source: Stated Respiratory Rate (breaths/min): 18 Weight (lbs): 295 Blood Pressure (mmHg): 95/62 Source: Stated Reference Range: 80 - 120 mg / dl Body Mass Index (BMI): 41.1 Electronic Signature(s) Signed: 05/16/2020 4:50:33 PM By: Baruch Gouty RN, BSN Entered By: Baruch Gouty on 05/16/2020 15:23:47

## 2020-05-19 ENCOUNTER — Encounter (HOSPITAL_BASED_OUTPATIENT_CLINIC_OR_DEPARTMENT_OTHER): Payer: Medicare Other | Admitting: Internal Medicine

## 2020-05-19 ENCOUNTER — Other Ambulatory Visit: Payer: Medicare Other

## 2020-05-19 ENCOUNTER — Other Ambulatory Visit: Payer: Self-pay

## 2020-05-19 DIAGNOSIS — M1A9XX1 Chronic gout, unspecified, with tophus (tophi): Secondary | ICD-10-CM | POA: Diagnosis not present

## 2020-05-19 DIAGNOSIS — I130 Hypertensive heart and chronic kidney disease with heart failure and stage 1 through stage 4 chronic kidney disease, or unspecified chronic kidney disease: Secondary | ICD-10-CM | POA: Diagnosis not present

## 2020-05-19 DIAGNOSIS — L97829 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ity: Secondary | ICD-10-CM | POA: Diagnosis not present

## 2020-05-19 DIAGNOSIS — N184 Chronic kidney disease, stage 4 (severe): Secondary | ICD-10-CM | POA: Diagnosis not present

## 2020-05-19 DIAGNOSIS — Z888 Allergy status to other drugs, medicaments and biological substances status: Secondary | ICD-10-CM | POA: Diagnosis not present

## 2020-05-19 DIAGNOSIS — Z7901 Long term (current) use of anticoagulants: Secondary | ICD-10-CM | POA: Diagnosis not present

## 2020-05-19 DIAGNOSIS — I4819 Other persistent atrial fibrillation: Secondary | ICD-10-CM | POA: Diagnosis not present

## 2020-05-19 DIAGNOSIS — I504 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 failure: Secondary | ICD-10-CM | POA: Diagnosis not present

## 2020-05-19 DIAGNOSIS — I872 Venous insufficiency (chronic) (peripheral): Secondary | ICD-10-CM | POA: Diagnosis not present

## 2020-05-19 DIAGNOSIS — L97819 Non-pressure chronic ulcer of other part of right lower leg with unspecified severity: Secondary | ICD-10-CM | POA: Diagnosis not present

## 2020-05-19 DIAGNOSIS — Z8249 Family history of ischemic heart disease and other diseases of the circulatory system: Secondary | ICD-10-CM | POA: Diagnosis not present

## 2020-05-19 NOTE — Progress Notes (Signed)
DAVARIOUS, TUMBLESON ([id number]) Visit Report for 05/19/2020 Arrival Information Details Patient Name: Date of Service: Harry Foster 05/19/2020 12:30 PM Medical Record Number: [id number] Patient Account Number: [id number] Date of Birth/Sex: Treating RN: [birth year] (75 y.o. [name] Primary Care Shaneal Barasch: [name] Other Clinician: Referring Freya Zobrist: Treating Jamarri Vuncannon/Extender: Rowland Lathe Weeks in Treatment: 0 Visit Information History Since Last Visit Added or deleted any medications: No Patient Arrived: Ambulatory Any new allergies or adverse reactions: No Arrival Time: 12:41 Had a fall or experienced change in No Transfer Assistance: None activities of daily living that may affect Patient Identification Verified: Yes risk of falls: Secondary Verification Process Completed: Yes Signs or symptoms of abuse/neglect since last visito No Patient Requires Transmission-Based Precautions: No Hospitalized since last visit: No Patient Has Alerts: No Implantable device outside of the clinic excluding No cellular tissue based products placed in the center since last visit: Has Dressing in Place as Prescribed: Yes Has Compression in Place as Prescribed: Yes Pain Present Now: No Electronic Signature(s) Signed: 05/19/2020 5:39:50 PM By: Lorrin Jackson Entered By: Lorrin Jackson on 05/19/2020 12:41:54 -------------------------------------------------------------------------------- Clinic Level of Care Assessment Details Patient Name: Date of Service: Harry Foster 05/19/2020 12:30 PM Medical Record Number: [id number] Patient Account Number: [id number] Date of Birth/Sex: Treating RN: 12-08-1945 (75 y.o. [name] Primary Care Shealeigh Dunstan: [name] Other Clinician: Referring Kawena Lyday: Treating Daren Yeagle/Extender: Rowland Lathe Weeks in Treatment: 0 Clinic Level of Care Assessment Items TOOL 4  Quantity Score X- 1 0 Use when only an EandM is performed on FOLLOW-UP visit ASSESSMENTS - Nursing Assessment / Reassessment []  - 0 Reassessment of Co-morbidities (includes updates in patient status) []  - 0 Reassessment of Adherence to Treatment Plan ASSESSMENTS - Wound and Skin A ssessment / Reassessment []  - 0 Simple Wound Assessment / Reassessment - one wound X- 4 5 Complex Wound Assessment / Reassessment - multiple wounds []  - 0 Dermatologic / Skin Assessment (not related to wound area) ASSESSMENTS - Focused Assessment []  - 0 Circumferential Edema Measurements - multi extremities []  - 0 Nutritional Assessment / Counseling / Intervention []  - 0 Lower Extremity Assessment (monofilament, tuning fork, pulses) []  - 0 Peripheral Arterial Disease Assessment (using hand held doppler) ASSESSMENTS - Ostomy and/or Continence Assessment and Care []  - 0 Incontinence Assessment and Management []  - 0 Ostomy Care Assessment and Management (repouching, etc.) PROCESS - Coordination of Care []  - 0 Simple Patient / Family Education for ongoing care X- 1 20 Complex (extensive) Patient / Family Education for ongoing care []  - 0 Staff obtains Programmer, systems, Records, T Results / Process Orders est []  - 0 Staff telephones HHA, Nursing Homes / Clarify orders / etc []  - 0 Routine Transfer to another Facility (non-emergent condition) []  - 0 Routine Hospital Admission (non-emergent condition) []  - 0 New Admissions / Biomedical engineer / Ordering NPWT Apligraf, etc. , []  - 0 Emergency Hospital Admission (emergent condition) []  - 0 Simple Discharge Coordination []  - 0 Complex (extensive) Discharge Coordination PROCESS - Special Needs []  - 0 Pediatric / Minor Patient Management []  - 0 Isolation Patient Management []  - 0 Hearing / Language / Visual special needs []  - 0 Assessment of Community assistance (transportation, D/C planning, etc.) []  - 0 Additional assistance / Altered  mentation []  - 0 Support Surface(s) Assessment (bed, cushion, seat, etc.) INTERVENTIONS - Wound Cleansing / Measurement []  - 0 Simple Wound Cleansing - one wound X- 4 5 Complex  Wound Cleansing - multiple wounds []  - 0 Wound Imaging (photographs - any number of wounds) []  - 0 Wound Tracing (instead of photographs) []  - 0 Simple Wound Measurement - one wound X- 4 5 Complex Wound Measurement - multiple wounds INTERVENTIONS - Wound Dressings []  - 0 Small Wound Dressing one or multiple wounds []  - 0 Medium Wound Dressing one or multiple wounds X- 2 20 Large Wound Dressing one or multiple wounds []  - 0 Application of Medications - topical []  - 0 Application of Medications - injection INTERVENTIONS - Miscellaneous []  - 0 External ear exam []  - 0 Specimen Collection (cultures, biopsies, blood, body fluids, etc.) []  - 0 Specimen(s) / Culture(s) sent or taken to Lab for analysis []  - 0 Patient Transfer (multiple staff / Civil Service fast streamer / Similar devices) []  - 0 Simple Staple / Suture removal (25 or less) []  - 0 Complex Staple / Suture removal (26 or more) []  - 0 Hypo / Hyperglycemic Management (close monitor of Blood Glucose) []  - 0 Ankle / Brachial Index (ABI) - do not check if billed separately X- 1 5 Vital Signs Has the patient been seen at the hospital within the last three years: Yes Total Score: 125 Level Of Care: New/Established - Level 4 Electronic Signature(s) Signed: 05/19/2020 5:39:50 PM By: Lorrin Jackson Entered By: Lorrin Jackson on 05/19/2020 13:16:58 -------------------------------------------------------------------------------- Encounter Discharge Information Details Patient Name: Date of Service: Harry Foster. 05/19/2020 12:30 PM Medical Record Number: [id number] Patient Account Number: [id number] Date of Birth/Sex: Treating RN: 01-08-1945 (75 y.o. [name] Primary Care Loucinda Croy: [name] Other Clinician: Referring  Brae Gartman: Treating Savon Cobbs/Extender: Rowland Lathe Weeks in Treatment: 0 Encounter Discharge Information Items Discharge Condition: Stable Ambulatory Status: Ambulatory Discharge Destination: Home Transportation: Private Auto Schedule Follow-up Appointment: Yes Clinical Summary of Care: Provided on 05/19/2020 Form Type Recipient Paper Patient Patient Electronic Signature(s) Signed: 05/19/2020 5:39:50 PM By: Lorrin Jackson Entered By: Lorrin Jackson on 05/19/2020 13:16:15 -------------------------------------------------------------------------------- Patient/Caregiver Education Details Patient Name: Date of Service: Harry Foster 5/19/2022andnbsp12:30 PM Medical Record Number: [id number] Patient Account Number: [id number] Date of Birth/Gender: Treating RN: 1945/07/01 (75 y.o. [name] Primary Care Physician: [name] Other Clinician: Referring Physician: Treating Physician/Extender: Rowland Lathe Weeks in Treatment: 0 Education Assessment Education Provided To: Patient Education Topics Provided Venous: Methods: Explain/Verbal, Printed Responses: State content correctly Wound/Skin Impairment: Methods: Explain/Verbal, Printed Responses: State content correctly Electronic Signature(s) Signed: 05/19/2020 5:39:50 PM By: Lorrin Jackson Entered By: Lorrin Jackson on 05/19/2020 13:15:57 -------------------------------------------------------------------------------- Wound Assessment Details Patient Name: Date of Service: Harry Foster. 05/19/2020 12:30 PM Medical Record Number: [id number] Patient Account Number: [id number] Date of Birth/Sex: Treating RN: 1945-08-14 (75 y.o. 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, Lateral Lower Leg Secondary Lymphedema Wounding Event: Blister Etiology: Date Acquired: 05/09/2020 Wound Open Weeks Of Treatment: 0 Status: Clustered Wound: No Comorbid Cataracts, Arrhythmia, Congestive Heart Failure, Hypertension, History: Peripheral Venous Disease, Gout, Osteoarthritis Wound Measurements Length: (cm) 5.1 Width: (cm) 4.4 Depth: (cm) 0.1 Area: (cm) 17.624 Volume: (cm) 1.762 % Reduction in Area: 0% % Reduction in Volume: 0% Epithelialization: Small (1-33%) Tunneling: No Undermining: No Wound Description Classification: Full Thickness Without Exposed Support Structures Wound Margin: Distinct, outline attached Exudate Amount: Medium Exudate Type: Serosanguineous Exudate Color:  red, brown Foul Odor After Cleansing: No Slough/Fibrino No Wound Bed Granulation Amount: Large (67-100%) Exposed Structure Granulation Quality: Red Fascia Exposed: No Necrotic Amount: None Present (0%) Fat Layer (Subcutaneous Tissue) Exposed: Yes Tendon Exposed: No Muscle Exposed: No Joint Exposed: No Bone Exposed: No Treatment Notes Wound #1 (Lower Leg) Wound Laterality: Left, Lateral Cleanser Soap and Water Discharge Instruction: May shower and wash wound with dial antibacterial soap and water prior to dressing change. Peri-Wound Care Sween Lotion (Moisturizing lotion) Discharge Instruction: Apply moisturizing lotion as directed Topical Primary Dressing KerraCel Ag Gelling Fiber Dressing, 4x5 in (silver alginate) Discharge Instruction: Apply silver alginate to wound bed as instructed Secondary Dressing Zetuvit Plus 4x8 in Discharge Instruction: Apply over primary dressing as directed. ABD Pad, 8x10 Discharge Instruction: Apply over primary dressing as directed. Secured With Compression Wrap Kerlix Roll 4.5x3.1 (in/yd) Discharge Instruction: Apply Kerlix and Coban compression as directed. Coban Self-Adherent Wrap 4x5  (in/yd) Discharge Instruction: Apply over Kerlix as directed. Compression Stockings Jobst Farrow Wrap 4000 Quantity: 1 Left Leg Compression Amount: 20-30 mmHg Discharge Instruction: Apply Francia Greaves daily as instructed. Apply first thing in the morning, remove at night before bed. Add-Ons Electronic Signature(s) Signed: 05/19/2020 5:39:50 PM By: Lorrin Jackson Entered By: Lorrin Jackson on 05/19/2020 13:13:36 -------------------------------------------------------------------------------- Wound Assessment Details Patient Name: Date of Service: Harry Foster 05/19/2020 12:30 PM Medical Record Number: [id number] Patient Account Number: [id number] Date of Birth/Sex: Treating RN: 1945-02-25 (75 y.o. [name] Primary Care Sherae Santino: [name] Other Clinician: Referring Mescal Flinchbaugh: Treating Orson Rho/Extender: Rowland Lathe Weeks in Treatment: 0 Wound Status Wound Number: 2 Primary Venous Leg Ulcer Etiology: Wound Location: Right, Medial Lower Leg Secondary Lymphedema Wounding Event: Blister Etiology: Date Acquired: 04/04/2020 Wound Open Weeks Of Treatment: 0 Status: Clustered Wound: No Comorbid Cataracts, Arrhythmia, Congestive Heart Failure, Hypertension, History: Peripheral Venous Disease, Gout, Osteoarthritis Wound Measurements Length: (cm) 1.7 Width: (cm) 4.2 Depth: (cm) 0.1 Area: (cm) 5.608 Volume: (cm) 0.561 % Reduction in Area: 0% % Reduction in Volume: 0% Epithelialization: Small (1-33%) Tunneling: No Undermining: No Wound Description Classification: Full Thickness Without Exposed Support Structures Wound Margin: Distinct, outline attached Exudate Amount: Large Exudate Type: Serous Exudate Color: amber Foul Odor After Cleansing: No Slough/Fibrino No Wound Bed Granulation Amount: Large (67-100%) Exposed Structure Granulation Quality: Red Fascia Exposed: No Necrotic Amount: None Present (0%) Fat Layer  (Subcutaneous Tissue) Exposed: Yes Tendon Exposed: No Muscle Exposed: No Joint Exposed: No Bone Exposed: No Assessment Notes Maceration noted Treatment Notes Wound #2 (Lower Leg) Wound Laterality: Right, Medial Cleanser Soap and Water Discharge Instruction: May shower and wash wound with dial antibacterial soap and water prior to dressing change. Peri-Wound Care Sween Lotion (Moisturizing lotion) Discharge Instruction: Apply moisturizing lotion as directed Topical Primary Dressing KerraCel Ag Gelling Fiber Dressing, 4x5 in (silver alginate) Discharge Instruction: Apply silver alginate to wound bed as instructed Secondary Dressing Zetuvit Plus 4x8 in Discharge Instruction: Apply over primary dressing as directed. ABD Pad, 8x10 Discharge Instruction: Apply over primary dressing as directed. Secured With Compression Wrap Kerlix Roll 4.5x3.1 (in/yd) Discharge Instruction: Apply Kerlix and Coban compression as directed. Coban Self-Adherent Wrap 4x5 (in/yd) Discharge Instruction: Apply over Kerlix as directed. Compression Stockings Add-Ons Electronic Signature(s) Signed: 05/19/2020 5:39:50 PM By: Lorrin Jackson Entered By: Lorrin Jackson on 05/19/2020 13:14:10 -------------------------------------------------------------------------------- Wound Assessment Details Patient Name: Date of Service: Harry Foster 05/19/2020 12:30 PM Medical Record Number: [id number] Patient Account Number: [id number] Date of Birth/Sex: Treating RN: December 19, 1945 (74 y.o.  [name] Primary Care Baylynn Shifflett: [name] Other Clinician: Referring Tex Conroy: Treating Chrystal Zeimet/Extender: Rowland Lathe Weeks in Treatment: 0 Wound Status Wound Number: 3 Primary Venous Leg Ulcer Etiology: Wound Location: Right, Distal, Lateral Lower Leg Secondary Lymphedema Wounding Event: Blister Etiology: Date Acquired: 03/07/2020 Wound Open Weeks Of Treatment:  0 Status: Clustered Wound: No Comorbid Cataracts, Arrhythmia, Congestive Heart Failure, Hypertension, History: Peripheral Venous Disease, Gout, Osteoarthritis Wound Measurements Length: (cm) 1.4 Width: (cm) 3.7 Depth: (cm) 0.1 Area: (cm) 4.068 Volume: (cm) 0.407 % Reduction in Area: 0% % Reduction in Volume: 0% Epithelialization: None Tunneling: No Undermining: No Wound Description Classification: Full Thickness Without Exposed Support Structures Wound Margin: Distinct, outline attached Exudate Amount: Medium Exudate Type: Serous Exudate Color: amber Foul Odor After Cleansing: No Slough/Fibrino No Wound Bed Granulation Amount: Large (67-100%) Exposed Structure Granulation Quality: Red Fascia Exposed: No Necrotic Amount: None Present (0%) Fat Layer (Subcutaneous Tissue) Exposed: Yes Tendon Exposed: No Muscle Exposed: No Joint Exposed: No Bone Exposed: No Assessment Notes Maceration noted Treatment Notes Wound #3 (Lower Leg) Wound Laterality: Right, Lateral, Distal Cleanser Soap and Water Discharge Instruction: May shower and wash wound with dial antibacterial soap and water prior to dressing change. Peri-Wound Care Sween Lotion (Moisturizing lotion) Discharge Instruction: Apply moisturizing lotion as directed Topical Primary Dressing KerraCel Ag Gelling Fiber Dressing, 4x5 in (silver alginate) Discharge Instruction: Apply silver alginate to wound bed as instructed Secondary Dressing Zetuvit Plus 4x8 in Discharge Instruction: Apply over primary dressing as directed. ABD Pad, 8x10 Discharge Instruction: Apply over primary dressing as directed. Secured With Compression Wrap Kerlix Roll 4.5x3.1 (in/yd) Discharge Instruction: Apply Kerlix and Coban compression as directed. Coban Self-Adherent Wrap 4x5 (in/yd) Discharge Instruction: Apply over Kerlix as directed. Compression Stockings Jobst Farrow Wrap 4000 Quantity: 1 Right Leg Compression Amount: 20-30  mmHg Discharge Instruction: Apply Francia Greaves daily as instructed. Apply first thing in the morning, remove at night before bed. Add-Ons Electronic Signature(s) Signed: 05/19/2020 5:39:50 PM By: Lorrin Jackson Entered By: Lorrin Jackson on 05/19/2020 13:14:47 -------------------------------------------------------------------------------- Wound Assessment Details Patient Name: Date of Service: Harry Foster 05/19/2020 12:30 PM Medical Record Number: [id number] Patient Account Number: [id number] Date of Birth/Sex: Treating RN: Aug 26, 1945 (75 y.o. [name] Primary Care Kelty Szafran: [name] Other Clinician: Referring Wendelyn Kiesling: Treating Iain Sawchuk/Extender: Rowland Lathe Weeks in Treatment: 0 Wound Status Wound Number: 4 Primary Venous Leg Ulcer Etiology: Wound Location: Right, Anterior Lower Leg Secondary Lymphedema Wounding Event: Gradually Appeared Etiology: Date Acquired: 03/07/2020 Wound Open Weeks Of Treatment: 0 Status: Clustered Wound: No Comorbid Cataracts, Arrhythmia, Congestive Heart Failure, Hypertension, History: Peripheral Venous Disease, Gout, Osteoarthritis Wound Measurements Length: (cm) 3.5 Width: (cm) 4 Depth: (cm) 0.1 Area: (cm) 10.996 Volume: (cm) 1.1 % Reduction in Area: 0% % Reduction in Volume: 0% Epithelialization: Medium (34-66%) Tunneling: No Undermining: No Wound Description Classification: Full Thickness Without Exposed Support Structures Wound Margin: Distinct, outline attached Exudate Amount: Medium Exudate Type: Serosanguineous Exudate Color: red, brown Foul Odor After Cleansing: No Slough/Fibrino Yes Wound Bed Granulation Amount: Large (67-100%) Exposed Structure Granulation Quality: Red Fascia Exposed: No Necrotic Amount: Small (1-33%) Fat Layer (Subcutaneous Tissue) Exposed: Yes Necrotic Quality: Adherent Slough Tendon Exposed: No Muscle Exposed: No Joint Exposed: No Bone Exposed:  No Treatment Notes Wound #4 (Lower Leg) Wound Laterality: Right, Anterior Cleanser Soap and Water Discharge Instruction: May shower and wash wound with dial antibacterial soap and water prior to dressing change. Peri-Wound Care Sween Lotion (Moisturizing lotion) Discharge  Instruction: Apply moisturizing lotion as directed Topical Primary Dressing KerraCel Ag Gelling Fiber Dressing, 4x5 in (silver alginate) Discharge Instruction: Apply silver alginate to wound bed as instructed Secondary Dressing Zetuvit Plus 4x8 in Discharge Instruction: Apply over primary dressing as directed. ABD Pad, 8x10 Discharge Instruction: Apply over primary dressing as directed. Secured With Compression Wrap Kerlix Roll 4.5x3.1 (in/yd) Discharge Instruction: Apply Kerlix and Coban compression as directed. Coban Self-Adherent Wrap 4x5 (in/yd) Discharge Instruction: Apply over Kerlix as directed. Compression Stockings Add-Ons Electronic Signature(s) Signed: 05/19/2020 5:39:50 PM By: Lorrin Jackson Entered By: Lorrin Jackson on 05/19/2020 13:15:19 -------------------------------------------------------------------------------- Vitals Details Patient Name: Date of Service: Harry Leventhal G. 05/19/2020 12:30 PM Medical Record Number: [id number] Patient Account Number: [id number] Date of Birth/Sex: Treating RN: 10-Dec-1945 (75 y.o. [name] Primary Care Letizia Hook: [name] Other Clinician: Referring Murl Golladay: Treating Lawrence Mitch/Extender: Rowland Lathe Weeks in Treatment: 0 Vital Signs Time Taken: 12:41 Temperature (F): 98.5 Height (in): 71 Pulse (bpm): 93 Weight (lbs): 295 Respiratory Rate (breaths/min): 18 Body Mass Index (BMI): 41.1 Blood Pressure (mmHg): 103/68 Reference Range: 80 - 120 mg / dl Electronic Signature(s) Signed: 05/19/2020 5:39:50 PM By: Lorrin Jackson Entered By: Lorrin Jackson on 05/19/2020 12:42:17

## 2020-05-19 NOTE — Progress Notes (Signed)
SUHEYB, RAUCCI ([id number]) Visit Report for 05/19/2020 SuperBill Details Patient Name: Date of Service: Harry Foster 05/19/2020 Medical Record Number: [id number] Patient Account Number: [id number] Date of Birth/Sex: Treating RN: [birth year] (75 y.o. 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

## 2020-05-23 ENCOUNTER — Encounter (HOSPITAL_BASED_OUTPATIENT_CLINIC_OR_DEPARTMENT_OTHER): Payer: Medicare Other | Admitting: Internal Medicine

## 2020-05-23 ENCOUNTER — Other Ambulatory Visit: Payer: Self-pay

## 2020-05-23 DIAGNOSIS — L97819 Non-pressure chronic ulcer of other part of right lower leg with unspecified severity: Secondary | ICD-10-CM | POA: Diagnosis not present

## 2020-05-23 DIAGNOSIS — I4819 Other persistent atrial fibrillation: Secondary | ICD-10-CM | POA: Diagnosis not present

## 2020-05-23 DIAGNOSIS — Z7901 Long term (current) use of anticoagulants: Secondary | ICD-10-CM | POA: Diagnosis not present

## 2020-05-23 DIAGNOSIS — N184 Chronic kidney disease, stage 4 (severe): Secondary | ICD-10-CM | POA: Diagnosis not present

## 2020-05-23 DIAGNOSIS — L97829 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ity: Secondary | ICD-10-CM | POA: Diagnosis not present

## 2020-05-23 DIAGNOSIS — I504 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 failure: Secondary | ICD-10-CM | POA: Diagnosis not present

## 2020-05-23 DIAGNOSIS — I872 Venous insufficiency (chronic) (peripheral): Secondary | ICD-10-CM | POA: Diagnosis not present

## 2020-05-23 DIAGNOSIS — Z888 Allergy status to other drugs, medicaments and biological substances status: Secondary | ICD-10-CM | POA: Diagnosis not present

## 2020-05-23 DIAGNOSIS — Z8249 Family history of ischemic heart disease and other diseases of the circulatory system: Secondary | ICD-10-CM | POA: Diagnosis not present

## 2020-05-23 DIAGNOSIS — L97919 Non-pressure chronic ulcer of unspecified part of right lower leg with unspecified severity: Secondary | ICD-10-CM

## 2020-05-23 DIAGNOSIS — I130 Hypertensive heart and chronic kidney disease with heart failure and stage 1 through stage 4 chronic kidney disease, or unspecified chronic kidney disease: Secondary | ICD-10-CM | POA: Diagnosis not present

## 2020-05-23 DIAGNOSIS — M1A9XX1 Chronic gout, unspecified, with tophus (tophi): Secondary | ICD-10-CM | POA: Diagnosis not present

## 2020-05-23 NOTE — Progress Notes (Signed)
Harry Foster, Harry Foster ([id number]) Visit Report for 05/23/2020 Arrival Information Details Patient Name: Date of Service: Harry Foster 05/23/2020 3:30 PM Medical Record Number: [id number] Patient Account Number: [id number] Date of Birth/Sex: Treating RN: [birth year] (75 y.o. Harry Foster, Harry Foster Primary Care Harry Foster: Harry Foster Other Clinician: Referring Harry Foster: Treating Harry Foster/Extender: Harry Foster Harry Foster: 1 Visit Information History Since Last Visit Added or deleted any medications: No Patient Arrived: Ambulatory Any new allergies or adverse reactions: No Arrival Time: 15:38 Had a fall or experienced change in No Accompanied By: self activities of daily living that may affect Transfer Assistance: None risk of falls: Patient Identification Verified: Yes Signs or symptoms of abuse/neglect since last visito No Secondary Verification Process Completed: Yes Hospitalized since last visit: No Patient Requires Transmission-Based Precautions: No Implantable device outside of the clinic excluding No Patient Has Alerts: No cellular tissue based products placed in the center since last visit: Has Dressing in Place as Prescribed: Yes Has Compression in Place as Prescribed: Yes Pain Present Now: No Notes Vein and Vascular test ordered for tomorrow at 1400. Electronic Signature(s) Signed: 05/23/2020 6:20:16 PM By: Harry Foster Entered By: Harry Foster on 05/23/2020 15:39:11 -------------------------------------------------------------------------------- Clinic Level of Care Assessment Details Patient Name: Date of Service: Harry Foster 05/23/2020 3:30 PM Medical Record Number: [id number] Patient Account Number: [id number] Date of Birth/Sex: Treating RN: 1945-05-20 (75 y.o. Harry Foster Primary Care Denisia Harpole: Harry Foster Other Clinician: Referring Harry Foster: Treating Harry Foster/Extender: Harry Foster Harry Foster: 1 Clinic Level of Care Assessment Items TOOL 4 Quantity Score X- 1 0 Use when only an EandM is performed on FOLLOW-UP visit ASSESSMENTS - Nursing Assessment / Reassessment X- 1 10 Reassessment of Co-morbidities (includes updates in patient status) X- 1 5 Reassessment of Adherence to Foster Plan ASSESSMENTS - Wound and Skin A ssessment / Reassessment []  - 0 Simple Wound Assessment / Reassessment - one wound X- 3 5 Complex Wound Assessment / Reassessment - multiple wounds []  - 0 Dermatologic / Skin Assessment (not related to wound area) ASSESSMENTS - Focused Assessment []  - 0 Circumferential Edema Measurements - multi extremities []  - 0 Nutritional Assessment / Counseling / Intervention X- 1 5 Lower Extremity Assessment (monofilament, tuning fork, pulses) []  - 0 Peripheral Arterial Disease Assessment (using hand held doppler) ASSESSMENTS - Ostomy and/or Continence Assessment and Care []  - 0 Incontinence Assessment and Management []  - 0 Ostomy Care Assessment and Management (repouching, etc.) PROCESS - Coordination of Care X - Simple Patient / Family Education for ongoing care 1 15 []  - 0 Complex (extensive) Patient / Family Education for ongoing care X- 1 10 Staff obtains Programmer, systems, Records, T Results / Process Orders est []  - 0 Staff telephones HHA, Nursing Homes / Clarify orders / etc []  - 0 Routine Transfer to another Facility (non-emergent condition) []  - 0 Routine Hospital Admission (non-emergent condition) []  - 0 New Admissions / Biomedical engineer / Ordering NPWT Apligraf, etc. , []  - 0 Emergency Hospital Admission (emergent condition) X- 1 10 Simple Discharge Coordination []  - 0 Complex (extensive) Discharge Coordination PROCESS - Special Needs []  - 0 Pediatric / Minor Patient Management []  - 0 Isolation Patient Management []  - 0 Hearing / Language / Visual special needs []  - 0 Assessment of Community assistance  (transportation, D/C planning, etc.) []  - 0 Additional assistance / Altered mentation []  - 0 Support Surface(s) Assessment (bed, cushion, seat, etc.) INTERVENTIONS - Wound Cleansing /  Measurement []  - 0 Simple Wound Cleansing - one wound X- 3 5 Complex Wound Cleansing - multiple wounds X- 1 5 Wound Imaging (photographs - any number of wounds) []  - 0 Wound Tracing (instead of photographs) []  - 0 Simple Wound Measurement - one wound X- 3 5 Complex Wound Measurement - multiple wounds INTERVENTIONS - Wound Dressings []  - 0 Small Wound Dressing one or multiple wounds []  - 0 Medium Wound Dressing one or multiple wounds X- 2 20 Large Wound Dressing one or multiple wounds []  - 0 Application of Medications - topical []  - 0 Application of Medications - injection INTERVENTIONS - Miscellaneous []  - 0 External ear exam []  - 0 Specimen Collection (cultures, biopsies, blood, body fluids, etc.) []  - 0 Specimen(s) / Culture(s) sent or taken to Lab for analysis []  - 0 Patient Transfer (multiple staff / Civil Service fast streamer / Similar devices) []  - 0 Simple Staple / Suture removal (25 or less) []  - 0 Complex Staple / Suture removal (26 or more) []  - 0 Hypo / Hyperglycemic Management (close monitor of Blood Glucose) []  - 0 Ankle / Brachial Index (ABI) - do not check if billed separately X- 1 5 Vital Signs Has the patient been seen at the hospital within the last three years: Yes Total Score: 150 Level Of Care: New/Established - Level 4 Electronic Signature(s) Signed: 05/23/2020 5:27:53 PM By: Harry [name] RN, BSN Entered By: Harry Foster on 05/23/2020 17:13:10 -------------------------------------------------------------------------------- Encounter Discharge Information Details Patient Name: Date of Service: Harry Foster 05/23/2020 3:30 PM Medical Record Number: [id number] Patient Account Number: [id number] Date of Birth/Sex: Treating RN: July 29, 1945 (75 y.o. Harry Foster Primary Care Harry Foster: Harry Foster Other Clinician: Referring Harry Foster: Treating Harry Foster/Extender: Harry Foster Harry Foster: 1 Encounter Discharge Information Items Discharge Condition: Stable Ambulatory Status: Ambulatory Discharge Destination: Home Transportation: Private Auto Schedule Follow-up Appointment: Yes Clinical Summary of Care: Provided on 05/23/2020 Form Type Recipient Paper Patient Patient Electronic Signature(s) Signed: 05/23/2020 5:19:31 PM By: Lorrin Jackson Entered By: Lorrin Jackson on 05/23/2020 17:19:31 -------------------------------------------------------------------------------- Lower Extremity Assessment Details Patient Name: Date of Service: Harry Foster 05/23/2020 3:30 PM Medical Record Number: [id number] Patient Account Number: [id number] Date of Birth/Sex: Treating RN: [birth year] (75 y.o. [name] Primary Care Mukund Weinreb: Harry Foster Other Clinician: Referring Alam Guterrez: Treating Valiant Dills/Extender: Harry Foster Harry Foster: 1 Edema Assessment Assessed: [Left: Yes] [Right: Yes] Edema: [Left: Yes] [Right: Yes] Calf Left: Right: Point of Measurement: From Medial Instep 44 cm 45 cm Ankle Left: Right: Point of Measurement: From Medial Instep 27 cm 29 cm Vascular Assessment Pulses: Dorsalis Pedis Palpable: [Left:Yes] [Right:Yes] Electronic Signature(s) Signed: 05/23/2020 6:20:16 PM By: Harry Foster Entered By: Harry Foster on 05/23/2020 15:55:44 -------------------------------------------------------------------------------- Multi Wound Chart Details Patient Name: Date of Service: Harry Foster 05/23/2020 3:30 PM Medical Record Number: [id number] Patient Account Number: [id number] Date of Birth/Sex: Treating RN: [birth year] (75 y.o. Harry Foster Primary Care Bron Snellings: Harry Foster Other Clinician: Referring Daviel Allegretto: Treating  Rogue Pautler/Extender: Harry Foster Harry Foster: 1 Vital Signs Height(in): 71 Pulse(bpm): 48 Weight(lbs): 295 Blood Pressure(mmHg): 96/60 Body Mass Index(BMI): 41 Temperature(F): 98.3 Respiratory Rate(breaths/min): 20 Photos: Left, Lateral Lower Leg Right, Medial Lower Leg Right, Distal, Lateral Lower Leg Wound Location: Blister Blister Blister Wounding Event: Venous Leg Ulcer Venous Leg Ulcer Venous Leg Ulcer Primary Etiology: Lymphedema Lymphedema Lymphedema Secondary Etiology: Cataracts, Arrhythmia, Congestive Cataracts, Arrhythmia, Congestive Cataracts, Arrhythmia,  Congestive Comorbid History: Heart Failure, Hypertension, Peripheral Heart Failure, Hypertension, Peripheral Heart Failure, Hypertension, Peripheral Venous Disease, Gout, Osteoarthritis Venous Disease, Gout, Osteoarthritis Venous Disease, Gout, Osteoarthritis 05/09/2020 04/04/2020 03/07/2020 Date Acquired: 1 1 1  Harry of Foster: Open Open Open Wound Status: 4.9x3.5x0.1 1x3x0.1 1.2x2x0.1 Measurements L x W x D (cm) 13.47 2.356 1.885 A (cm) : rea 1.347 0.236 0.188 Volume (cm) : 23.60% 58.00% 53.70% % Reduction in Area: 23.60% 57.90% 53.80% % Reduction in Volume: Full Thickness Without Exposed Full Thickness Without Exposed Full Thickness Without Exposed Classification: Support Structures Support Structures Support Structures Medium Medium Medium Exudate Amount: Serosanguineous Serosanguineous Serous Exudate Type: red, brown red, brown amber Exudate Color: Distinct, outline attached Distinct, outline attached Distinct, outline attached Wound Margin: Large (67-100%) Large (67-100%) Large (67-100%) Granulation Amount: Red Red Red Granulation Quality: Small (1-33%) Small (1-33%) None Present (0%) Necrotic Amount: Fat Layer (Subcutaneous Tissue): Yes Fat Layer (Subcutaneous Tissue): Yes Fat Layer (Subcutaneous Tissue): Yes Exposed Structures: Fascia: No Fascia:  No Fascia: No Tendon: No Tendon: No Tendon: No Muscle: No Muscle: No Muscle: No Joint: No Joint: No Joint: No Bone: No Bone: No Bone: No Medium (34-66%) Medium (34-66%) Large (67-100%) Epithelialization: Wound Number: 4 N/A N/A Photos: No Photos N/A N/A Right, Anterior Lower Leg N/A N/A Wound Location: Gradually Appeared N/A N/A Wounding Event: Venous Leg Ulcer N/A N/A Primary Etiology: Lymphedema N/A N/A Secondary Etiology: N/A N/A N/A Comorbid History: 03/07/2020 N/A N/A Date Acquired: 1 N/A N/A Harry of Foster: Healed - Epithelialized N/A N/A Wound Status: 0x0x0 N/A N/A Measurements L x W x D (cm) 0 N/A N/A A (cm) : rea 0 N/A N/A Volume (cm) : 100.00% N/A N/A % Reduction in A rea: 100.00% N/A N/A % Reduction in Volume: Full Thickness Without Exposed N/A N/A Classification: Support Structures N/A N/A N/A Exudate A mount: N/A N/A N/A Exudate Type: N/A N/A N/A Exudate Color: N/A N/A N/A Wound Margin: N/A N/A N/A Granulation A mount: N/A N/A N/A Granulation Quality: N/A N/A N/A Necrotic A mount: N/A N/A N/A Exposed Structures: N/A N/A N/A Epithelialization: Foster Notes Electronic Signature(s) Signed: 05/23/2020 5:05:23 PM By: Kalman Shan DO Signed: 05/23/2020 5:27:53 PM By: Harry [name] RN, BSN Entered By: Kalman Shan on 05/23/2020 17:00:55 -------------------------------------------------------------------------------- Multi-Disciplinary Care Plan Details Patient Name: Date of Service: Harry Foster 05/23/2020 3:30 PM Medical Record Number: [id number] Patient Account Number: [id number] Date of Birth/Sex: Treating RN: 1945-10-23 (74 y.o. Harry Foster Primary Care Kade Rickels: Harry Foster Other Clinician: Referring Parnika Tweten: Treating Calysta Craigo/Extender: [name] in Foster: 1 Multidisciplinary Care Plan reviewed with physician Active Inactive Venous Leg  Ulcer Nursing Diagnoses: Actual venous Insuffiency (use after diagnosis is confirmed) Knowledge deficit related to disease process and management Goals: Patient will maintain optimal edema control Date Initiated: 05/16/2020 Target Resolution Date: 06/17/2020 Goal Status: Active Patient/caregiver will verbalize understanding of disease process and disease management Date Initiated: 05/16/2020 Target Resolution Date: 06/17/2020 Goal Status: Active Interventions: Assess peripheral edema status every visit. Compression as ordered Provide education on venous insufficiency Notes: Wound/Skin Impairment Nursing Diagnoses: Impaired tissue integrity Knowledge deficit related to ulceration/compromised skin integrity Goals: Patient/caregiver will verbalize understanding of skin care regimen Date Initiated: 05/16/2020 Target Resolution Date: 06/17/2020 Goal Status: Active Ulcer/skin breakdown will have a volume reduction of 30% by week 4 Date Initiated: 05/16/2020 Target Resolution Date: 06/17/2020 Goal Status: Active Interventions: Assess patient/caregiver ability to obtain necessary supplies Assess patient/caregiver ability to perform ulcer/skin care regimen upon admission and  as needed Assess ulceration(s) every visit Provide education on ulcer and skin care Notes: Electronic Signature(s) Signed: 05/23/2020 5:27:53 PM By: Harry [name] RN, BSN Entered By: Harry Foster on 05/23/2020 16:38:00 -------------------------------------------------------------------------------- Pain Assessment Details Patient Name: Date of Service: Harry Foster 05/23/2020 3:30 PM Medical Record Number: [id number] Patient Account Number: [id number] Date of Birth/Sex: Treating RN: 10-Dec-1945 (75 y.o. [name] Primary Care Gertrue Willette: Harry Foster Other Clinician: Referring Darly Fails: Treating Jerni Selmer/Extender: Harry Foster Harry Foster: 1 Active  Problems Location of Pain Severity and Description of Pain Patient Has Paino No Site Locations Rate the pain. Current Pain Level: 0 Pain Management and Medication Current Pain Management: Medication: No Cold Application: No Rest: No Massage: No Activity: No T.E.N.S.: No Heat Application: No Leg drop or elevation: No Is the Current Pain Management Adequate: Adequate How does your wound impact your activities of daily livingo Sleep: No Bathing: No Appetite: No Relationship With Others: No Bladder Continence: No Emotions: No Bowel Continence: No Work: No Toileting: No Drive: No Dressing: No Hobbies: No Electronic Signature(s) Signed: 05/23/2020 6:20:16 PM By: Harry Foster Entered By: Harry Foster on 05/23/2020 15:55:00 -------------------------------------------------------------------------------- Patient/Caregiver Education Details Patient Name: Date of Service: Harry Foster 5/23/2022andnbsp3:30 PM Medical Record Number: [id number] Patient Account Number: [id number] Date of Birth/Gender: Treating RN: Jul 03, 1945 (75 y.o. Harry Foster Primary Care Physician: Harry Foster Other Clinician: Referring Physician: Treating Physician/Extender: [name] in Foster: 1 Education Assessment Education Provided To: Patient Education Topics Provided Wound/Skin Impairment: Methods: Explain/Verbal Responses: State content correctly Electronic Signature(s) Signed: 05/23/2020 5:27:53 PM By: Harry [name] RN, BSN Entered By: Harry Foster on 05/23/2020 16:38:55 -------------------------------------------------------------------------------- Wound Assessment Details Patient Name: Date of Service: Harry Foster 05/23/2020 3:30 PM Medical Record Number: [id number] Patient Account Number: [id number] Date of Birth/Sex: Treating RN: [birth year] (75 y.o. Harry Foster, Harry Foster Primary Care Kynleigh Artz: Harry Foster Other  Clinician: Referring Frederich Montilla: Treating Goodwin Kamphaus/Extender: Harry Foster Harry Foster: 1 Wound Status Wound Number: 1 Primary Venous Leg Ulcer Etiology: Wound Location: Left, Lateral Lower Leg Secondary Lymphedema Wounding Event: Blister Etiology: Date Acquired: 05/09/2020 Wound Open Harry Of Foster: 1 Status: Clustered Wound: No Comorbid Cataracts, Arrhythmia, Congestive Heart Failure, Hypertension, History: Peripheral Venous Disease, Gout, Osteoarthritis Photos Wound Measurements Length: (cm) 4.9 Width: (cm) 3.5 Depth: (cm) 0.1 Area: (cm) 13.47 Volume: (cm) 1.347 % Reduction in Area: 23.6% % Reduction in Volume: 23.6% Epithelialization: Medium (34-66%) Tunneling: No Undermining: No Wound Description Classification: Full Thickness Without Exposed Support Structures Wound Margin: Distinct, outline attached Exudate Amount: Medium Exudate Type: Serosanguineous Exudate Color: red, brown Foul Odor After Cleansing: No Slough/Fibrino Yes Wound Bed Granulation Amount: Large (67-100%) Exposed Structure Granulation Quality: Red Fascia Exposed: No Necrotic Amount: Small (1-33%) Fat Layer (Subcutaneous Tissue) Exposed: Yes Necrotic Quality: Adherent Slough Tendon Exposed: No Muscle Exposed: No Joint Exposed: No Bone Exposed: No Foster Notes Wound #1 (Lower Leg) Wound Laterality: Left, Lateral Cleanser Soap and Water Discharge Instruction: May shower and wash wound with dial antibacterial soap and water prior to dressing change. Peri-Wound Care Sween Lotion (Moisturizing lotion) Discharge Instruction: Apply moisturizing lotion as directed Topical Primary Dressing Hydrofera Blue Ready Foam, 4x5 in Discharge Instruction: Apply to wound bed as instructed Secondary Dressing Zetuvit Plus 4x8 in Discharge Instruction: Apply over primary dressing as directed. ABD Pad, 8x10 Discharge Instruction: Apply over primary dressing as  directed. Secured With Compression Wrap Kerlix Roll 4.5x3.1 (in/yd)  Discharge Instruction: Apply Kerlix and Coban compression as directed. Coban Self-Adherent Wrap 4x5 (in/yd) Discharge Instruction: Apply over Kerlix as directed. Compression Stockings Add-Ons Electronic Signature(s) Signed: 05/23/2020 5:06:59 PM By: [name] Signed: 05/23/2020 6:20:16 PM By: Harry Foster Entered By: [name] on 05/23/2020 16:43:27 -------------------------------------------------------------------------------- Wound Assessment Details Patient Name: Date of Service: Harry Foster 05/23/2020 3:30 PM Medical Record Number: [id number] Patient Account Number: [id number] Date of Birth/Sex: Treating RN: [birth year] (75 y.o. [name] Primary Care Linsay Vogt: Harry Foster Other Clinician: Referring Sherrika Weakland: Treating Airiel Oblinger/Extender: Harry Foster Harry Foster: 1 Wound Status Wound Number: 2 Primary Venous Leg Ulcer Etiology: Wound Location: Right, Medial Lower Leg Secondary Lymphedema Wounding Event: Blister Etiology: Date Acquired: 04/04/2020 Wound Open Harry Of Foster: 1 Status: Clustered Wound: No Comorbid Cataracts, Arrhythmia, Congestive Heart Failure, Hypertension, History: Peripheral Venous Disease, Gout, Osteoarthritis Photos Wound Measurements Length: (cm) 1 Width: (cm) 3 Depth: (cm) 0.1 Area: (cm) 2.356 Volume: (cm) 0.236 % Reduction in Area: 58% % Reduction in Volume: 57.9% Epithelialization: Medium (34-66%) Tunneling: No Undermining: No Wound Description Classification: Full Thickness Without Exposed Support Structures Wound Margin: Distinct, outline attached Exudate Amount: Medium Exudate Type: Serosanguineous Exudate Color: red, brown Foul Odor After Cleansing: No Slough/Fibrino Yes Wound Bed Granulation Amount: Large (67-100%) Exposed Structure Granulation Quality: Red Fascia Exposed: No Necrotic  Amount: Small (1-33%) Fat Layer (Subcutaneous Tissue) Exposed: Yes Necrotic Quality: Adherent Slough Tendon Exposed: No Muscle Exposed: No Joint Exposed: No Bone Exposed: No Foster Notes Wound #2 (Lower Leg) Wound Laterality: Right, Medial Cleanser Soap and Water Discharge Instruction: May shower and wash wound with dial antibacterial soap and water prior to dressing change. Peri-Wound Care Sween Lotion (Moisturizing lotion) Discharge Instruction: Apply moisturizing lotion as directed Topical Primary Dressing KerraCel Ag Gelling Fiber Dressing, 4x5 in (silver alginate) Discharge Instruction: Apply silver alginate to wound bed as instructed Secondary Dressing Zetuvit Plus 4x8 in Discharge Instruction: Apply over primary dressing as directed. ABD Pad, 8x10 Discharge Instruction: Apply over primary dressing as directed. Secured With Compression Wrap Kerlix Roll 4.5x3.1 (in/yd) Discharge Instruction: Apply Kerlix and Coban compression as directed. Coban Self-Adherent Wrap 4x5 (in/yd) Discharge Instruction: Apply over Kerlix as directed. Compression Stockings Add-Ons Electronic Signature(s) Signed: 05/23/2020 5:06:59 PM By: [name] Signed: 05/23/2020 6:20:16 PM By: Harry Foster Entered By: [name] on 05/23/2020 16:42:53 -------------------------------------------------------------------------------- Wound Assessment Details Patient Name: Date of Service: Harry Foster 05/23/2020 3:30 PM Medical Record Number: [id number] Patient Account Number: [id number] Date of Birth/Sex: Treating RN: [birth year] (75 y.o. Harry Foster, Harry Foster Primary Care Chiyeko Ferre: Harry Foster Other Clinician: Referring Darcie Mellone: Treating Lazarius Rivkin/Extender: Harry Foster Harry Foster: 1 Wound Status Wound Number: 3 Primary Venous Leg Ulcer Etiology: Wound Location: Right, Distal, Lateral Lower Leg Secondary Lymphedema Wounding Event:  Blister Etiology: Date Acquired: 03/07/2020 Wound Open Harry Of Foster: 1 Status: Clustered Wound: No Comorbid Cataracts, Arrhythmia, Congestive Heart Failure, Hypertension, History: Peripheral Venous Disease, Gout, Osteoarthritis Photos Wound Measurements Length: (cm) 1.2 Width: (cm) 2 Depth: (cm) 0.1 Area: (cm) 1.885 Volume: (cm) 0.188 % Reduction in Area: 53.7% % Reduction in Volume: 53.8% Epithelialization: Large (67-100%) Tunneling: No Undermining: No Wound Description Classification: Full Thickness Without Exposed Support Structures Wound Margin: Distinct, outline attached Exudate Amount: Medium Exudate Type: Serous Exudate Color: amber Foul Odor After Cleansing: No Slough/Fibrino No Wound Bed Granulation Amount: Large (67-100%) Exposed Structure Granulation Quality: Red Fascia Exposed: No Necrotic Amount: None Present (0%) Fat Layer (Subcutaneous Tissue)  Exposed: Yes Tendon Exposed: No Muscle Exposed: No Joint Exposed: No Bone Exposed: No Foster Notes Wound #3 (Lower Leg) Wound Laterality: Right, Lateral, Distal Cleanser Soap and Water Discharge Instruction: May shower and wash wound with dial antibacterial soap and water prior to dressing change. Peri-Wound Care Sween Lotion (Moisturizing lotion) Discharge Instruction: Apply moisturizing lotion as directed Topical Primary Dressing KerraCel Ag Gelling Fiber Dressing, 4x5 in (silver alginate) Discharge Instruction: Apply silver alginate to wound bed as instructed Secondary Dressing Zetuvit Plus 4x8 in Discharge Instruction: Apply over primary dressing as directed. ABD Pad, 8x10 Discharge Instruction: Apply over primary dressing as directed. Secured With Compression Wrap Kerlix Roll 4.5x3.1 (in/yd) Discharge Instruction: Apply Kerlix and Coban compression as directed. Coban Self-Adherent Wrap 4x5 (in/yd) Discharge Instruction: Apply over Kerlix as directed. Compression  Stockings Add-Ons Electronic Signature(s) Signed: 05/23/2020 5:06:59 PM By: [name] Signed: 05/23/2020 6:20:16 PM By: Harry Foster Entered By: [name] on 05/23/2020 16:43:10 -------------------------------------------------------------------------------- Wound Assessment Details Patient Name: Date of Service: Harry Foster 05/23/2020 3:30 PM Medical Record Number: [id number] Patient Account Number: [id number] Date of Birth/Sex: Treating RN: November 15, 1945 (75 y.o. Harry Foster, Harry Foster Primary Care Travonne Schowalter: Harry Foster Other Clinician: Referring Ajmal Kathan: Treating Nikalas Bramel/Extender: Harry Foster Harry Foster: 1 Wound Status Wound Number: 4 Primary Etiology: Venous Leg Ulcer Wound Location: Right, Anterior Lower Leg Secondary Etiology: Lymphedema Wounding Event: Gradually Appeared Wound Status: Healed - Epithelialized Date Acquired: 03/07/2020 Harry Of Foster: 1 Clustered Wound: No Wound Measurements Length: (cm) Width: (cm) Depth: (cm) Area: (cm) Volume: (cm) 0 % Reduction in Area: 100% 0 % Reduction in Volume: 100% 0 0 0 Wound Description Classification: Full Thickness Without Exposed Support Structur es Foster Notes Wound #4 (Lower Leg) Wound Laterality: Right, Anterior Cleanser Peri-Wound Care Topical Primary Dressing Secondary Dressing Secured With Compression Wrap Compression Stockings Add-Ons Electronic Signature(s) Signed: 05/23/2020 6:20:16 PM By: Harry Foster Entered By: Harry Foster on 05/23/2020 15:57:05 -------------------------------------------------------------------------------- Vitals Details Patient Name: Date of Service: Harry Foster 05/23/2020 3:30 PM Medical Record Number: [id number] Patient Account Number: [id number] Date of Birth/Sex: Treating RN: 1945/04/21 (75 y.o. Harry Foster, Harry Foster Primary Care Cayleen Benjamin: Harry Foster Other Clinician: Referring  Skyeler Scalese: Treating Shyanna Klingel/Extender: Harry Foster Harry Foster: 1 Vital Signs Time Taken: 15:45 Temperature (F): 98.3 Height (in): 71 Pulse (bpm): 48 Weight (lbs): 295 Respiratory Rate (breaths/min): 20 Body Mass Index (BMI): 41.1 Blood Pressure (mmHg): 96/60 Reference Range: 80 - 120 mg / dl Electronic Signature(s) Signed: 05/23/2020 6:20:16 PM By: Harry Foster Entered By: Harry Foster on 05/23/2020 15:54:49

## 2020-05-24 ENCOUNTER — Encounter (HOSPITAL_BASED_OUTPATIENT_CLINIC_OR_DEPARTMENT_OTHER): Payer: Medicare Other | Admitting: Internal Medicine

## 2020-05-24 ENCOUNTER — Ambulatory Visit (HOSPITAL_COMMUNITY)
Admission: RE | Admit: 2020-05-24 | Discharge: 2020-05-24 | Disposition: A | Discharge status: Home / Self Care | Payer: Medicare Other | Source: Ambulatory Visit | Attending: Internal Medicine | Admitting: Internal Medicine

## 2020-05-24 ENCOUNTER — Other Ambulatory Visit (HOSPITAL_COMMUNITY): Payer: Self-pay | Admitting: Internal Medicine

## 2020-05-24 ENCOUNTER — Other Ambulatory Visit: Payer: Self-pay

## 2020-05-24 DIAGNOSIS — L97829 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ity: Secondary | ICD-10-CM | POA: Diagnosis not present

## 2020-05-24 DIAGNOSIS — I83028 Varicose veins of left lower extremity with ulcer other part of lower leg: Secondary | ICD-10-CM | POA: Diagnosis not present

## 2020-05-31 ENCOUNTER — Encounter (HOSPITAL_BASED_OUTPATIENT_CLINIC_OR_DEPARTMENT_OTHER): Payer: Medicare Other | Admitting: Internal Medicine

## 2020-05-31 ENCOUNTER — Other Ambulatory Visit: Payer: Self-pay

## 2020-05-31 DIAGNOSIS — Z8249 Family history of ischemic heart disease and other diseases of the circulatory system: Secondary | ICD-10-CM | POA: Diagnosis not present

## 2020-05-31 DIAGNOSIS — L97919 Non-pressure chronic ulcer of unspecified part of right lower leg with unspecified severity: Secondary | ICD-10-CM | POA: Diagnosis not present

## 2020-05-31 DIAGNOSIS — I4819 Other persistent atrial fibrillation: Secondary | ICD-10-CM | POA: Diagnosis not present

## 2020-05-31 DIAGNOSIS — L97829 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ity: Secondary | ICD-10-CM | POA: Diagnosis not present

## 2020-05-31 DIAGNOSIS — M1A9XX1 Chronic gout, unspecified, with tophus (tophi): Secondary | ICD-10-CM | POA: Diagnosis not present

## 2020-05-31 DIAGNOSIS — I872 Venous insufficiency (chronic) (peripheral): Secondary | ICD-10-CM

## 2020-05-31 DIAGNOSIS — N184 Chronic kidney disease, stage 4 (severe): Secondary | ICD-10-CM | POA: Diagnosis not present

## 2020-05-31 DIAGNOSIS — I504 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 failure: Secondary | ICD-10-CM | POA: Diagnosis not present

## 2020-05-31 DIAGNOSIS — I130 Hypertensive heart and chronic kidney disease with heart failure and stage 1 through stage 4 chronic kidney disease, or unspecified chronic kidney disease: Secondary | ICD-10-CM | POA: Diagnosis not present

## 2020-05-31 DIAGNOSIS — L97819 Non-pressure chronic ulcer of other part of right lower leg with unspecified severity: Secondary | ICD-10-CM | POA: Diagnosis not present

## 2020-05-31 DIAGNOSIS — Z7901 Long term (current) use of anticoagulants: Secondary | ICD-10-CM | POA: Diagnosis not present

## 2020-05-31 DIAGNOSIS — Z888 Allergy status to other drugs, medicaments and biological substances status: Secondary | ICD-10-CM | POA: Diagnosis not present

## 2020-05-31 NOTE — Progress Notes (Signed)
SENECA, HOBACK ([id number]) Visit Report for 05/31/2020 Arrival Information Details Patient Name: Date of Service: Harry Foster 05/31/2020 2:45 PM Medical Record Number: [id number] Patient Account Number: [id number] Date of Birth/Sex: Treating RN: 02/07/1945 (75 y.o. [name] Primary Care Arissa Fagin: [name] Other Clinician: Referring Iain Sawchuk: Treating Kaly Mcquary/Extender: [name] in Treatment: 2 Visit Information History Since Last Visit Added or deleted any medications: No Patient Arrived: Ambulatory Any new allergies or adverse reactions: No Arrival Time: 15:13 Had a fall or experienced change in No Accompanied By: self activities of daily living that may affect Transfer Assistance: None risk of falls: Patient Identification Verified: Yes Signs or symptoms of abuse/neglect since last visito No Secondary Verification Process Completed: Yes Hospitalized since last visit: No Patient Requires Transmission-Based Precautions: No Implantable device outside of the clinic excluding No Patient Has Alerts: Yes cellular tissue based products placed in the center Patient Alerts: R ABI: 1.63 TBI: 1.36 since last visit: L ABI: 1.49 TBI: 1.34 Has Dressing in Place as Prescribed: Yes 05/2020 Pain Present Now: No Electronic Signature(s) Signed: 05/31/2020 5:56:30 PM By: Levan [name] RN, BSN Entered By: Levan [name] on 05/31/2020 15:34:07 -------------------------------------------------------------------------------- Clinic Level of Care Assessment Details Patient Name: Date of Service: Harry Foster 05/31/2020 2:45 PM Medical Record Number: [id number] Patient Account Number: [id number] Date of Birth/Sex: Treating RN: 05-26-1945 (75 y.o. [name] Primary Care Avianna Moynahan: [name] Other Clinician: Referring Destin Kittler: Treating Sharelle Burditt/Extender: [name] in Treatment:  2 Clinic Level of Care Assessment Items TOOL 4 Quantity Score X- 1 0 Use when only an EandM is performed on FOLLOW-UP visit ASSESSMENTS - Nursing Assessment / Reassessment X- 1 10 Reassessment of Co-morbidities (includes updates in patient status) X- 1 5 Reassessment of Adherence to Treatment Plan ASSESSMENTS - Wound and Skin A ssessment / Reassessment X - Simple Wound Assessment / Reassessment - one wound 1 5 []  - 0 Complex Wound Assessment / Reassessment - multiple wounds []  - 0 Dermatologic / Skin Assessment (not related to wound area) ASSESSMENTS - Focused Assessment []  - 0 Circumferential Edema Measurements - multi extremities []  - 0 Nutritional Assessment / Counseling / Intervention X- 1 5 Lower Extremity Assessment (monofilament, tuning fork, pulses) []  - 0 Peripheral Arterial Disease Assessment (using hand held doppler) ASSESSMENTS - Ostomy and/or Continence Assessment and Care []  - 0 Incontinence Assessment and Management []  - 0 Ostomy Care Assessment and Management (repouching, etc.) PROCESS - Coordination of Care X - Simple Patient / Family Education for ongoing care 1 15 []  - 0 Complex (extensive) Patient / Family Education for ongoing care X- 1 10 Staff obtains Programmer, systems, Records, T Results / Process Orders est []  - 0 Staff telephones HHA, Nursing Homes / Clarify orders / etc []  - 0 Routine Transfer to another Facility (non-emergent condition) []  - 0 Routine Hospital Admission (non-emergent condition) []  - 0 New Admissions / Biomedical engineer / Ordering NPWT Apligraf, etc. , []  - 0 Emergency Hospital Admission (emergent condition) X- 1 10 Simple Discharge Coordination []  - 0 Complex (extensive) Discharge Coordination PROCESS - Special Needs []  - 0 Pediatric / Minor Patient Management []  - 0 Isolation Patient Management []  - 0 Hearing / Language / Visual special needs []  - 0 Assessment of Community assistance (transportation, D/C planning,  etc.) []  - 0 Additional assistance / Altered mentation []  - 0 Support Surface(s) Assessment (bed, cushion, seat, etc.) INTERVENTIONS - Wound Cleansing / Measurement  X - Simple Wound Cleansing - one wound 1 5 []  - 0 Complex Wound Cleansing - multiple wounds X- 1 5 Wound Imaging (photographs - any number of wounds) []  - 0 Wound Tracing (instead of photographs) X- 1 5 Simple Wound Measurement - one wound []  - 0 Complex Wound Measurement - multiple wounds INTERVENTIONS - Wound Dressings []  - 0 Small Wound Dressing one or multiple wounds []  - 0 Medium Wound Dressing one or multiple wounds X- 1 20 Large Wound Dressing one or multiple wounds X- 1 5 Application of Medications - topical []  - 0 Application of Medications - injection INTERVENTIONS - Miscellaneous []  - 0 External ear exam X- 1 5 Specimen Collection (cultures, biopsies, blood, body fluids, etc.) X- 1 5 Specimen(s) / Culture(s) sent or taken to Lab for analysis []  - 0 Patient Transfer (multiple staff / Civil Service fast streamer / Similar devices) []  - 0 Simple Staple / Suture removal (25 or less) []  - 0 Complex Staple / Suture removal (26 or more) []  - 0 Hypo / Hyperglycemic Management (close monitor of Blood Glucose) []  - 0 Ankle / Brachial Index (ABI) - do not check if billed separately X- 1 5 Vital Signs Has the patient been seen at the hospital within the last three years: Yes Total Score: 115 Level Of Care: New/Established - Level 3 Electronic Signature(s) Signed: 05/31/2020 5:56:30 PM By: Levan [name] RN, BSN Entered By: Levan [name] on 05/31/2020 17:48:41 -------------------------------------------------------------------------------- Encounter Discharge Information Details Patient Name: Date of Service: Harry Foster 05/31/2020 2:45 PM Medical Record Number: [id number] Patient Account Number: [id number] Date of Birth/Sex: Treating RN: 29-Nov-1945 (75 y.o. [name] Primary Care Devanshi Califf: [name] Other Clinician: Referring Lenora Gomes: Treating Adilynne Fitzwater/Extender: [name] in Treatment: 2 Encounter Discharge Information Items Discharge Condition: Stable Ambulatory Status: Ambulatory Discharge Destination: Home Transportation: Private Auto Schedule Follow-up Appointment: Yes Clinical Summary of Care: Provided on 05/31/2020 Form Type Recipient Paper Patient Patient Electronic Signature(s) Signed: 05/31/2020 5:23:55 PM By: Lorrin Jackson Entered By: Lorrin Jackson on 05/31/2020 17:23:55 -------------------------------------------------------------------------------- Lower Extremity Assessment Details Patient Name: Date of Service: Harry Foster 05/31/2020 2:45 PM Medical Record Number: [id number] Patient Account Number: [id number] Date of Birth/Sex: Treating RN: 26-Mar-1945 (75 y.o. [name] Primary Care Jeanenne Licea: [name] Other Clinician: Referring Mattilynn Forrer: Treating Wilmont Olund/Extender: [name] in Treatment: 2 Edema Assessment Assessed: [Left: Yes] [Right: Yes] Edema: [Left: Yes] [Right: Yes] Calf Left: Right: Point of Measurement: From Medial Instep 41 cm 49 cm Ankle Left: Right: Point of Measurement: From Medial Instep 27 cm 31 cm Vascular Assessment Pulses: Dorsalis Pedis Palpable: [Left:Yes] [Right:Yes] Electronic Signature(s) Signed: 05/31/2020 5:34:16 PM By: Deon Pilling Entered By: Deon Pilling on 05/31/2020 15:32:32 -------------------------------------------------------------------------------- Multi Wound Chart Details Patient Name: Date of Service: Harry Foster 05/31/2020 2:45 PM Medical Record Number: [id number] Patient Account Number: [id number] Date of Birth/Sex: Treating RN: 1945/06/09 (75 y.o. [name] Primary Care Luisalberto Beegle: [name] Other Clinician: Referring Wells Mabe: Treating Jackolyn Geron/Extender: [name] in Treatment: 2 Vital Signs Height(in): 71 Pulse(bpm): 75 Weight(lbs): 295 Blood Pressure(mmHg): 94/54 Body Mass Index(BMI): 41 Temperature(F): 98.2 Respiratory Rate(breaths/min): 20 Photos: [1:No Photos Left, Lateral Lower Leg] [2:No Photos Right, Medial Lower Leg] [3:No Photos Right, Distal, Lateral Lower Leg] Wound Location: [1:Blister] [2:Blister] [3:Blister] Wounding Event: [1:Venous Leg Ulcer] [2:Venous Leg Ulcer] [3:Venous Leg Ulcer] Primary Etiology: [1:Lymphedema] [2:Lymphedema] [3:Lymphedema] Secondary Etiology: [1:Cataracts, Arrhythmia,  Congestive Cataracts, Arrhythmia, Congestive N/A] Comorbid History: [1:Heart Failure, Hypertension, Peripheral Heart Failure, Hypertension, Peripheral Venous Disease, Gout, Osteoarthritis Venous Disease, Gout, Osteoarthritis 05/09/2020] [2:04/04/2020] [3:03/07/2020] Date Acquired: [1:2] [2:2] [3:2] Weeks of Treatment: [1:Open] [2:Open] [3:Open] Wound Status: [1:2.5x1.2x0.1] [2:0.3x0.3x0.1] [3:0x0x0] Measurements L x W x D (cm) [1:2.356] [2:0.071] [3:0] A (cm) : rea [1:0.236] [2:0.007] [3:0] Volume (cm) : [1:86.60%] [2:98.70%] [3:100.00%] % Reduction in Area: [1:86.60%] [2:98.80%] [3:100.00%] % Reduction in Volume: [1:Full Thickness Without Exposed] [2:Full Thickness Without Exposed] [3:Full Thickness Without Exposed] Classification: [1:Support Structures Medium] [2:Support Structures Medium] [3:Support Structures N/A] Exudate Amount: [1:Serosanguineous] [2:Serosanguineous] [3:N/A] Exudate Type: [1:red, brown] [2:red, brown] [3:N/A] Exudate Color: [1:Distinct, outline attached] [2:Distinct, outline attached] [3:N/A] Wound Margin: [1:Large (67-100%)] [2:Large (67-100%)] [3:N/A] Granulation Amount: [1:Red] [2:Red] [3:N/A] Granulation Quality: [1:None Present (0%)] [2:None Present (0%)] [3:N/A] Necrotic Amount: [1:Fat Layer (Subcutaneous Tissue): Yes Fat Layer (Subcutaneous Tissue): Yes N/A] Exposed  Structures: [1:Fascia: No Tendon: No Muscle: No Joint: No Bone: No Large (67-100%)] [2:Fascia: No Tendon: No Muscle: No Joint: No Bone: No Large (67-100%)] [3:N/A] Treatment Notes Electronic Signature(s) Signed: 05/31/2020 5:03:32 PM By: Kalman Shan DO Signed: 05/31/2020 5:56:30 PM By: Levan [name] RN, BSN Entered By: Kalman Shan on 05/31/2020 16:42:09 -------------------------------------------------------------------------------- [place] Details Patient Name: Date of Service: Harry Foster 05/31/2020 2:45 PM Medical Record Number: [id number] Patient Account Number: [id number] Date of Birth/Sex: Treating RN: 1945-02-27 (75 y.o. 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 Compression as ordered Provide education on venous insufficiency Notes: Wound/Skin Impairment Nursing Diagnoses: Impaired tissue integrity Knowledge deficit related to ulceration/compromised skin integrity Goals: Patient/caregiver will verbalize understanding of skin care regimen Date Initiated: 05/16/2020 Target Resolution Date: 06/17/2020 Goal Status: Active Ulcer/skin breakdown will have a volume reduction of 30% by week 4 Date Initiated:  05/16/2020 Target Resolution Date: 06/17/2020 Goal Status: Active Interventions: Assess patient/caregiver ability to obtain necessary supplies Assess patient/caregiver ability to perform ulcer/skin care regimen upon admission and as needed Assess ulceration(s) every visit Provide education on ulcer and skin care Notes: Electronic Signature(s) Signed: 05/31/2020 5:56:30 PM By: Levan [name] RN, BSN Entered By: Levan [name] on 05/31/2020 17:48:05 -------------------------------------------------------------------------------- Pain Assessment Details Patient Name: Date of Service: Harry Foster 05/31/2020 2:45 PM Medical Record Number: [id number] Patient Account Number: [id number] Date of Birth/Sex: Treating RN: Sep 14, 1945 (75 y.o. [name] Primary Care Paublo Warshawsky: [name] Other Clinician: Referring Jhalil Silvera: Treating Carolos Fecher/Extender: [name] in Treatment: 2 Active Problems Location of Pain Severity and Description of Pain Patient Has Paino No Site Locations Pain Management and Medication Current Pain Management: Electronic Signature(s) Signed: 05/31/2020 5:24:49 PM By: [name] Signed: 05/31/2020 5:56:30 PM By: Levan [name] RN, BSN Entered By: [name] on 05/31/2020 15:14:28 -------------------------------------------------------------------------------- Patient/Caregiver Education Details Patient Name: Date of Service: Harry Foster 5/31/2022andnbsp2:45 PM Medical Record Number: [id number] Patient Account Number: [id number] Date of Birth/Gender: Treating RN: [birth year] (75 y.o. [name] Primary Care Physician: Inda Merlin,  Angela Cox Other Clinician: Referring Physician: Treating Physician/Extender: [name] in Treatment: 2 Education Assessment Education Provided To: Patient Education Topics Provided Wound/Skin Impairment: Methods:  Explain/Verbal Responses: State content correctly Electronic Signature(s) Signed: 05/31/2020 5:56:30 PM By: Levan [name] RN, BSN Entered By: Levan [name] on 05/31/2020 17:48:15 -------------------------------------------------------------------------------- Wound Assessment Details Patient Name: Date of Service: Harry Foster 05/31/2020 2:45 PM Medical Record Number: [id number] Patient Account Number: [id number] Date of Birth/Sex: Treating RN: [birth year] (75 y.o. 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, Lateral Lower Leg Secondary Lymphedema Wounding Event: Blister Etiology: Date Acquired: 05/09/2020 Wound Open Weeks Of Treatment: 2 Status: Clustered Wound: No Comorbid Cataracts, Arrhythmia, Congestive Heart Failure, Hypertension, History: Peripheral Venous Disease, Gout, Osteoarthritis Photos Wound Measurements Length: (cm) 2.5 Width: (cm) 1.2 Depth: (cm) 0.1 Area: (cm) 2.356 Volume: (cm) 0.236 % Reduction in Area: 86.6% % Reduction in Volume: 86.6% Epithelialization: Large (67-100%) Tunneling: No Undermining: No Wound Description Classification: Full Thickness Without Exposed Support Structures Wound Margin: Distinct, outline attached Exudate Amount: Medium Exudate Type: Serosanguineous Exudate Color: red, brown Foul Odor After Cleansing: No Slough/Fibrino No Wound Bed Granulation Amount: Large (67-100%) Exposed Structure Granulation Quality: Red Fascia Exposed: No Necrotic Amount: None Present (0%) Fat Layer (Subcutaneous Tissue) Exposed: Yes Tendon Exposed: No Muscle Exposed: No Joint Exposed: No Bone Exposed: No Treatment Notes Wound #1 (Lower Leg) Wound Laterality: Left, Lateral Cleanser Soap and  Water Discharge Instruction: May shower and wash wound with dial antibacterial soap and water prior to dressing change. Peri-Wound Care Sween Lotion (Moisturizing lotion) Discharge Instruction: Apply moisturizing lotion as directed Topical Primary Dressing Promogran Prisma Matrix, 4.34 (sq in) (silver collagen) Discharge Instruction: Moisten collagen with saline or hydrogel Secondary Dressing Woven Gauze Sponge, Non-Sterile 4x4 in Discharge Instruction: Apply over primary dressing as directed. ABD Pad, 8x10 Discharge Instruction: Apply over primary dressing as directed. Secured With Compression Wrap Kerlix Roll 4.5x3.1 (in/yd) Discharge Instruction: Apply Kerlix and Coban compression as directed. Coban Self-Adherent Wrap 4x5 (in/yd) Discharge Instruction: Apply over Kerlix as directed. Compression Stockings Add-Ons Electronic Signature(s) Signed: 05/31/2020 5:24:49 PM By: [name] Signed: 05/31/2020 5:56:30 PM By: Levan [name] RN, BSN Entered By: [name] on 05/31/2020 17:04:20 -------------------------------------------------------------------------------- Wound Assessment Details Patient Name: Date of Service: Harry Foster 05/31/2020 2:45 PM Medical Record Number: [id number] Patient Account Number: [id number] Date of Birth/Sex: Treating RN: December 19, 1945 (75 y.o. [name] Primary Care Andrius Andrepont: [name] Other Clinician: Referring Lauramae Kneisley: Treating Kenyana Husak/Extender: [name] in Treatment: 2 Wound Status Wound Number: 2 Primary Venous Leg Ulcer Etiology: Wound Location: Right, Medial Lower Leg Secondary Lymphedema Wounding Event: Blister Etiology: Date Acquired: 04/04/2020 Wound Open Weeks Of Treatment: 2 Status: Clustered Wound: No Comorbid Cataracts, Arrhythmia, Congestive Heart Failure, Hypertension, History: Peripheral Venous Disease, Gout, Osteoarthritis Photos Wound  Measurements Length: (cm) 0.3 Width: (cm) 0.3 Depth: (cm) 0.1 Area: (cm) 0.071 Volume: (cm) 0.007 % Reduction in Area: 98.7% % Reduction in Volume: 98.8% Epithelialization: Large (67-100%) Tunneling: No Undermining: No Wound Description Classification: Full Thickness Without Exposed Support Structures Wound Margin: Distinct, outline attached Exudate Amount: Medium Exudate Type: Serosanguineous Exudate Color: red, brown Foul Odor After Cleansing: No Slough/Fibrino No Wound Bed Granulation Amount: Large (67-100%) Exposed Structure Granulation Quality: Red Fascia Exposed: No Necrotic Amount: None Present (0%)  Fat Layer (Subcutaneous Tissue) Exposed: Yes Tendon Exposed: No Muscle Exposed: No Joint Exposed: No Bone Exposed: No Treatment Notes Wound #2 (Lower Leg) Wound Laterality: Right, Medial Cleanser Soap and Water Discharge Instruction: May shower and wash wound with dial antibacterial soap and water prior to dressing change. Peri-Wound Care Sween Lotion (Moisturizing lotion) Discharge Instruction: Apply moisturizing lotion as directed Topical Primary Dressing Promogran Prisma Matrix, 4.34 (sq in) (silver collagen) Discharge Instruction: Moisten collagen with saline or hydrogel Secondary Dressing Woven Gauze Sponge, Non-Sterile 4x4 in Discharge Instruction: Apply over primary dressing as directed. Secured With Compression Wrap Kerlix Roll 4.5x3.1 (in/yd) Discharge Instruction: Apply Kerlix and Coban compression as directed. Coban Self-Adherent Wrap 4x5 (in/yd) Discharge Instruction: Apply over Kerlix as directed. Compression Stockings Add-Ons Electronic Signature(s) Signed: 05/31/2020 5:24:49 PM By: [name] Signed: 05/31/2020 5:56:30 PM By: Levan [name] RN, BSN Entered By: [name] on 05/31/2020 17:04:50 -------------------------------------------------------------------------------- Wound Assessment Details Patient Name: Date of  Service: Harry Foster 05/31/2020 2:45 PM Medical Record Number: [id number] Patient Account Number: [id number] Date of Birth/Sex: Treating RN: 06-Jan-1945 (75 y.o. [name] Primary Care Roni Friberg: [name] Other Clinician: Referring Ruqayyah Lute: Treating Alexismarie Flaim/Extender: [name] in Treatment: 2 Wound Status Wound Number: 3 Primary Etiology: Venous Leg Ulcer Wound Location: Right, Distal, Lateral Lower Leg Secondary Etiology: Lymphedema Wounding Event: Blister Wound Status: Open Date Acquired: 03/07/2020 Weeks Of Treatment: 2 Clustered Wound: No Wound Measurements Length: (cm) Width: (cm) Depth: (cm) Area: (cm) Volume: (cm) 0 % Reduction in Area: 100% 0 % Reduction in Volume: 100% 0 0 0 Wound Description Classification: Full Thickness Without Exposed Support Structur es Electronic Signature(s) Signed: 05/31/2020 5:24:49 PM By: [name] Signed: 05/31/2020 5:56:30 PM By: Levan [name] RN, BSN Entered By: [name] on 05/31/2020 15:31:12 -------------------------------------------------------------------------------- Vitals Details Patient Name: Date of Service: Harry Foster. 05/31/2020 2:45 PM Medical Record Number: [id number] Patient Account Number: [id number] Date of Birth/Sex: Treating RN: 1945/07/14 (75 y.o. [name] Primary Care Saydee Zolman: [name] Other Clinician: Referring Jeriah Corkum: Treating Kazi Montoro/Extender: [name] in Treatment: 2 Vital Signs Time Taken: 15:14 Temperature (F): 98.2 Height (in): 71 Pulse (bpm): 75 Weight (lbs): 295 Respiratory Rate (breaths/min): 20 Body Mass Index (BMI): 41.1 Blood Pressure (mmHg): 94/54 Reference Range: 80 - 120 mg / dl Electronic Signature(s) Signed: 05/31/2020 5:24:49 PM By: [name] Entered By: [name] on 05/31/2020 15:14:23

## 2020-06-01 NOTE — Progress Notes (Signed)
Harry, Foster ([id number]) Visit Report for 05/16/2020 Chief Complaint Document Details Patient Name: Date of Service: Harry Foster 05/16/2020 2:45 PM Medical Record Number: [id number] Patient Account Number: [id number] Date of Birth/Sex: Treating RN: 1945-04-17 (75 y.o. Harry Foster Primary Care Provider: Henrine Foster Other Clinician: Referring Provider: Treating Provider/Extender: Harry Foster in Treatment: 0 Information Obtained from: Patient Chief Complaint Bilateral lower extremity wounds Electronic Signature(s) Signed: 05/16/2020 4:38:21 PM By: Kalman Shan DO Entered By: Kalman Shan on 05/16/2020 16:30:06 -------------------------------------------------------------------------------- Debridement Details Patient Name: Date of Service: Harry Foster 05/16/2020 2:45 PM Medical Record Number: [id number] Patient Account Number: [id number] Date of Birth/Sex: Treating RN: March 21, 1945 (75 y.o. Harry Foster Primary Care Provider: Henrine Foster Other Clinician: Referring Provider: Treating Provider/Extender: Harry Foster in Treatment: 0 Debridement Performed for Assessment: Wound #1 Left,Lateral Lower Leg Performed By: Clinician Harry [name], RN Debridement Type: Chemical/Enzymatic/Mechanical Agent Used: Anasept and gauze to remove biofilm Severity of Tissue Pre Debridement: Fat layer exposed Level of Consciousness (Pre-procedure): Awake and Alert Pre-procedure Verification/Time Out No Taken: Bleeding: None Response to Treatment: Procedure was tolerated well Level of Consciousness (Post- Awake and Alert procedure): Post Debridement Measurements of Total Wound Length: (cm) 5.1 Width: (cm) 4.4 Depth: (cm) 0.1 Volume: (cm) 1.762 Character of Wound/Ulcer Post Debridement: Improved Severity of Tissue Post Debridement: Fat layer exposed Post Procedure Diagnosis Same as  Pre-procedure Electronic Signature(s) Signed: 05/16/2020 4:38:21 PM By: Kalman Shan DO Signed: 05/18/2020 6:30:22 PM By: Harry [name] RN, BSN Entered By: Harry Foster on 05/16/2020 16:31:57 -------------------------------------------------------------------------------- Debridement Details Patient Name: Date of Service: Harry Foster 05/16/2020 2:45 PM Medical Record Number: [id number] Patient Account Number: [id number] Date of Birth/Sex: Treating RN: [birth year] (75 y.o. Harry Foster Primary Care Provider: Henrine Foster Other Clinician: Referring Provider: Treating Provider/Extender: Harry Foster in Treatment: 0 Debridement Performed for Assessment: Wound #2 Right,Medial Lower Leg Performed By: Clinician Harry [name], RN Debridement Type: Chemical/Enzymatic/Mechanical Agent Used: Anasept and gauze to remove biofilm Severity of Tissue Pre Debridement: Fat layer exposed Level of Consciousness (Pre-procedure): Awake and Alert Pre-procedure Verification/Time Out No Taken: Start Time: 16:30 Bleeding: None Response to Treatment: Procedure was tolerated well Level of Consciousness (Post- Awake and Alert procedure): Post Debridement Measurements of Total Wound Length: (cm) 1.7 Width: (cm) 4.2 Depth: (cm) 0.1 Volume: (cm) 0.561 Character of Wound/Ulcer Post Debridement: Improved Severity of Tissue Post Debridement: Fat layer exposed Post Procedure Diagnosis Same as Pre-procedure Electronic Signature(s) Signed: 05/16/2020 4:38:21 PM By: Kalman Shan DO Signed: 05/18/2020 6:30:22 PM By: Harry [name] RN, BSN Entered By: Harry Foster on 05/16/2020 16:32:36 -------------------------------------------------------------------------------- Debridement Details Patient Name: Date of Service: Harry Foster 05/16/2020 2:45 PM Medical Record Number: [id number] Patient Account Number: [id number] Date of Birth/Sex: Treating  RN: 03-17-1945 (75 y.o. Harry Foster Primary Care Provider: Henrine Foster Other Clinician: Referring Provider: Treating Provider/Extender: Harry Foster in Treatment: 0 Debridement Performed for Assessment: Wound #3 Right,Distal,Lateral Lower Leg Performed By: Clinician Harry [name], RN Debridement Type: Chemical/Enzymatic/Mechanical Agent Used: Anasept and gauze to remove biofilm Severity of Tissue Pre Debridement: Fat layer exposed Level of Consciousness (Pre-procedure): Awake and Alert Pre-procedure Verification/Time Out No Taken: Start Time: 16:30 Bleeding: None Response to Treatment: Procedure was tolerated well Level of Consciousness (Post- Level of Consciousness (Post- Awake and Alert procedure): Post Debridement Measurements of Total Wound Length: (cm) 1.4 Width: (cm)  3.7 Depth: (cm) 0.1 Volume: (cm) 0.407 Character of Wound/Ulcer Post Debridement: Improved Severity of Tissue Post Debridement: Fat layer exposed Post Procedure Diagnosis Same as Pre-procedure Electronic Signature(s) Signed: 05/16/2020 4:38:21 PM By: Kalman Shan DO Signed: 05/18/2020 6:30:22 PM By: Harry [name] RN, BSN Entered By: Harry Foster on 05/16/2020 16:33:04 -------------------------------------------------------------------------------- Debridement Details Patient Name: Date of Service: Harry Foster 05/16/2020 2:45 PM Medical Record Number: [id number] Patient Account Number: [id number] Date of Birth/Sex: Treating RN: 12/19/1945 (75 y.o. Harry Foster Primary Care Provider: Henrine Foster Other Clinician: Referring Provider: Treating Provider/Extender: Harry Foster in Treatment: 0 Debridement Performed for Assessment: Wound #4 Right,Anterior Lower Leg Performed By: Clinician Harry [name], RN Debridement Type: Chemical/Enzymatic/Mechanical Agent Used: Anasept and gauze to remove  biofilm Severity of Tissue Pre Debridement: Fat layer exposed Level of Consciousness (Pre-procedure): Awake and Alert Pre-procedure Verification/Time Out No Taken: Start Time: 16:30 Bleeding: None Response to Treatment: Procedure was tolerated well Level of Consciousness (Post- Awake and Alert procedure): Post Debridement Measurements of Total Wound Length: (cm) 3.5 Width: (cm) 4 Depth: (cm) 0.1 Volume: (cm) 1.1 Character of Wound/Ulcer Post Debridement: Improved Severity of Tissue Post Debridement: Fat layer exposed Post Procedure Diagnosis Same as Pre-procedure Electronic Signature(s) Signed: 05/16/2020 4:38:21 PM By: Kalman Shan DO Signed: 05/18/2020 6:30:22 PM By: Harry [name] RN, BSN Entered By: Harry Foster on 05/16/2020 16:33:30 -------------------------------------------------------------------------------- HPI Details Patient Name: Date of Service: Harry Foster. 05/16/2020 2:45 PM Medical Record Number: [id number] Patient Account Number: [id number] Date of Birth/Sex: Treating RN: Jul 13, 1945 (75 y.o. Harry Foster Primary Care Provider: Henrine Foster Other Clinician: Referring Provider: Treating Provider/Extender: Harry Foster in Treatment: 0 History of Present Illness HPI Description: Harry Foster is a 75 year old male with a past medical history of persistent atrial fibrillation on Coumadin, hypertension, gout, CHF And venous insufficiency that presents to the clinic for bilateral lower extremity wounds. He states he has a history of blistering to his legs that eventually pop and heal. He states that in the past 6 Foster he has been unable to heal his wounds. He keeps them wrapped however they soak through quickly and has to change the wraps frequently. 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 He sleeps in a recliner at night and keeps his legs down due to increased pain  when they are elevated. He states this symptom started a few Foster ago. He denies signs of infection. Electronic Signature(s) Signed: 05/16/2020 4:38:21 PM By: Kalman Shan DO Entered By: Kalman Shan on 05/16/2020 16:32:18 -------------------------------------------------------------------------------- Physical Exam Details Patient Name: Date of Service: Harry Foster 05/16/2020 2:45 PM Medical Record Number: [id number] Patient Account Number: [id number] Date of Birth/Sex: Treating RN: 1945/03/26 (75 y.o. Harry Foster Primary Care Provider: Henrine Foster Other Clinician: Referring Provider: Treating Provider/Extender: Harry Foster in Treatment: 0 Constitutional respirations regular, non-labored and within target range for patient.Marland Kitchen Psychiatric pleasant and cooperative. Notes Due to swelling unable to palpate pedal pulses. He has 3+ pitting edema to the knees bilaterally. Venous stasis dermatitis bilaterally. He has scattered open wounds to his legs bilaterally limited to skin breakdown. There are no signs of infection. He has some blisters scattered throughout as well. Electronic Signature(s) Signed: 05/16/2020 4:38:21 PM By: Kalman Shan DO Entered By: Kalman Shan on 05/16/2020 16:33:12 -------------------------------------------------------------------------------- Physician Orders Details Patient Name: Date of Service: JULIA N, HA  RRY G. 05/16/2020 2:45 PM Medical Record Number: [id number] Patient Account Number: [id number] Date of Birth/Sex: Treating RN: Jun 21, 1945 (75 y.o. 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 - with Dr. Heber Winfield Return A Nurse Visit: Raynelle Dick or Friday for rewrap Bathing/ Shower/ Hygiene May shower with protection but do not get wound dressing(s) wet. - use cast protector Edema Control - Lymphedema / SCD / Other Bilateral Lower Extremities Elevate legs to the level of the heart or above for 30 minutes daily and/or when sitting, a frequency of: - throughout the day Avoid standing for long periods of time. Exercise regularly Wound Treatment Wound #1 - Lower Leg Wound Laterality: Left, Lateral Cleanser: Soap and Water 1 x Per Week/7 Days Discharge Instructions: May shower and wash wound with dial antibacterial soap and water prior to dressing change. Peri-Wound Care: Sween Lotion (Moisturizing lotion) 1 x Per Week/7 Days Discharge Instructions: Apply moisturizing lotion as directed Prim Dressing: KerraCel Ag Gelling Fiber Dressing, 4x5 in (silver alginate) 1 x Per Week/7 Days ary Discharge Instructions: Apply silver alginate to wound bed as instructed Secondary Dressing: ABD Pad, 8x10 1 x Per Week/7 Days Discharge Instructions: Apply over primary dressing as directed. Secondary Dressing: Zetuvit Plus 4x8 in 1 x Per Week/7 Days Discharge Instructions: Apply over primary dressing as directed. Compression Wrap: Kerlix Roll 4.5x3.1 (in/yd) 1 x Per Week/7 Days Discharge Instructions: Apply Kerlix and Coban compression as directed. Compression Wrap: Coban Self-Adherent Wrap 4x5 (in/yd) 1 x Per  Week/7 Days Discharge Instructions: Apply over Kerlix as directed. Compression Stockings: Jobst Farrow Wrap 4000 (DME) Left Leg Compression Amount: 20-30 mmHG Discharge Instructions: Apply Francia Greaves daily as instructed. Apply first thing in the morning, remove at night before bed. Wound #2 - Lower Leg Wound Laterality: Right, Medial Cleanser: Soap and Water 1 x Per Week Discharge Instructions: May shower and wash wound with dial antibacterial soap and water prior to dressing change. Peri-Wound Care: Sween Lotion (Moisturizing lotion) 1 x Per Week Discharge Instructions: Apply moisturizing lotion as directed Prim Dressing: KerraCel Ag Gelling Fiber Dressing, 4x5 in (silver alginate) 1 x Per Week ary Discharge Instructions: Apply silver alginate to wound bed as instructed Secondary Dressing: ABD Pad, 8x10 1 x Per Week Discharge Instructions: Apply over primary dressing as directed. Secondary Dressing: Zetuvit Plus 4x8 in 1 x Per Week Discharge Instructions: Apply over primary dressing as directed. Compression Wrap: Kerlix Roll 4.5x3.1 (in/yd) 1 x Per Week Discharge Instructions: Apply Kerlix and Coban compression as directed. Compression Wrap: Coban Self-Adherent Wrap 4x5 (in/yd) 1 x Per Week Discharge Instructions: Apply over Kerlix as directed. Wound #3 - Lower Leg Wound Laterality: Right, Lateral, Distal Cleanser: Soap and Water 1 x Per Week/7 Days Discharge Instructions: May shower and wash wound with dial antibacterial soap and water prior to dressing change. Peri-Wound Care: Sween Lotion (Moisturizing lotion) 1 x Per Week/7 Days Discharge Instructions: Apply moisturizing lotion  as directed Prim Dressing: KerraCel Ag Gelling Fiber Dressing, 4x5 in (silver alginate) 1 x Per Week/7 Days ary Discharge Instructions: Apply silver alginate to wound bed as instructed Secondary Dressing: ABD Pad, 8x10 1 x Per Week/7 Days Discharge Instructions: Apply over primary dressing as  directed. Secondary Dressing: Zetuvit Plus 4x8 in 1 x Per Week/7 Days Discharge Instructions: Apply over primary dressing as directed. Compression Wrap: Kerlix Roll 4.5x3.1 (in/yd) 1 x Per Week/7 Days Discharge Instructions: Apply Kerlix and Coban compression as directed. Compression Wrap: Coban Self-Adherent Wrap 4x5 (in/yd) 1 x Per Week/7 Days Discharge Instructions: Apply over Kerlix as directed. Compression Stockings: Jobst Farrow Wrap 4000 (DME) Right Leg Compression Amount: 20-30 mmHG Discharge Instructions: Apply Francia Greaves daily as instructed. Apply first thing in the morning, remove at night before bed. Wound #4 - Lower Leg Wound Laterality: Right, Anterior Cleanser: Soap and Water 1 x Per Week/7 Days Discharge Instructions: May shower and wash wound with dial antibacterial soap and water prior to dressing change. Peri-Wound Care: Sween Lotion (Moisturizing lotion) 1 x Per Week/7 Days Discharge Instructions: Apply moisturizing lotion as directed Prim Dressing: KerraCel Ag Gelling Fiber Dressing, 4x5 in (silver alginate) 1 x Per Week/7 Days ary Discharge Instructions: Apply silver alginate to wound bed as instructed Secondary Dressing: ABD Pad, 8x10 1 x Per Week/7 Days Discharge Instructions: Apply over primary dressing as directed. Secondary Dressing: Zetuvit Plus 4x8 in 1 x Per Week/7 Days Discharge Instructions: Apply over primary dressing as directed. Compression Wrap: Kerlix Roll 4.5x3.1 (in/yd) 1 x Per Week/7 Days Discharge Instructions: Apply Kerlix and Coban compression as directed. Compression Wrap: Coban Self-Adherent Wrap 4x5 (in/yd) 1 x Per Week/7 Days Discharge Instructions: Apply over Kerlix as directed. Services and Therapies Arterial Studies- Bilateral w/ABIs and TBIs Electronic Signature(s) Signed: 05/16/2020 4:38:21 PM By: Kalman Shan DO Signed: 05/18/2020 6:30:22 PM By: Harry [name] RN, BSN Entered By: Harry Foster on 05/16/2020 16:37:05 Prescription  05/16/2020 -------------------------------------------------------------------------------- Karle Plumber Kalman Shan DO Patient Name: Provider: Nov 12, 1945 [id number] Date of Birth: NPI#: Jerilynn Mages [id number] Sex: DEA #: [phone] Phone #: License #: [place] Patient Address: [address], [place] [address] [place], [place] [address] [phone] Allergies lisinopril; Quinolones Provider's Orders Arterial Studies- Bilateral w/ABIs and TBIs Hand Signature: Date(s): Electronic Signature(s) Signed: 05/16/2020 4:38:21 PM By: Kalman Shan DO Signed: 05/18/2020 6:30:22 PM By: Harry [name] RN, BSN Entered By: Harry Foster on 05/16/2020 16:37:05 -------------------------------------------------------------------------------- Problem List Details Patient Name: Date of Service: Harry Foster 05/16/2020 2:45 PM Medical Record Number: [id number] Patient Account Number: [id number] Date of Birth/Sex: Treating RN: 1945-04-11 (75 y.o. 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 with 05/16/2020 No Yes unspecified severity L97.829 Non-pressure chronic ulcer of other part of left lower leg with unspecified 05/16/2020 No Yes severity I87.2 Venous insufficiency (chronic) (peripheral) 05/16/2020 No Yes I48.19 Other persistent atrial fibrillation 05/16/2020 No Yes Z79.01 Long term (current) use of anticoagulants 05/16/2020 No Yes I50.40 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 05/16/2020 No Yes failure M1A.9XX1 Chronic gout, unspecified, with tophus (tophi) 05/16/2020 No  Yes N18.4 Chronic kidney disease, stage 4 (severe) 05/16/2020 No Yes Inactive Problems Resolved Problems Electronic Signature(s) Signed: 05/16/2020 4:38:21 PM By: Kalman Shan  DO Entered By: Kalman Shan on 05/16/2020 16:29:34 -------------------------------------------------------------------------------- Progress Note Details Patient Name: Date of Service: Harry Foster 05/16/2020 2:45 PM Medical Record Number: [id number] Patient Account Number: [id number] Date of Birth/Sex: Treating RN: [birth year] (75 y.o. Harry Foster Primary Care Provider: Henrine Foster Other Clinician: Referring Provider: Treating Provider/Extender: Harry Foster in Treatment: 0 Subjective Chief Complaint Information obtained from Patient Bilateral lower extremity wounds History of Present Illness (HPI) Harry Foster is a 75 year old male with a past medical history of persistent atrial fibrillation on Coumadin, hypertension, gout, CHF And venous insufficiency that presents to the clinic for bilateral lower extremity wounds. He states he has a history of blistering to his legs that eventually pop and heal. He states that in the past 6 Foster he has been unable to heal his wounds. He keeps them wrapped however they soak through quickly and has to change the wraps frequently. 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 He sleeps in a recliner at night and keeps his legs down due to increased pain when they are elevated. He states this symptom started a few Foster ago. He denies signs of infection. Patient History Information obtained from Patient. Allergies lisinopril (Reaction: cough), Quinolones (Reaction: possible tendinitis) Family History Cancer - Mother,Siblings, Heart Disease - Father, No family history of Diabetes, Hereditary Spherocytosis, Hypertension, Kidney Disease, Lung Disease, Seizures, Stroke, Thyroid  Problems, Tuberculosis. Social History Never smoker, Marital Status - Widowed, Alcohol Use - Never, Drug Use - No History, Caffeine Use - Daily - coffee. Medical History Eyes Patient has history of Cataracts - mild Denies history of Glaucoma, Optic Neuritis Ear/Nose/Mouth/Throat Denies history of Chronic sinus problems/congestion, Middle ear problems Cardiovascular Patient has history of Arrhythmia - afib, Congestive Heart Failure, Hypertension, Peripheral Venous Disease Endocrine Denies history of Type I Diabetes, Type II Diabetes Genitourinary Denies history of End Stage Renal Disease Integumentary (Skin) Denies history of History of Burn Musculoskeletal Patient has history of Gout, Osteoarthritis Denies history of Rheumatoid Arthritis, Osteomyelitis Oncologic Denies history of Received Chemotherapy, Received Radiation Psychiatric Denies history of Anorexia/bulimia, Confinement Anxiety Hospitalization/Surgery History - bil knee replacements. Medical A Surgical History Notes nd Constitutional Symptoms (General Health) morbid obesity Genitourinary CKD stage 3 Review of Systems (ROS) Constitutional Symptoms (General Health) Denies complaints or symptoms of Fatigue, Fever, Chills, Marked Weight Change. Eyes Complains or has symptoms of Glasses / Contacts. Denies complaints or symptoms of Dry Eyes, Vision Changes. Ear/Nose/Mouth/Throat Denies complaints or symptoms of Chronic sinus problems or rhinitis. Respiratory Denies complaints or symptoms of Chronic or frequent coughs, Shortness of Breath. Gastrointestinal Denies complaints or symptoms of Frequent diarrhea, Nausea, Vomiting. Endocrine Denies complaints or symptoms of Heat/cold intolerance. Genitourinary Denies complaints or symptoms of Frequent urination. Integumentary (Skin) Complains or has symptoms of Wounds - bil lower legs. Musculoskeletal Complains or has symptoms of Muscle Weakness. Denies complaints or  symptoms of Muscle Pain. Neurologic Complains or has symptoms of Numbness/parasthesias - fingers. Psychiatric Denies complaints or symptoms of Claustrophobia, Suicidal. Objective Constitutional respirations regular, non-labored and within target range for patient.. Vitals Time Taken: 3:23 PM, Height: 71 in, Source: Stated, Weight: 295 lbs, Source: Stated, BMI: 41.1, Temperature: 98.2 F, Pulse: 96 bpm, Respiratory Rate: 18 breaths/min, Blood Pressure: 95/62 mmHg. Psychiatric pleasant and cooperative. General Notes: Due to swelling unable to palpate pedal pulses. He has 3+ pitting edema to the knees bilaterally. Venous stasis dermatitis bilaterally. He has scattered open wounds to  his legs bilaterally limited to skin breakdown. There are no signs of infection. He has some blisters scattered throughout as well. Integumentary (Hair, Skin) Wound #1 status is Open. Original cause of wound was Blister. The date acquired was: 05/09/2020. The wound is located on the Left,Lateral Lower Leg. The wound measures 5.1cm length x 4.4cm width x 0.1cm depth; 17.624cm^2 area and 1.762cm^3 volume. There is Fat Layer (Subcutaneous Tissue) exposed. There is no tunneling or undermining noted. There is a large amount of serous drainage noted. The wound margin is flat and intact. There is large (67-100%) red granulation within the wound bed. There is no necrotic tissue within the wound bed. Wound #2 status is Open. Original cause of wound was Blister. The date acquired was: 04/04/2020. The wound is located on the Right,Medial Lower Leg. The wound measures 1.7cm length x 4.2cm width x 0.1cm depth; 5.608cm^2 area and 0.561cm^3 volume. There is Fat Layer (Subcutaneous Tissue) exposed. There is no tunneling or undermining noted. There is a large amount of serous drainage noted. The wound margin is flat and intact. There is large (67-100%) red granulation within the wound bed. There is no necrotic tissue within the wound  bed. Wound #3 status is Open. Original cause of wound was Blister. The date acquired was: 03/07/2020. The wound is located on the Right,Distal,Lateral Lower Leg. The wound measures 1.4cm length x 3.7cm width x 0.1cm depth; 4.068cm^2 area and 0.407cm^3 volume. There is Fat Layer (Subcutaneous Tissue) exposed. There is no tunneling or undermining noted. There is a medium amount of serous drainage noted. The wound margin is flat and intact. There is large (67-100%) red granulation within the wound bed. There is no necrotic tissue within the wound bed. Wound #4 status is Open. Original cause of wound was Gradually Appeared. The date acquired was: 03/07/2020. The wound is located on the Right,Anterior Lower Leg. The wound measures 3.5cm length x 4cm width x 0.1cm depth; 10.996cm^2 area and 1.1cm^3 volume. There is Fat Layer (Subcutaneous Tissue) exposed. There is no tunneling or undermining noted. There is a medium amount of serous drainage noted. The wound margin is indistinct and nonvisible. There is medium (34-66%) red granulation within the wound bed. There is a medium (34-66%) amount of necrotic tissue within the wound bed including Adherent Slough. 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 Granulation tissue present without signs of infection. His ABIs were noncompressible. I will  send for formal studies. In the meantime we will use Kerlix/Coban bilaterally with silver alginate underneath. I advised the patient that if he has pain to take these off and call our clinic. I will have him follow- up for nurse visit by the end of the week to assure drainage is being controlled. He can follow-up with me in 1 week. 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 There was a Chemical/Enzymatic/Mechanical debridement performed by Harry [name], RN.. Other agent used was Anasept and gauze to remove biofilm. There was no bleeding. The procedure was tolerated well. Post Debridement Measurements: 5.1cm length x 4.4cm width x 0.1cm depth; 1.762cm^3 volume. Character of Wound/Ulcer Post Debridement is improved. Severity of Tissue Post Debridement is: Fat layer exposed. Post procedure Diagnosis Wound #1: Same as Pre-Procedure Wound #2 Pre-procedure diagnosis of Wound #2 is a Venous Leg Ulcer located on the Right,Medial Lower Leg .Severity of Tissue Pre Debridement is: Fat layer exposed. There was a Chemical/Enzymatic/Mechanical debridement performed by Harry [name], RN.. Other agent used was Anasept and gauze to remove biofilm. There was no bleeding. The procedure was tolerated well. Post Debridement Measurements: 1.7cm length x 4.2cm width x 0.1cm depth; 0.561cm^3 volume. Character of Wound/Ulcer Post Debridement is improved. Severity of Tissue Post Debridement is: Fat layer exposed. Post procedure Diagnosis Wound #2: Same as Pre-Procedure Wound #3 Pre-procedure diagnosis of Wound #3 is a Venous Leg Ulcer located on the Right,Distal,Lateral Lower Leg .Severity of Tissue Pre Debridement is: Fat layer exposed. There was a  Chemical/Enzymatic/Mechanical debridement performed by Harry [name], RN.. Other agent used was Anasept and gauze to remove biofilm. There was no bleeding. The procedure was tolerated well. Post Debridement Measurements: 1.4cm length x 3.7cm width x 0.1cm depth; 0.407cm^3 volume. Character of Wound/Ulcer Post Debridement is improved. Severity of Tissue Post Debridement is: Fat layer exposed. Post procedure Diagnosis Wound #3: Same as Pre-Procedure Wound #4 Pre-procedure diagnosis of Wound #4 is a Venous Leg Ulcer located on the Right,Anterior Lower Leg .Severity of Tissue Pre Debridement is: Fat layer exposed. There was a Chemical/Enzymatic/Mechanical debridement performed by Harry [name], RN.. Other agent used was Anasept and gauze to remove biofilm. There was no bleeding. The procedure was tolerated well. Post Debridement Measurements: 3.5cm length x 4cm width x 0.1cm depth; 1.1cm^3 volume. Character of Wound/Ulcer Post Debridement is improved. Severity of Tissue Post Debridement is: Fat layer exposed. Post procedure Diagnosis Wound #4: Same as Pre-Procedure Plan Follow-up Appointments: Return Appointment in 1 week. - with Dr. Heber [place] Nurse Visit: Raynelle Dick or Friday for rewrap Bathing/ Shower/ Hygiene: May shower with protection but do not get wound dressing(s) wet. - use cast protector Edema Control - Lymphedema / SCD / Other: Elevate legs to the level of the heart or above for 30 minutes daily and/or when sitting, a frequency of: - throughout the day Avoid standing for long periods of time. Exercise regularly Services and Therapies ordered were: Arterial Studies- Bilateral w/ABIs and TBIs WOUND #1: - Lower Leg Wound Laterality: Left, Lateral Cleanser: Soap and Water 1 x Per Week/ Discharge Instructions: May shower and wash wound with dial antibacterial soap and water prior to dressing change. Peri-Wound Care: Sween Lotion (Moisturizing lotion) 1 x Per Week/ Discharge Instructions:  Apply moisturizing lotion as directed Prim Dressing: KerraCel Ag Gelling Fiber Dressing, 4x5 in (silver alginate) 1 x Per Week/ ary Discharge Instructions: Apply silver alginate to wound bed as instructed Secondary Dressing: ABD Pad, 8x10 1 x Per Week/ Discharge Instructions: Apply over primary dressing as directed. Secondary Dressing: Zetuvit Plus 4x8 in 1 x Per Week/ Discharge Instructions: Apply over primary dressing as directed. Com pression Wrap: Kerlix Roll 4.5x3.1 (in/yd) 1 x Per Week/ Discharge Instructions: Apply Kerlix and Coban compression as directed. Com pression Wrap: Coban Self-Adherent Wrap 4x5 (in/yd) 1 x Per Week/ Discharge Instructions: Apply over Kerlix as directed. WOUND #2: - Lower Leg  Wound Laterality: Right, Medial Cleanser: Soap and Water 1 x Per Week/ Discharge Instructions: May shower and wash wound with dial antibacterial soap and water prior to dressing change. Peri-Wound Care: Sween Lotion (Moisturizing lotion) 1 x Per Week/ Discharge Instructions: Apply moisturizing lotion as directed Prim Dressing: KerraCel Ag Gelling Fiber Dressing, 4x5 in (silver alginate) 1 x Per Week/ ary Discharge Instructions: Apply silver alginate to wound bed as instructed Secondary Dressing: ABD Pad, 8x10 1 x Per Week/ Discharge Instructions: Apply over primary dressing as directed. Secondary Dressing: Zetuvit Plus 4x8 in 1 x Per Week/ Discharge Instructions: Apply over primary dressing as directed. Com pression Wrap: Kerlix Roll 4.5x3.1 (in/yd) 1 x Per Week/ Discharge Instructions: Apply Kerlix and Coban compression as directed. Com pression Wrap: Coban Self-Adherent Wrap 4x5 (in/yd) 1 x Per Week/ Discharge Instructions: Apply over Kerlix as directed. WOUND #3: - Lower Leg Wound Laterality: Right, Lateral, Distal Cleanser: Soap and Water 1 x Per Week/ Discharge Instructions: May shower and wash wound with dial antibacterial soap and water prior to dressing change. Peri-Wound  Care: Sween Lotion (Moisturizing lotion) 1 x Per Week/ Discharge Instructions: Apply moisturizing lotion as directed Prim Dressing: KerraCel Ag Gelling Fiber Dressing, 4x5 in (silver alginate) 1 x Per Week/ ary Discharge Instructions: Apply silver alginate to wound bed as instructed Secondary Dressing: ABD Pad, 8x10 1 x Per Week/ Discharge Instructions: Apply over primary dressing as directed. Secondary Dressing: Zetuvit Plus 4x8 in 1 x Per Week/ Discharge Instructions: Apply over primary dressing as directed. Com pression Wrap: Kerlix Roll 4.5x3.1 (in/yd) 1 x Per Week/ Discharge Instructions: Apply Kerlix and Coban compression as directed. Com pression Wrap: Coban Self-Adherent Wrap 4x5 (in/yd) 1 x Per Week/ Discharge Instructions: Apply over Kerlix as directed. WOUND #4: - Lower Leg Wound Laterality: Right, Anterior Cleanser: Soap and Water 1 x Per Week/ Discharge Instructions: May shower and wash wound with dial antibacterial soap and water prior to dressing change. Peri-Wound Care: Sween Lotion (Moisturizing lotion) 1 x Per Week/ Discharge Instructions: Apply moisturizing lotion as directed Prim Dressing: KerraCel Ag Gelling Fiber Dressing, 4x5 in (silver alginate) 1 x Per Week/ ary Discharge Instructions: Apply silver alginate to wound bed as instructed Secondary Dressing: ABD Pad, 8x10 1 x Per Week/ Discharge Instructions: Apply over primary dressing as directed. Secondary Dressing: Zetuvit Plus 4x8 in 1 x Per Week/ Discharge Instructions: Apply over primary dressing as directed. Com pression Wrap: Kerlix Roll 4.5x3.1 (in/yd) 1 x Per Week/ Discharge Instructions: Apply Kerlix and Coban compression as directed. Com pression Wrap: Coban Self-Adherent Wrap 4x5 (in/yd) 1 x Per Week/ Discharge Instructions: Apply over Kerlix as directed. 1. Kerlix/Coban with silver alginate underneath 2. Nurse visit by the end of the week 3. Follow-up with me in 1 week 4. ABIs with TBI's Electronic  Signature(s) Signed: 05/16/2020 4:38:21 PM By: Kalman Shan DO Entered By: Kalman Shan on 05/16/2020 16:37:47 -------------------------------------------------------------------------------- HxROS Details Patient Name: Date of Service: Harry Foster 05/16/2020 2:45 PM Medical Record Number: [id number] Patient Account Number: [id number] Date of Birth/Sex: Treating RN: 28-Oct-1945 (75 y.o. [name] Primary Care Provider: Henrine Foster Other Clinician: Referring Provider: Treating Provider/Extender: Harry Foster in Treatment: 0 Information Obtained From Patient Constitutional Symptoms (General Health) Complaints and Symptoms: Negative for: Fatigue; Fever; Chills; Marked Weight Change Medical History: Past Medical History Notes: morbid obesity Eyes Complaints and Symptoms: Positive for: Glasses / Contacts Negative for: Dry Eyes; Vision Changes Medical History: Positive for: Cataracts - mild Negative  for: Glaucoma; Optic Neuritis Ear/Nose/Mouth/Throat Complaints and Symptoms: Negative for: Chronic sinus problems or rhinitis Medical History: Negative for: Chronic sinus problems/congestion; Middle ear problems Respiratory Complaints and Symptoms: Negative for: Chronic or frequent coughs; Shortness of Breath Gastrointestinal Complaints and Symptoms: Negative for: Frequent diarrhea; Nausea; Vomiting Endocrine Complaints and Symptoms: Negative for: Heat/cold intolerance Medical History: Negative for: Type I Diabetes; Type II Diabetes Genitourinary Complaints and Symptoms: Negative for: Frequent urination Medical History: Negative for: End Stage Renal Disease Past Medical History Notes: CKD stage 3 Integumentary (Skin) Complaints and Symptoms: Positive for: Wounds - bil lower legs Medical History: Negative for: History of Burn Musculoskeletal Complaints and Symptoms: Positive for: Muscle Weakness Negative for:  Muscle Pain Medical History: Positive for: Gout; Osteoarthritis Negative for: Rheumatoid Arthritis; Osteomyelitis Neurologic Complaints and Symptoms: Positive for: Numbness/parasthesias - fingers Psychiatric Complaints and Symptoms: Negative for: Claustrophobia; Suicidal Medical History: Negative for: Anorexia/bulimia; Confinement Anxiety Hematologic/Lymphatic Cardiovascular Medical History: Positive for: Arrhythmia - afib; Congestive Heart Failure; Hypertension; Peripheral Venous Disease Immunological Oncologic Medical History: Negative for: Received Chemotherapy; Received Radiation HBO Extended History Items Eyes: Cataracts Immunizations Pneumococcal Vaccine: Received Pneumococcal Vaccination: Yes Implantable Devices No devices added Hospitalization / Surgery History Type of Hospitalization/Surgery bil knee replacements Family and Social History Cancer: Yes - Mother,Siblings; Diabetes: No; Heart Disease: Yes - Father; Hereditary Spherocytosis: No; Hypertension: No; Kidney Disease: No; Lung Disease: No; Seizures: No; Stroke: No; Thyroid Problems: No; Tuberculosis: No; Never smoker; Marital Status - Widowed; Alcohol Use: Never; Drug Use: No History; Caffeine Use: Daily - coffee; Financial Concerns: No; Food, Clothing or Shelter Needs: No; Support System Lacking: No; Transportation Concerns: No Electronic Signature(s) Signed: 05/16/2020 4:38:21 PM By: Kalman Shan DO Signed: 05/16/2020 4:50:33 PM By: Baruch Gouty RN, BSN Entered By: Baruch Gouty on 05/16/2020 15:37:11 -------------------------------------------------------------------------------- [facility] Details Patient Name: Date of Service: Harry Foster 05/16/2020 Medical Record Number: [id number] Patient Account Number: [id number] Date of Birth/Sex: Treating RN: [birth year] (75 y.o. 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, unspecified, 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, stage 4 (severe) Facility Procedures CPT4 Code: [id number] Description: 31594 - WOUND CARE VISIT-LEV 3 EST PT Modifier: 25 Quantity: 1 CPT4 Code: [id number] Description: 46286 - DEBRIDE W/O ANES NON SELECT Modifier: Quantity: 1 Physician Procedures : CPT4 Code Description Modifier [id number] WC PHYS LEVEL 3 NEW PT ICD-10 Diagnosis Description A57.903 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

## 2020-06-01 NOTE — Progress Notes (Signed)
JEDD, [place] ([id number]) Visit Report for 05/31/2020 Chief Complaint Document Details Patient Name: Date of Service: Harry Foster 05/31/2020 2:45 PM Medical Record Number: [id number] Patient Account Number: [id number] Date of Birth/Sex: Treating RN: 09/13/1945 (75 y.o. [name] Primary Care Provider: [name] Other Clinician: Referring Provider: Treating Provider/Extender: [name] in Treatment: 2 Information Obtained from: Patient Chief Complaint Bilateral lower extremity wounds Electronic Signature(s) Signed: 05/31/2020 5:03:32 PM By: Kalman Shan DO Entered By: Kalman Shan on 05/31/2020 16:42:17 -------------------------------------------------------------------------------- HPI Details Patient Name: Date of Service: Harry Foster 05/31/2020 2:45 PM Medical Record Number: [id number] Patient Account Number: [id number] Date of Birth/Sex: Treating RN: 1945/09/07 (75 y.o. [name] Primary Care Provider: [name] Other Clinician: Referring Provider: Treating Provider/Extender: [name] in Treatment: 2 History of Present Illness HPI Description: Mr. Harish Bram III is a 75 year old male with a past medical history of persistent atrial fibrillation on Coumadin, hypertension, gout, CHF And venous insufficiency that presents to the clinic for bilateral lower extremity wounds. He states he has a history of blistering to his legs that eventually pop and heal. He states that in the past 6 weeks he has been unable to heal his wounds. He keeps them wrapped however they soak through quickly and has to change the wraps frequently. 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 He sleeps in a recliner at night and keeps his legs down due to increased pain when they are elevated. He states this symptom started a few weeks ago. He denies  signs of infection. 5/23; patient presents for 1 week follow-up. He had Kerlix/Coban wrap placed at last clinic visit and changed again last week due to increased drainage. He states that there has been an improvement in swelling and drainage over the past week. He is going to have arterial studies done tomorrow. He denies signs of infection. 5/31; patient presents for 1 week follow-up. He has been doing well with Kerlix/Coban wrap. He denies signs of infection. Electronic Signature(s) Signed: 05/31/2020 5:03:32 PM By: Kalman Shan DO Entered By: Kalman Shan on 05/31/2020 16:53:20 -------------------------------------------------------------------------------- Physical Exam Details Patient Name: Date of Service: Harry Foster 05/31/2020 2:45 PM Medical Record Number: [id number] Patient Account Number: [id number] Date of Birth/Sex: Treating RN: 1945/11/08 (75 y.o. [name] Primary Care Provider: [name] Other Clinician: Referring Provider: Treating Provider/Extender: [name] in Treatment: 2 Constitutional respirations regular, non-labored and within target range for patient.Marland Kitchen Psychiatric pleasant and cooperative. Notes Bilateral lower extremities with scattered open wounds limited to skin breakdown. He has 2+ pitting edema to the knees bilaterally. No acute signs of infection. Difficult to palpate pedal pulses. Electronic Signature(s) Signed: 05/31/2020 5:03:32 PM By: Kalman Shan DO Entered By: Kalman Shan on 05/31/2020 16:54:05 -------------------------------------------------------------------------------- Physician Orders Details Patient Name: Date of Service: Harry Foster 05/31/2020 2:45 PM Medical Record Number: [id number] Patient Account Number: [id number] Date of Birth/Sex: Treating RN: 05-03-1945 (75 y.o. 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 - Monday with Dr. Heber L[place] Return A Bathing/ Shower/ Hygiene May shower with protection but do not get wound dressing(s) wet. - use cast protector Edema Control - Lymphedema / SCD / Other Bilateral Lower Extremities Elevate legs to the level of the heart or above for 30 minutes daily and/or when sitting, a frequency of: - throughout the day Avoid standing for long periods of time. Exercise regularly Wound Treatment Wound #1 - Lower Leg Wound Laterality: Left, Lateral Cleanser: Soap and Water 1 x Per Week/7 Days Discharge Instructions: May shower and wash wound with dial antibacterial soap and water prior to dressing change. Peri-Wound Care: Sween Lotion (Moisturizing lotion) 1 x Per Week/7 Days Discharge Instructions: Apply moisturizing lotion as directed Prim Dressing: Promogran Prisma Matrix, 4.34 (sq in) (silver collagen) 1 x Per Week/7 Days ary Discharge Instructions: Moisten collagen with saline or hydrogel Secondary Dressing: Woven Gauze Sponge, Non-Sterile 4x4 in 1 x Per Week/7 Days Discharge Instructions: Apply over primary dressing as directed. Secondary Dressing: ABD Pad, 8x10 1 x Per Week/7 Days Discharge Instructions: Apply over  primary dressing as directed. Compression Wrap: Kerlix Roll 4.5x3.1 (in/yd) 1 x Per Week/7 Days Discharge Instructions: Apply Kerlix and Coban compression as directed. Compression Wrap: Coban Self-Adherent Wrap 4x5 (in/yd) 1 x Per Week/7 Days Discharge Instructions: Apply over Kerlix as directed. Wound #2 - Lower Leg Wound Laterality: Right, Medial Cleanser: Soap and Water 1 x Per Week Discharge Instructions: May shower and wash wound with dial antibacterial soap and water prior to dressing change. Peri-Wound Care: Sween Lotion (Moisturizing lotion) 1 x Per Week Discharge Instructions: Apply moisturizing lotion as directed Prim Dressing: Promogran Prisma Matrix, 4.34 (sq in) (silver collagen) 1 x Per Week ary Discharge Instructions: Moisten collagen with saline or hydrogel Secondary Dressing: Woven Gauze Sponge, Non-Sterile 4x4 in 1 x Per Week Discharge Instructions: Apply over primary dressing as directed. Compression Wrap: Kerlix Roll 4.5x3.1 (in/yd) 1 x Per Week Discharge Instructions: Apply Kerlix and Coban compression as directed. Compression Wrap: Coban Self-Adherent Wrap 4x5 (in/yd) 1 x Per Week Discharge Instructions: Apply over Kerlix as directed. Electronic Signature(s) Signed: 05/31/2020 5:03:32 PM By: Kalman Shan DO Entered By: Kalman Shan on 05/31/2020 16:54:17 -------------------------------------------------------------------------------- Problem List Details Patient Name: Date of Service: Harry Foster 05/31/2020 2:45 PM Medical Record Number: [id number] Patient Account Number: [id number] Date of Birth/Sex: Treating RN: Feb 08, 1945 (75 y.o. [name] Primary Care Provider: [name] Other Clinician: Referring Provider: Treating Provider/Extender: [name] in Treatment: 2 Active Problems ICD-10 Encounter Code Description Active Date MDM Diagnosis L97.919 Non-pressure chronic ulcer of unspecified  part of right lower leg with 05/16/2020 No Yes unspecified severity L97.829 Non-pressure chronic ulcer of other part of left lower leg with unspecified 05/16/2020 No Yes severity I87.2 Venous insufficiency (chronic) (peripheral) 05/16/2020 No Yes I48.19 Other persistent atrial fibrillation 05/16/2020 No Yes Z79.01 Long term (current) use of anticoagulants 05/16/2020 No Yes I50.40 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 05/16/2020 No Yes failure M1A.9XX1 Chronic gout, unspecified, with tophus (tophi) 05/16/2020 No Yes N18.4 Chronic kidney disease, stage 4 (severe) 05/16/2020 No Yes Inactive Problems Resolved Problems Electronic Signature(s) Signed: 05/31/2020 5:03:32 PM By: Kalman Shan DO Entered By: Kalman Shan on 05/31/2020 16:42:05 -------------------------------------------------------------------------------- Progress  Note Details Patient Name: Date of Service: Harry Foster 05/31/2020 2:45 PM Medical Record Number: [id number] Patient Account Number: [id number] Date of Birth/Sex: Treating RN: 1945/05/24 (75 y.o. [name] Primary Care Provider: [name] Other Clinician: Referring Provider: Treating Provider/Extender: [name] in Treatment: 2 Subjective Chief Complaint Information obtained from Patient Bilateral lower extremity wounds History of Present Illness (HPI) Mr. Romario Tith III is a 75 year old male with a past medical history of persistent atrial fibrillation on Coumadin, hypertension, gout, CHF And venous insufficiency that presents to the clinic for bilateral lower extremity wounds. He states he has a history of blistering to his legs that eventually pop and heal. He states that in the past 6 weeks he has been unable to heal his wounds. He keeps them wrapped however they soak through quickly and has to change the wraps frequently. 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 He sleeps in a recliner at night and keeps his legs down due to increased pain when they are elevated. He states this symptom started a few weeks ago. He denies signs of infection. 5/23; patient presents for 1 week follow-up. He had Kerlix/Coban wrap placed at last clinic visit and changed again last week due to increased drainage. He states that there has been an improvement in swelling and drainage over the past week. He is going to have arterial studies done tomorrow. He denies signs of infection. 5/31; patient presents for 1 week follow-up. He has been doing well with Kerlix/Coban wrap. He denies signs of infection. Patient History Information obtained from Patient. Family History Cancer - Mother,Siblings, Heart Disease - Father, No family history of Diabetes, Hereditary Spherocytosis, Hypertension, Kidney Disease, Lung Disease, Seizures, Stroke, Thyroid Problems, Tuberculosis. Social History Never smoker, Marital Status - Widowed, Alcohol Use - Never, Drug Use - No History, Caffeine Use - Daily - coffee. Medical History Eyes Patient has history of Cataracts - mild Denies history of Glaucoma, Optic Neuritis Ear/Nose/Mouth/Throat Denies history of Chronic sinus problems/congestion, Middle ear problems Cardiovascular Patient has history of Arrhythmia - afib, Congestive Heart Failure, Hypertension, Peripheral Venous Disease Endocrine Denies history of Type I Diabetes, Type II Diabetes Genitourinary Denies history of End Stage Renal Disease Integumentary (Skin) Denies history of History of Burn Musculoskeletal Patient has history of Gout, Osteoarthritis Denies history of Rheumatoid Arthritis, Osteomyelitis Oncologic Denies history of Received Chemotherapy, Received Radiation Psychiatric Denies history of Anorexia/bulimia, Confinement Anxiety Hospitalization/Surgery History - bil knee replacements. Medical A Surgical History  Notes nd Constitutional Symptoms (General Health) morbid obesity Genitourinary CKD stage 3 Objective Constitutional respirations regular, non-labored and within target range for patient.. Vitals Time Taken: 3:14 PM, Height: 71 in, Weight: 295 lbs, BMI: 41.1, Temperature: 98.2 F, Pulse: 75 bpm, Respiratory Rate: 20 breaths/min, Blood Pressure: 94/54 mmHg. Psychiatric pleasant and cooperative. General Notes: Bilateral lower extremities with scattered open wounds limited to skin breakdown. He has 2+ pitting edema to the knees bilaterally. No acute signs of infection. Difficult to palpate pedal pulses. Integumentary (Hair, Skin) Wound #1 status is Open. Original cause of wound was Blister. The date acquired was: 05/09/2020. The wound has been in treatment 2 weeks. The wound is located on the Left,Lateral Lower Leg. The wound measures 2.5cm length x 1.2cm width x 0.1cm depth; 2.356cm^2 area and 0.236cm^3 volume. There is Fat Layer (Subcutaneous Tissue) exposed. There is no tunneling or undermining noted. There is a medium amount of serosanguineous drainage noted.  The wound margin is distinct with the outline attached to the wound base. There is large (67-100%) red granulation within the wound bed. There is no necrotic tissue within the wound bed. Wound #2 status is Open. Original cause of wound was Blister. The date acquired was: 04/04/2020. The wound has been in treatment 2 weeks. The wound is located on the Right,Medial Lower Leg. The wound measures 0.3cm length x 0.3cm width x 0.1cm depth; 0.071cm^2 area and 0.007cm^3 volume. There is Fat Layer (Subcutaneous Tissue) exposed. There is no tunneling or undermining noted. There is a medium amount of serosanguineous drainage noted. The wound margin is distinct with the outline attached to the wound base. There is large (67-100%) red granulation within the wound bed. There is no necrotic tissue within the wound bed. Wound #3 status is Open. Original  cause of wound was Blister. The date acquired was: 03/07/2020. The wound has been in treatment 2 weeks. The wound is located on the Right,Distal,Lateral Lower Leg. The wound measures 0cm length x 0cm width x 0cm depth; 0cm^2 area and 0cm^3 volume. 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 The right leg is pretty much closed. The left leg still has a small wound limited to skin breakdown. No signs of infection. We will do silver collagen to all wounds and continue wrapping with Kerlix/Coban. I discussed the results of the ABIs with the patient. I reported that they were noncompressible and TBI's were not reliable. I said since the wounds are improving there is no need for vascular referral at this time. Plan Follow-up Appointments: Return Appointment in 1 week. - Monday with Dr. Heber [place]cairn Bathing/ Shower/ Hygiene: May shower with protection but do not get wound dressing(s) wet. - use cast protector Edema Control - Lymphedema / SCD / Other: Elevate legs to the level of the heart or above for 30 minutes daily and/or when sitting, a frequency of: - throughout the day Avoid standing for long periods of time. Exercise regularly WOUND #1: - Lower Leg Wound Laterality: Left, Lateral Cleanser: Soap and Water 1 x Per Week/7 Days Discharge Instructions: May shower and wash wound with dial antibacterial soap and water prior to dressing change. Peri-Wound Care: Sween Lotion  (Moisturizing lotion) 1 x Per Week/7 Days Discharge Instructions: Apply moisturizing lotion as directed Prim Dressing: Promogran Prisma Matrix, 4.34 (sq in) (silver collagen) 1 x Per Week/7 Days ary Discharge Instructions: Moisten collagen with saline or hydrogel Secondary Dressing: Woven Gauze Sponge, Non-Sterile 4x4 in 1 x Per Week/7 Days Discharge Instructions: Apply over primary dressing as directed. Secondary Dressing: ABD Pad, 8x10 1 x Per Week/7 Days Discharge Instructions: Apply over primary dressing as directed. Com pression Wrap: Kerlix Roll 4.5x3.1 (in/yd) 1 x Per Week/7 Days Discharge Instructions: Apply Kerlix and Coban compression as directed. Com pression Wrap: Coban Self-Adherent Wrap 4x5 (in/yd) 1 x Per Week/7 Days Discharge Instructions: Apply over Kerlix as directed. WOUND #2: - Lower Leg Wound Laterality: Right, Medial Cleanser: Soap and Water 1 x Per Week/ Discharge Instructions: May shower and wash wound with dial antibacterial soap and water prior to dressing change. Peri-Wound Care:  Sween Lotion (Moisturizing lotion) 1 x Per Week/ Discharge Instructions: Apply moisturizing lotion as directed Prim Dressing: Promogran Prisma Matrix, 4.34 (sq in) (silver collagen) 1 x Per Week/ ary Discharge Instructions: Moisten collagen with saline or hydrogel Secondary Dressing: Woven Gauze Sponge, Non-Sterile 4x4 in 1 x Per Week/ Discharge Instructions: Apply over primary dressing as directed. Com pression Wrap: Kerlix Roll 4.5x3.1 (in/yd) 1 x Per Week/ Discharge Instructions: Apply Kerlix and Coban compression as directed. Com pression Wrap: Coban Self-Adherent Wrap 4x5 (in/yd) 1 x Per Week/ Discharge Instructions: Apply over Kerlix as directed. 1. Silver collagen 2. Kerlix/Coban 3. Follow-up in 1 week Electronic Signature(s) Signed: 05/31/2020 5:03:32 PM By: Kalman Shan DO Entered By: Kalman Shan on 05/31/2020  17:02:17 -------------------------------------------------------------------------------- HxROS Details Patient Name: Date of Service: Harry Foster 05/31/2020 2:45 PM Medical Record Number: [id number] Patient Account Number: [id number] Date of Birth/Sex: Treating RN: [birth year] (75 y.o. [name] Primary Care Provider: [name] Other Clinician: Referring Provider: Treating Provider/Extender: [name] in Treatment: 2 Information Obtained From Patient Constitutional Symptoms (General Health) Medical History: Past Medical History Notes: morbid obesity Eyes Medical History: Positive for: Cataracts - mild Negative for: Glaucoma; Optic Neuritis Ear/Nose/Mouth/Throat Medical History: Negative for: Chronic sinus problems/congestion; Middle ear problems Cardiovascular Medical History: Positive for: Arrhythmia - afib; Congestive Heart Failure; Hypertension; Peripheral Venous Disease Endocrine Medical History: Negative for: Type I Diabetes; Type II Diabetes Genitourinary Medical History: Negative for: End Stage Renal Disease Past Medical History Notes: CKD stage 3 Integumentary (Skin) Medical History: Negative for: History of Burn Musculoskeletal Medical History: Positive for: Gout; Osteoarthritis Negative for: Rheumatoid Arthritis; Osteomyelitis Oncologic Medical History: Negative for: Received Chemotherapy; Received Radiation Psychiatric Medical History: Negative for: Anorexia/bulimia; Confinement Anxiety HBO Extended History Items Eyes: Cataracts Immunizations Pneumococcal Vaccine: Received Pneumococcal Vaccination: Yes Implantable Devices No devices added Hospitalization / Surgery History Type of Hospitalization/Surgery bil knee replacements Family and Social History Cancer: Yes - Mother,Siblings; Diabetes: No; Heart Disease: Yes - Father; Hereditary Spherocytosis: No; Hypertension: No; Kidney Disease:  No; Lung Disease: No; Seizures: No; Stroke: No; Thyroid Problems: No; Tuberculosis: No; Never smoker; Marital Status - Widowed; Alcohol Use: Never; Drug Use: No History; Caffeine Use: Daily - coffee; Financial Concerns: No; Food, Clothing or Shelter Needs: No; Support System Lacking: No; Transportation Concerns: No Electronic Signature(s) Signed: 05/31/2020 5:03:32 PM By: Kalman Shan DO Signed: 05/31/2020 5:56:30 PM By: Levan [name] RN, BSN Entered By: Kalman Shan on 05/31/2020 16:53:27 -------------------------------------------------------------------------------- SuperBill Details Patient Name: Date of Service: Harry Foster 05/31/2020 Medical Record Number: [id number] Patient Account Number: [id number] Date of Birth/Sex: Treating RN: [birth year] (75 y.o. 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 L97.829 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ity I87.2 Venous insufficiency (chronic) (peripheral) I48.19 Other persistent atrial fibrillation Z79.01 Long term (current) use of anticoagulants I50.40 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 failure M1A.9XX1 Chronic gout, unspecified, 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, stage 4 (severe) Facility Procedures CPT4 Code: 16073710 Description: 99213 - WOUND CARE VISIT-LEV 3 EST PT Modifier: Quantity: 1 Physician Procedures : CPT4 Code Description Modifier [id number] - WC PHYS LEVEL 3 - EST PT ICD-10 Diagnosis Description L97.919 Non-pressure chronic ulcer of unspecified part of right  lower leg with unspecified severity L97.829 Non-pressure chronic  ulcer of other part of  left lower leg with unspecified severity I87.2 Venous insufficiency (chronic) (peripheral) Quantity: 1 Electronic Signature(s) Signed: 05/31/2020 5:56:30 PM By: Levan [name] RN, BSN Signed: 06/01/2020 9:53:33 AM By: Kalman Shan DO Previous Signature: 05/31/2020 5:03:32 PM Version By: Kalman Shan DO Entered By: Levan [name] on 05/31/2020 17:48:55

## 2020-06-01 NOTE — Progress Notes (Signed)
Harry, [name] ([id number]) Visit Report for 05/23/2020 Chief Complaint Document Details Patient Name: Date of Service: Harry Foster 05/23/2020 3:30 PM Medical Record Number: [id number] Patient Account Number: [id number] Date of Birth/Sex: Treating RN: Oct 02, 1945 (75 y.o. Harry Foster Primary Care Provider: Henrine Foster Other Clinician: Referring Provider: Treating Provider/Extender: Harry Foster in Treatment: 1 Information Obtained from: Patient Chief Complaint Bilateral lower extremity wounds Electronic Signature(s) Signed: 05/23/2020 5:05:23 PM By: Harry Shan DO Entered By: Harry Foster on 05/23/2020 17:01:04 -------------------------------------------------------------------------------- HPI Details Patient Name: Date of Service: Harry Foster 05/23/2020 3:30 PM Medical Record Number: [id number] Patient Account Number: [id number] Date of Birth/Sex: Treating RN: 1945-04-16 (75 y.o. Harry Foster Primary Care Provider: Henrine Foster Other Clinician: Referring Provider: Treating Provider/Extender: Harry Foster in Treatment: 1 History of Present Illness HPI Description: Harry Foster is a 75 year old male with a past medical history of persistent atrial fibrillation on Coumadin, hypertension, gout, CHF And venous insufficiency that presents to the clinic for bilateral lower extremity wounds. He states he has a history of blistering to his legs that eventually pop and heal. He states that in the past 6 Foster he has been unable to heal his wounds. He keeps them wrapped however they soak through quickly and has to change the wraps frequently. 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 He sleeps in a recliner at night and keeps his legs down due to increased pain when they are elevated. He states this symptom started a few Foster ago. He denies  signs of infection. 5/23; patient presents for 1 week follow-up. He had Kerlix/Coban wrap placed at last clinic visit and changed again last week due to increased drainage. He states that there has been an improvement in swelling and drainage over the past week. He is going to have arterial studies done tomorrow. He denies signs of infection. Electronic Signature(s) Signed: 05/23/2020 5:05:23 PM By: Harry Shan DO Entered By: Harry Foster on 05/23/2020 17:01:50 -------------------------------------------------------------------------------- Physical Exam Details Patient Name: Date of Service: Harry Foster 05/23/2020 3:30 PM Medical Record Number: [id number] Patient Account Number: [id number] Date of Birth/Sex: Treating RN: Nov 04, 1945 (75 y.o. Harry Foster Primary Care Provider: Henrine Foster Other Clinician: Referring Provider: Treating Provider/Extender: Harry Foster in Treatment: 1 Constitutional respirations regular, non-labored and within target range for patient.Marland Kitchen Psychiatric pleasant and cooperative. Notes Bilateral lower extremities with scattered open wounds limited to skin breakdown. The anterior right leg wound has healed. He has 2+ pitting edema to the knees bilaterally. No acute signs of infection. Difficult to palpate pedal pulses. Electronic Signature(s) Signed: 05/23/2020 5:05:23 PM By: Harry Shan DO Entered By: Harry Foster on 05/23/2020 17:02:51 -------------------------------------------------------------------------------- Physician Orders Details Patient Name: Date of Service: Harry Foster 05/23/2020 3:30 PM Medical Record Number: [id number] Patient Account Number: [id number] Date of Birth/Sex: Treating RN: 25-Feb-1945 (75 y.o. 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 - with Dr. Heber [place] Return A Nurse Visit: - Tuesday after arterial study Bathing/ Shower/ Hygiene May shower with protection but do not get wound dressing(s) wet. - use cast protector Edema Control - Lymphedema / SCD / Other Bilateral Lower Extremities Elevate legs to the level of the heart or above for 30 minutes daily and/or when sitting, a frequency of: - throughout the day Avoid standing for long periods of time. Exercise regularly Wound Treatment Wound #1 - Lower Leg Wound Laterality: Left, Lateral Cleanser: Soap and Water 1 x Per Week/7 Days Discharge Instructions: May shower and wash wound with dial antibacterial soap and water prior to dressing change. Peri-Wound Care: Sween Lotion (Moisturizing lotion) 1 x Per Week/7 Days Discharge Instructions: Apply moisturizing lotion as directed Prim Dressing: Hydrofera Blue Ready Foam, 4x5 in ary 1 x Per Week/7 Days Discharge Instructions: Apply to wound bed as instructed Secondary Dressing: ABD Pad, 8x10 1 x Per Week/7 Days Discharge Instructions: Apply over primary dressing as directed. Secondary Dressing: Zetuvit Plus 4x8 in 1 x Per Week/7 Days Discharge Instructions: Apply over primary dressing as directed. Compression Wrap: Kerlix Roll 4.5x3.1 (in/yd) 1 x Per  Week/7 Days Discharge Instructions: Apply Kerlix and Coban compression as directed. Compression Wrap: Coban Self-Adherent Wrap 4x5 (in/yd) 1 x Per Week/7 Days Discharge Instructions: Apply over Kerlix as directed. Wound #2 - Lower Leg Wound Laterality: Right, Medial Cleanser: Soap and Water 1 x Per Week Discharge Instructions: May shower and wash wound with dial antibacterial soap and water prior to dressing change. Peri-Wound Care: Sween Lotion (Moisturizing lotion) 1 x Per Week Discharge Instructions: Apply moisturizing lotion as directed Prim Dressing: KerraCel Ag Gelling Fiber Dressing, 4x5 in (silver alginate) 1 x Per Week ary Discharge Instructions: Apply silver alginate to wound bed as instructed Secondary Dressing: ABD Pad, 8x10 1 x Per Week Discharge Instructions: Apply over primary dressing as directed. Secondary Dressing: Zetuvit Plus 4x8 in 1 x Per Week Discharge Instructions: Apply over primary dressing as directed. Compression Wrap: Kerlix Roll 4.5x3.1 (in/yd) 1 x Per Week Discharge Instructions: Apply Kerlix and Coban compression as directed. Compression Wrap: Coban Self-Adherent Wrap 4x5 (in/yd) 1 x Per Week Discharge Instructions: Apply over Kerlix as directed. Wound #3 - Lower Leg Wound Laterality: Right, Lateral, Distal Cleanser: Soap and Water 1 x Per Week/7 Days Discharge Instructions: May shower and wash wound with dial antibacterial soap and water prior to dressing change. Peri-Wound Care: Sween Lotion (Moisturizing lotion) 1 x Per Week/7 Days Discharge Instructions: Apply moisturizing lotion as directed Prim Dressing: KerraCel Ag Gelling Fiber Dressing, 4x5 in (silver alginate) 1 x Per Week/7 Days ary Discharge Instructions: Apply silver alginate to wound bed as instructed Secondary Dressing: ABD Pad, 8x10 1 x Per Week/7 Days Discharge Instructions: Apply over primary dressing as directed. Secondary Dressing: Zetuvit Plus 4x8 in 1 x Per Week/7 Days Discharge  Instructions: Apply over primary dressing as directed. Compression Wrap: Kerlix Roll 4.5x3.1 (in/yd) 1 x Per Week/7 Days Discharge Instructions: Apply Kerlix and Coban compression as directed. Compression Wrap: Coban Self-Adherent Wrap 4x5 (in/yd) 1 x Per Week/7 Days Discharge Instructions: Apply over Kerlix as directed. Electronic Signature(s) Signed: 05/23/2020 5:05:23 PM By: Harry Shan DO Entered By: Harry Foster on 05/23/2020 17:03:19 -------------------------------------------------------------------------------- Problem List Details Patient Name: Date of Service: [name] 05/23/2020 3:30 PM Medical Record  Number: [id number] Patient Account Number: [id number] Date of Birth/Sex: Treating RN: 1945/07/02 (75 y.o. 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 with 05/16/2020 No Yes unspecified severity L97.829 Non-pressure chronic ulcer of other part of left lower leg with unspecified 05/16/2020 No Yes severity I87.2 Venous insufficiency (chronic) (peripheral) 05/16/2020 No Yes I48.19 Other persistent atrial fibrillation 05/16/2020 No Yes Z79.01 Long term (current) use of anticoagulants 05/16/2020 No Yes I50.40 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 05/16/2020 No Yes failure M1A.9XX1 Chronic gout, unspecified, with tophus (tophi) 05/16/2020 No Yes N18.4 Chronic kidney disease, stage 4 (severe) 05/16/2020 No Yes Inactive Problems Resolved Problems Electronic Signature(s) Signed: 05/23/2020 5:05:23 PM By: Harry Shan DO Entered By: Harry Foster on 05/23/2020  17:00:45 -------------------------------------------------------------------------------- Progress Note Details Patient Name: Date of Service: Harry Foster 05/23/2020 3:30 PM Medical Record Number: [id number] Patient Account Number: [id number] Date of Birth/Sex: Treating RN: 12-Feb-1945 (75 y.o. Harry Foster Primary Care Provider: Henrine Foster Other Clinician: Referring Provider: Treating Provider/Extender: Harry Foster in Treatment: 1 Subjective Chief Complaint Information obtained from Patient Bilateral lower extremity wounds History of Present Illness (HPI) Mr. Kail Fraley Foster is a 75 year old male with a past medical history of persistent atrial fibrillation on Coumadin, hypertension, gout, CHF And venous insufficiency that presents to the clinic for bilateral lower extremity wounds. He states he has a history of blistering to his legs that eventually pop and heal. He states that in the past 6 Foster he has been unable to heal his wounds. He keeps them wrapped however they soak through quickly and has to change the wraps frequently. 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 He sleeps in a recliner at night and keeps his legs down due to increased pain when they are elevated. He states this symptom started a few Foster ago. He denies signs of infection. 5/23; patient presents for 1 week follow-up. He had Kerlix/Coban wrap placed at last clinic visit and changed again last week due to increased drainage. He states that there has been an improvement in swelling and drainage over the past week. He is going to have arterial studies done tomorrow. He denies signs of infection. Patient History Information obtained from Patient. Family History Cancer - Mother,Siblings, Heart Disease - Father, No family history of Diabetes, Hereditary Spherocytosis, Hypertension, Kidney Disease, Lung Disease, Seizures,  Stroke, Thyroid Problems, Tuberculosis. Social History Never smoker, Marital Status - Widowed, Alcohol Use - Never, Drug Use - No History, Caffeine Use - Daily - coffee. Medical History Eyes Patient has history of Cataracts - mild Denies history of Glaucoma, Optic Neuritis Ear/Nose/Mouth/Throat Denies history of Chronic sinus problems/congestion, Middle ear problems Cardiovascular Patient has history of Arrhythmia - afib, Congestive Heart Failure, Hypertension, Peripheral Venous Disease Endocrine Denies history of Type I Diabetes, Type II Diabetes Genitourinary Denies history of End Stage Renal Disease Integumentary (Skin) Denies history of History of Burn Musculoskeletal Patient has history of Gout, Osteoarthritis Denies history of Rheumatoid Arthritis, Osteomyelitis Oncologic Denies history of Received Chemotherapy, Received Radiation Psychiatric Denies history of Anorexia/bulimia, Confinement Anxiety Hospitalization/Surgery History - bil knee replacements. Medical A Surgical History Notes nd Constitutional Symptoms (General Health) morbid obesity Genitourinary CKD stage 3 Objective Constitutional respirations regular, non-labored  and within target range for patient.. Vitals Time Taken: 3:45 PM, Height: 71 in, Weight: 295 lbs, BMI: 41.1, Temperature: 98.3 F, Pulse: 48 bpm, Respiratory Rate: 20 breaths/min, Blood Pressure: 96/60 mmHg. Psychiatric pleasant and cooperative. General Notes: Bilateral lower extremities with scattered open wounds limited to skin breakdown. The anterior right leg wound has healed. He has 2+ pitting edema to the knees bilaterally. No acute signs of infection. Difficult to palpate pedal pulses. Integumentary (Hair, Skin) Wound #1 status is Open. Original cause of wound was Blister. The date acquired was: 05/09/2020. The wound has been in treatment 1 Foster. The wound is located on the Left,Lateral Lower Leg. The wound measures 4.9cm length x 3.5cm  width x 0.1cm depth; 13.47cm^2 area and 1.347cm^3 volume. There is Fat Layer (Subcutaneous Tissue) exposed. There is no tunneling or undermining noted. There is a medium amount of serosanguineous drainage noted. The wound margin is distinct with the outline attached to the wound base. There is large (67-100%) red granulation within the wound bed. There is a small (1-33%) amount of necrotic tissue within the wound bed including Adherent Slough. Wound #2 status is Open. Original cause of wound was Blister. The date acquired was: 04/04/2020. The wound has been in treatment 1 Foster. The wound is located on the Right,Medial Lower Leg. The wound measures 1cm length x 3cm width x 0.1cm depth; 2.356cm^2 area and 0.236cm^3 volume. There is Fat Layer (Subcutaneous Tissue) exposed. There is no tunneling or undermining noted. There is a medium amount of serosanguineous drainage noted. The wound margin is distinct with the outline attached to the wound base. There is large (67-100%) red granulation within the wound bed. There is a small (1-33%) amount of necrotic tissue within the wound bed including Adherent Slough. Wound #3 status is Open. Original cause of wound was Blister. The date acquired was: 03/07/2020. The wound has been in treatment 1 Foster. The wound is located on the Right,Distal,Lateral Lower Leg. The wound measures 1.2cm length x 2cm width x 0.1cm depth; 1.885cm^2 area and 0.188cm^3 volume. There is Fat Layer (Subcutaneous Tissue) exposed. There is no tunneling or undermining noted. There is a medium amount of serous drainage noted. The wound margin is distinct with the outline attached to the wound base. There is large (67-100%) red granulation within the wound bed. There is no necrotic tissue within the wound bed. Wound #4 status is Healed - Epithelialized. Original cause of wound was Gradually Appeared. The date acquired was: 03/07/2020. The wound has been in treatment 1 Foster. The wound is located on  the Right,Anterior Lower Leg. The wound measures 0cm length x 0cm width x 0cm depth; 0cm^2 area and 0cm^3 volume. 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 The anterior right leg wound has healed. I will continue silver alginate to all the wounds except for the left anterior leg as this was caused by trauma and did not respond greatly to the silver alginate. We will change this 1 to [facility]. We will continue with Kerlix/Coban until his arterial studies are done. I am hopeful that I can increase compression. There were no signs  of infection today on exam. I will have him follow-up in a week. Plan Follow-up Appointments: Return Appointment in 1 week. - with Dr. Heber [place] Nurse Visit: - Tuesday after arterial study Bathing/ Shower/ Hygiene: May shower with protection but do not get wound dressing(s) wet. - use cast protector Edema Control - Lymphedema / SCD / Other: Elevate legs to the level of the heart or above for 30 minutes daily and/or when sitting, a frequency of: - throughout the day Avoid standing for long periods of time. Exercise regularly WOUND #1: - Lower Leg Wound Laterality: Left, Lateral Cleanser: Soap and Water 1 x Per Week/7 Days Discharge Instructions: May shower and wash wound with dial antibacterial soap and water prior to dressing change. Peri-Wound Care: Sween Lotion (Moisturizing lotion) 1 x Per Week/7 Days Discharge Instructions: Apply moisturizing  lotion as directed Prim Dressing: Hydrofera Blue Ready Foam, 4x5 in 1 x Per Week/7 Days ary Discharge Instructions: Apply to wound bed as instructed Secondary Dressing: ABD Pad, 8x10 1 x Per Week/7 Days Discharge Instructions: Apply over primary dressing as directed. Secondary Dressing: Zetuvit Plus 4x8 in 1 x Per Week/7 Days Discharge Instructions: Apply over primary dressing as directed. Com pression Wrap: Kerlix Roll 4.5x3.1 (in/yd) 1 x Per Week/7 Days Discharge Instructions: Apply Kerlix and Coban compression as directed. Com pression Wrap: Coban Self-Adherent Wrap 4x5 (in/yd) 1 x Per Week/7 Days Discharge Instructions: Apply over Kerlix as directed. WOUND #2: - Lower Leg Wound Laterality: Right, Medial Cleanser: Soap and Water 1 x Per Week/ Discharge Instructions: May shower and wash wound with dial antibacterial soap and water prior to dressing change. Peri-Wound Care: Sween Lotion (Moisturizing lotion) 1 x Per Week/ Discharge Instructions: Apply moisturizing lotion as directed Prim Dressing: KerraCel Ag Gelling Fiber Dressing, 4x5 in (silver alginate) 1 x Per Week/ ary Discharge Instructions: Apply silver alginate to wound bed as instructed Secondary Dressing: ABD Pad, 8x10 1 x Per Week/ Discharge Instructions: Apply over primary dressing as directed. Secondary Dressing: Zetuvit Plus 4x8 in 1 x Per Week/ Discharge Instructions: Apply over primary dressing as directed. Com pression Wrap: Kerlix Roll 4.5x3.1 (in/yd) 1 x Per Week/ Discharge Instructions: Apply Kerlix and Coban compression as directed. Com pression Wrap: Coban Self-Adherent Wrap 4x5 (in/yd) 1 x Per Week/ Discharge Instructions: Apply over Kerlix as directed. WOUND #3: - Lower Leg Wound Laterality: Right, Lateral, Distal Cleanser: Soap and Water 1 x Per Week/7 Days Discharge Instructions: May shower and wash wound with dial antibacterial soap and water prior to dressing change. Peri-Wound Care: Sween Lotion  (Moisturizing lotion) 1 x Per Week/7 Days Discharge Instructions: Apply moisturizing lotion as directed Prim Dressing: KerraCel Ag Gelling Fiber Dressing, 4x5 in (silver alginate) 1 x Per Week/7 Days ary Discharge Instructions: Apply silver alginate to wound bed as instructed Secondary Dressing: ABD Pad, 8x10 1 x Per Week/7 Days Discharge Instructions: Apply over primary dressing as directed. Secondary Dressing: Zetuvit Plus 4x8 in 1 x Per Week/7 Days Discharge Instructions: Apply over primary dressing as directed. Compression Wrap: Kerlix Roll 4.5x3.1 (in/yd) 1 x Per Week/7 Days Discharge Instructions: Apply Kerlix and Coban compression as directed. Compression Wrap: Coban Self-Adherent Wrap 4x5 (in/yd) 1 x Per Week/7 Days Discharge Instructions: Apply over Kerlix as directed. 1. Kerlix/Coban bilaterally with silver alginate underneath all wounds except for the left anterior this will have Hydrofera Blue. 2. Follow-up in 1 week 3. ABIs Electronic Signature(s) Signed: 05/23/2020 5:05:23 PM By: Harry Shan DO Entered By: Harry Foster on 05/23/2020 17:05:00 -------------------------------------------------------------------------------- HxROS Details  Patient Name: Date of Service: Harry Foster 05/23/2020 3:30 PM Medical Record Number: [id number] Patient Account Number: [id number] Date of Birth/Sex: Treating RN: September 19, 1945 (75 y.o. Harry Foster Primary Care Provider: Henrine Foster Other Clinician: Referring Provider: Treating Provider/Extender: Harry Foster in Treatment: 1 Information Obtained From Patient Constitutional Symptoms (General Health) Medical History: Past Medical History Notes: morbid obesity Eyes Medical History: Positive for: Cataracts - mild Negative for: Glaucoma; Optic Neuritis Ear/Nose/Mouth/Throat Medical History: Negative for: Chronic sinus problems/congestion; Middle ear  problems Cardiovascular Medical History: Positive for: Arrhythmia - afib; Congestive Heart Failure; Hypertension; Peripheral Venous Disease Endocrine Medical History: Negative for: Type I Diabetes; Type II Diabetes Genitourinary Medical History: Negative for: End Stage Renal Disease Past Medical History Notes: CKD stage 3 Integumentary (Skin) Medical History: Negative for: History of Burn Musculoskeletal Medical History: Positive for: Gout; Osteoarthritis Negative for: Rheumatoid Arthritis; Osteomyelitis Oncologic Medical History: Negative for: Received Chemotherapy; Received Radiation Psychiatric Medical History: Negative for: Anorexia/bulimia; Confinement Anxiety HBO Extended History Items Eyes: Cataracts Immunizations Pneumococcal Vaccine: Received Pneumococcal Vaccination: Yes Implantable Devices No devices added Hospitalization / Surgery History Type of Hospitalization/Surgery bil knee replacements Family and Social History Cancer: Yes - Mother,Siblings; Diabetes: No; Heart Disease: Yes - Father; Hereditary Spherocytosis: No; Hypertension: No; Kidney Disease: No; Lung Disease: No; Seizures: No; Stroke: No; Thyroid Problems: No; Tuberculosis: No; Never smoker; Marital Status - Widowed; Alcohol Use: Never; Drug Use: No History; Caffeine Use: Daily - coffee; Financial Concerns: No; Food, Clothing or Shelter Needs: No; Support System Lacking: No; Transportation Concerns: No Electronic Signature(s) Signed: 05/23/2020 5:05:23 PM By: Harry Shan DO Signed: 05/23/2020 5:27:53 PM By: Levan [name] RN, BSN Entered By: Harry Foster on 05/23/2020 17:02:00 -------------------------------------------------------------------------------- [place] Details Patient Name: Date of Service: Harry Foster 05/23/2020 Medical Record Number: [id number] Patient Account Number: [id number] Date of Birth/Sex: Treating RN: [birth year] (75 y.o. 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

## 2020-06-02 DIAGNOSIS — M1A9XX1 Chronic gout, unspecified, with tophus (tophi): Secondary | ICD-10-CM | POA: Diagnosis not present

## 2020-06-07 ENCOUNTER — Other Ambulatory Visit: Payer: Self-pay

## 2020-06-07 ENCOUNTER — Encounter (HOSPITAL_BASED_OUTPATIENT_CLINIC_OR_DEPARTMENT_OTHER): Payer: Medicare Other | Attending: Internal Medicine | Admitting: Internal Medicine

## 2020-06-07 DIAGNOSIS — I4819 Other persistent atrial fibrillation: Secondary | ICD-10-CM | POA: Insufficient documentation

## 2020-06-07 DIAGNOSIS — L97829 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ity: Secondary | ICD-10-CM | POA: Diagnosis not present

## 2020-06-07 DIAGNOSIS — N184 Chronic kidney disease, stage 4 (severe): Secondary | ICD-10-CM | POA: Insufficient documentation

## 2020-06-07 DIAGNOSIS — Z7901 Long term (current) use of anticoagulants: Secondary | ICD-10-CM | POA: Insufficient documentation

## 2020-06-07 DIAGNOSIS — L97819 Non-pressure chronic ulcer of other part of right lower leg with unspecified severity: Secondary | ICD-10-CM | POA: Diagnosis not present

## 2020-06-07 DIAGNOSIS — L97919 Non-pressure chronic ulcer of unspecified part of right lower leg with unspecified severity: Secondary | ICD-10-CM

## 2020-06-07 DIAGNOSIS — M1A9XX1 Chronic gout, unspecified, with tophus (tophi): Secondary | ICD-10-CM | POA: Insufficient documentation

## 2020-06-07 DIAGNOSIS — I872 Venous insufficiency (chronic) (peripheral): Secondary | ICD-10-CM | POA: Insufficient documentation

## 2020-06-07 DIAGNOSIS — I504 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 failure: Secondary | ICD-10-CM | POA: Insufficient documentation

## 2020-06-07 DIAGNOSIS — I130 Hypertensive heart and chronic kidney disease with heart failure and stage 1 through stage 4 chronic kidney disease, or unspecified chronic kidney disease: Secondary | ICD-10-CM | POA: Insufficient documentation

## 2020-06-07 NOTE — Progress Notes (Signed)
Harry, Foster ([id number]) Visit Report for 06/07/2020 Chief Complaint Document Details Patient Name: Date of Service: Harry Foster 06/07/2020 1:30 PM Medical Record Number: [id number] Patient Account Number: [id number] Date of Birth/Sex: Treating RN: 1945/12/26 (75 y.o. [name], [name] Primary Care Provider: [name] Other Clinician: Referring Provider: Treating Provider/Extender: [name] in Treatment: 3 Information Obtained from: Patient Chief Complaint Bilateral lower extremity wounds Electronic Signature(s) Signed: 06/07/2020 2:41:00 PM By: Kalman Shan DO Entered By: Kalman Shan on 06/07/2020 14:36:48 -------------------------------------------------------------------------------- HPI Details Patient Name: Date of Service: Harry Leventhal G. 06/07/2020 1:30 PM Medical Record Number: [id number] Patient Account Number: [id number] Date of Birth/Sex: Treating RN: Jun 17, 1945 (75 y.o. [name], [name] Primary Care Provider: [name] Other Clinician: Referring Provider: Treating Provider/Extender: [name] in Treatment: 3 History of Present Illness HPI Description: Mr. Shedrick Sarli III is a 75 year old male with a past medical history of persistent atrial fibrillation on Coumadin, hypertension, gout, CHF And venous insufficiency that presents to the clinic for bilateral lower extremity wounds. He states he has a history of blistering to his legs that eventually pop and heal. He states that in the past 6 weeks he has been unable to heal his wounds. He keeps them wrapped however they soak through quickly and has to change the wraps frequently. 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 He sleeps in a recliner at night and keeps his legs down due to increased pain when they are elevated. He states this symptom started a few weeks ago. He  denies signs of infection. 5/23; patient presents for 1 week follow-up. He had Kerlix/Coban wrap placed at last clinic visit and changed again last week due to increased drainage. He states that there has been an improvement in swelling and drainage over the past week. He is going to have arterial studies done tomorrow. He denies signs of infection. 5/31; patient presents for 1 week follow-up. He has been doing well with Kerlix/Coban wrap. He denies signs of infection. 6/7; patient presents for 1 week follow-up. He has been using Kerlix/Coban wraps bilaterally with collagen underneath. He has tolerated these well. He denies signs of infection. The right leg no longer has wounds. He has his Physicist, medical) Signed: 06/07/2020 2:41:00 PM By: Kalman Shan DO Entered By: Kalman Shan on 06/07/2020 14:37:23 -------------------------------------------------------------------------------- Physical Exam Details Patient Name: Date of Service: Harry Leventhal G. 06/07/2020 1:30 PM Medical Record Number: [id number] Patient Account Number: [id number] Date of Birth/Sex: Treating RN: 1945-09-12 (75 y.o. Erie Noe Primary Care Provider: [name] Other Clinician: Referring Provider: Treating Provider/Extender: [name] in Treatment: 3 Constitutional respirations regular, non-labored and within target range for patient.Marland Kitchen Psychiatric pleasant and cooperative. Notes Right lower extremity with epithelialization to previous wound sites. The left lower extremity has 1 small open wound limited to skin breakdown. There are no signs of infection. He has excellent edema control. Electronic Signature(s) Signed: 06/07/2020 2:41:00 PM By: Kalman Shan DO Entered By: Kalman Shan on 06/07/2020 14:38:17 -------------------------------------------------------------------------------- Physician Orders Details Patient Name:  Date of Service: Harry Leventhal G. 06/07/2020 1:30 PM Medical Record Number: [id number] Patient Account Number: [id number] Date of Birth/Sex: Treating RN: [birth year] (75 y.o. 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 - Monday with Dr. Heber [place] Return A Nurse Visit: - in 1 week for rewrap Bathing/ Shower/ Hygiene May shower with protection but do not get wound dressing(s) wet. - use cast protector Edema Control - Lymphedema / SCD / Other Bilateral Lower Extremities Elevate legs to the level of the heart or above for 30 minutes daily and/or when sitting, a frequency of: - throughout the day Avoid standing for long periods of time. Exercise regularly Moisturize legs daily. - right leg nightly after removing stocking Compression stocking or Garment 20-30 mm/Hg pressure to: - Farrow wrap to right leg daily, apply first thing in the morning and remove at bedtime Wound Treatment Wound #1 - Lower Leg Wound Laterality: Left, Lateral Cleanser: Soap and Water 1 x Per Week/7 Days Discharge Instructions: May shower and wash wound with dial antibacterial soap and water prior to dressing change. Peri-Wound Care:  Sween Lotion (Moisturizing lotion) 1 x Per Week/7 Days Discharge Instructions: Apply moisturizing lotion as directed Prim Dressing: Promogran Prisma Matrix, 4.34 (sq in) (silver collagen) 1 x Per Week/7 Days ary Discharge Instructions: Moisten collagen with saline or hydrogel Secondary Dressing: Woven Gauze Sponge, Non-Sterile 4x4 in 1 x Per Week/7 Days Discharge Instructions: Apply over primary dressing as directed. Compression Wrap: Kerlix Roll 4.5x3.1 (in/yd) 1 x Per Week/7 Days Discharge Instructions: Apply Kerlix and Coban compression as directed. Compression Wrap: Coban Self-Adherent Wrap 4x5 (in/yd) 1 x Per Week/7 Days Discharge Instructions: Apply over Kerlix as directed. Electronic Signature(s) Signed: 06/07/2020 2:41:00 PM By: Kalman Shan DO Entered By: Kalman Shan on 06/07/2020 14:38:44 -------------------------------------------------------------------------------- Problem List Details Patient Name: Date of Service: Harry Leventhal G. 06/07/2020 1:30 PM Medical Record Number: [id number] Patient Account Number: [id number] Date of Birth/Sex: Treating RN: 1945/05/16 (75 y.o. 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 with 05/16/2020 No Yes unspecified severity L97.829 Non-pressure chronic ulcer of other part of left lower leg with unspecified 05/16/2020 No Yes severity I87.2 Venous insufficiency (chronic) (peripheral) 05/16/2020 No Yes I48.19 Other persistent atrial fibrillation 05/16/2020 No Yes Z79.01 Long term (current) use of anticoagulants 05/16/2020 No Yes I50.40 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 05/16/2020 No  Yes failure M1A.9XX1 Chronic gout, unspecified, with tophus (tophi) 05/16/2020 No Yes N18.4 Chronic kidney disease, stage 4 (severe) 05/16/2020 No Yes Inactive Problems Resolved Problems Electronic Signature(s) Signed: 06/07/2020 2:41:00 PM By: Kalman Shan DO Entered By: Kalman Shan on 06/07/2020 14:36:25 -------------------------------------------------------------------------------- Progress Note Details Patient Name: Date of Service: Harry Leventhal G. 06/07/2020 1:30 PM Medical Record Number: [id number] Patient Account Number: [id number] Date of Birth/Sex: Treating RN: 1945/11/04 (75 y.o. Erie Noe Primary Care Provider: [name] Other Clinician: Referring Provider: Treating Provider/Extender: [name] in Treatment: 3 Subjective Chief Complaint Information obtained from Patient Bilateral  lower extremity wounds History of Present Illness (HPI) Mr. Edgar Reisz III is a 75 year old male with a past medical history of persistent atrial fibrillation on Coumadin, hypertension, gout, CHF And venous insufficiency that presents to the clinic for bilateral lower extremity wounds. He states he has a history of blistering to his legs that eventually pop and heal. He states that in the past 6 weeks he has been unable to heal his wounds. He keeps them wrapped however they soak through quickly and has to change the wraps frequently. 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 He sleeps in a recliner at night and keeps his legs down due to increased pain when they are elevated. He states this symptom started a few weeks ago. He denies signs of infection. 5/23; patient presents for 1 week follow-up. He had Kerlix/Coban wrap placed at last clinic visit and changed again last week due to increased drainage. He states that there has been an improvement in swelling and drainage over the past week. He is  going to have arterial studies done tomorrow. He denies signs of infection. 5/31; patient presents for 1 week follow-up. He has been doing well with Kerlix/Coban wrap. He denies signs of infection. 6/7; patient presents for 1 week follow-up. He has been using Kerlix/Coban wraps bilaterally with collagen underneath. He has tolerated these well. He denies signs of infection. The right leg no longer has wounds. He has his Ferra wraps today Patient History Information obtained from Patient. Family History Cancer - Mother,Siblings, Heart Disease - Father, No family history of Diabetes, Hereditary Spherocytosis, Hypertension, Kidney Disease, Lung Disease, Seizures, Stroke, Thyroid Problems, Tuberculosis. Social History Never smoker, Marital Status - Widowed, Alcohol Use - Never, Drug Use - No History, Caffeine Use - Daily - coffee. Medical History Eyes Patient has history of Cataracts - mild Denies history of Glaucoma, Optic Neuritis Ear/Nose/Mouth/Throat Denies history of Chronic sinus problems/congestion, Middle ear problems Cardiovascular Patient has history of Arrhythmia - afib, Congestive Heart Failure, Hypertension, Peripheral Venous Disease Endocrine Denies history of Type I Diabetes, Type II Diabetes Genitourinary Denies history of End Stage Renal Disease Integumentary (Skin) Denies history of History of Burn Musculoskeletal Patient has history of Gout, Osteoarthritis Denies history of Rheumatoid Arthritis, Osteomyelitis Oncologic Denies history of Received Chemotherapy, Received Radiation Psychiatric Denies history of Anorexia/bulimia, Confinement Anxiety Hospitalization/Surgery History - bil knee replacements. Medical A Surgical History Notes nd Constitutional Symptoms (General Health) morbid obesity Genitourinary CKD stage 3 Objective Constitutional respirations regular, non-labored and within target range for patient.. Vitals Time Taken: 1:46 PM, Height: 71 in,  Weight: 295 lbs, BMI: 41.1, Temperature: 98.0 F, Pulse: 74 bpm, Respiratory Rate: 20 breaths/min, Blood Pressure: 104/70 mmHg. Psychiatric pleasant and cooperative. General Notes: Right lower extremity with epithelialization to previous wound sites. The left lower extremity has 1 small open wound limited to skin breakdown. There are no signs of infection. He has excellent edema control. Integumentary (Hair, Skin) Wound #1 status is Open. Original cause of wound was Blister. The date acquired was: 05/09/2020. The wound has been in treatment 3 weeks. The wound is located on the Left,Lateral Lower Leg. The wound measures 0.4cm length x 0.3cm width x 0.1cm depth; 0.094cm^2 area and 0.009cm^3 volume. There is Fat Layer (Subcutaneous Tissue) exposed. There is no tunneling or undermining noted. There is a medium amount of serosanguineous drainage noted. The wound margin is distinct with the outline attached to the wound base. There is large (67-100%) red granulation within  the wound bed. There is no necrotic tissue within the wound bed. Wound #2 status is Healed - Epithelialized. Original cause of wound was Blister. The date acquired was: 04/04/2020. The wound has been in treatment 3 weeks. The wound is located on the Right,Medial Lower Leg. The wound measures 0cm length x 0cm width x 0cm depth; 0cm^2 area and 0cm^3 volume. 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 We will place the  Farrow wrap on this 1. His left lower extremity continues to have 1 small open wound limited to skin breakdown. There are no signs of infection. We will continue with collagen underneath Kerlix Coban. He will follow-up next week for nurse visit and then with me in 2 weeks. Plan Follow-up Appointments: Return Appointment in 2 weeks. - Monday with Dr. Heber [place] Nurse Visit: - in 1 week for rewrap Bathing/ Shower/ Hygiene: May shower with protection but do not get wound dressing(s) wet. - use cast protector Edema Control - Lymphedema / SCD / Other: Elevate legs to the level of the heart or above for 30 minutes daily and/or when sitting, a frequency of: - throughout the day Avoid standing for long periods of time. Exercise regularly Moisturize legs daily. - right leg nightly after removing stocking Compression stocking or Garment 20-30 mm/Hg pressure to: - Farrow wrap to right leg daily, apply first thing in the morning and remove at bedtime WOUND #1: - Lower Leg Wound Laterality: Left, Lateral Cleanser: Soap and Water 1 x Per Week/7 Days Discharge Instructions: May shower and wash wound with dial antibacterial soap and water prior to dressing change. Peri-Wound Care: Sween Lotion (Moisturizing lotion) 1 x Per Week/7 Days Discharge Instructions: Apply moisturizing lotion as directed Prim Dressing: Promogran Prisma Matrix, 4.34 (sq in) (silver collagen) 1 x Per Week/7 Days ary Discharge Instructions: Moisten collagen with saline or hydrogel Secondary Dressing: Woven Gauze Sponge, Non-Sterile 4x4 in 1 x Per Week/7 Days Discharge Instructions: Apply over primary dressing as directed. Com pression Wrap: Kerlix Roll 4.5x3.1 (in/yd) 1 x Per Week/7 Days Discharge Instructions: Apply Kerlix and Coban compression as directed. Com pression Wrap: Coban Self-Adherent Wrap 4x5 (in/yd) 1 x Per Week/7 Days Discharge Instructions: Apply over Kerlix as directed. 1. Collagen and Kerlix/Coban to the left lower  extremity 2. Follow-up with a nurse visit in 1 week in 2 weeks with me Electronic Signature(s) Signed: 06/07/2020 2:41:00 PM By: Kalman Shan DO Entered By: Kalman Shan on 06/07/2020 14:39:41 -------------------------------------------------------------------------------- HxROS Details Patient Name: Date of Service: Harry Leventhal G. 06/07/2020 1:30 PM Medical Record Number: [id number] Patient Account Number: [id number] Date of Birth/Sex: Treating RN: [birth year] (75 y.o. [name], [name] Primary Care Provider: [name] Other Clinician: Referring Provider: Treating Provider/Extender: [name] in Treatment: 3 Information Obtained From Patient Constitutional Symptoms (General Health) Medical History: Past Medical History Notes: morbid obesity Eyes Medical History: Positive for: Cataracts - mild Negative for: Glaucoma; Optic Neuritis Ear/Nose/Mouth/Throat Medical History: Negative for: Chronic sinus problems/congestion; Middle ear problems Cardiovascular Medical History: Positive for: Arrhythmia - afib; Congestive Heart Failure; Hypertension; Peripheral Venous Disease Endocrine Medical History:  Negative for: Type I Diabetes; Type II Diabetes Genitourinary Medical History: Negative for: End Stage Renal Disease Past Medical History Notes: CKD stage 3 Integumentary (Skin) Medical History: Negative for: History of Burn Musculoskeletal Medical History: Positive for: Gout; Osteoarthritis Negative for: Rheumatoid Arthritis; Osteomyelitis Oncologic Medical History: Negative for: Received Chemotherapy; Received Radiation Psychiatric Medical History: Negative for: Anorexia/bulimia; Confinement Anxiety HBO Extended History Items Eyes: Cataracts Immunizations Pneumococcal Vaccine: Received Pneumococcal Vaccination: Yes Implantable Devices No devices added Hospitalization / Surgery History Type of  Hospitalization/Surgery bil knee replacements Family and Social History Cancer: Yes - Mother,Siblings; Diabetes: No; Heart Disease: Yes - Father; Hereditary Spherocytosis: No; Hypertension: No; Kidney Disease: No; Lung Disease: No; Seizures: No; Stroke: No; Thyroid Problems: No; Tuberculosis: No; Never smoker; Marital Status - Widowed; Alcohol Use: Never; Drug Use: No History; Caffeine Use: Daily - coffee; Financial Concerns: No; Food, Clothing or Shelter Needs: No; Support System Lacking: No; Transportation Concerns: No Electronic Signature(s) Signed: 06/07/2020 2:41:00 PM By: Kalman Shan DO Signed: 06/07/2020 5:27:47 PM By: Rhae Hammock RN Entered By: Kalman Shan on 06/07/2020 14:37:30 -------------------------------------------------------------------------------- SuperBill Details Patient Name: Date of Service: Harry Foster 06/07/2020 Medical Record Number: [id number] Patient Account Number: [id number] Date of Birth/Sex: Treating RN: 09/08/1945 (75 y.o. 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 failure M1A.9XX1 Chronic gout, unspecified, 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, stage 4 (severe) Facility Procedures CPT4 Code: [id number] Description: 99214 - WOUND CARE VISIT-LEV 4 EST PT Modifier: Quantity: 1 Physician Procedures : CPT4 Code  Description Modifier [id number] - WC PHYS LEVEL 3 - EST PT ICD-10 Diagnosis Description L97.829 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ity I87.2 Venous insufficiency (chronic) (peripheral) L97.919  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

## 2020-06-08 NOTE — Progress Notes (Signed)
Harry Foster, Harry Foster ([id number]) Visit Report for 06/07/2020 Arrival Information Details Patient Name: Date of Service: Harry Foster 06/07/2020 1:30 PM Medical Record Number: [id number] Patient Account Number: [id number] Date of Birth/Sex: Treating RN: March 18, 1945 (75 y.o. Harry Foster, [name] Primary Care Alyria Krack: [name] Other Clinician: Referring Tamika Shropshire: Treating Julia Alkhatib/Extender: [name] in Treatment: 3 Visit Information History Since Last Visit Added or deleted any medications: No Patient Arrived: Ambulatory Any new allergies or adverse reactions: No Arrival Time: 13:45 Had a fall or experienced change in No Accompanied By: self activities of daily living that may affect Transfer Assistance: None risk of falls: Patient Identification Verified: Yes Signs or symptoms of abuse/neglect since last visito No Secondary Verification Process Completed: Yes Hospitalized since last visit: No Patient Requires Transmission-Based Precautions: No Implantable device outside of the clinic excluding No Patient Has Alerts: Yes cellular tissue based products placed in the center Patient Alerts: R ABI: 1.63 TBI: 1.36 since last visit: L ABI: 1.49 TBI: 1.34 Has Dressing in Place as Prescribed: Yes 05/2020 Pain Present Now: No Electronic Signature(s) Signed: 06/07/2020 2:13:29 PM By: [name] Entered By: [name] on 06/07/2020 13:46:05 -------------------------------------------------------------------------------- Clinic Level of Care Assessment Details Patient Name: Date of Service: Harry Foster 06/07/2020 1:30 PM Medical Record Number: [id number] Patient Account Number: [id number] Date of Birth/Sex: Treating RN: [birth year] (75 y.o. Harry Foster Primary Care Zhi Geier: [name] Other Clinician: Referring Zenna Traister: Treating Cambridge Deleo/Extender: [name] in Treatment:  3 Clinic Level of Care Assessment Items TOOL 4 Quantity Score []  - 0 Use when only an EandM is performed on FOLLOW-UP visit ASSESSMENTS - Nursing Assessment / Reassessment X- 1 10 Reassessment of Co-morbidities (includes updates in patient status) X- 1 5 Reassessment of Adherence to Treatment Plan ASSESSMENTS - Wound and Skin A ssessment / Reassessment []  - 0 Simple Wound Assessment / Reassessment - one wound X- 2 5 Complex Wound Assessment / Reassessment - multiple wounds []  - 0 Dermatologic / Skin Assessment (not related to wound area) ASSESSMENTS - Focused Assessment X- 2 5 Circumferential Edema Measurements - multi extremities []  - 0 Nutritional Assessment / Counseling / Intervention X- 1 5 Lower Extremity Assessment (monofilament, tuning fork, pulses) []  - 0 Peripheral Arterial Disease Assessment (using hand held doppler) ASSESSMENTS - Ostomy and/or Continence Assessment and Care []  - 0 Incontinence Assessment and Management []  - 0 Ostomy Care Assessment and Management (repouching, etc.) PROCESS - Coordination of Care X - Simple Patient / Family Education for ongoing care 1 15 []  - 0 Complex (extensive) Patient / Family Education for ongoing care X- 1 10 Staff obtains Programmer, systems, Records, T Results / Process Orders est []  - 0 Staff telephones HHA, Nursing Homes / Clarify orders / etc []  - 0 Routine Transfer to another Facility (non-emergent condition) []  - 0 Routine Hospital Admission (non-emergent condition) []  - 0 New Admissions / Biomedical engineer / Ordering NPWT Apligraf, etc. , []  - 0 Emergency Hospital Admission (emergent condition) X- 1 10 Simple Discharge Coordination []  - 0 Complex (extensive) Discharge Coordination PROCESS - Special Needs []  - 0 Pediatric / Minor Patient Management []  - 0 Isolation Patient Management []  - 0 Hearing / Language / Visual special needs []  - 0 Assessment of Community assistance (transportation, D/C planning,  etc.) []  - 0 Additional assistance / Altered mentation []  - 0 Support Surface(s) Assessment (bed, cushion, seat, etc.) INTERVENTIONS - Wound Cleansing / Measurement []  - 0  Simple Wound Cleansing - one wound X- 2 5 Complex Wound Cleansing - multiple wounds X- 1 5 Wound Imaging (photographs - any number of wounds) []  - 0 Wound Tracing (instead of photographs) []  - 0 Simple Wound Measurement - one wound X- 2 5 Complex Wound Measurement - multiple wounds INTERVENTIONS - Wound Dressings X - Small Wound Dressing one or multiple wounds 1 10 []  - 0 Medium Wound Dressing one or multiple wounds []  - 0 Large Wound Dressing one or multiple wounds X- 1 5 Application of Medications - topical []  - 0 Application of Medications - injection INTERVENTIONS - Miscellaneous []  - 0 External ear exam []  - 0 Specimen Collection (cultures, biopsies, blood, body fluids, etc.) []  - 0 Specimen(s) / Culture(s) sent or taken to Lab for analysis []  - 0 Patient Transfer (multiple staff / Civil Service fast streamer / Similar devices) []  - 0 Simple Staple / Suture removal (25 or less) []  - 0 Complex Staple / Suture removal (26 or more) []  - 0 Hypo / Hyperglycemic Management (close monitor of Blood Glucose) []  - 0 Ankle / Brachial Index (ABI) - do not check if billed separately X- 1 5 Vital Signs Has the patient been seen at the hospital within the last three years: Yes Total Score: 120 Level Of Care: New/Established - Level 4 Electronic Signature(s) Signed: 06/08/2020 6:12:56 PM By: Baruch Gouty RN, BSN Entered By: Baruch Gouty on 06/07/2020 14:22:02 -------------------------------------------------------------------------------- Encounter Discharge Information Details Patient Name: Date of Service: Harry Foster. 06/07/2020 1:30 PM Medical Record Number: [id number] Patient Account Number: [id number] Date of Birth/Sex: Treating RN: 07-06-1945 (75 y.o. Harry Foster Primary Care Joliet Mallozzi: [name] Other Clinician: Referring Tennis Mckinnon: Treating Murial Beam/Extender: [name] in Treatment: 3 Encounter Discharge Information Items Discharge Condition: Stable Ambulatory Status: Ambulatory Discharge Destination: Home Transportation: Private Auto Accompanied By: self Schedule Follow-up Appointment: Yes Clinical Summary of Care: Patient Declined Electronic Signature(s) Signed: 06/08/2020 6:12:56 PM By: Baruch Gouty RN, BSN Entered By: Baruch Gouty on 06/07/2020 14:37:44 -------------------------------------------------------------------------------- Lower Extremity Assessment Details Patient Name: Date of Service: Harry Foster. 06/07/2020 1:30 PM Medical Record Number: [id number] Patient Account Number: [id number] Date of Birth/Sex: Treating RN: 01-22-1945 (75 y.o. Harry Foster Primary Care Loriana Samad: [name] Other Clinician: Referring Nitya Cauthon: Treating Persis Graffius/Extender: [name] in Treatment: 3 Edema Assessment Assessed: [Left: Yes] [Right: Yes] Edema: [Left: Yes] [Right: Yes] Calf Left: Right: Point of Measurement: From Medial Instep 42 cm 48 cm Ankle Left: Right: Point of Measurement: From Medial Instep 27 cm 28 cm Vascular Assessment Pulses: Dorsalis Pedis Palpable: [Left:Yes] [Right:Yes] Electronic Signature(s) Signed: 06/07/2020 5:30:54 PM By: Lorrin Jackson Entered By: Lorrin Jackson on 06/07/2020 13:58:23 -------------------------------------------------------------------------------- Multi Wound Chart Details Patient Name: Date of Service: Harry Foster. 06/07/2020 1:30 PM Medical Record Number: [id number] Patient Account Number: [id number] Date of Birth/Sex: Treating RN: October 15, 1945 (75 y.o. Harry Foster, [name] Primary Care Taziyah Iannuzzi: [name] Other Clinician: Referring Shimshon Narula: Treating Rashawna Scoles/Extender: [name] in Treatment: 3 Vital Signs Height(in): 71 Pulse(bpm): 74 Weight(lbs): 761 Blood Pressure(mmHg): 104/70 Body Mass Index(BMI): 41 Temperature(F): 98.0 Respiratory Rate(breaths/min): 20 Photos: [1:No Photos Left, Lateral Lower Leg] [2:No Photos Right, Medial Lower Leg] [N/A:N/A N/A] Wound Location: [1:Blister] [2:Blister] [N/A:N/A] Wounding Event: [1:Venous Leg Ulcer] [2:Venous Leg Ulcer] [N/A:N/A] Primary Etiology: [1:Lymphedema] [2:Lymphedema] [N/A:N/A] Secondary Etiology: [1:Cataracts, Arrhythmia, Congestive Cataracts, Arrhythmia, Congestive N/A] Comorbid History: [1:Heart Failure, Hypertension, Peripheral Heart  Failure, Hypertension, Peripheral Venous Disease, Gout, Osteoarthritis Venous Disease, Gout, Osteoarthritis 05/09/2020] [2:04/04/2020] [N/A:N/A] Date Acquired: [1:3] [2:3] [N/A:N/A] Weeks of Treatment: [1:Open] [2:Healed - Epithelialized] [N/A:N/A] Wound Status: [1:0.4x0.3x0.1] [2:0x0x0] [N/A:N/A] Measurements L x W x D (cm) [1:0.094] [2:0] [N/A:N/A] A (cm) : rea [1:0.009] [2:0] [N/A:N/A] Volume (cm) : [1:99.50%] [2:100.00%] [N/A:N/A] % Reduction in Area: [1:99.50%] [2:100.00%] [N/A:N/A] % Reduction in Volume: [1:Full Thickness Without Exposed] [2:Full Thickness Without Exposed] [N/A:N/A] Classification: [1:Support Structures Medium] [2:Support Structures N/A] [N/A:N/A] Exudate Amount: [1:Serosanguineous] [2:N/A] [N/A:N/A] Exudate Type: [1:red, brown] [2:N/A] [N/A:N/A] Exudate Color: [1:Distinct, outline attached] [2:N/A] [N/A:N/A] Wound Margin: [1:Large (67-100%)] [2:N/A] [N/A:N/A] Granulation Amount: [1:Red] [2:N/A] [N/A:N/A] Granulation Quality: [1:None Present (0%)] [2:N/A] [N/A:N/A] Necrotic Amount: [1:Fat Layer (Subcutaneous Tissue): Yes N/A] [N/A:N/A] Exposed Structures: [1:Fascia: No Tendon: No Muscle: No Joint: No Bone: No Large (67-100%)] [2:N/A] [N/A:N/A] Treatment Notes Electronic Signature(s) Signed: 06/07/2020 2:41:00 PM By: Kalman Shan  DO Signed: 06/07/2020 5:27:47 PM By: Rhae Hammock RN Entered By: Kalman Shan on 06/07/2020 14:36:31 -------------------------------------------------------------------------------- Lamar Details Patient Name: Date of Service: Harry Foster. 06/07/2020 1:30 PM Medical Record Number: [id number] Patient Account Number: [id number] Date of Birth/Sex: Treating RN: 1945/05/06 (75 y.o. Harry Foster, [name] Primary Care Exa Bomba: [name] Other Clinician: Referring Gary Gabrielsen: Treating Tyiesha Brackney/Extender: [name] in Treatment: 3 Multidisciplinary Care Plan reviewed with physician Active Inactive Venous Leg Ulcer Nursing Diagnoses: Actual venous Insuffiency (use after diagnosis is confirmed) Knowledge deficit related to disease process and management Goals: Patient will maintain optimal edema control Date Initiated: 05/16/2020 Target Resolution Date: 06/17/2020 Goal Status: Active Patient/caregiver will verbalize understanding of disease process and disease management Date Initiated: 05/16/2020 Target Resolution Date: 06/17/2020 Goal Status: Active Interventions: Assess peripheral edema status every visit. Compression as ordered Provide education on venous insufficiency Notes: Wound/Skin Impairment Nursing Diagnoses: Impaired tissue integrity Knowledge deficit related to ulceration/compromised skin integrity Goals: Patient/caregiver will verbalize understanding of skin care regimen Date Initiated: 05/16/2020 Target Resolution Date: 06/17/2020 Goal Status: Active Ulcer/skin breakdown will have a volume reduction of 30% by week 4 Date Initiated: 05/16/2020 Target Resolution Date: 06/17/2020 Goal Status: Active Interventions: Assess patient/caregiver ability to obtain necessary supplies Assess patient/caregiver ability to perform ulcer/skin care regimen upon admission and as needed Assess ulceration(s) every  visit Provide education on ulcer and skin care Notes: Electronic Signature(s) Signed: 06/07/2020 5:27:47 PM By: Rhae Hammock RN Entered By: Rhae Hammock on 06/07/2020 13:50:52 -------------------------------------------------------------------------------- Pain Assessment Details Patient Name: Date of Service: Harry Foster. 06/07/2020 1:30 PM Medical Record Number: [id number] Patient Account Number: [id number] Date of Birth/Sex: Treating RN: [birth year] (75 y.o. Harry Foster, [name] Primary Care Arretta Toenjes: [name] Other Clinician: Referring Sumayyah Custodio: Treating Malaney Mcbean/Extender: [name] in Treatment: 3 Active Problems Location of Pain Severity and Description of Pain Patient Has Paino No Site Locations Pain Management and Medication Current Pain Management: Electronic Signature(s) Signed: 06/07/2020 2:13:29 PM By: [name] Signed: 06/07/2020 5:27:47 PM By: Rhae Hammock RN Entered By: [name] on 06/07/2020 13:46:28 -------------------------------------------------------------------------------- Patient/Caregiver Education Details Patient Name: Date of Service: Harry Foster 6/7/2022andnbsp1:30 PM Medical Record Number: [id number] Patient Account Number: [id number] Date of Birth/Gender: Treating RN: 1945-05-18 (75 y.o. Harry Foster Primary Care Physician: [name] Other Clinician: Referring Physician: Treating Physician/Extender: [name] in Treatment: 3 Education Assessment Education Provided To: Patient Education Topics Provided Wound/Skin Impairment: Methods: Explain/Verbal Responses: State content correctly Electronic Signature(s) Signed: 06/07/2020 5:27:47  PM By: Rhae Hammock RN Entered By: Rhae Hammock on 06/07/2020 13:51:21 -------------------------------------------------------------------------------- Wound  Assessment Details Patient Name: Date of Service: Harry Foster. 06/07/2020 1:30 PM Medical Record Number: [id number] Patient Account Number: [id number] Date of Birth/Sex: Treating RN: August 25, 1945 (75 y.o. Harry Foster Primary Care Elizabeth Paulsen: [name] Other Clinician: Referring Tavi Gaughran: Treating Karry Causer/Extender: [name] in Treatment: 3 Wound Status Wound Number: 1 Primary Venous Leg Ulcer Etiology: Wound Location: Left, Lateral Lower Leg Secondary Lymphedema Wounding Event: Blister Etiology: Date Acquired: 05/09/2020 Wound Open Weeks Of Treatment: 3 Status: Clustered Wound: No Comorbid Cataracts, Arrhythmia, Congestive Heart Failure, Hypertension, History: Peripheral Venous Disease, Gout, Osteoarthritis Photos Wound Measurements Length: (cm) 0.4 Width: (cm) 0.3 Depth: (cm) 0.1 Area: (cm) 0.094 Volume: (cm) 0.009 % Reduction in Area: 99.5% % Reduction in Volume: 99.5% Epithelialization: Large (67-100%) Tunneling: No Undermining: No Wound Description Classification: Full Thickness Without Exposed Support Structures Wound Margin: Distinct, outline attached Exudate Amount: Medium Exudate Type: Serosanguineous Exudate Color: red, brown Foul Odor After Cleansing: No Slough/Fibrino No Wound Bed Granulation Amount: Large (67-100%) Exposed Structure Granulation Quality: Red Fascia Exposed: No Necrotic Amount: None Present (0%) Fat Layer (Subcutaneous Tissue) Exposed: Yes Tendon Exposed: No Muscle Exposed: No Joint Exposed: No Bone Exposed: No Treatment Notes Wound #1 (Lower Leg) Wound Laterality: Left, Lateral Cleanser Soap and Water Discharge Instruction: May shower and wash wound with dial antibacterial soap and water prior to dressing change. Peri-Wound Care Sween Lotion (Moisturizing lotion) Discharge Instruction: Apply moisturizing lotion as directed Topical Primary Dressing Promogran Prisma Matrix,  4.34 (sq in) (silver collagen) Discharge Instruction: Moisten collagen with saline or hydrogel Secondary Dressing Woven Gauze Sponge, Non-Sterile 4x4 in Discharge Instruction: Apply over primary dressing as directed. Secured With Compression Wrap Kerlix Roll 4.5x3.1 (in/yd) Discharge Instruction: Apply Kerlix and Coban compression as directed. Coban Self-Adherent Wrap 4x5 (in/yd) Discharge Instruction: Apply over Kerlix as directed. Compression Stockings Add-Ons Electronic Signature(s) Signed: 06/07/2020 5:03:12 PM By: [name] Signed: 06/07/2020 5:30:54 PM By: Lorrin Jackson Entered By: [name] on 06/07/2020 16:30:31 -------------------------------------------------------------------------------- Wound Assessment Details Patient Name: Date of Service: Harry Foster. 06/07/2020 1:30 PM Medical Record Number: [id number] Patient Account Number: [id number] Date of Birth/Sex: Treating RN: Nov 26, 1945 (75 y.o. Harry Foster Primary Care Gina Leblond: [name] Other Clinician: Referring Naome Brigandi: Treating Deanna Wiater/Extender: [name] in Treatment: 3 Wound Status Wound Number: 2 Primary Venous Leg Ulcer Etiology: Wound Location: Right, Medial Lower Leg Secondary Lymphedema Wounding Event: Blister Etiology: Date Acquired: 04/04/2020 Wound Healed - Epithelialized Weeks Of Treatment: 3 Status: Clustered Wound: No Comorbid Cataracts, Arrhythmia, Congestive Heart Failure, Hypertension, History: Peripheral Venous Disease, Gout, Osteoarthritis Wound Measurements Length: (cm) Width: (cm) Depth: (cm) Area: (cm) Volume: (cm) 0 % Reduction in Area: 100% 0 % Reduction in Volume: 100% 0 0 0 Wound Description Classification: Full Thickness Without Exposed Support Structur es Electronic Signature(s) Signed: 06/07/2020 5:30:54 PM By: Lorrin Jackson Entered By: Lorrin Jackson on 06/07/2020  13:54:42 -------------------------------------------------------------------------------- Vitals Details Patient Name: Date of Service: Harry Leventhal G. 06/07/2020 1:30 PM Medical Record Number: [id number] Patient Account Number: [id number] Date of Birth/Sex: Treating RN: 07-28-1945 (75 y.o. Harry Foster, [name] Primary Care Cherilyn Sautter: [name] Other Clinician: Referring Dezmond Downie: Treating Merin Borjon/Extender: [name] in Treatment: 3 Vital Signs Time Taken: 13:46 Temperature (F): 98.0 Height (in): 71 Pulse (bpm): 74 Weight (lbs): 295 Respiratory Rate (breaths/min): 20 Body Mass Index (BMI): 41.1 Blood  Pressure (mmHg): 104/70 Reference Range: 80 - 120 mg / dl Electronic Signature(s) Signed: 06/07/2020 2:13:29 PM By: [name] Entered By: [name] on 06/07/2020 13:46:19

## 2020-06-09 ENCOUNTER — Other Ambulatory Visit: Payer: Self-pay

## 2020-06-09 ENCOUNTER — Ambulatory Visit (INDEPENDENT_AMBULATORY_CARE_PROVIDER_SITE_OTHER): Payer: Medicare Other | Admitting: *Deleted

## 2020-06-09 DIAGNOSIS — Z5181 Encounter for therapeutic drug level monitoring: Secondary | ICD-10-CM | POA: Diagnosis not present

## 2020-06-09 DIAGNOSIS — I4819 Other persistent atrial fibrillation: Secondary | ICD-10-CM | POA: Diagnosis not present

## 2020-06-09 LAB — POCT INR: INR: 2 (ref 2.0–3.0)

## 2020-06-09 NOTE — Patient Instructions (Signed)
Description   Continue on same dosage of Warfarin 1 tablet daily except for 2 tablets on Fridays. Recheck INR in 5 weeks. Call Coumadin Clinic for any questions/updates at [phone].

## 2020-06-14 ENCOUNTER — Other Ambulatory Visit: Payer: Self-pay

## 2020-06-14 ENCOUNTER — Encounter (HOSPITAL_BASED_OUTPATIENT_CLINIC_OR_DEPARTMENT_OTHER): Payer: Medicare Other | Admitting: Internal Medicine

## 2020-06-14 DIAGNOSIS — L97829 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ity: Secondary | ICD-10-CM | POA: Diagnosis not present

## 2020-06-14 NOTE — Progress Notes (Signed)
Harry, Foster ([id number]) Visit Report for 06/14/2020 HPI Details Patient Name: Date of Service: Harry Foster 06/14/2020 2:45 PM Medical Record Number: [id number] Patient Account Number: [id number] Date of Birth/Sex: Treating RN: 02-06-1945 (75 y.o. [name], [name] Primary Care Provider: [name] Other Clinician: Referring Provider: Treating Provider/Extender: Rowland Lathe Weeks in Treatment: 4 History of Present Illness HPI Description: Mr. Harry Foster is a 75 year old male with a past medical history of persistent atrial fibrillation on Coumadin, hypertension, gout, CHF And venous insufficiency that presents to the clinic for bilateral lower extremity wounds. He states he has a history of blistering to his legs that eventually pop and heal. He states that in the past 6 weeks he has been unable to heal his wounds. He keeps them wrapped however they soak through quickly and has to change the wraps frequently. 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 He sleeps in a recliner at night and keeps his legs down due to increased pain when they are elevated. He states this symptom started a few weeks ago. He denies signs of infection. 5/23; patient presents for 1 week follow-up. He had Kerlix/Coban wrap placed at last clinic visit and changed again last week due to increased drainage. He states that there has been an improvement in swelling and drainage over the past week. He is going to have arterial studies done tomorrow. He denies signs of infection. 5/31; patient presents for 1 week follow-up. He has been doing well with Kerlix/Coban wrap. He denies signs of infection. 6/7; patient presents for 1 week follow-up. He has been using Kerlix/Coban wraps bilaterally with collagen underneath. He has tolerated these well. He denies signs of infection. The right leg no longer has wounds. He has his [name] wraps  today 6/14; the patient is effectively healed on the left leg. He already is wearing his [name] wraps on the right leg he has 20/30 stockings below this. He has a stocking for the left leg. He says he is going to go to elastic therapy for 20/30 stockings to put under the [employer]) Signed: 06/14/2020 4:29:11 PM By: Linton Ham MD Entered By: Linton Ham on 06/14/2020 16:09:51 -------------------------------------------------------------------------------- Physical Exam Details Patient Name: Date of Service: Harry Foster 06/14/2020 2:45 PM Medical Record Number: [id number] Patient Account Number: [id number] Date of Birth/Sex: Treating RN: [birth year] (75 y.o. Erie Noe Primary Care Provider: [name] Other Clinician: Referring Provider: Treating Provider/Extender: Rowland Lathe Weeks in Treatment: 4 Constitutional Sitting or standing Blood Pressure is within target range for patient.. Pulse regular and within target range for patient.Marland Kitchen Respirations regular, non-labored and within target range.. Temperature is normal and within the target range for the patient.Marland Kitchen Appears in no distress. Cardiovascular Pedal pulses are palpable on the left. Notes Wound exam; right lower extremity. He has his compression stockings on the left lower extremity has everything closed this week. Edema control is quite good. Electronic Signature(s) Signed: 06/14/2020 4:29:11 PM By: Linton Ham MD Entered By: Linton Ham on 06/14/2020 16:11:03 -------------------------------------------------------------------------------- Physician Orders Details Patient Name: Date of Service: Harry Foster 06/14/2020 2:45 PM Medical Record Number: [id number] Patient Account Number: [id number] Date of Birth/Sex: Treating RN: Feb 02, 1945 (75 y.o. [name] Primary Care Provider: [name] Other Clinician: Referring  Provider: Treating Provider/Extender: Rowland Lathe Weeks in Treatment: 4 Verbal / Phone Orders: No  Diagnosis Coding Discharge From [facility] Services Discharge from [place] - Call if any future wound care needs. Edema Control - Lymphedema / SCD / Other Elevate legs to the level of the heart or above for 30 minutes daily and/or when sitting, a frequency of: - throughout the day. Avoid standing for long periods of time. Patient to wear own compression stockings every day. - FOR LIFE wear farrow wrap compression garments. Exercise regularly Moisturize legs daily. - Lotion every night before bed. Electronic Signature(s) Signed: 06/14/2020 4:29:11 PM By: Linton Ham MD Signed: 06/14/2020 6:05:55 PM By: Deon Pilling Entered By: Deon Pilling on 06/14/2020 15:18:57 -------------------------------------------------------------------------------- Problem List Details Patient Name: Date of Service: Harry Foster 06/14/2020 2:45 PM Medical Record Number: [id number] Patient Account Number: [id number] Date of Birth/Sex: Treating RN: [birth year] (75 y.o. 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 with 05/16/2020 No Yes unspecified severity L97.829 Non-pressure chronic ulcer of other part of left lower leg with unspecified 05/16/2020 No Yes severity I87.2 Venous insufficiency (chronic) (peripheral) 05/16/2020 No Yes I48.19 Other persistent atrial fibrillation 05/16/2020 No Yes Z79.01 Long term (current) use of anticoagulants 05/16/2020 No Yes I50.40 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 05/16/2020 No Yes failure M1A.9XX1 Chronic  gout, unspecified, with tophus (tophi) 05/16/2020 No Yes N18.4 Chronic kidney disease, stage 4 (severe) 05/16/2020 No Yes Inactive Problems Resolved Problems Electronic Signature(s) Signed: 06/14/2020 4:29:11 PM By: Linton Ham MD Entered By: Linton Ham on 06/14/2020 16:08:39 -------------------------------------------------------------------------------- Progress Note Details Patient Name: Date of Service: Harry Foster 06/14/2020 2:45 PM Medical Record Number: [id number] Patient Account Number: [id number] Date of Birth/Sex: Treating RN: 04-15-1945 (75 y.o. [name], [name] Primary Care Provider: [name] Other Clinician: Referring Provider: Treating Provider/Extender: Rowland Lathe Weeks in Treatment: 4 Subjective History of Present Illness (HPI) Mr. Generoso Cropper Foster is a 75 year old male with a past medical history of persistent atrial fibrillation on Coumadin, hypertension, gout, CHF And venous insufficiency that presents to the clinic for bilateral lower extremity wounds. He states he has a history of blistering to his legs that eventually pop and heal. He states that in the past 6 weeks he has been unable to heal his wounds. He keeps them wrapped however they soak through quickly and has to change the wraps frequently. 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 He sleeps in a recliner at night and keeps his legs down due to increased pain when they are elevated. He states this symptom started a few weeks ago. He denies signs of infection. 5/23; patient presents for 1 week follow-up. He had Kerlix/Coban wrap placed at last clinic visit and changed again last week due to increased drainage. He states that there has been an improvement in swelling and drainage over the past week. He is going to have arterial studies done tomorrow. He denies signs of infection. 5/31; patient presents for 1 week  follow-up. He has been doing well with Kerlix/Coban wrap. He denies signs of infection. 6/7; patient presents for 1 week follow-up. He has been using Kerlix/Coban wraps bilaterally with collagen underneath. He has tolerated these well. He denies signs of infection. The right leg no longer has wounds. He has his [place] wraps  today 6/14; the patient is effectively healed on the left leg. He already is wearing his [name] wraps on the right leg he has 20/30 stockings below this. He has a stocking for the left leg. He says he is going to go to elastic therapy for 20/30 stockings to put under the [place] wraps Objective Constitutional Sitting or standing Blood Pressure is within target range for patient.. Pulse regular and within target range for patient.Marland Kitchen Respirations regular, non-labored and within target range.. Temperature is normal and within the target range for the patient.Marland Kitchen Appears in no distress. Vitals Time Taken: 2:32 PM, Height: 71 in, Weight: 295 lbs, BMI: 41.1, Temperature: 98.1 F, Pulse: 67 bpm, Respiratory Rate: 20 breaths/min, Blood Pressure: 117/70 mmHg. Cardiovascular Pedal pulses are palpable on the left. General Notes: Wound exam; right lower extremity. He has his compression stockings on the left lower extremity has everything closed this week. Edema control is quite good. Integumentary (Hair, Skin) Wound #1 status is Healed - Epithelialized. Original cause of wound was Blister. The date acquired was: 05/09/2020. The wound has been in treatment 4 weeks. The wound is located on the Left,Lateral Lower Leg. The wound measures 0cm length x 0cm width x 0cm depth; 0cm^2 area and 0cm^3 volume. There is no tunneling or undermining noted. There is a none present amount of drainage noted. There is no granulation within the wound bed. There is no necrotic tissue within the wound bed. 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 Edema Control - Lymphedema / SCD / Other: Elevate legs to the level of the heart or above for 30 minutes daily and/or when sitting, a frequency of: - throughout the day. Avoid standing for long periods of time. Patient to wear own compression stockings every day. - FOR LIFE wear farrow wrap compression garments. Exercise regularly Moisturize legs daily. - Lotion every night before bed. 1. The patient can be discharged from the clinic 2. He has bilateral Farrow wraps which she is using 20/30 stockings as the undergarment. He says he does this because of ankle edema Electronic Signature(s) Signed: 06/14/2020 4:29:11 PM By: Linton Ham MD Entered By: Linton Ham on 06/14/2020 16:11:58 -------------------------------------------------------------------------------- SuperBill Details Patient Name: Date of Service: Harry Foster 06/14/2020 Medical Record Number: [id number] Patient Account Number: [id number] Date of Birth/Sex: Treating RN: 11-11-1945 (75 y.o. 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

## 2020-06-16 DIAGNOSIS — M1A9XX1 Chronic gout, unspecified, with tophus (tophi): Secondary | ICD-10-CM | POA: Diagnosis not present

## 2020-06-16 NOTE — Progress Notes (Signed)
Harry Foster ([id number]) Visit Report for 06/14/2020 Arrival Information Details Patient Name: Date of Service: Harry Foster 06/14/2020 2:45 PM Medical Record Number: [id number] Patient Account Number: [id number] Date of Birth/Sex: Treating RN: 1945/08/11 (75 y.o. Harry Foster, [name] Primary Care Ciearra Rufo: Harry Foster Other Clinician: Referring Anne-Marie Genson: Treating Courtney Bellizzi/Extender: Rowland Lathe Weeks in Treatment: 4 Visit Information History Since Last Visit Added or deleted any medications: No Patient Arrived: Ambulatory Any new allergies or adverse reactions: No Arrival Time: 14:31 Had a fall or experienced change in No Accompanied By: self activities of daily living that may affect Transfer Assistance: None risk of falls: Patient Identification Verified: Yes Signs or symptoms of abuse/neglect since last visito No Secondary Verification Process Completed: Yes Hospitalized since last visit: No Patient Requires Transmission-Based Precautions: No Implantable device outside of the clinic excluding No Patient Has Alerts: Yes cellular tissue based products placed in the center Patient Alerts: R ABI: 1.63 TBI: 1.36 since last visit: L ABI: 1.49 TBI: 1.34 Has Dressing in Place as Prescribed: Yes 05/2020 Pain Present Now: No Electronic Signature(s) Signed: 06/14/2020 4:42:14 PM By: [name] Entered By: [name] on 06/14/2020 14:32:13 -------------------------------------------------------------------------------- Clinic Level of Care Assessment Details Patient Name: Date of Service: Harry Foster 06/14/2020 2:45 PM Medical Record Number: [id number] Patient Account Number: [id number] Date of Birth/Sex: Treating RN: 1945/07/28 (75 y.o. Harry Foster, Meta.Reding Primary Care Rebekah Zackery: Harry Foster Other Clinician: Referring Harry Foster: Treating Harry Foster/Extender: Rowland Lathe Weeks in Treatment:  4 Clinic Level of Care Assessment Items TOOL 4 Quantity Score X- 1 0 Use when only an EandM is performed on FOLLOW-UP visit ASSESSMENTS - Nursing Assessment / Reassessment X- 1 10 Reassessment of Co-morbidities (includes updates in patient status) X- 1 5 Reassessment of Adherence to Treatment Plan ASSESSMENTS - Wound and Skin A ssessment / Reassessment X - Simple Wound Assessment / Reassessment - one wound 1 5 []  - 0 Complex Wound Assessment / Reassessment - multiple wounds X- 1 10 Dermatologic / Skin Assessment (not related to wound area) ASSESSMENTS - Focused Assessment X- 1 5 Circumferential Edema Measurements - multi extremities X- 1 10 Nutritional Assessment / Counseling / Intervention []  - 0 Lower Extremity Assessment (monofilament, tuning fork, pulses) []  - 0 Peripheral Arterial Disease Assessment (using hand held doppler) ASSESSMENTS - Ostomy and/or Continence Assessment and Care []  - 0 Incontinence Assessment and Management []  - 0 Ostomy Care Assessment and Management (repouching, etc.) PROCESS - Coordination of Care X - Simple Patient / Family Education for ongoing care 1 15 []  - 0 Complex (extensive) Patient / Family Education for ongoing care X- 1 10 Staff obtains Programmer, systems, Records, T Results / Process Orders est []  - 0 Staff telephones HHA, Nursing Homes / Clarify orders / etc []  - 0 Routine Transfer to another Facility (non-emergent condition) []  - 0 Routine Hospital Admission (non-emergent condition) []  - 0 New Admissions / Biomedical engineer / Ordering NPWT Apligraf, etc. , []  - 0 Emergency Hospital Admission (emergent condition) X- 1 10 Simple Discharge Coordination []  - 0 Complex (extensive) Discharge Coordination PROCESS - Special Needs []  - 0 Pediatric / Minor Patient Management []  - 0 Isolation Patient Management []  - 0 Hearing / Language / Visual special needs []  - 0 Assessment of Community assistance (transportation, D/C  planning, etc.) []  - 0 Additional assistance / Altered mentation []  - 0 Support Surface(s) Assessment (bed, cushion, seat, etc.) INTERVENTIONS - Wound Cleansing / Measurement X -  Simple Wound Cleansing - one wound 1 5 []  - 0 Complex Wound Cleansing - multiple wounds X- 1 5 Wound Imaging (photographs - any number of wounds) []  - 0 Wound Tracing (instead of photographs) X- 1 5 Simple Wound Measurement - one wound []  - 0 Complex Wound Measurement - multiple wounds INTERVENTIONS - Wound Dressings []  - 0 Small Wound Dressing one or multiple wounds []  - 0 Medium Wound Dressing one or multiple wounds []  - 0 Large Wound Dressing one or multiple wounds []  - 0 Application of Medications - topical []  - 0 Application of Medications - injection INTERVENTIONS - Miscellaneous []  - 0 External ear exam []  - 0 Specimen Collection (cultures, biopsies, blood, body fluids, etc.) []  - 0 Specimen(s) / Culture(s) sent or taken to Lab for analysis []  - 0 Patient Transfer (multiple staff / Civil Service fast streamer / Similar devices) []  - 0 Simple Staple / Suture removal (25 or less) []  - 0 Complex Staple / Suture removal (26 or more) []  - 0 Hypo / Hyperglycemic Management (close monitor of Blood Glucose) []  - 0 Ankle / Brachial Index (ABI) - do not check if billed separately X- 1 5 Vital Signs Has the patient been seen at the hospital within the last three years: Yes Total Score: 100 Level Of Care: New/Established - Level 3 Electronic Signature(s) Signed: 06/14/2020 6:05:55 PM By: Deon Pilling Entered By: Deon Pilling on 06/14/2020 16:12:02 -------------------------------------------------------------------------------- Encounter Discharge Information Details Patient Name: Date of Service: Harry Foster 06/14/2020 2:45 PM Medical Record Number: [id number] Patient Account Number: [id number] Date of Birth/Sex: Treating RN: 1945-02-20 (75 y.o. Harry Foster Primary Care Stefanee Mckell: Harry Foster Other Clinician: Referring Acie Custis: Treating Roxene Alviar/Extender: Rowland Lathe Weeks in Treatment: 4 Encounter Discharge Information Items Discharge Condition: Stable Ambulatory Status: Ambulatory Discharge Destination: Home Transportation: Private Auto Accompanied By: self Schedule Follow-up Appointment: No Clinical Summary of Care: Electronic Signature(s) Signed: 06/14/2020 6:05:55 PM By: Deon Pilling Entered By: Deon Pilling on 06/14/2020 16:12:30 -------------------------------------------------------------------------------- Lower Extremity Assessment Details Patient Name: Date of Service: Harry Foster 06/14/2020 2:45 PM Medical Record Number: [id number] Patient Account Number: [id number] Date of Birth/Sex: Treating RN: August 04, 1945 (75 y.o. [name] Primary Care Ezinne Yogi: Harry Foster Other Clinician: Referring Lilo Wallington: Treating Ryli Standlee/Extender: Rowland Lathe Weeks in Treatment: 4 Edema Assessment Assessed: [Left: No] [Right: No] Edema: [Left: Ye] [Right: s] Calf Left: Right: Point of Measurement: From Medial Instep 42 cm Ankle Left: Right: Point of Measurement: From Medial Instep 27 cm Vascular Assessment Pulses: Dorsalis Pedis Palpable: [Left:Yes] Electronic Signature(s) Signed: 06/14/2020 6:10:01 PM By: Baruch Gouty RN, BSN Entered By: Baruch Gouty on 06/14/2020 14:57:42 -------------------------------------------------------------------------------- Multi Wound Chart Details Patient Name: Date of Service: Harry Foster 06/14/2020 2:45 PM Medical Record Number: [id number] Patient Account Number: [id number] Date of Birth/Sex: Treating RN: 1945/04/10 (75 y.o. Harry Foster, [name] Primary Care Cassandr Cederberg: Harry Foster Other Clinician: Referring Toretto Tingler: Treating Janelis Stelzer/Extender: Rowland Lathe Weeks in Treatment: 4 Vital Signs Height(in):  43 Pulse(bpm): 18 Weight(lbs): 332 Blood Pressure(mmHg): 117/70 Body Mass Index(BMI): 41 Temperature(F): 98.1 Respiratory Rate(breaths/min): 20 Photos: [1:No Photos Left, Lateral Lower Leg] [N/A:N/A N/A] Wound Location: [1:Blister] [N/A:N/A] Wounding Event: [1:Venous Leg Ulcer] [N/A:N/A] Primary Etiology: [1:Lymphedema] [N/A:N/A] Secondary Etiology: [1:Cataracts, Arrhythmia, Congestive N/A] Comorbid History: [1:Heart Failure, Hypertension, Peripheral Venous Disease, Gout, Osteoarthritis 05/09/2020] [N/A:N/A] Date Acquired: [1:4] [N/A:N/A] Weeks of Treatment: [1:Healed - Epithelialized] [N/A:N/A] Wound Status: [1:0x0x0] [N/A:N/A] Measurements L  x W x D (cm) [1:0] [N/A:N/A] A (cm) : rea [1:0] [N/A:N/A] Volume (cm) : [1:100.00%] [N/A:N/A] % Reduction in Area: [1:100.00%] [N/A:N/A] % Reduction in Volume: [1:Full Thickness Without Exposed] [N/A:N/A] Classification: [1:Support Structures None Present] [N/A:N/A] Exudate Amount: [1:None Present (0%)] [N/A:N/A] Granulation Amount: [1:None Present (0%)] [N/A:N/A] Necrotic Amount: [1:Fascia: No] [N/A:N/A] Exposed Structures: [1:Fat Layer (Subcutaneous Tissue): No Tendon: No Muscle: No Joint: No Bone: No Large (67-100%)] [N/A:N/A] Treatment Notes Electronic Signature(s) Signed: 06/14/2020 4:29:11 PM By: Linton Ham MD Signed: 06/15/2020 6:22:58 PM By: Rhae Hammock RN Entered By: Linton Ham on 06/14/2020 16:08:45 -------------------------------------------------------------------------------- Multi-Disciplinary Care Plan Details Patient Name: Date of Service: Harry Foster 06/14/2020 2:45 PM Medical Record Number: [id number] Patient Account Number: [id number] Date of Birth/Sex: Treating RN: 1945-05-23 (75 y.o. Harry Foster Primary Care Aalani Aikens: Harry Foster Other Clinician: Referring Shiryl Ruddy: Treating Fani Rotondo/Extender: Rowland Lathe Weeks in Treatment: 4 Multidisciplinary Care  Plan reviewed with physician Active Inactive Electronic Signature(s) Signed: 06/14/2020 6:05:55 PM By: Deon Pilling Entered By: Deon Pilling on 06/14/2020 16:11:11 -------------------------------------------------------------------------------- Pain Assessment Details Patient Name: Date of Service: Harry Foster 06/14/2020 2:45 PM Medical Record Number: [id number] Patient Account Number: [id number] Date of Birth/Sex: Treating RN: 03-02-1945 (75 y.o. Harry Foster, [name] Primary Care Jalayna Josten: Harry Foster Other Clinician: Referring Shawanda Sievert: Treating Brieana Shimmin/Extender: Rowland Lathe Weeks in Treatment: 4 Active Problems Location of Pain Severity and Description of Pain Patient Has Paino No Site Locations Pain Management and Medication Current Pain Management: Electronic Signature(s) Signed: 06/14/2020 4:42:14 PM By: [name] Signed: 06/15/2020 6:22:58 PM By: Rhae Hammock RN Entered By: [name] on 06/14/2020 14:32:43 -------------------------------------------------------------------------------- Patient/Caregiver Education Details Patient Name: Date of Service: Harry Foster 6/14/2022andnbsp2:45 PM Medical Record Number: [id number] Patient Account Number: [id number] Date of Birth/Gender: Treating RN: 04-05-1945 (75 y.o. Harry Foster Primary Care Physician: Harry Foster Other Clinician: Referring Physician: Treating Physician/Extender: Earl Many in Treatment: 4 Education Assessment Education Provided To: Patient Education Topics Provided Wound/Skin Impairment: Handouts: Skin Care Do's and Dont's Methods: Explain/Verbal Responses: Reinforcements needed Electronic Signature(s) Signed: 06/14/2020 6:05:55 PM By: Deon Pilling Entered By: Deon Pilling on 06/14/2020 16:11:21 -------------------------------------------------------------------------------- Wound  Assessment Details Patient Name: Date of Service: Harry Foster 06/14/2020 2:45 PM Medical Record Number: [id number] Patient Account Number: [id number] Date of Birth/Sex: Treating RN: February 09, 1945 (75 y.o. Harry Foster, 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, Lateral Lower Leg Secondary Lymphedema Wounding Event: Blister Etiology: Date Acquired: 05/09/2020 Wound Healed - Epithelialized Weeks Of Treatment: 4 Status: Clustered Wound: No Comorbid Cataracts, Arrhythmia, Congestive Heart Failure, Hypertension, History: Peripheral Venous Disease, Gout, Osteoarthritis Photos Wound Measurements Length: (cm) Width: (cm) Depth: (cm) Area: (cm) Volume: (cm) 0 % Reduction in Area: 100% 0 % Reduction in Volume: 100% 0 Epithelialization: Large (67-100%) 0 Tunneling: No 0 Undermining: No Wound Description Classification: Full Thickness Without Exposed Support Structures Exudate Amount: None Present Foul Odor After Cleansing: No Slough/Fibrino No Wound Bed Granulation Amount: None Present (0%) Exposed Structure Necrotic Amount: None Present (0%) Fascia Exposed: No Fat Layer (Subcutaneous Tissue) Exposed: No Tendon Exposed: No Muscle Exposed: No Joint Exposed: No Bone Exposed: No Electronic Signature(s) Signed: 06/15/2020 6:22:58 PM By: Rhae Hammock RN Signed: 06/16/2020 10:52:16 AM By: [name] Previous Signature: 06/14/2020  6:10:01 PM Version By: Baruch Gouty RN, BSN Entered By: [name] on 06/15/2020 13:52:33 -------------------------------------------------------------------------------- Vitals Details Patient Name: Date of Service: Harry Foster 06/14/2020 2:45 PM Medical Record Number: [id number] Patient Account Number:  [id number] Date of Birth/Sex: Treating RN: Feb 01, 1945 (75 y.o. Harry Foster, [name] Primary Care Atif Chapple: Harry Foster Other Clinician: Referring Anise Harbin: Treating Leman Martinek/Extender: Rowland Lathe Weeks in Treatment: 4 Vital Signs Time Taken: 14:32 Temperature (F): 98.1 Height (in): 71 Pulse (bpm): 67 Weight (lbs): 295 Respiratory Rate (breaths/min): 20 Body Mass Index (BMI): 41.1 Blood Pressure (mmHg): 117/70 Reference Range: 80 - 120 mg / dl Electronic Signature(s) Signed: 06/14/2020 4:42:14 PM By: [name] Entered By: [name] on 06/14/2020 14:32:32

## 2020-06-21 ENCOUNTER — Encounter (HOSPITAL_BASED_OUTPATIENT_CLINIC_OR_DEPARTMENT_OTHER): Payer: Medicare Other | Admitting: Internal Medicine

## 2020-06-30 ENCOUNTER — Other Ambulatory Visit: Payer: Self-pay

## 2020-06-30 ENCOUNTER — Encounter (HOSPITAL_BASED_OUTPATIENT_CLINIC_OR_DEPARTMENT_OTHER): Payer: Medicare Other | Admitting: Internal Medicine

## 2020-06-30 DIAGNOSIS — M1A9XX1 Chronic gout, unspecified, with tophus (tophi): Secondary | ICD-10-CM | POA: Diagnosis not present

## 2020-06-30 DIAGNOSIS — Z7901 Long term (current) use of anticoagulants: Secondary | ICD-10-CM | POA: Diagnosis not present

## 2020-06-30 DIAGNOSIS — I872 Venous insufficiency (chronic) (peripheral): Secondary | ICD-10-CM

## 2020-06-30 DIAGNOSIS — N184 Chronic kidney disease, stage 4 (severe): Secondary | ICD-10-CM

## 2020-06-30 DIAGNOSIS — I504 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 failure: Secondary | ICD-10-CM

## 2020-06-30 DIAGNOSIS — I130 Hypertensive heart and chronic kidney disease with heart failure and stage 1 through stage 4 chronic kidney disease, or unspecified chronic kidney disease: Secondary | ICD-10-CM | POA: Diagnosis not present

## 2020-06-30 DIAGNOSIS — I4819 Other persistent atrial fibrillation: Secondary | ICD-10-CM | POA: Diagnosis not present

## 2020-06-30 DIAGNOSIS — L97829 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ity: Secondary | ICD-10-CM

## 2020-06-30 DIAGNOSIS — L97819 Non-pressure chronic ulcer of other part of right lower leg with unspecified severity: Secondary | ICD-10-CM | POA: Diagnosis not present

## 2020-06-30 NOTE — Progress Notes (Signed)
Harry, Foster ([id number]) Visit Report for 06/30/2020 Chief Complaint Document Details Patient Name: Date of Service: Harry Foster 06/30/2020 3:15 PM Medical Record Number: [id number] Patient Account Number: [id number] Date of Birth/Sex: Treating RN: 07/12/1945 (75 y.o. [name], [name] Primary Care Provider: [name] Other Clinician: Referring Provider: Treating Provider/Extender: [name] in Treatment: 0 Information Obtained from: Patient Chief Complaint Bilateral lower extremity wounds Electronic Signature(s) Signed: 06/30/2020 5:02:08 PM By: Kalman Shan DO Entered By: Kalman Shan on 06/30/2020 16:57:09 -------------------------------------------------------------------------------- HPI Details Patient Name: Date of Service: Harry Foster 06/30/2020 3:15 PM Medical Record Number: [id number] Patient Account Number: [id number] Date of Birth/Sex: Treating RN: 08/06/1945 (75 y.o. [name], [name] Primary Care Provider: [name] Other Clinician: Referring Provider: Treating Provider/Extender: [name] in Treatment: 0 History of Present Illness HPI Description: Mr. Harry Foster is a 75 year old male with a past medical history of persistent atrial fibrillation on Coumadin, hypertension, gout, CHF And venous insufficiency that presents to the clinic for bilateral lower extremity wounds. He states he has a history of blistering to his legs that eventually pop and heal. He states that in the past 6 weeks he has been unable to heal his wounds. He keeps them wrapped however they soak through quickly and has to change the wraps frequently. 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 He sleeps in a recliner at night and keeps his legs down due to increased pain when they are elevated. He states this symptom started a few weeks ago. He  denies signs of infection. 5/23; patient presents for 1 week follow-up. He had Kerlix/Coban wrap placed at last clinic visit and changed again last week due to increased drainage. He states that there has been an improvement in swelling and drainage over the past week. He is going to have arterial studies done tomorrow. He denies signs of infection. 5/31; patient presents for 1 week follow-up. He has been doing well with Kerlix/Coban wrap. He denies signs of infection. 6/7; patient presents for 1 week follow-up. He has been using Kerlix/Coban wraps bilaterally with collagen underneath. He has tolerated these well. He denies signs of infection. The right leg no longer has wounds. He has his [name] wraps today 6/14; the patient is effectively healed on the left leg. He already is wearing his [name] wraps on the right leg he has 20/30 stockings below this. He has a stocking for the left leg. He says he is going to go to elastic therapy for 20/30 stockings to put under the Farrow wraps 6/30; patient was discharged from the clinic 2 weeks ago for closed wounds. He reports using Farrow wraps bilaterally. He says he noticed in the past week he was developing new wounds. He denies signs of infection. Electronic Signature(s) Signed: 06/30/2020 5:02:08 PM By: Kalman Shan DO Entered By: Kalman Shan on 06/30/2020 16:58:32 -------------------------------------------------------------------------------- Physical Exam Details Patient Name: Date of Service: Harry Foster 06/30/2020 3:15 PM Medical Record Number: [id number] Patient Account Number: [id number] Date of Birth/Sex: Treating RN: August 19, 1945 (75 y.o. Erie Noe Primary Care Provider: [name] Other Clinician: Referring Provider: Treating Provider/Extender: [name] in Treatment: 0 Constitutional respirations regular, non-labored and within target range for  patient.Marland Kitchen Psychiatric pleasant and cooperative. Notes Right lower extremity has compression wrap Left lower extremity: He has a few scattered wounds limited to skin  breakdown. He has 2+ pitting edema to the knee Electronic Signature(s) Signed: 06/30/2020 5:02:08 PM By: Kalman Shan DO Entered By: Kalman Shan on 06/30/2020 16:59:14 -------------------------------------------------------------------------------- Physician Orders Details Patient Name: Date of Service: Harry Foster 06/30/2020 3:15 PM Medical Record Number: [id number] Patient Account Number: [id number] Date of Birth/Sex: Treating RN: [birth year] (75 y.o. 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 - Dr. Heber [place] Return A Bathing/ Shower/ Hygiene May shower with protection but do not get wound dressing(s) wet. - use cast protector Edema Control - Lymphedema / SCD / Other Bilateral Lower Extremities Elevate legs to the level of the heart or above for 30 minutes daily and/or when sitting, a frequency of: - throughout the day Avoid standing for long periods of time. Exercise regularly Wound Treatment Wound #5 - Lower Leg Wound Laterality: Left, Anterior, Distal Cleanser: Soap and Water 1 x Per Week/7 Days Discharge  Instructions: May shower and wash wound with dial antibacterial soap and water prior to dressing change. Cleanser: Wound Cleanser 1 x Per Week/7 Days Discharge Instructions: Cleanse the wound with wound cleanser prior to applying a clean dressing using gauze sponges, not tissue or cotton balls. Peri-Wound Care: Zinc Oxide Ointment 30g tube 1 x Per Week/7 Days Discharge Instructions: Apply Zinc Oxide to periwound with each dressing change Peri-Wound Care: Sween Lotion (Moisturizing lotion) 1 x Per Week/7 Days Discharge Instructions: Apply moisturizing lotion as directed Prim Dressing: Promogran Prisma Matrix, 4.34 (sq in) (silver collagen) 1 x Per Week/7 Days ary Discharge Instructions: Moisten collagen with saline or hydrogel Secondary Dressing: Woven Gauze Sponge, Non-Sterile 4x4 in 1 x Per Week/7 Days Discharge Instructions: Apply over primary dressing as directed. Secondary Dressing: ABD Pad, 5x9 1 x Per Week/7 Days Discharge Instructions: Apply over primary dressing as directed. Compression Wrap: Kerlix Roll 4.5x3.1 (in/yd) 1 x Per Week/7 Days Discharge Instructions: Apply Kerlix and Coban compression as directed. Compression Wrap: Coban Self-Adherent Wrap 4x5 (in/yd) 1 x Per Week/7 Days Discharge Instructions: Apply over Kerlix as directed. Wound #6 - Lower Leg Wound Laterality: Left, Medial Cleanser: Soap and Water 1 x Per Week/7 Days Discharge Instructions: May shower and wash wound with dial antibacterial soap and water prior to dressing change. Cleanser: Wound Cleanser 1 x Per Week/7 Days Discharge Instructions: Cleanse the wound with wound cleanser prior to applying a clean dressing using gauze sponges, not tissue or cotton balls. Peri-Wound Care: Zinc Oxide Ointment 30g tube 1 x Per Week/7 Days Discharge Instructions: Apply Zinc Oxide to periwound with each dressing change Peri-Wound Care: Sween Lotion (Moisturizing lotion) 1 x Per Week/7 Days Discharge Instructions: Apply  moisturizing lotion as directed Prim Dressing: Promogran Prisma Matrix, 4.34 (sq in) (silver collagen) 1 x Per Week/7 Days ary Discharge Instructions: Moisten collagen with saline or hydrogel Secondary Dressing: Woven Gauze Sponge, Non-Sterile 4x4 in 1 x Per Week/7 Days Discharge Instructions: Apply over primary dressing as directed. Secondary Dressing: ABD Pad, 5x9 1 x Per Week/7 Days Discharge Instructions: Apply over  primary dressing as directed. Compression Wrap: Kerlix Roll 4.5x3.1 (in/yd) 1 x Per Week/7 Days Discharge Instructions: Apply Kerlix and Coban compression as directed. Compression Wrap: Coban Self-Adherent Wrap 4x5 (in/yd) 1 x Per Week/7 Days Discharge Instructions: Apply over Kerlix as directed. Electronic Signature(s) Signed: 06/30/2020 5:02:08 PM By: Kalman Shan DO Entered By: Kalman Shan on 06/30/2020 16:59:31 -------------------------------------------------------------------------------- Problem List Details Patient Name: Date of Service: Harry Foster 06/30/2020 3:15 PM Medical Record Number: [id number] Patient Account Number: [id number] Date of Birth/Sex: Treating RN: [birth year] (75 y.o. [name], [name] Primary Care Provider: [name] Other Clinician: Referring Provider: Treating Provider/Extender: [name] in Treatment: 0 Active Problems ICD-10 Encounter Code Description Active Date MDM Diagnosis L97.829 Non-pressure chronic ulcer of other part of left lower leg with unspecified 05/16/2020 No Yes severity I87.2 Venous insufficiency (chronic) (peripheral) 05/16/2020 No Yes I48.19 Other persistent atrial fibrillation 05/16/2020 No Yes Z79.01 Long term (current) use of anticoagulants 05/16/2020 No Yes I50.40 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 05/16/2020 No Yes failure M1A.9XX1 Chronic gout, unspecified, with tophus (tophi) 05/16/2020 No Yes N18.4 Chronic  kidney disease, stage 4 (severe) 05/16/2020 No Yes Inactive Problems Resolved Problems ICD-10 Code Description Active Date Resolved Date L97.919 Non-pressure chronic ulcer of unspecified part of right lower leg with unspecified 05/16/2020 05/16/2020 severity Electronic Signature(s) Signed: 06/30/2020 5:02:08 PM By: Kalman Shan DO Entered By: Kalman Shan on 06/30/2020 16:56:54 -------------------------------------------------------------------------------- Progress Note Details Patient Name: Date of Service: Harry Foster 06/30/2020 3:15 PM Medical Record Number: [id number] Patient Account Number: [id number] Date of Birth/Sex: Treating RN: [birth year] (75 y.o. [name], [name] Primary Care Provider: [name] Other Clinician: Referring Provider: Treating Provider/Extender: [name] in Treatment: 0 Subjective Chief Complaint Information obtained from Patient Bilateral lower extremity wounds History of Present Illness (HPI) Mr. Izaan Kingbird Foster is a 75 year old male with a past medical history of persistent atrial fibrillation on Coumadin, hypertension, gout, CHF And venous insufficiency that presents to the clinic for bilateral lower extremity wounds. He states he has a history of blistering to his legs that eventually pop and heal. He states that in the past 6 weeks he has been unable to heal his wounds. He keeps them wrapped however they soak through quickly and has to change the wraps frequently. 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 He sleeps in a recliner at night and keeps his legs down due to increased pain when they are elevated. He states this symptom started a few weeks ago. He denies signs of infection. 5/23; patient presents for 1 week follow-up. He had Kerlix/Coban wrap placed at last clinic visit and changed again last week due to increased drainage. He states that there  has been an improvement in swelling and drainage over the past week. He is going to have arterial studies done tomorrow. He denies signs of infection. 5/31; patient presents for 1 week follow-up. He has been doing well with Kerlix/Coban wrap. He denies signs of infection. 6/7; patient presents for 1 week follow-up. He has been using Kerlix/Coban wraps bilaterally with collagen underneath. He has tolerated these well. He denies signs of infection. The right leg no longer has wounds. He has his [name] wraps today 6/14; the patient is effectively healed on the left leg. He already is wearing his [name] wraps on the right leg he has 20/30 stockings below this. He has a stocking for  the left leg. He says he is going to go to elastic therapy for 20/30 stockings to put under the Farrow wraps 6/30; patient was discharged from the clinic 2 weeks ago for closed wounds. He reports using Farrow wraps bilaterally. He says he noticed in the past week he was developing new wounds. He denies signs of infection. Patient History Information obtained from Patient. Family History Cancer - Mother,Siblings, Heart Disease - Father, No family history of Diabetes, Hereditary Spherocytosis, Hypertension, Kidney Disease, Lung Disease, Seizures, Stroke, Thyroid Problems, Tuberculosis. Social History Never smoker, Marital Status - Widowed, Alcohol Use - Never, Drug Use - No History, Caffeine Use - Daily - coffee. Medical History Eyes Patient has history of Cataracts - mild Denies history of Glaucoma, Optic Neuritis Ear/Nose/Mouth/Throat Denies history of Chronic sinus problems/congestion, Middle ear problems Cardiovascular Patient has history of Arrhythmia - afib, Congestive Heart Failure, Hypertension, Peripheral Venous Disease Endocrine Denies history of Type I Diabetes, Type II Diabetes Genitourinary Denies history of End Stage Renal Disease Integumentary (Skin) Denies history of History of  Burn Musculoskeletal Patient has history of Gout, Osteoarthritis Denies history of Rheumatoid Arthritis, Osteomyelitis Oncologic Denies history of Received Chemotherapy, Received Radiation Psychiatric Denies history of Anorexia/bulimia, Confinement Anxiety Hospitalization/Surgery History - bil knee replacements. Medical A Surgical History Notes nd Constitutional Symptoms (General Health) morbid obesity Genitourinary CKD stage 3 Objective Constitutional respirations regular, non-labored and within target range for patient.. Vitals Time Taken: 4:01 PM, Height: 71 in, Weight: 295 lbs, BMI: 41.1, Temperature: 98.4 F, Pulse: 89 bpm, Respiratory Rate: 16 breaths/min, Blood Pressure: 115/76 mmHg. Psychiatric pleasant and cooperative. General Notes: Right lower extremity has compression wrap Left lower extremity: He has a few scattered wounds limited to skin breakdown. He has 2+ pitting edema to the knee Integumentary (Hair, Skin) Wound #5 status is Open. Original cause of wound was Blister. The date acquired was: 06/27/2020. The wound is located on the [facility] Lower Leg. The wound measures 1.8cm length x 3cm width x 0.1cm depth; 4.241cm^2 area and 0.424cm^3 volume. There is Fat Layer (Subcutaneous Tissue) exposed. There is no tunneling or undermining noted. There is a medium amount of serosanguineous drainage noted. The wound margin is flat and intact. There is large (67-100%) red granulation within the wound bed. There is a small (1-33%) amount of necrotic tissue within the wound bed including Adherent Slough. Wound #6 status is Open. Original cause of wound was Blister. The date acquired was: 06/27/2020. The wound is located on the Left,Medial Lower Leg. The wound measures 3.5cm length x 1.7cm width x 0.1cm depth; 4.673cm^2 area and 0.467cm^3 volume. There is Fat Layer (Subcutaneous Tissue) exposed. There is no tunneling or undermining noted. There is a medium amount of  serosanguineous drainage noted. The wound margin is flat and intact. There is large (67-100%) red granulation within the wound bed. There is a small (1-33%) amount of necrotic tissue within the wound bed including Adherent Slough. Assessment Active Problems ICD-10 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ity Venous insufficiency (chronic) (peripheral) Other persistent atrial fibrillation Long term (current) use of anticoagulants 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 failure Chronic gout, unspecified, with tophus (tophi) Chronic kidney disease, stage 4 (severe) Patient presents 2 weeks after being discharged from our clinic for new wounds to his left lower extremity. These are venous stasis related. I am not quite sure why his [name] wrap is not giving him enough edema control since he was healed by using Kerlix/Coban in  our office. We will go ahead and use the same treatment we used before that worked which was [employer] and Kerlix/Coban. He will follow-up in 1 week. There were no signs of infection on exam. We will ask him to bring his Farrow wrap at next clinic visit Plan Follow-up Appointments: Return Appointment in 1 week. - Dr. Heber [place] Bathing/ Shower/ Hygiene: May shower with protection but do not get wound dressing(s) wet. - use cast protector Edema Control - Lymphedema / SCD / Other: Elevate legs to the level of the heart or above for 30 minutes daily and/or when sitting, a frequency of: - throughout the day Avoid standing for long periods of time. Exercise regularly WOUND #5: - Lower Leg Wound Laterality: Left, Anterior, Distal Cleanser: Soap and Water 1 x Per Week/7 Days Discharge Instructions: May shower and wash wound with dial antibacterial soap and water prior to dressing change. Cleanser: Wound Cleanser 1 x Per Week/7 Days Discharge Instructions: Cleanse the wound with wound cleanser prior to  applying a clean dressing using gauze sponges, not tissue or cotton balls. Peri-Wound Care: Zinc Oxide Ointment 30g tube 1 x Per Week/7 Days Discharge Instructions: Apply Zinc Oxide to periwound with each dressing change Peri-Wound Care: Sween Lotion (Moisturizing lotion) 1 x Per Week/7 Days Discharge Instructions: Apply moisturizing lotion as directed Prim Dressing: Promogran Prisma Matrix, 4.34 (sq in) (silver collagen) 1 x Per Week/7 Days ary Discharge Instructions: Moisten collagen with saline or hydrogel Secondary Dressing: Woven Gauze Sponge, Non-Sterile 4x4 in 1 x Per Week/7 Days Discharge Instructions: Apply over primary dressing as directed. Secondary Dressing: ABD Pad, 5x9 1 x Per Week/7 Days Discharge Instructions: Apply over primary dressing as directed. Com pression Wrap: Kerlix Roll 4.5x3.1 (in/yd) 1 x Per Week/7 Days Discharge Instructions: Apply Kerlix and Coban compression as directed. Com pression Wrap: Coban Self-Adherent Wrap 4x5 (in/yd) 1 x Per Week/7 Days Discharge Instructions: Apply over Kerlix as directed. WOUND #6: - Lower Leg Wound Laterality: Left, Medial Cleanser: Soap and Water 1 x Per Week/7 Days Discharge Instructions: May shower and wash wound with dial antibacterial soap and water prior to dressing change. Cleanser: Wound Cleanser 1 x Per Week/7 Days Discharge Instructions: Cleanse the wound with wound cleanser prior to applying a clean dressing using gauze sponges, not tissue or cotton balls. Peri-Wound Care: Zinc Oxide Ointment 30g tube 1 x Per Week/7 Days Discharge Instructions: Apply Zinc Oxide to periwound with each dressing change Peri-Wound Care: Sween Lotion (Moisturizing lotion) 1 x Per Week/7 Days Discharge Instructions: Apply moisturizing lotion as directed Prim Dressing: Promogran Prisma Matrix, 4.34 (sq in) (silver collagen) 1 x Per Week/7 Days ary Discharge Instructions: Moisten collagen with saline or hydrogel Secondary Dressing: Woven  Gauze Sponge, Non-Sterile 4x4 in 1 x Per Week/7 Days Discharge Instructions: Apply over primary dressing as directed. Secondary Dressing: ABD Pad, 5x9 1 x Per Week/7 Days Discharge Instructions: Apply over primary dressing as directed. Com pression Wrap: Kerlix Roll 4.5x3.1 (in/yd) 1 x Per Week/7 Days Discharge Instructions: Apply Kerlix and Coban compression as directed. Com pression Wrap: Coban Self-Adherent Wrap 4x5 (in/yd) 1 x Per Week/7 Days Discharge Instructions: Apply over Kerlix as directed. 1. Kerlix/Coban with Prisma 2. Follow-up in 1 week Electronic Signature(s) Signed: 06/30/2020 5:02:08 PM By: Kalman Shan DO Entered By: Kalman Shan on 06/30/2020 17:01:12 -------------------------------------------------------------------------------- HxROS Details Patient Name: Date of Service: Harry Foster 06/30/2020 3:15 PM Medical Record Number: [id number] Patient Account Number: [id number] Date of Birth/Sex: Treating RN: 03-13-1945 (75 y.o. M)  Rhae Hammock Primary Care Provider: [name] Other Clinician: Referring Provider: Treating Provider/Extender: [name] in Treatment: 0 Information Obtained From Patient Constitutional Symptoms (General Health) Medical History: Past Medical History Notes: morbid obesity Eyes Medical History: Positive for: Cataracts - mild Negative for: Glaucoma; Optic Neuritis Ear/Nose/Mouth/Throat Medical History: Negative for: Chronic sinus problems/congestion; Middle ear problems Cardiovascular Medical History: Positive for: Arrhythmia - afib; Congestive Heart Failure; Hypertension; Peripheral Venous Disease Endocrine Medical History: Negative for: Type I Diabetes; Type II Diabetes Genitourinary Medical History: Negative for: End Stage Renal Disease Past Medical History Notes: CKD stage 3 Integumentary (Skin) Medical History: Negative for: History of  Burn Musculoskeletal Medical History: Positive for: Gout; Osteoarthritis Negative for: Rheumatoid Arthritis; Osteomyelitis Oncologic Medical History: Negative for: Received Chemotherapy; Received Radiation Psychiatric Medical History: Negative for: Anorexia/bulimia; Confinement Anxiety HBO Extended History Items Eyes: Cataracts Immunizations Pneumococcal Vaccine: Received Pneumococcal Vaccination: Yes Implantable Devices No devices added Hospitalization / Surgery History Type of Hospitalization/Surgery bil knee replacements Family and Social History Cancer: Yes - Mother,Siblings; Diabetes: No; Heart Disease: Yes - Father; Hereditary Spherocytosis: No; Hypertension: No; Kidney Disease: No; Lung Disease: No; Seizures: No; Stroke: No; Thyroid Problems: No; Tuberculosis: No; Never smoker; Marital Status - Widowed; Alcohol Use: Never; Drug Use: No History; Caffeine Use: Daily - coffee; Financial Concerns: No; Food, Clothing or Shelter Needs: No; Support System Lacking: No; Transportation Concerns: No Electronic Signature(s) Signed: 06/30/2020 5:02:08 PM By: Kalman Shan DO Signed: 06/30/2020 5:32:03 PM By: Rhae Hammock RN Entered By: Kalman Shan on 06/30/2020 16:58:38 -------------------------------------------------------------------------------- [place] Details Patient Name: Date of Service: Harry Foster 06/30/2020 Medical Record Number: [id number] Patient Account Number: [id number] Date of Birth/Sex: Treating RN: November 10, 1945 (75 y.o. [name], [name] Primary Care Provider: [name] Other Clinician: Referring Provider: Treating Provider/Extender: [name] in Treatment: 0 Diagnosis Coding ICD-10 Codes Code Description [phone] Non-pressure chronic ulcer of unspecified part of right lower leg with unspecified severity L97.829 Non-pressure chronic ulcer of other part of left lower leg with unspecified  severity I87.2 Venous insufficiency (chronic) (peripheral) I48.19 Other persistent atrial fibrillation Z79.01 Long term (current) use of anticoagulants I50.40 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 failure M1A.9XX1 Chronic gout, unspecified, 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, stage 4 (severe) Facility Procedures CPT4 Code: [id number] Description: 16945 - WOUND CARE VISIT-LEV 3 EST PT Modifier: Quantity: 1 Physician Procedures : CPT4 Code Description Modifier [id number] - WC PHYS LEVEL 3 - EST PT ICD-10 Diagnosis Description L97.829 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ity I87.2 Venous insufficiency (chronic) (peripheral) N18.4  Chronic kidney disease, stage 4 (severe) I50.40 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 failure Quantity: 1 Electronic Signature(s) Signed: 06/30/2020 5:02:08 PM By: Kalman Shan DO Entered By: Kalman Shan on 06/30/2020 17:01:44

## 2020-07-05 NOTE — Progress Notes (Signed)
ONOFRE, GAINS ([id number]) Visit Report for 06/30/2020 Arrival Information Details Patient Name: Date of Service: Harry Foster 06/30/2020 3:15 PM Medical Record Number: [id number] Patient Account Number: [id number] Date of Birth/Sex: Treating RN: [birth year] (75 y.o. Harry Foster Primary Care Akiera Allbaugh: [name] Other Clinician: Referring Parneet Glantz: Treating Sparsh Callens/Extender: [name] in Treatment: 0 Visit Information History Since Last Visit Added or deleted any medications: No Patient Arrived: Ambulatory Any new allergies or adverse reactions: No Arrival Time: 16:01 Had a fall or experienced change in No Accompanied By: alone activities of daily living that may affect Transfer Assistance: None risk of falls: Patient Identification Verified: Yes Signs or symptoms of abuse/neglect since last visito No Secondary Verification Process Completed: Yes Hospitalized since last visit: No Patient Requires Transmission-Based Precautions: No Implantable device outside of the clinic excluding No Patient Has Alerts: Yes cellular tissue based products placed in the center Patient Alerts: R ABI: 1.63 TBI: 1.36 since last visit: L ABI: 1.49 TBI: 1.34 Has Dressing in Place as Prescribed: Yes 05/2020 Has Compression in Place as Prescribed: Yes Pain Present Now: No Electronic Signature(s) Signed: 07/05/2020 7:10:28 PM By: Levan [name] RN, BSN Entered By: Levan [name] on 06/30/2020 16:02:10 -------------------------------------------------------------------------------- Clinic Level of Care Assessment Details Patient Name: Date of Service: Harry Foster 06/30/2020 3:15 PM Medical Record Number: [id number] Patient Account Number: [id number] Date of Birth/Sex: Treating RN: 02-24-1945 (75 y.o. [name], [name] Primary Care Dequarius Jeffries: [name] Other Clinician: Referring Jelissa Espiritu: Treating Griselle Rufer/Extender: [name] in Treatment: 0 Clinic Level of Care Assessment Items TOOL 4 Quantity Score X- 1 0 Use when only an EandM is performed on FOLLOW-UP visit ASSESSMENTS - Nursing Assessment / Reassessment []  - 0 Reassessment of Co-morbidities (includes updates in patient status) []  - 0 Reassessment of Adherence to Treatment Plan ASSESSMENTS - Wound and Skin A ssessment / Reassessment []  - 0 Simple Wound Assessment / Reassessment - one wound X- 2 5 Complex Wound Assessment / Reassessment - multiple wounds []  - 0 Dermatologic / Skin Assessment (not related to wound area) ASSESSMENTS - Focused Assessment X- 1 5 Circumferential Edema Measurements - multi extremities []  - 0 Nutritional Assessment / Counseling / Intervention []  - 0 Lower Extremity Assessment (monofilament, tuning fork, pulses) []  - 0 Peripheral Arterial Disease Assessment (using hand held doppler) ASSESSMENTS - Ostomy and/or Continence Assessment and Care []  - 0 Incontinence Assessment and Management []  - 0 Ostomy Care Assessment and Management (repouching, etc.) PROCESS - Coordination of Care []  - 0 Simple Patient / Family Education for ongoing care X- 1 20 Complex (extensive) Patient / Family Education for ongoing care X- 1 10 Staff obtains Programmer, systems, Records, T Results / Process Orders est []  - 0 Staff telephones HHA, Nursing Homes / Clarify orders / etc []  - 0 Routine Transfer to another Facility (non-emergent condition) []  - 0 Routine Hospital Admission (non-emergent condition) X- 1 15 New Admissions / Biomedical engineer / Ordering NPWT Apligraf, etc. , []  - 0 Emergency Hospital Admission (emergent condition) X- 1 10 Simple Discharge Coordination []  - 0 Complex (extensive) Discharge Coordination PROCESS - Special Needs []  - 0 Pediatric / Minor Patient Management []  - 0 Isolation Patient Management []  - 0 Hearing / Language / Visual special needs []  - 0 Assessment of  Community assistance (transportation, D/C planning, etc.) []  - 0 Additional assistance / Altered mentation []  - 0 Support Surface(s) Assessment (bed, cushion, seat, etc.) INTERVENTIONS -  Wound Cleansing / Measurement []  - 0 Simple Wound Cleansing - one wound X- 2 5 Complex Wound Cleansing - multiple wounds X- 1 5 Wound Imaging (photographs - any number of wounds) []  - 0 Wound Tracing (instead of photographs) []  - 0 Simple Wound Measurement - one wound X- 2 5 Complex Wound Measurement - multiple wounds INTERVENTIONS - Wound Dressings []  - 0 Small Wound Dressing one or multiple wounds X- 1 15 Medium Wound Dressing one or multiple wounds []  - 0 Large Wound Dressing one or multiple wounds []  - 0 Application of Medications - topical []  - 0 Application of Medications - injection INTERVENTIONS - Miscellaneous []  - 0 External ear exam []  - 0 Specimen Collection (cultures, biopsies, blood, body fluids, etc.) []  - 0 Specimen(s) / Culture(s) sent or taken to Lab for analysis []  - 0 Patient Transfer (multiple staff / Civil Service fast streamer / Similar devices) []  - 0 Simple Staple / Suture removal (25 or less) []  - 0 Complex Staple / Suture removal (26 or more) []  - 0 Hypo / Hyperglycemic Management (close monitor of Blood Glucose) []  - 0 Ankle / Brachial Index (ABI) - do not check if billed separately X- 1 5 Vital Signs Has the patient been seen at the hospital within the last three years: Yes Total Score: 115 Level Of Care: New/Established - Level 3 Electronic Signature(s) Signed: 06/30/2020 5:32:03 PM By: Rhae Hammock RN Entered By: Rhae Hammock on 06/30/2020 16:40:26 -------------------------------------------------------------------------------- Encounter Discharge Information Details Patient Name: Date of Service: Harry Foster 06/30/2020 3:15 PM Medical Record Number: [id number] Patient Account Number: [id number] Date of Birth/Sex: Treating RN: 1945/12/04 (75 y.o.  Harry Foster Primary Care Willella Harding: [name] Other Clinician: Referring Irini Leet: Treating Morene Cecilio/Extender: [name] in Treatment: 0 Encounter Discharge Information Items Discharge Condition: Stable Ambulatory Status: Ambulatory Discharge Destination: Home Transportation: Private Auto Accompanied By: alone Schedule Follow-up Appointment: Yes Clinical Summary of Care: Patient Declined Electronic Signature(s) Signed: 07/05/2020 7:10:28 PM By: Levan [name] RN, BSN Entered By: Levan [name] on 06/30/2020 17:22:07 -------------------------------------------------------------------------------- Lower Extremity Assessment Details Patient Name: Date of Service: Harry Foster 06/30/2020 3:15 PM Medical Record Number: [id number] Patient Account Number: [id number] Date of Birth/Sex: Treating RN: [birth year] (75 y.o. Harry Foster Primary Care Adylee Leonardo: [name] Other Clinician: Referring Owen Pagnotta: Treating Baden Betsch/Extender: [name] in Treatment: 0 Edema Assessment Assessed: [Left: No] [Right: No] Edema: [Left: Ye] [Right: s] Calf Left: Right: Point of Measurement: From Medial Instep 45.5 cm Ankle Left: Right: Point of Measurement: From Medial Instep 27.5 cm Vascular Assessment Pulses: Dorsalis Pedis Palpable: [Left:Yes] Electronic Signature(s) Signed: 07/05/2020 7:10:28 PM By: Levan [name] RN, BSN Entered By: Levan [name] on 06/30/2020 16:11:58 -------------------------------------------------------------------------------- Multi Wound Chart Details Patient Name: Date of Service: Harry Foster 06/30/2020 3:15 PM Medical Record Number: [id number] Patient Account Number: [id number] Date of Birth/Sex: Treating RN: 06-May-1945 (75 y.o. [name], [name] Primary Care Zoii Florer: [name] Other Clinician: Referring Canyon Lohr: Treating Luane Rochon/Extender:  [name] in Treatment: 0 Vital Signs Height(in): 71 Pulse(bpm): 18 Weight(lbs): 497 Blood Pressure(mmHg): 115/76 Body Mass Index(BMI): 41 Temperature(F): 98.4 Respiratory Rate(breaths/min): 16 Photos: [5:No Photos Left, Distal, Anterior Lower Leg] [6:No Photos Left, Medial Lower Leg] [N/A:N/A N/A] Wound Location: [5:Blister] [6:Blister] [N/A:N/A] Wounding Event: [5:Venous Leg Ulcer] [6:Venous Leg Ulcer] [N/A:N/A] Primary Etiology: [5:Cataracts, Arrhythmia, Congestive Cataracts, Arrhythmia, Congestive N/A] Comorbid History: [5:Heart Failure, Hypertension, Peripheral Heart Failure, Hypertension,  Peripheral Venous Disease, Gout, Osteoarthritis Venous Disease, Gout, Osteoarthritis 06/27/2020] [6:06/27/2020] [N/A:N/A] Date Acquired: [5:0] [6:0] [N/A:N/A] Weeks of Treatment: [5:Open] [6:Open] [N/A:N/A] Wound Status: [5:1.8x3x0.1] [6:3.5x1.7x0.1] [N/A:N/A] Measurements L x W x D (cm) [5:4.241] [6:4.673] [N/A:N/A] A (cm) : rea [5:0.424] [6:0.467] [N/A:N/A] Volume (cm) : [5:Full Thickness Without Exposed] [6:Full Thickness Without Exposed] [N/A:N/A] Classification: [5:Support Structures Medium] [6:Support Structures Medium] [N/A:N/A] Exudate Amount: [5:Serosanguineous] [6:Serosanguineous] [N/A:N/A] Exudate Type: [5:red, brown] [6:red, brown] [N/A:N/A] Exudate Color: [5:Flat and Intact] [6:Flat and Intact] [N/A:N/A] Wound Margin: [5:Large (67-100%)] [6:Large (67-100%)] [N/A:N/A] Granulation Amount: [5:Red] [6:Red] [N/A:N/A] Granulation Quality: [5:Small (1-33%)] [6:Small (1-33%)] [N/A:N/A] Necrotic Amount: [5:Fat Layer (Subcutaneous Tissue): Yes Fat Layer (Subcutaneous Tissue): Yes N/A] Exposed Structures: [5:Fascia: No Tendon: No Muscle: No Joint: No Bone: No None] [6:Fascia: No Tendon: No Muscle: No Joint: No Bone: No None] [N/A:N/A] Treatment Notes Electronic Signature(s) Signed: 06/30/2020 5:02:08 PM By: Kalman Shan DO Signed: 06/30/2020 5:32:03  PM By: Rhae Hammock RN Entered By: Kalman Shan on 06/30/2020 16:57:00 -------------------------------------------------------------------------------- Multi-Disciplinary Care Plan Details Patient Name: Date of Service: Harry Foster 06/30/2020 3:15 PM Medical Record Number: [id number] Patient Account Number: [id number] Date of Birth/Sex: Treating RN: [birth year] (75 y.o. 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 Compression as ordered Provide education on venous insufficiency Notes: Wound/Skin Impairment Nursing Diagnoses: Impaired tissue integrity Knowledge deficit related to ulceration/compromised skin integrity Goals: Patient/caregiver will verbalize understanding of skin care regimen Date Initiated: 05/16/2020 Target Resolution Date: 07/02/2020 Goal Status: Active Ulcer/skin breakdown will have a volume reduction of 30% by week 4 Date Initiated: 05/16/2020 Date Inactivated: 06/14/2020 Target Resolution Date: 06/17/2020 Goal Status: Met Interventions: Assess patient/caregiver ability to obtain necessary supplies Assess patient/caregiver ability to perform ulcer/skin care regimen upon admission and as needed Assess ulceration(s) every  visit Provide education on ulcer and skin care Notes: Electronic Signature(s) Signed: 06/30/2020 5:32:03 PM By: Rhae Hammock RN Entered By: Rhae Hammock on 06/30/2020 16:34:37 -------------------------------------------------------------------------------- Pain Assessment Details Patient Name: Date of Service: Harry Foster 06/30/2020 3:15 PM Medical Record Number: [id number] Patient Account Number: [id number] Date of Birth/Sex: Treating RN: Jul 12, 1945 (75 y.o. Harry Foster Primary Care Gelena Klosinski: [name] Other Clinician: Referring Zedric Deroy: Treating Ladarrius Bogdanski/Extender: [name] in Treatment: 0 Active Problems Location of Pain Severity and Description of Pain Patient Has Paino No Site Locations Pain Management and Medication Current Pain Management: Electronic Signature(s) Signed: 07/05/2020 7:10:28 PM By: Levan [name] RN, BSN Entered By: Levan [name] on 06/30/2020 16:02:39 -------------------------------------------------------------------------------- Patient/Caregiver Education Details Patient Name: Date of Service: Harry Foster 6/30/2022andnbsp3:15 PM Medical Record Number: [id number] Patient Account Number: [id number] Date of Birth/Gender: Treating RN: 09/26/1945 (75 y.o. Erie Noe Primary Care Physician: [name] Other Clinician: Referring Physician: Treating Physician/Extender: [name] in Treatment: 0 Education Assessment Education Provided To: Patient Education Topics Provided Venous: Methods: Explain/Verbal Responses: State content correctly Electronic Signature(s) Signed: 06/30/2020 5:32:03 PM By: Rhae Hammock RN  Entered By: Rhae Hammock on 06/30/2020 16:34:52 -------------------------------------------------------------------------------- Wound Assessment Details Patient Name: Date of Service: Harry Foster  06/30/2020 3:15 PM Medical Record Number: [id number] Patient Account Number: [id number] Date of Birth/Sex: Treating RN: 1945-07-07 (75 y.o. Harry Foster Primary Care Dhruvi Crenshaw: [name] Other Clinician: Referring Mizuki Hoel: Treating Taevyn Hausen/Extender: [name] in Treatment: 0 Wound Status Wound Number: 5 Primary Venous Leg Ulcer Etiology: Wound Location: Left, Distal, Anterior Lower Leg Wound Open Wounding Event: Blister Status: Date Acquired: 06/27/2020 Comorbid Cataracts, Arrhythmia, Congestive Heart Failure, Hypertension, Weeks Of Treatment: 0 History: Peripheral Venous Disease, Gout, Osteoarthritis Clustered Wound: No Photos Photo Uploaded By: [name] on 06/30/2020 17:26:14 Wound Measurements Length: (cm) 1.8 Width: (cm) 3 Depth: (cm) 0.1 Area: (cm) 4.241 Volume: (cm) 0.424 % Reduction in Area: % Reduction in Volume: Epithelialization: None Tunneling: No Undermining: No Wound Description Classification: Full Thickness Without Exposed Support Structures Wound Margin: Flat and Intact Exudate Amount: Medium Exudate Type: Serosanguineous Exudate Color: red, brown Foul Odor After Cleansing: No Slough/Fibrino Yes Wound Bed Granulation Amount: Large (67-100%) Exposed Structure Granulation Quality: Red Fascia Exposed: No Necrotic Amount: Small (1-33%) Fat Layer (Subcutaneous Tissue) Exposed: Yes Necrotic Quality: Adherent Slough Tendon Exposed: No Muscle Exposed: No Joint Exposed: No Bone Exposed: No Treatment Notes Wound #5 (Lower Leg) Wound Laterality: Left, Anterior, Distal Cleanser Soap and Water Discharge Instruction: May shower and wash wound with dial antibacterial soap and water prior to dressing change. Wound Cleanser Discharge Instruction: Cleanse the wound with wound cleanser prior to applying a clean dressing using gauze sponges, not tissue or cotton balls. Peri-Wound Care Zinc Oxide  Ointment 30g tube Discharge Instruction: Apply Zinc Oxide to periwound with each dressing change Sween Lotion (Moisturizing lotion) Discharge Instruction: Apply moisturizing lotion as directed Topical Primary Dressing Promogran Prisma Matrix, 4.34 (sq in) (silver collagen) Discharge Instruction: Moisten collagen with saline or hydrogel Secondary Dressing Woven Gauze Sponge, Non-Sterile 4x4 in Discharge Instruction: Apply over primary dressing as directed. ABD Pad, 5x9 Discharge Instruction: Apply over primary dressing as directed. Secured With Compression Wrap Kerlix Roll 4.5x3.1 (in/yd) Discharge Instruction: Apply Kerlix and Coban compression as directed. Coban Self-Adherent Wrap 4x5 (in/yd) Discharge Instruction: Apply over Kerlix as directed. Compression Stockings Add-Ons Electronic Signature(s) Signed: 07/05/2020 7:10:28 PM By: Levan [name] RN, BSN Entered By: Levan [name] on 06/30/2020 16:13:11 -------------------------------------------------------------------------------- Wound Assessment Details Patient Name: Date of Service: Harry Foster 06/30/2020 3:15 PM Medical Record Number: [id number] Patient Account Number: [id number] Date of Birth/Sex: Treating RN: [birth year] (75 y.o. Harry Foster Primary Care Lezlie Ritchey: [name] Other Clinician: Referring Keshawn Fiorito: Treating Jennalynn Rivard/Extender: [name] in Treatment: 0 Wound Status Wound Number: 6 Primary Venous Leg Ulcer Etiology: Wound Location: Left, Medial Lower Leg Wound Open Wounding Event: Blister Status: Date Acquired: 06/27/2020 Comorbid Cataracts, Arrhythmia, Congestive Heart Failure, Hypertension, Weeks Of Treatment: 0 History: Peripheral Venous Disease, Gout, Osteoarthritis Clustered Wound: No Photos Photo Uploaded By: [name] on 06/30/2020 17:26:15 Wound Measurements Length: (cm) 3.5 Width: (cm) 1.7 Depth: (cm) 0.1 Area: (cm)  4.673 Volume: (cm) 0.467 % Reduction in Area: % Reduction in Volume: Epithelialization: None Tunneling: No Undermining: No Wound Description Classification: Full Thickness Without Exposed Support Structures Wound Margin: Flat and Intact Exudate Amount: Medium Exudate Type: Serosanguineous Exudate Color: red, brown Foul Odor After Cleansing: No Slough/Fibrino Yes Wound Bed Granulation Amount: Large (67-100%) Exposed Structure Granulation Quality: Red Fascia Exposed: No Necrotic Amount: Small (1-33%) Fat Layer (Subcutaneous Tissue)  Exposed: Yes Necrotic Quality: Adherent Slough Tendon Exposed: No Muscle Exposed: No Joint Exposed: No Bone Exposed: No Treatment Notes Wound #6 (Lower Leg) Wound Laterality: Left, Medial Cleanser Soap and Water Discharge Instruction: May shower and wash wound with dial antibacterial soap and water prior to dressing change. Wound Cleanser Discharge Instruction: Cleanse the wound with wound cleanser prior to applying a clean dressing using gauze sponges, not tissue or cotton balls. Peri-Wound Care Zinc Oxide Ointment 30g tube Discharge Instruction: Apply Zinc Oxide to periwound with each dressing change Sween Lotion (Moisturizing lotion) Discharge Instruction: Apply moisturizing lotion as directed Topical Primary Dressing Promogran Prisma Matrix, 4.34 (sq in) (silver collagen) Discharge Instruction: Moisten collagen with saline or hydrogel Secondary Dressing Woven Gauze Sponge, Non-Sterile 4x4 in Discharge Instruction: Apply over primary dressing as directed. ABD Pad, 5x9 Discharge Instruction: Apply over primary dressing as directed. Secured With Compression Wrap Kerlix Roll 4.5x3.1 (in/yd) Discharge Instruction: Apply Kerlix and Coban compression as directed. Coban Self-Adherent Wrap 4x5 (in/yd) Discharge Instruction: Apply over Kerlix as directed. Compression Stockings Add-Ons Electronic Signature(s) Signed: 07/05/2020 7:10:28 PM By:  Levan [name] RN, BSN Entered By: Levan [name] on 06/30/2020 16:14:13 -------------------------------------------------------------------------------- [place] Details Patient Name: Date of Service: Harry Foster 06/30/2020 3:15 PM Medical Record Number: [id number] Patient Account Number: [id number] Date of Birth/Sex: Treating RN: 31-Jan-1945 (75 y.o. Harry Foster Primary Care Ayvion Kavanagh: [name] Other Clinician: Referring Arianni Gallego: Treating Birl Lobello/Extender: [name] in Treatment: 0 Vital Signs Time Taken: 16:01 Temperature (F): 98.4 Height (in): 71 Pulse (bpm): 89 Weight (lbs): 295 Respiratory Rate (breaths/min): 16 Body Mass Index (BMI): 41.1 Blood Pressure (mmHg): 115/76 Reference Range: 80 - 120 mg / dl Electronic Signature(s) Signed: 07/05/2020 7:10:28 PM By: Levan [name] RN, BSN Entered By: Levan [name] on 06/30/2020 16:02:31

## 2020-07-07 ENCOUNTER — Other Ambulatory Visit: Payer: Self-pay | Admitting: Interventional Cardiology

## 2020-07-07 DIAGNOSIS — I4819 Other persistent atrial fibrillation: Secondary | ICD-10-CM

## 2020-07-08 ENCOUNTER — Encounter (HOSPITAL_BASED_OUTPATIENT_CLINIC_OR_DEPARTMENT_OTHER): Payer: Medicare Other | Attending: Internal Medicine | Admitting: Internal Medicine

## 2020-07-08 ENCOUNTER — Other Ambulatory Visit: Payer: Self-pay

## 2020-07-08 DIAGNOSIS — I130 Hypertensive heart and chronic kidney disease with heart failure and stage 1 through stage 4 chronic kidney disease, or unspecified chronic kidney disease: Secondary | ICD-10-CM | POA: Diagnosis not present

## 2020-07-08 DIAGNOSIS — I872 Venous insufficiency (chronic) (peripheral): Secondary | ICD-10-CM | POA: Insufficient documentation

## 2020-07-08 DIAGNOSIS — N184 Chronic kidney disease, stage 4 (severe): Secondary | ICD-10-CM | POA: Diagnosis not present

## 2020-07-08 DIAGNOSIS — M1A9XX1 Chronic gout, unspecified, with tophus (tophi): Secondary | ICD-10-CM | POA: Diagnosis not present

## 2020-07-08 DIAGNOSIS — I4819 Other persistent atrial fibrillation: Secondary | ICD-10-CM | POA: Diagnosis not present

## 2020-07-08 DIAGNOSIS — L97919 Non-pressure chronic ulcer of unspecified part of right lower leg with unspecified severity: Secondary | ICD-10-CM | POA: Insufficient documentation

## 2020-07-08 DIAGNOSIS — I890 Lymphedema, not elsewhere classified: Secondary | ICD-10-CM | POA: Diagnosis not present

## 2020-07-08 DIAGNOSIS — L97929 Non-pressure chronic ulcer of unspecified part of left lower leg with unspecified severity: Secondary | ICD-10-CM | POA: Diagnosis not present

## 2020-07-08 DIAGNOSIS — I504 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 failure: Secondary | ICD-10-CM | POA: Diagnosis not present

## 2020-07-08 DIAGNOSIS — Z7901 Long term (current) use of anticoagulants: Secondary | ICD-10-CM | POA: Insufficient documentation

## 2020-07-08 NOTE — Progress Notes (Signed)
SAMRAT, HAYWARD ([id number]) Visit Report for 07/08/2020 Arrival Information Details Patient Name: Date of Service: Harry Foster 07/08/2020 3:15 PM Medical Record Number: [id number] Patient Account Number: [id number] Date of Birth/Sex: Treating RN: 07-Sep-1945 (75 y.o. [name], [name] Primary Care Rotha Cassels: [name] Other Clinician: Referring Nyima Vanacker: Treating Sadiyah Kangas/Extender: [name] in Treatment: 7 Visit Information History Since Last Visit Added or deleted any medications: No Patient Arrived: Ambulatory Any new allergies or adverse reactions: No Arrival Time: 15:17 Had a fall or experienced change in No Accompanied By: self activities of daily living that may affect Transfer Assistance: None risk of falls: Patient Identification Verified: Yes Signs or symptoms of abuse/neglect since last visito No Secondary Verification Process Completed: Yes Hospitalized since last visit: No Patient Requires Transmission-Based Precautions: No Implantable device outside of the clinic excluding No Patient Has Alerts: Yes cellular tissue based products placed in the center Patient Alerts: R ABI: 1.63 TBI: 1.36 since last visit: L ABI: 1.49 TBI: 1.34 Has Dressing in Place as Prescribed: Yes 05/2020 Pain Present Now: No Electronic Signature(s) Signed: 07/08/2020 5:38:08 PM By: Rhae Hammock RN Entered By: Rhae Hammock on 07/08/2020 17:10:20 -------------------------------------------------------------------------------- Clinic Level of Care Assessment Details Patient Name: Date of Service: Harry Foster 07/08/2020 3:15 PM Medical Record Number: [id number] Patient Account Number: [id number] Date of Birth/Sex: Treating RN: 07-29-1945 (75 y.o. [name], [name] Primary Care Cyndy Braver: [name] Other Clinician: Referring Teaira Croft: Treating Kylynn Street/Extender: [name] in  Treatment: 7 Clinic Level of Care Assessment Items TOOL 4 Quantity Score X- 1 0 Use when only an EandM is performed on FOLLOW-UP visit ASSESSMENTS - Nursing Assessment / Reassessment X- 1 10 Reassessment of Co-morbidities (includes updates in patient status) X- 1 5 Reassessment of Adherence to Treatment Plan ASSESSMENTS - Wound and Skin A ssessment / Reassessment X - Simple Wound Assessment / Reassessment - one wound 1 5 []  - 0 Complex Wound Assessment / Reassessment - multiple wounds X- 1 10 Dermatologic / Skin Assessment (not related to wound area) ASSESSMENTS - Focused Assessment []  - 0 Circumferential Edema Measurements - multi extremities []  - 0 Nutritional Assessment / Counseling / Intervention []  - 0 Lower Extremity Assessment (monofilament, tuning fork, pulses) []  - 0 Peripheral Arterial Disease Assessment (using hand held doppler) ASSESSMENTS - Ostomy and/or Continence Assessment and Care []  - 0 Incontinence Assessment and Management []  - 0 Ostomy Care Assessment and Management (repouching, etc.) PROCESS - Coordination of Care []  - 0 Simple Patient / Family Education for ongoing care X- 1 20 Complex (extensive) Patient / Family Education for ongoing care X- 1 10 Staff obtains Programmer, systems, Records, T Results / Process Orders est []  - 0 Staff telephones HHA, Nursing Homes / Clarify orders / etc []  - 0 Routine Transfer to another Facility (non-emergent condition) []  - 0 Routine Hospital Admission (non-emergent condition) []  - 0 New Admissions / Biomedical engineer / Ordering NPWT Apligraf, etc. , []  - 0 Emergency Hospital Admission (emergent condition) []  - 0 Simple Discharge Coordination X- 1 15 Complex (extensive) Discharge Coordination PROCESS - Special Needs []  - 0 Pediatric / Minor Patient Management []  - 0 Isolation Patient Management []  - 0 Hearing / Language / Visual special needs []  - 0 Assessment of Community assistance (transportation,  D/C planning, etc.) []  - 0 Additional assistance / Altered mentation []  - 0 Support Surface(s) Assessment (bed, cushion, seat, etc.) INTERVENTIONS - Wound Cleansing / Measurement []  -  0 Simple Wound Cleansing - one wound X- 1 5 Complex Wound Cleansing - multiple wounds []  - 0 Wound Imaging (photographs - any number of wounds) []  - 0 Wound Tracing (instead of photographs) []  - 0 Simple Wound Measurement - one wound X- 1 5 Complex Wound Measurement - multiple wounds INTERVENTIONS - Wound Dressings []  - 0 Small Wound Dressing one or multiple wounds []  - 0 Medium Wound Dressing one or multiple wounds X- 1 20 Large Wound Dressing one or multiple wounds []  - 0 Application of Medications - topical []  - 0 Application of Medications - injection INTERVENTIONS - Miscellaneous []  - 0 External ear exam []  - 0 Specimen Collection (cultures, biopsies, blood, body fluids, etc.) []  - 0 Specimen(s) / Culture(s) sent or taken to Lab for analysis []  - 0 Patient Transfer (multiple staff / Civil Service fast streamer / Similar devices) []  - 0 Simple Staple / Suture removal (25 or less) []  - 0 Complex Staple / Suture removal (26 or more) []  - 0 Hypo / Hyperglycemic Management (close monitor of Blood Glucose) []  - 0 Ankle / Brachial Index (ABI) - do not check if billed separately X- 1 5 Vital Signs Has the patient been seen at the hospital within the last three years: Yes Total Score: 110 Level Of Care: New/Established - Level 3 Electronic Signature(s) Signed: 07/08/2020 5:38:08 PM By: Rhae Hammock RN Entered By: Rhae Hammock on 07/08/2020 17:37:10 -------------------------------------------------------------------------------- Encounter Discharge Information Details Patient Name: Date of Service: Harry Foster 07/08/2020 3:15 PM Medical Record Number: [id number] Patient Account Number: [id number] Date of Birth/Sex: Treating RN: 1945-07-16 (75 y.o. [name], [name] Primary Care  Shaquanna Lycan: [name] Other Clinician: Referring Laticia Vannostrand: Treating Deavin Forst/Extender: [name] in Treatment: 7 Encounter Discharge Information Items Discharge Condition: Stable Ambulatory Status: Ambulatory Discharge Destination: Home Transportation: Private Auto Accompanied By: self Schedule Follow-up Appointment: Yes Clinical Summary of Care: Patient Declined Electronic Signature(s) Signed: 07/08/2020 5:38:08 PM By: Rhae Hammock RN Entered By: Rhae Hammock on 07/08/2020 17:12:21 -------------------------------------------------------------------------------- Patient/Caregiver Education Details Patient Name: Date of Service: Harry Foster 7/8/2022andnbsp3:15 PM Medical Record Number: [id number] Patient Account Number: [id number] Date of Birth/Gender: Treating RN: 1945/09/10 (75 y.o. Erie Noe Primary Care Physician: [name] Other Clinician: Referring Physician: Treating Physician/Extender: [name] in Treatment: 7 Education Assessment Education Provided To: Patient Education Topics Provided Venous: Methods: Explain/Verbal Responses: State content correctly Electronic Signature(s) Signed: 07/08/2020 5:38:08 PM By: Rhae Hammock RN Entered By: Rhae Hammock on 07/08/2020 17:11:53 -------------------------------------------------------------------------------- Wound Assessment Details Patient Name: Date of Service: Harry Foster 07/08/2020 3:15 PM Medical Record Number: [id number] Patient Account Number: [id number] Date of Birth/Sex: Treating RN: 1945-03-25 (75 y.o. [name], [name] Primary Care Jamiah Homeyer: [name] Other Clinician: Referring Lochlann Mastrangelo: Treating Elize Pinon/Extender: [name] in Treatment: 7 Wound Status Wound Number: 5 Primary Etiology: Venous Leg Ulcer Wound Location: Left,  Distal, Anterior Lower Leg Wound Status: Open Wounding Event: Blister Date Acquired: 06/27/2020 Weeks Of Treatment: 1 Clustered Wound: No Wound Measurements Length: (cm) 1.8 Width: (cm) 3 Depth: (cm) 0.1 Area: (cm) 4.241 Volume: (cm) 0.424 % Reduction in Area: 0% % Reduction in Volume: 0% Wound Description Classification: Full Thickness Without Exposed Support Structur es Treatment Notes Wound #5 (Lower Leg) Wound Laterality: Left, Anterior, Distal Cleanser Soap and Water Discharge Instruction: May shower and wash wound with dial antibacterial soap and water prior to dressing change. Wound Cleanser  Discharge Instruction: Cleanse the wound with wound cleanser prior to applying a clean dressing using gauze sponges, not tissue or cotton balls. Peri-Wound Care Zinc Oxide Ointment 30g tube Discharge Instruction: Apply Zinc Oxide to periwound with each dressing change Sween Lotion (Moisturizing lotion) Discharge Instruction: Apply moisturizing lotion as directed Topical Primary Dressing Promogran Prisma Matrix, 4.34 (sq in) (silver collagen) Discharge Instruction: Moisten collagen with saline or hydrogel Secondary Dressing Woven Gauze Sponge, Non-Sterile 4x4 in Discharge Instruction: Apply over primary dressing as directed. ABD Pad, 5x9 Discharge Instruction: Apply over primary dressing as directed. Secured With Compression Wrap Kerlix Roll 4.5x3.1 (in/yd) Discharge Instruction: Apply Kerlix and Coban compression as directed. Coban Self-Adherent Wrap 4x5 (in/yd) Discharge Instruction: Apply over Kerlix as directed. Compression Stockings Add-Ons Electronic Signature(s) Signed: 07/08/2020 5:38:08 PM By: Rhae Hammock RN Entered By: Rhae Hammock on 07/08/2020 17:10:45 -------------------------------------------------------------------------------- Wound Assessment Details Patient Name: Date of Service: Harry Foster 07/08/2020 3:15 PM Medical Record Number:  [id number] Patient Account Number: [id number] Date of Birth/Sex: Treating RN: 09-Jul-1945 (75 y.o. [name], [name] Primary Care Charne Mcbrien: [name] Other Clinician: Referring Brockton Mckesson: Treating Dain Laseter/Extender: [name] in Treatment: 7 Wound Status Wound Number: 6 Primary Etiology: Venous Leg Ulcer Wound Location: Left, Medial Lower Leg Wound Status: Open Wounding Event: Blister Date Acquired: 06/27/2020 Weeks Of Treatment: 1 Clustered Wound: No Wound Measurements Length: (cm) 3.5 Width: (cm) 1.7 Depth: (cm) 0.1 Area: (cm) 4.673 Volume: (cm) 0.467 % Reduction in Area: 0% % Reduction in Volume: 0% Wound Description Classification: Full Thickness Without Exposed Support Structur es Treatment Notes Wound #6 (Lower Leg) Wound Laterality: Left, Medial Cleanser Soap and Water Discharge Instruction: May shower and wash wound with dial antibacterial soap and water prior to dressing change. Wound Cleanser Discharge Instruction: Cleanse the wound with wound cleanser prior to applying a clean dressing using gauze sponges, not tissue or cotton balls. Peri-Wound Care Zinc Oxide Ointment 30g tube Discharge Instruction: Apply Zinc Oxide to periwound with each dressing change Sween Lotion (Moisturizing lotion) Discharge Instruction: Apply moisturizing lotion as directed Topical Primary Dressing Promogran Prisma Matrix, 4.34 (sq in) (silver collagen) Discharge Instruction: Moisten collagen with saline or hydrogel Secondary Dressing Woven Gauze Sponge, Non-Sterile 4x4 in Discharge Instruction: Apply over primary dressing as directed. ABD Pad, 5x9 Discharge Instruction: Apply over primary dressing as directed. Secured With Compression Wrap Kerlix Roll 4.5x3.1 (in/yd) Discharge Instruction: Apply Kerlix and Coban compression as directed. Coban Self-Adherent Wrap 4x5 (in/yd) Discharge Instruction: Apply over Kerlix as  directed. Compression Stockings Add-Ons Electronic Signature(s) Signed: 07/08/2020 5:38:08 PM By: Rhae Hammock RN Entered By: Rhae Hammock on 07/08/2020 17:10:55 -------------------------------------------------------------------------------- Vitals Details Patient Name: Date of Service: Harry Foster 07/08/2020 3:15 PM Medical Record Number: [id number] Patient Account Number: [id number] Date of Birth/Sex: Treating RN: 1945/10/28 (75 y.o. [name], [name] Primary Care Khian Remo: [name] Other Clinician: Referring Mae Cianci: Treating Ednah Hammock/Extender: [name] in Treatment: 7 Vital Signs Time Taken: 15:17 Temperature (F): 98.8 Height (in): 71 Pulse (bpm): 79 Weight (lbs): 295 Respiratory Rate (breaths/min): 17 Body Mass Index (BMI): 41.1 Blood Pressure (mmHg): 101/67 Reference Range: 80 - 120 mg / dl Electronic Signature(s) Signed: 07/08/2020 5:38:08 PM By: Rhae Hammock RN Entered By: Rhae Hammock on 07/08/2020 17:10:36

## 2020-07-10 NOTE — Progress Notes (Signed)
[name], [name] ([id number]) Visit Report for 07/08/2020 SuperBill Details Patient Name: Date of Service: Harry Foster 07/08/2020 Medical Record Number: [id number] Patient Account Number: [id number] Date of Birth/Sex: Treating RN: August 07, 1945 (75 y.o. 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

## 2020-07-12 ENCOUNTER — Encounter (HOSPITAL_BASED_OUTPATIENT_CLINIC_OR_DEPARTMENT_OTHER): Payer: Medicare Other | Admitting: Internal Medicine

## 2020-07-12 ENCOUNTER — Other Ambulatory Visit: Payer: Self-pay

## 2020-07-12 DIAGNOSIS — N184 Chronic kidney disease, stage 4 (severe): Secondary | ICD-10-CM | POA: Diagnosis not present

## 2020-07-12 DIAGNOSIS — L97919 Non-pressure chronic ulcer of unspecified part of right lower leg with unspecified severity: Secondary | ICD-10-CM | POA: Diagnosis not present

## 2020-07-12 DIAGNOSIS — I130 Hypertensive heart and chronic kidney disease with heart failure and stage 1 through stage 4 chronic kidney disease, or unspecified chronic kidney disease: Secondary | ICD-10-CM | POA: Diagnosis not present

## 2020-07-12 DIAGNOSIS — I872 Venous insufficiency (chronic) (peripheral): Secondary | ICD-10-CM | POA: Diagnosis not present

## 2020-07-12 DIAGNOSIS — M1A9XX1 Chronic gout, unspecified, with tophus (tophi): Secondary | ICD-10-CM | POA: Diagnosis not present

## 2020-07-12 DIAGNOSIS — Z7901 Long term (current) use of anticoagulants: Secondary | ICD-10-CM | POA: Diagnosis not present

## 2020-07-12 DIAGNOSIS — I4819 Other persistent atrial fibrillation: Secondary | ICD-10-CM | POA: Diagnosis not present

## 2020-07-12 DIAGNOSIS — L97929 Non-pressure chronic ulcer of unspecified part of left lower leg with unspecified severity: Secondary | ICD-10-CM | POA: Diagnosis not present

## 2020-07-12 DIAGNOSIS — I890 Lymphedema, not elsewhere classified: Secondary | ICD-10-CM | POA: Diagnosis not present

## 2020-07-12 DIAGNOSIS — I504 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 failure: Secondary | ICD-10-CM | POA: Diagnosis not present

## 2020-07-12 NOTE — Progress Notes (Signed)
TYKWON, FERA ([id number]) Visit Report for 07/12/2020 Chief Complaint Document Details Patient Name: Date of Service: Harry Foster 07/12/2020 2:00 PM Medical Record Number: [id number] Patient Account Number: [id number] Date of Birth/Sex: Treating RN: Nov 08, 1945 (75 y.o. [name] Primary Care Provider: [name] Other Clinician: Referring Provider: Treating Provider/Extender: [name] in Treatment: 8 Information Obtained from: Patient Chief Complaint Bilateral lower extremity wounds Electronic Signature(s) Signed: 07/12/2020 3:55:00 PM By: Kalman Shan DO Entered By: Kalman Shan on 07/12/2020 15:18:28 -------------------------------------------------------------------------------- HPI Details Patient Name: Date of Service: Harry Leventhal G. 07/12/2020 2:00 PM Medical Record Number: [id number] Patient Account Number: [id number] Date of Birth/Sex: Treating RN: 1945-12-26 (75 y.o. [name] Primary Care Provider: [name] Other Clinician: Referring Provider: Treating Provider/Extender: [name] in Treatment: 8 History of Present Illness HPI Description: Mr. Raynard Mapps III is a 75 year old male with a past medical history of persistent atrial fibrillation on Coumadin, hypertension, gout, CHF And venous insufficiency that presents to the clinic for bilateral lower extremity wounds. He states he has a history of blistering to his legs that eventually pop and heal. He states that in the past 6 weeks he has been unable to heal his wounds. He keeps them wrapped however they soak through quickly and has to change the wraps frequently. 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 He sleeps in a recliner at night and keeps his legs down due to increased pain when they are elevated. He states this symptom started a few weeks ago. He denies  signs of infection. 5/23; patient presents for 1 week follow-up. He had Kerlix/Coban wrap placed at last clinic visit and changed again last week due to increased drainage. He states that there has been an improvement in swelling and drainage over the past week. He is going to have arterial studies done tomorrow. He denies signs of infection. 5/31; patient presents for 1 week follow-up. He has been doing well with Kerlix/Coban wrap. He denies signs of infection. 6/7; patient presents for 1 week follow-up. He has been using Kerlix/Coban wraps bilaterally with collagen underneath. He has tolerated these well. He denies signs of infection. The right leg no longer has wounds. He has his [name] wraps today 6/14; the patient is effectively healed on the left leg. He already is wearing his [name] wraps on the right leg he has 20/30 stockings below this. He has a stocking for the left leg. He says he is going to go to elastic therapy for 20/30 stockings to put under the Farrow wraps 6/30; patient was discharged from the clinic 2 weeks ago for closed wounds. He reports using Farrow wraps bilaterally. He says he noticed in the past week he was developing new wounds. He denies signs of infection. 7/12; patient presents for follow-up. He has been tolerating the Kerlix/Coban well. When the wrap was taken off today he noticed a blister and this opened in the clinic. He denies signs of infection. Electronic Signature(s) Signed: 07/12/2020 3:55:00 PM By: Kalman Shan DO Entered By: Kalman Shan on 07/12/2020 15:29:07 -------------------------------------------------------------------------------- Physical Exam Details Patient Name: Date of Service: Harry Leventhal G. 07/12/2020 2:00 PM Medical Record Number: [id number] Patient Account Number: [id number] Date of Birth/Sex: Treating RN: 1945-11-21 (75 y.o. [name] Primary Care Provider: [name] Other Clinician: Referring  Provider: Treating Provider/Extender: [name] in Treatment:  8 Constitutional respirations regular, non-labored and within target range for patient.. Cardiovascular 2+ dorsalis pedis/posterior tibialis pulses. Psychiatric pleasant and cooperative. Notes Left lower extremity: He has a small open wound to the lower Medial aspect with granulation tissue present.2+ pitting edema to the knees. No signs of infection. The other previous wounds to his left lower extremity are healed Electronic Signature(s) Signed: 07/12/2020 3:55:00 PM By: Kalman Shan DO Entered By: Kalman Shan on 07/12/2020 15:32:03 -------------------------------------------------------------------------------- Physician Orders Details Patient Name: Date of Service: Harry Leventhal G. 07/12/2020 2:00 PM Medical Record Number: [id number] Patient Account Number: [id number] Date of Birth/Sex: Treating RN: 05/19/1945 (75 y.o. 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 - with Dr. Heber [place] Return A Bathing/ Shower/ Hygiene May shower with protection but do not get wound dressing(s) wet. - use cast protector Edema Control - Lymphedema / SCD / Other Bilateral Lower Extremities Elevate legs to the  level of the heart or above for 30 minutes daily and/or when sitting, a frequency of: - throughout the day Avoid standing for long periods of time. Exercise regularly Wound Treatment Wound #5 - Lower Leg Wound Laterality: Left, Anterior, Distal Cleanser: Soap and Water 1 x Per Week/7 Days Discharge Instructions: May shower and wash wound with dial antibacterial soap and water prior to dressing change. Cleanser: Wound Cleanser 1 x Per Week/7 Days Discharge Instructions: Cleanse the wound with wound cleanser prior to applying a clean dressing using gauze sponges, not tissue or cotton balls. Peri-Wound Care: Sween Lotion (Moisturizing lotion) 1 x Per Week/7 Days Discharge Instructions: Apply moisturizing lotion as directed Prim Dressing: Promogran Prisma Matrix, 4.34 (sq in) (silver collagen) 1 x Per Week/7 Days ary Discharge Instructions: Moisten collagen with saline or hydrogel Secondary Dressing: ABD Pad, 5x9 1 x Per Week/7 Days Discharge Instructions: Apply over primary dressing as directed. Compression Wrap: ThreePress (3 layer compression wrap) 1 x Per Week/7 Days Discharge Instructions: Apply three layer compression as directed. Wound #7 - Lower Leg Wound Laterality: Left, Medial, Distal Cleanser: Soap and Water 1 x Per Week/7 Days Discharge Instructions: May shower and wash wound with dial antibacterial soap and water prior to dressing change. Cleanser: Wound Cleanser 1 x Per Week/7 Days Discharge Instructions: Cleanse the wound with wound cleanser prior to applying a clean dressing using gauze sponges, not tissue or cotton balls. Peri-Wound Care: Sween Lotion (Moisturizing lotion) 1 x Per Week/7 Days Discharge Instructions: Apply moisturizing lotion as directed Prim Dressing: Promogran Prisma Matrix, 4.34 (sq in) (silver collagen) 1 x Per Week/7 Days ary Discharge Instructions: Moisten collagen with saline or hydrogel Secondary Dressing: ABD Pad, 5x9 1 x Per Week/7 Days Discharge  Instructions: Apply over primary dressing as directed. Compression Wrap: ThreePress (3 layer compression wrap) 1 x Per Week/7 Days Discharge Instructions: Apply three layer compression as directed. Electronic Signature(s) Signed: 07/12/2020 3:55:00 PM By: Kalman Shan DO Signed: 07/12/2020 5:43:47 PM By: Levan [name] RN, BSN Entered By: Levan [name] on 07/12/2020 15:02:33 -------------------------------------------------------------------------------- Problem List Details  Patient Name: Date of Service: Harry Foster 07/12/2020 2:00 PM Medical Record Number: [id number] Patient Account Number: [id number] Date of Birth/Sex: Treating RN: 02-Nov-1945 (75 y.o. [name] Primary Care Provider: [name] Other Clinician: Referring Provider: Treating Provider/Extender: [name] in Treatment: 8 Active Problems ICD-10 Encounter Code Description Active Date MDM Diagnosis L97.829 Non-pressure chronic ulcer of other part of left lower leg with unspecified 05/16/2020 No Yes severity I87.2 Venous insufficiency (chronic) (peripheral) 05/16/2020 No Yes I48.19 Other persistent atrial fibrillation 05/16/2020 No Yes Z79.01 Long term (current) use of anticoagulants 05/16/2020 No Yes I50.40 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 05/16/2020 No Yes failure M1A.9XX1 Chronic gout, unspecified, with tophus (tophi) 05/16/2020 No Yes N18.4 Chronic kidney disease, stage 4 (severe) 05/16/2020 No Yes Inactive Problems Resolved Problems ICD-10 Code Description Active Date Resolved Date L97.919 Non-pressure chronic ulcer of unspecified part of right lower leg with unspecified 05/16/2020 05/16/2020 severity Electronic Signature(s) Signed: 07/12/2020 3:55:00 PM By: Kalman Shan DO Entered By: Kalman Shan on 07/12/2020  15:18:12 -------------------------------------------------------------------------------- Progress Note Details Patient Name: Date of Service: Harry Leventhal G. 07/12/2020 2:00 PM Medical Record Number: [id number] Patient Account Number: [id number] Date of Birth/Sex: Treating RN: [birth year] (75 y.o. [name] Primary Care Provider: [name] Other Clinician: Referring Provider: Treating Provider/Extender: [name] in Treatment: 8 Subjective Chief Complaint Information obtained from Patient Bilateral lower extremity wounds History of Present Illness (HPI) Mr. Hilda Wexler III is a 75 year old male with a past medical history of persistent atrial fibrillation on Coumadin, hypertension, gout, CHF And venous insufficiency that presents to the clinic for bilateral lower extremity wounds. He states he has a history of blistering to his legs that eventually pop and heal. He states that in the past 6 weeks he has been unable to heal his wounds. He keeps them wrapped however they soak through quickly and has to change the wraps frequently. 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 He sleeps in a recliner at night and keeps his legs down due to increased pain when they are elevated. He states this symptom started a few weeks ago. He denies signs of infection. 5/23; patient presents for 1 week follow-up. He had Kerlix/Coban wrap placed at last clinic visit and changed again last week due to increased drainage. He states that there has been an improvement in swelling and drainage over the past week. He is going to have arterial studies done tomorrow. He denies signs of infection. 5/31; patient presents for 1 week follow-up. He has been doing well with Kerlix/Coban wrap. He denies signs of infection. 6/7; patient presents for 1 week follow-up. He has been using Kerlix/Coban wraps bilaterally with collagen  underneath. He has tolerated these well. He denies signs of infection. The right leg no longer has wounds. He has his [name] wraps today 6/14; the patient is effectively healed on the left leg. He already is wearing his [name] wraps on the right leg he has 20/30 stockings below this. He has a stocking for the left leg. He says he is going to go to elastic therapy for 20/30 stockings to put under the Farrow wraps 6/30; patient was discharged from the clinic 2 weeks ago for closed wounds. He reports using Farrow wraps bilaterally. He says he noticed in the past week he was developing new wounds. He denies signs of infection. 7/12; patient presents for follow-up.  He has been tolerating the Kerlix/Coban well. When the wrap was taken off today he noticed a blister and this opened in the clinic. He denies signs of infection. Patient History Information obtained from Patient. Family History Cancer - Mother,Siblings, Heart Disease - Father, No family history of Diabetes, Hereditary Spherocytosis, Hypertension, Kidney Disease, Lung Disease, Seizures, Stroke, Thyroid Problems, Tuberculosis. Social History Never smoker, Marital Status - Widowed, Alcohol Use - Never, Drug Use - No History, Caffeine Use - Daily - coffee. Medical History Eyes Patient has history of Cataracts - mild Denies history of Glaucoma, Optic Neuritis Ear/Nose/Mouth/Throat Denies history of Chronic sinus problems/congestion, Middle ear problems Cardiovascular Patient has history of Arrhythmia - afib, Congestive Heart Failure, Hypertension, Peripheral Venous Disease Endocrine Denies history of Type I Diabetes, Type II Diabetes Genitourinary Denies history of End Stage Renal Disease Integumentary (Skin) Denies history of History of Burn Musculoskeletal Patient has history of Gout, Osteoarthritis Denies history of Rheumatoid Arthritis, Osteomyelitis Oncologic Denies history of Received Chemotherapy, Received  Radiation Psychiatric Denies history of Anorexia/bulimia, Confinement Anxiety Hospitalization/Surgery History - bil knee replacements. Medical A Surgical History Notes nd Constitutional Symptoms (General Health) morbid obesity Genitourinary CKD stage 3 Objective Constitutional respirations regular, non-labored and within target range for patient.. Vitals Time Taken: 2:09 PM, Height: 71 in, Weight: 295 lbs, BMI: 41.1, Temperature: 98.2 F, Pulse: 77 bpm, Respiratory Rate: 17 breaths/min, Blood Pressure: 104/69 mmHg. Cardiovascular 2+ dorsalis pedis/posterior tibialis pulses. Psychiatric pleasant and cooperative. General Notes: Left lower extremity: He has a small open wound to the lower Medial aspect with granulation tissue present.2+ pitting edema to the knees. No signs of infection. The other previous wounds to his left lower extremity are healed Integumentary (Hair, Skin) Wound #5 status is Open. Original cause of wound was Blister. The date acquired was: 06/27/2020. The wound has been in treatment 1 weeks. The wound is located on the [facility] Lower Leg. The wound measures 0.1cm length x 0.1cm width x 0.1cm depth; 0.008cm^2 area and 0.001cm^3 volume. There is Fat Layer (Subcutaneous Tissue) exposed. There is no tunneling or undermining noted. There is a small amount of serosanguineous drainage noted. The wound margin is flat and intact. There is large (67-100%) pink granulation within the wound bed. There is no necrotic tissue within the wound bed. Wound #6 status is Healed - Epithelialized. Original cause of wound was Blister. The date acquired was: 06/27/2020. The wound has been in treatment 1 weeks. The wound is located on the Left,Medial Lower Leg. The wound measures 0cm length x 0cm width x 0cm depth; 0cm^2 area and 0cm^3 volume. There is no tunneling or undermining noted. There is a none present amount of drainage noted. The wound margin is flat and intact. There is no  granulation within the wound bed. There is no necrotic tissue within the wound bed. Wound #7 status is Open. Original cause of wound was Blister. The date acquired was: 07/12/2020. The wound is located on the Left,Distal,Medial Lower Leg. The wound measures 0.9cm length x 1.3cm width x 0.1cm depth; 0.919cm^2 area and 0.092cm^3 volume. There is Fat Layer (Subcutaneous Tissue) exposed. There is no tunneling or undermining noted. There is a medium amount of serosanguineous drainage noted. The wound margin is flat and intact. There is large (67-100%) pink granulation within the wound bed. There is no necrotic tissue within the wound bed. 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 He has developed a blister to the lower medial aspect that opened while taking the wrap off today. I think at this time we could increase the wrap to a 3 layer And continue collagen. He has strong pulses. I told him to take the wrap off if it causes any discomfort. He denies signs of infection. I will see him back in 1 week. Procedures Wound #5 Pre-procedure diagnosis of Wound #5 is a Venous Leg Ulcer located on the Left,Distal,Anterior Lower Leg . There was a Three Layer Compression Therapy Procedure by Levan [name], RN. Post procedure Diagnosis Wound #5: Same as Pre-Procedure Wound #7 Pre-procedure diagnosis of Wound #7 is a Venous Leg Ulcer located on the Left,Distal,Medial Lower Leg . There was a Three Layer Compression Therapy Procedure by Levan [name], RN. Post procedure Diagnosis Wound #7: Same as  Pre-Procedure Plan Follow-up Appointments: Return Appointment in 1 week. - with Dr. Heber [place] Bathing/ Shower/ Hygiene: May shower with protection but do not get wound dressing(s) wet. - use cast protector Edema Control - Lymphedema / SCD / Other: Elevate legs to the level of the heart or above for 30 minutes daily and/or when sitting, a frequency of: - throughout the day Avoid standing for long periods of time. Exercise regularly WOUND #5: - Lower Leg Wound Laterality: Left, Anterior, Distal Cleanser: Soap and Water 1 x Per Week/7 Days Discharge Instructions: May shower and wash wound with dial antibacterial soap and water prior to dressing change. Cleanser: Wound Cleanser 1 x Per Week/7 Days Discharge Instructions: Cleanse the wound with wound cleanser prior to applying a clean dressing using gauze sponges, not tissue or cotton balls. Peri-Wound Care: Sween Lotion (Moisturizing lotion) 1 x Per Week/7 Days Discharge Instructions: Apply moisturizing lotion as directed Prim Dressing: Promogran Prisma Matrix, 4.34 (sq in) (silver collagen) 1 x Per Week/7 Days ary Discharge Instructions: Moisten collagen with saline or hydrogel Secondary Dressing: ABD Pad, 5x9 1 x Per Week/7 Days Discharge Instructions: Apply over primary dressing as directed. Com pression Wrap: ThreePress (3 layer compression wrap) 1 x Per Week/7 Days Discharge Instructions: Apply three layer compression as directed. WOUND #7: - Lower Leg Wound Laterality: Left, Medial, Distal Cleanser: Soap and Water 1 x Per Week/7 Days Discharge Instructions: May shower and wash wound with dial antibacterial soap and water prior to dressing change. Cleanser: Wound Cleanser 1 x Per Week/7 Days Discharge Instructions: Cleanse the wound with wound cleanser prior to applying a clean dressing using gauze sponges, not tissue or cotton balls. Peri-Wound Care: Sween Lotion (Moisturizing lotion) 1 x Per Week/7 Days Discharge Instructions: Apply  moisturizing lotion as directed Prim Dressing: Promogran Prisma Matrix, 4.34 (sq in) (silver collagen) 1 x Per Week/7 Days ary Discharge Instructions: Moisten collagen with saline or hydrogel Secondary Dressing: ABD Pad, 5x9 1 x Per Week/7 Days Discharge Instructions: Apply over primary dressing as directed. Com pression Wrap: ThreePress (3 layer compression wrap) 1 x Per Week/7 Days Discharge Instructions: Apply three layer compression as directed. 1. Collagen under 3 layer compression 2. Follow-up in 1 week 3. I asked him to bring his Financial controller) Signed: 07/12/2020 3:55:00 PM By: Kalman Shan DO Entered By: Kalman Shan on 07/12/2020 15:54:06 -------------------------------------------------------------------------------- HxROS Details Patient Name: Date of Service: Harry Leventhal G. 07/12/2020 2:00 PM Medical Record Number: [id number] Patient  Account Number: [id number] Date of Birth/Sex: Treating RN: 1945/03/28 (75 y.o. [name] Primary Care Provider: [name] Other Clinician: Referring Provider: Treating Provider/Extender: [name] in Treatment: 8 Information Obtained From Patient Constitutional Symptoms (General Health) Medical History: Past Medical History Notes: morbid obesity Eyes Medical History: Positive for: Cataracts - mild Negative for: Glaucoma; Optic Neuritis Ear/Nose/Mouth/Throat Medical History: Negative for: Chronic sinus problems/congestion; Middle ear problems Cardiovascular Medical History: Positive for: Arrhythmia - afib; Congestive Heart Failure; Hypertension; Peripheral Venous Disease Endocrine Medical History: Negative for: Type I Diabetes; Type II Diabetes Genitourinary Medical History: Negative for: End Stage Renal Disease Past Medical History Notes: CKD stage 3 Integumentary (Skin) Medical History: Negative for: History of  Burn Musculoskeletal Medical History: Positive for: Gout; Osteoarthritis Negative for: Rheumatoid Arthritis; Osteomyelitis Oncologic Medical History: Negative for: Received Chemotherapy; Received Radiation Psychiatric Medical History: Negative for: Anorexia/bulimia; Confinement Anxiety HBO Extended History Items Eyes: Cataracts Immunizations Pneumococcal Vaccine: Received Pneumococcal Vaccination: Yes Implantable Devices No devices added Hospitalization / Surgery History Type of Hospitalization/Surgery bil knee replacements Family and Social History Cancer: Yes - Mother,Siblings; Diabetes: No; Heart Disease: Yes - Father; Hereditary Spherocytosis: No; Hypertension: No; Kidney Disease: No; Lung Disease: No; Seizures: No; Stroke: No; Thyroid Problems: No; Tuberculosis: No; Never smoker; Marital Status - Widowed; Alcohol Use: Never; Drug Use: No History; Caffeine Use: Daily - coffee; Financial Concerns: No; Food, Clothing or Shelter Needs: No; Support System Lacking: No; Transportation Concerns: No Electronic Signature(s) Signed: 07/12/2020 3:55:00 PM By: Kalman Shan DO Signed: 07/12/2020 5:43:47 PM By: Levan [name] RN, BSN Entered By: Kalman Shan on 07/12/2020 15:29:13 -------------------------------------------------------------------------------- [place] Details Patient Name: Date of Service: Harry Foster 07/12/2020 Medical Record Number: [id number] Patient Account Number: [id number] Date of Birth/Sex: Treating RN: Aug 05, 1945 (75 y.o. 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 failure M1A.9XX1 Chronic gout, unspecified, 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, stage 4 (severe) Facility Procedures CPT4 Code: [id number] Description: (Facility Use Only) [phone] - [facility] EXHBZJ LWR LT LEG Modifier: Quantity: 1 Physician Procedures : CPT4 Code Description Modifier [id number] - WC PHYS LEVEL 3 - EST PT 1 ICD-10 Diagnosis Description L97.829 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ity I87.2 Venous insufficiency (chronic) (peripheral) Quantity: Electronic Signature(s) Signed: 07/12/2020 3:55:00 PM By: Kalman Shan DO Entered By: Kalman Shan on 07/12/2020 15:54:26

## 2020-07-14 ENCOUNTER — Ambulatory Visit (INDEPENDENT_AMBULATORY_CARE_PROVIDER_SITE_OTHER): Payer: Medicare Other | Admitting: *Deleted

## 2020-07-14 ENCOUNTER — Other Ambulatory Visit: Payer: Self-pay

## 2020-07-14 DIAGNOSIS — Z5181 Encounter for therapeutic drug level monitoring: Secondary | ICD-10-CM | POA: Diagnosis not present

## 2020-07-14 DIAGNOSIS — I4819 Other persistent atrial fibrillation: Secondary | ICD-10-CM

## 2020-07-14 LAB — POCT INR: INR: 1.9 — AB (ref 2.0–3.0)

## 2020-07-14 NOTE — Patient Instructions (Signed)
Description   Today take 1.5 tablets then continue taking Warfarin 1 tablet daily except for 2 tablets on Fridays. Recheck INR in 4 weeks. Call Coumadin Clinic for any questions/updates at [phone].

## 2020-07-18 NOTE — Progress Notes (Signed)
AIMEE, TIMMONS ([id number]) Visit Report for 07/12/2020 Arrival Information Details Patient Name: Date of Service: Harry Foster 07/12/2020 2:00 PM Medical Record Number: [id number] Patient Account Number: [id number] Date of Birth/Sex: Treating RN: 18-Apr-1945 (75 y.o. [name] Primary Care Arabell Neria: [name] Other Clinician: Referring Lavoris Canizales: Treating Freada Twersky/Extender: [name] in Treatment: 8 Visit Information History Since Last Visit Added or deleted any medications: No Patient Arrived: Ambulatory Any new allergies or adverse reactions: No Arrival Time: 14:03 Had a fall or experienced change in No Accompanied By: self activities of daily living that may affect Transfer Assistance: None risk of falls: Patient Identification Verified: Yes Signs or symptoms of abuse/neglect since last visito No Secondary Verification Process Completed: Yes Hospitalized since last visit: No Patient Requires Transmission-Based Precautions: No Implantable device outside of the clinic excluding No Patient Has Alerts: Yes cellular tissue based products placed in the center Patient Alerts: R ABI: 1.63 TBI: 1.36 since last visit: L ABI: 1.49 TBI: 1.34 Has Dressing in Place as Prescribed: Yes 05/2020 Pain Present Now: No Electronic Signature(s) Signed: 07/12/2020 2:49:47 PM By: [name] Entered By: [name] on 07/12/2020 14:08:05 -------------------------------------------------------------------------------- Compression Therapy Details Patient Name: Date of Service: Harry Foster. 07/12/2020 2:00 PM Medical Record Number: [id number] Patient Account Number: [id number] Date of Birth/Sex: Treating RN: [birth year] (75 y.o. [name] Primary Care Starsha Morning: [name] Other Clinician: Referring Rakeya Glab: Treating Daquawn Seelman/Extender: [name] in Treatment: 8 Compression  Therapy Performed for Wound Assessment: Wound #5 Left,Distal,Anterior Lower Leg Performed By: Clinician Levan [name], RN Compression Type: Three Layer Post Procedure Diagnosis Same as Pre-procedure Electronic Signature(s) Signed: 07/12/2020 5:43:47 PM By: Levan [name] RN, BSN Entered By: Levan [name] on 07/12/2020 15:03:10 -------------------------------------------------------------------------------- Compression Therapy Details Patient Name: Date of Service: Harry Foster 07/12/2020 2:00 PM Medical Record Number: [id number] Patient Account Number: [id number] Date of Birth/Sex: Treating RN: [birth year] (75 y.o. [name] Primary Care Cheryll Keisler: [name] Other Clinician: Referring Lilac Hoff: Treating Tyriana Helmkamp/Extender: [name] in Treatment: 8 Compression Therapy Performed for Wound Assessment: Wound #7 Left,Distal,Medial Lower Leg Performed By: Clinician Levan [name], RN Compression Type: Three Layer Post Procedure Diagnosis Same as Pre-procedure Electronic Signature(s) Signed: 07/12/2020 5:43:47 PM By: Levan [name] RN, BSN Entered By: Levan [name] on 07/12/2020 15:03:10 -------------------------------------------------------------------------------- Encounter Discharge Information Details Patient Name: Date of Service: Harry Foster. 07/12/2020 2:00 PM Medical Record Number: [id number] Patient Account Number: [id number] Date of Birth/Sex: Treating RN: [birth year] (75 y.o. [name] Primary Care Britzy Graul: [name] Other Clinician: Referring Jaydee Conran: Treating Allyiah Gartner/Extender: [name] in Treatment: 8 Encounter Discharge Information Items Discharge Condition: Stable Ambulatory Status: Ambulatory Discharge Destination: Home Transportation: Private Auto Accompanied By: alone Schedule Follow-up Appointment: Yes Clinical Summary of Care: Patient  Declined Electronic Signature(s) Signed: 07/12/2020 5:43:47 PM By: Levan [name] RN, BSN Entered By: Levan [name] on 07/12/2020 17:31:24 -------------------------------------------------------------------------------- Lower Extremity Assessment Details Patient Name: Date of Service: Harry Foster. 07/12/2020 2:00 PM Medical Record Number: [id number] Patient Account Number: [id number] Date of Birth/Sex: Treating RN: September 30, 1945 (75 y.o. [name] Primary Care Jesus Nevills: [name] Other Clinician: Referring Armenta Erskin: Treating Pluma Diniz/Extender: [name] in Treatment: 8 Edema Assessment Assessed: [Left: No] [Right: No] Edema: [Left: Ye] [Right: s] Calf Left: Right: Point of Measurement: From Medial Instep 45.5 cm Ankle Left: Right: Point  of Measurement: From Medial Instep 27.5 cm Vascular Assessment Pulses: Dorsalis Pedis Palpable: [Left:Yes] Electronic Signature(s) Signed: 07/12/2020 5:43:47 PM By: Levan [name] RN, BSN Entered By: Levan [name] on 07/12/2020 14:57:59 -------------------------------------------------------------------------------- Multi Wound Chart Details Patient Name: Date of Service: Harry Foster. 07/12/2020 2:00 PM Medical Record Number: [id number] Patient Account Number: [id number] Date of Birth/Sex: Treating RN: March 03, 1945 (75 y.o. [name] Primary Care Rolena Knutson: [name] Other Clinician: Referring Keshia Weare: Treating Yalanda Soderman/Extender: [name] in Treatment: 8 Vital Signs Height(in): 71 Pulse(bpm): 77 Weight(lbs): 497 Blood Pressure(mmHg): 104/69 Body Mass Index(BMI): 41 Temperature(F): 98.2 Respiratory Rate(breaths/min): 17 Photos: [5:No Photos Left, Distal, Anterior Lower Leg] [6:No Photos Left, Medial Lower Leg] [7:No Photos Left, Distal, Medial Lower Leg] Wound Location: [5:Blister] [6:Blister] [7:Blister] Wounding  Event: [5:Venous Leg Ulcer] [6:Venous Leg Ulcer] [7:Venous Leg Ulcer] Primary Etiology: [5:Cataracts, Arrhythmia, Congestive Cataracts, Arrhythmia, Congestive Cataracts, Arrhythmia, Congestive] Comorbid History: [5:Heart Failure, Hypertension, Peripheral Heart Failure, Hypertension, Peripheral Heart Failure, Hypertension, Peripheral Venous Disease, Gout, Osteoarthritis Venous Disease, Gout, Osteoarthritis Venous Disease, Gout, Osteoarthritis  06/27/2020] [6:06/27/2020] [7:07/12/2020] Date Acquired: [5:1] [6:1] [7:0] Weeks of Treatment: [5:Open] [6:Healed - Epithelialized] [7:Open] Wound Status: [5:0.1x0.1x0.1] [6:0x0x0] [7:0.9x1.3x0.1] Measurements L x W x D (cm) [5:0.008] [6:0] [7:0.919] A (cm) : rea [5:0.001] [6:0] [7:0.092] Volume (cm) : [5:99.80%] [6:100.00%] [7:0.00%] % Reduction in Area: [5:99.80%] [6:100.00%] [7:0.00%] % Reduction in Volume: [5:Full Thickness Without Exposed] [6:Full Thickness Without Exposed] [7:Full Thickness Without Exposed] Classification: [5:Support Structures Small] [6:Support Structures None Present] [7:Support Structures Medium] Exudate Amount: [5:Serosanguineous] [6:N/A] [7:Serosanguineous] Exudate Type: [5:red, brown] [6:N/A] [7:red, brown] Exudate Color: [5:Flat and Intact] [6:Flat and Intact] [7:Flat and Intact] Wound Margin: [5:Large (67-100%)] [6:None Present (0%)] [7:Large (67-100%)] Granulation Amount: [5:Pink] [6:N/A] [7:Pink] Granulation Quality: [5:None Present (0%)] [6:None Present (0%)] [7:None Present (0%)] Necrotic Amount: [5:Fat Layer (Subcutaneous Tissue): Yes Fascia: No] [7:Fat Layer (Subcutaneous Tissue): Yes] Exposed Structures: [5:Fascia: No Tendon: No Muscle: No Joint: No Bone: No Large (67-100%)] [6:Fat Layer (Subcutaneous Tissue): No Fascia: No Tendon: No Muscle: No Joint: No Bone: No Large (67-100%)] [7:Tendon: No Muscle: No Joint: No Bone: No None] Epithelialization: [5:Compression Therapy] [6:N/A] [7:Compression Therapy] Procedures  Performed: Treatment Notes Electronic Signature(s) Signed: 07/12/2020 3:55:00 PM By: Kalman Shan DO Signed: 07/12/2020 5:43:47 PM By: Levan [name] RN, BSN Entered By: Kalman Shan on 07/12/2020 15:18:18 -------------------------------------------------------------------------------- Multi-Disciplinary Care Plan Details Patient Name: Date of Service: [name] 07/12/2020 2:00 PM Medical Record Number: [id number] Patient Account Number: [id number] Date of Birth/Sex: Treating RN: 10-18-1945 (75 y.o. 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 Compression as ordered Provide education on venous insufficiency Notes: Wound/Skin Impairment Nursing Diagnoses: Impaired tissue integrity Knowledge deficit related to ulceration/compromised skin integrity Goals: Patient/caregiver will verbalize understanding of skin care regimen Date Initiated: 05/16/2020 Target Resolution Date: 08/05/2020 Goal Status: Active Ulcer/skin breakdown will have a volume reduction of 30% by week 4 Date Initiated: 05/16/2020 Date Inactivated: 06/14/2020 Target Resolution Date: 06/17/2020 Goal Status:  Met Interventions: Assess patient/caregiver ability to obtain necessary  supplies Assess patient/caregiver ability to perform ulcer/skin care regimen upon admission and as needed Assess ulceration(s) every visit Provide education on ulcer and skin care Notes: Electronic Signature(s) Signed: 07/12/2020 5:43:47 PM By: Levan [name] RN, BSN Signed: 07/12/2020 5:43:47 PM By: Levan [name] RN, BSN Entered By: Levan [name] on 07/12/2020 17:26:02 -------------------------------------------------------------------------------- Pain Assessment Details Patient Name: Date of Service: [name] 07/12/2020 2:00 PM Medical Record Number: [id number] Patient Account Number: [id number] Date of Birth/Sex: Treating RN: [birth year] (75 y.o. [name] Primary Care Shira Bobst: [name] Other Clinician: Referring Joleena Weisenburger: Treating Janson Lamar/Extender: [name] in Treatment: 8 Active Problems Location of Pain Severity and Description of Pain Patient Has Paino No Site Locations Pain Management and Medication Current Pain Management: Electronic Signature(s) Signed: 07/12/2020 2:49:47 PM By: [name] Signed: 07/12/2020 5:43:47 PM By: Levan [name] RN, BSN Entered By: [name] on 07/12/2020 14:09:49 -------------------------------------------------------------------------------- Patient/Caregiver Education Details Patient Name: Date of Service: Harry Foster 7/12/2022andnbsp2:00 PM Medical Record Number: [id number] Patient Account Number: [id number] Date of Birth/Gender: Treating RN: 17-Sep-1945 (75 y.o. [name] Primary Care Physician: [name] Other Clinician: Referring Physician: Treating Physician/Extender: [name] in Treatment: 8 Education Assessment Education Provided To: Patient Education Topics Provided Wound/Skin Impairment: Methods: Explain/Verbal Responses: State content correctly Electronic Signature(s) Signed:  07/12/2020 5:43:47 PM By: Levan [name] RN, BSN Entered By: Levan [name] on 07/12/2020 17:26:12 -------------------------------------------------------------------------------- Wound Assessment Details Patient Name: Date of Service: Harry Foster 07/12/2020 2:00 PM Medical Record Number: [id number] Patient Account Number: [id number] Date of Birth/Sex: Treating RN: 1945/10/18 (75 y.o. [name] Primary Care Isobelle Tuckett: [name] Other Clinician: Referring Larcenia Holaday: Treating Shantaya Bluestone/Extender: [name] in Treatment: 8 Wound Status Wound Number: 5 Primary Venous Leg Ulcer Etiology: Wound Location: Left, Distal, Anterior Lower Leg Wound Open Wounding Event: Blister Status: Date Acquired: 06/27/2020 Comorbid Cataracts, Arrhythmia, Congestive Heart Failure, Hypertension, Weeks Of Treatment: 1 History: Peripheral Venous Disease, Gout, Osteoarthritis Clustered Wound: No Photos Wound Measurements Length: (cm) 0.1 Width: (cm) 0.1 Depth: (cm) 0.1 Area: (cm) 0.008 Volume: (cm) 0.001 % Reduction in Area: 99.8% % Reduction in Volume: 99.8% Epithelialization: Large (67-100%) Tunneling: No Undermining: No Wound Description Classification: Full Thickness Without Exposed Support Structures Wound Margin: Flat and Intact Exudate Amount: Small Exudate Type: Serosanguineous Exudate Color: red, brown Foul Odor After Cleansing: No Slough/Fibrino No Wound Bed Granulation Amount: Large (67-100%) Exposed Structure Granulation Quality: Pink Fascia Exposed: No Necrotic Amount: None Present (0%) Fat Layer (Subcutaneous Tissue) Exposed: Yes Tendon Exposed: No Muscle Exposed: No Joint Exposed: No Bone Exposed: No Treatment Notes Wound #5 (Lower Leg) Wound Laterality: Left, Anterior, Distal Cleanser Soap and Water Discharge Instruction: May shower and wash wound with dial antibacterial soap and water prior to dressing  change. Wound Cleanser Discharge Instruction: Cleanse the wound with wound cleanser prior to applying a clean dressing using gauze sponges, not tissue or cotton balls. Peri-Wound Care Sween Lotion (Moisturizing lotion) Discharge Instruction: Apply moisturizing lotion as directed Topical Primary Dressing Promogran Prisma Matrix, 4.34 (sq in) (silver collagen) Discharge Instruction: Moisten collagen with saline or hydrogel Secondary Dressing ABD Pad, 5x9 Discharge Instruction: Apply over primary dressing as directed. Secured With Compression Wrap ThreePress (3 layer compression wrap) Discharge Instruction: Apply three layer compression as directed. Compression Stockings Add-Ons Electronic Signature(s) Signed: 07/12/2020 5:43:47 PM By: Levan [name] RN, BSN Signed: 07/18/2020  3:49:49 PM By: [name] Previous Signature: 07/12/2020 2:49:47 PM Version By: [name] Entered By: [name] on 07/12/2020 16:29:59 -------------------------------------------------------------------------------- Wound Assessment Details Patient Name: Date of Service: [name] 07/12/2020 2:00 PM Medical Record Number: [id number] Patient Account Number: [id number] Date of Birth/Sex: Treating RN: 1945/04/24 (75 y.o. [name] Primary Care Kaedan Richert: [name] Other Clinician: Referring Ichael Pullara: Treating Muhanad Torosyan/Extender: [name] in Treatment: 8 Wound Status Wound Number: 6 Primary Venous Leg Ulcer Etiology: Wound Location: Left, Medial Lower Leg Wound Healed - Epithelialized Wounding Event: Blister Status: Date Acquired: 06/27/2020 Comorbid Cataracts, Arrhythmia, Congestive Heart Failure, Hypertension, Weeks Of Treatment: 1 History: Peripheral Venous Disease, Gout, Osteoarthritis Clustered Wound: No Wound Measurements Length: (cm) Width: (cm) Depth: (cm) Area: (cm) Volume: (cm) 0 % Reduction in Area: 100% 0 %  Reduction in Volume: 100% 0 Epithelialization: Large (67-100%) 0 Tunneling: No 0 Undermining: No Wound Description Classification: Full Thickness Without Exposed Support Structures Wound Margin: Flat and Intact Exudate Amount: None Present Foul Odor After Cleansing: No Slough/Fibrino No Wound Bed Granulation Amount: None Present (0%) Exposed Structure Necrotic Amount: None Present (0%) Fascia Exposed: No Fat Layer (Subcutaneous Tissue) Exposed: No Tendon Exposed: No Muscle Exposed: No Joint Exposed: No Bone Exposed: No Electronic Signature(s) Signed: 07/12/2020 5:43:47 PM By: Levan [name] RN, BSN Previous Signature: 07/12/2020 2:49:47 PM Version By: [name] Entered By: Levan [name] on 07/12/2020 15:00:31 -------------------------------------------------------------------------------- Wound Assessment Details Patient Name: Date of Service: Harry Foster. 07/12/2020 2:00 PM Medical Record Number: [id number] Patient Account Number: [id number] Date of Birth/Sex: Treating RN: 10/18/1945 (75 y.o. [name] Primary Care Reeder Brisby: [name] Other Clinician: Referring Dailah Opperman: Treating Haruka Kowaleski/Extender: [name] in Treatment: 8 Wound Status Wound Number: 7 Primary Venous Leg Ulcer Etiology: Wound Location: Left, Distal, Medial Lower Leg Wound Open Wounding Event: Blister Status: Date Acquired: 07/12/2020 Comorbid Cataracts, Arrhythmia, Congestive Heart Failure, Hypertension, Weeks Of Treatment: 0 History: Peripheral Venous Disease, Gout, Osteoarthritis Clustered Wound: No Wound Measurements Length: (cm) 0.9 Width: (cm) 1.3 Depth: (cm) 0.1 Area: (cm) 0.919 Volume: (cm) 0.092 % Reduction in Area: 0% % Reduction in Volume: 0% Epithelialization: None Tunneling: No Undermining: No Wound Description Classification: Full Thickness Without Exposed Support Structures Wound Margin: Flat and  Intact Exudate Amount: Medium Exudate Type: Serosanguineous Exudate Color: red, brown Foul Odor After Cleansing: No Slough/Fibrino No Wound Bed Granulation Amount: Large (67-100%) Exposed Structure Granulation Quality: Pink Fascia Exposed: No Necrotic Amount: None Present (0%) Fat Layer (Subcutaneous Tissue) Exposed: Yes Tendon Exposed: No Muscle Exposed: No Joint Exposed: No Bone Exposed: No Treatment Notes Wound #7 (Lower Leg) Wound Laterality: Left, Medial, Distal Cleanser Soap and Water Discharge Instruction: May shower and wash wound with dial antibacterial soap and water prior to dressing change. Wound Cleanser Discharge Instruction: Cleanse the wound with wound cleanser prior to applying a clean dressing using gauze sponges, not tissue or cotton balls. Peri-Wound Care Sween Lotion (Moisturizing lotion) Discharge Instruction: Apply moisturizing lotion as directed Topical Primary Dressing Promogran Prisma Matrix, 4.34 (sq in) (silver collagen) Discharge Instruction: Moisten collagen with saline or hydrogel Secondary Dressing ABD Pad, 5x9 Discharge Instruction: Apply over primary dressing as directed. Secured With Compression Wrap ThreePress (3 layer compression wrap) Discharge Instruction: Apply three layer compression as directed. Compression Stockings Add-Ons Electronic Signature(s) Signed: 07/12/2020 5:43:47 PM By: Levan [name] RN, BSN Previous Signature: 07/12/2020 2:49:47 PM Version By: [name] Entered By: Levan [name] on 07/12/2020 15:01:07 --------------------------------------------------------------------------------  Vitals Details Patient Name: Date of Service: Harry Foster 07/12/2020 2:00 PM Medical Record Number: [id number] Patient Account Number: [id number] Date of Birth/Sex: Treating RN: 12/05/1945 (75 y.o. [name] Primary Care Tauno Falotico: [name] Other Clinician: Referring Harve Spradley: Treating  Peighton Edgin/Extender: [name] in Treatment: 8 Vital Signs Time Taken: 14:09 Temperature (F): 98.2 Height (in): 71 Pulse (bpm): 77 Weight (lbs): 295 Respiratory Rate (breaths/min): 17 Body Mass Index (BMI): 41.1 Blood Pressure (mmHg): 104/69 Reference Range: 80 - 120 mg / dl Electronic Signature(s) Signed: 07/12/2020 2:49:47 PM By: [name] Entered By: [name] on 07/12/2020 14:09:37

## 2020-07-20 DIAGNOSIS — M109 Gout, unspecified: Secondary | ICD-10-CM | POA: Diagnosis not present

## 2020-07-20 DIAGNOSIS — M7989 Other specified soft tissue disorders: Secondary | ICD-10-CM | POA: Diagnosis not present

## 2020-07-20 DIAGNOSIS — Z79899 Other long term (current) drug therapy: Secondary | ICD-10-CM | POA: Diagnosis not present

## 2020-07-20 DIAGNOSIS — I878 Other specified disorders of veins: Secondary | ICD-10-CM | POA: Diagnosis not present

## 2020-07-20 DIAGNOSIS — N1831 Chronic kidney disease, stage 3a: Secondary | ICD-10-CM | POA: Diagnosis not present

## 2020-07-20 DIAGNOSIS — M1A9XX1 Chronic gout, unspecified, with tophus (tophi): Secondary | ICD-10-CM | POA: Diagnosis not present

## 2020-07-20 DIAGNOSIS — I509 Heart failure, unspecified: Secondary | ICD-10-CM | POA: Diagnosis not present

## 2020-07-20 DIAGNOSIS — R600 Localized edema: Secondary | ICD-10-CM | POA: Diagnosis not present

## 2020-07-21 ENCOUNTER — Encounter (HOSPITAL_BASED_OUTPATIENT_CLINIC_OR_DEPARTMENT_OTHER): Payer: Medicare Other | Admitting: Internal Medicine

## 2020-07-21 ENCOUNTER — Other Ambulatory Visit: Payer: Self-pay

## 2020-07-21 DIAGNOSIS — N184 Chronic kidney disease, stage 4 (severe): Secondary | ICD-10-CM | POA: Diagnosis not present

## 2020-07-21 DIAGNOSIS — I872 Venous insufficiency (chronic) (peripheral): Secondary | ICD-10-CM | POA: Diagnosis not present

## 2020-07-21 DIAGNOSIS — L97929 Non-pressure chronic ulcer of unspecified part of left lower leg with unspecified severity: Secondary | ICD-10-CM | POA: Diagnosis not present

## 2020-07-21 DIAGNOSIS — I890 Lymphedema, not elsewhere classified: Secondary | ICD-10-CM | POA: Diagnosis not present

## 2020-07-21 DIAGNOSIS — I130 Hypertensive heart and chronic kidney disease with heart failure and stage 1 through stage 4 chronic kidney disease, or unspecified chronic kidney disease: Secondary | ICD-10-CM | POA: Diagnosis not present

## 2020-07-21 DIAGNOSIS — Z7901 Long term (current) use of anticoagulants: Secondary | ICD-10-CM | POA: Diagnosis not present

## 2020-07-21 DIAGNOSIS — I504 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 failure: Secondary | ICD-10-CM | POA: Diagnosis not present

## 2020-07-21 DIAGNOSIS — L97829 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ity: Secondary | ICD-10-CM | POA: Diagnosis not present

## 2020-07-21 DIAGNOSIS — M1A9XX1 Chronic gout, unspecified, with tophus (tophi): Secondary | ICD-10-CM | POA: Diagnosis not present

## 2020-07-21 DIAGNOSIS — I4819 Other persistent atrial fibrillation: Secondary | ICD-10-CM | POA: Diagnosis not present

## 2020-07-21 DIAGNOSIS — L97919 Non-pressure chronic ulcer of unspecified part of right lower leg with unspecified severity: Secondary | ICD-10-CM | POA: Diagnosis not present

## 2020-07-21 NOTE — Progress Notes (Signed)
HARBERT, FITTERER ([id number]) Visit Report for 07/21/2020 HPI Details Patient Name: Date of Service: Harry Foster 07/21/2020 1:45 PM Medical Record Number: [id number] Patient Account Number: [id number] Date of Birth/Sex: Treating RN: 1945/11/07 (75 y.o. Harry Foster, Harry Foster Primary Care Provider: [name] Other Clinician: Referring Provider: Treating Provider/Extender: Rowland Lathe Weeks in Treatment: 9 History of Present Illness HPI Description: Mr. Harry Foster is a 75 year old male with a past medical history of persistent atrial fibrillation on Coumadin, hypertension, gout, CHF And venous insufficiency that presents to the clinic for bilateral lower extremity wounds. He states he has a history of blistering to his legs that eventually Foster and heal. He states that in the past 6 weeks he has been unable to heal his wounds. He keeps them wrapped however they soak through quickly and has to change the wraps frequently. 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 He sleeps in a recliner at night and keeps his legs down due to increased pain when they are elevated. He states this symptom started a few weeks ago. He denies signs of infection. 5/23; patient presents for 1 week follow-up. He had Kerlix/Coban wrap placed at last clinic visit and changed again last week due to increased drainage. He states that there has been an improvement in swelling and drainage over the past week. He is going to have arterial studies done tomorrow. He denies signs of infection. 5/31; patient presents for 1 week follow-up. He has been doing well with Kerlix/Coban wrap. He denies signs of infection. 6/7; patient presents for 1 week follow-up. He has been using Kerlix/Coban wraps bilaterally with collagen underneath. He has tolerated these well. He denies signs of infection. The right leg no longer has wounds. He has his [name] wraps  today 6/14; the patient is effectively healed on the left leg. He already is wearing his [name] wraps on the right leg he has 20/30 stockings below this. He has a stocking for the left leg. He says he is going to go to elastic therapy for 20/30 stockings to put under the Farrow wraps 6/30; patient was discharged from the clinic 2 weeks ago for closed wounds. He reports using Farrow wraps bilaterally. He says he noticed in the past week he was developing new wounds. He denies signs of infection. 7/12; patient presents for follow-up. He has been tolerating the Kerlix/Coban well. When the wrap was taken off today he noticed a blister and this opened in the clinic. He denies signs of infection. 7/21. The patient arrives healed. Previously followed by Dr. Heber [place]. He has severe chronic venous insufficiency. The blister he noted last week on the left anterior leg is closed. He has Farrow wrap stockings he is familiar with these. I cannot really determine from him today whether he was actually using these when these wounds formed. Electronic Signature(s) Signed: 07/21/2020 5:24:41 PM By: Linton Ham MD Entered By: Linton Ham on 07/21/2020 14:36:26 -------------------------------------------------------------------------------- Physical Exam Details Patient Name: Date of Service: Harry Foster 07/21/2020 1:45 PM Medical Record Number: [id number] Patient Account Number: [id number] Date of Birth/Sex: Treating RN: 1945/02/03 (75 y.o. Erie Noe Primary Care Provider: [name] Other Clinician: Referring Provider: Treating Provider/Extender: Rowland Lathe Weeks in Treatment: 9 Constitutional Sitting or standing Blood Pressure is within target range for patient.. Pulse regular and within target range for patient.Marland Kitchen Respirations regular, non-labored and within target range.. Temperature is  normal and within the target range for the patient.Marland Kitchen Appears  in no distress. Notes Wound exam; left lower extremity he has a healed area on the left anterior lower extremity left medial. He still has mild edema but much better than described last week. Very significant changes of chronic venous insufficiency in his legs but no open wounds. Electronic Signature(s) Signed: 07/21/2020 5:24:41 PM By: Linton Ham MD Entered By: Linton Ham on 07/21/2020 14:37:10 -------------------------------------------------------------------------------- Physician Orders Details Patient Name: Date of Service: Harry Foster 07/21/2020 1:45 PM Medical Record Number: [id number] Patient Account Number: [id number] Date of Birth/Sex: Treating RN: June 15, 1945 (75 y.o. Harry Foster Primary Care Provider: [name] Other Clinician: Referring Provider: Treating Provider/Extender: Rowland Lathe Weeks in Treatment: 9 Verbal / Phone Orders: No Diagnosis Coding Discharge From [facility] Services Discharge from [place] - Call if any future wound care needs. Edema Control - Lymphedema / SCD / Other Elevate legs to the level of the heart or above for 30 minutes daily and/or when sitting, a frequency of: - 3-4 times a day throughout the day. Avoid standing for long periods of time. Exercise regularly Moisturize legs daily. - every night before bed. Compression stocking or Garment 30-40 mm/Hg pressure to: - Farrow wrap apply in the morning and remove at night. Electronic Signature(s) Signed: 07/21/2020 5:24:41 PM By: Linton Ham MD Signed: 07/21/2020 6:31:47 PM By: Deon Pilling Entered By: Deon Pilling on 07/21/2020 14:19:07 -------------------------------------------------------------------------------- Problem List Details Patient Name: Date of Service: Harry Foster 07/21/2020 1:45 PM Medical Record Number: [id number] Patient Account Number: [id number] Date of Birth/Sex: Treating RN: 24-Apr-1945 (75 y.o. 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 05/16/2020 No Yes failure [id number].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 Chronic gout, unspecified, with tophus (tophi) 05/16/2020 No Yes N18.4 Chronic kidney disease, stage 4 (severe) 05/16/2020 No Yes Inactive Problems Resolved Problems ICD-10 Code Description Active Date Resolved Date L97.919 Non-pressure chronic ulcer of unspecified part of right lower leg with unspecified 05/16/2020 05/16/2020 severity Electronic Signature(s) Signed: 07/21/2020 5:24:41 PM By: Linton Ham MD Entered By: Linton Ham on 07/21/2020 14:31:55 -------------------------------------------------------------------------------- Progress Note Details Patient Name: Date of Service: Harry Foster 07/21/2020 1:45 PM Medical Record Number: [id number] Patient Account Number: [id number] Date of Birth/Sex: Treating RN: 11-Mar-1945 (75 y.o. Harry Foster, Harry Foster Primary Care Provider: [name] Other Clinician: Referring Provider: Treating Provider/Extender: Rowland Lathe Weeks in Treatment: 9 Subjective History of Present Illness (HPI) Mr. Nathian Stencil Foster is a 74 year old male with a past medical history of persistent atrial fibrillation on Coumadin, hypertension, gout, CHF And venous insufficiency that presents to the clinic for bilateral lower extremity wounds. He  states he has a history of blistering to his legs that eventually Foster and heal. He states that in the past 6 weeks he has been unable to heal his wounds. He keeps them wrapped however they soak through quickly and has to change the wraps frequently. He is unable to wear compression stocking due to tophaceous gout in  his hands. He sleeps in a recliner at night and keeps his legs down due to increased pain when they are elevated. He states this symptom started a few weeks ago. He denies signs of infection. 5/23; patient presents for 1 week follow-up. He had Kerlix/Coban wrap placed at last clinic visit and changed again last week due to increased drainage. He states that there has been an improvement in swelling and drainage over the past week. He is going to have arterial studies done tomorrow. He denies signs of infection. 5/31; patient presents for 1 week follow-up. He has been doing well with Kerlix/Coban wrap. He denies signs of infection. 6/7; patient presents for 1 week follow-up. He has been using Kerlix/Coban wraps bilaterally with collagen underneath. He has tolerated these well. He denies signs of infection. The right leg no longer has wounds. He has his [name] wraps today 6/14; the patient is effectively healed on the left leg. He already is wearing his [name] wraps on the right leg he has 20/30 stockings below this. He has a stocking for the left leg. He says he is going to go to elastic therapy for 20/30 stockings to put under the Farrow wraps 6/30; patient was discharged from the clinic 2 weeks ago for closed wounds. He reports using Farrow wraps bilaterally. He says he noticed in the past week he was developing new wounds. He denies signs of infection. 7/12; patient presents for follow-up. He has been tolerating the Kerlix/Coban well. When the wrap was taken off today he noticed a blister and this opened in the clinic. He denies signs of infection. 7/21. The patient arrives healed.  Previously followed by Dr. Heber [employer]. He has severe chronic venous insufficiency. The blister he noted last week on the left anterior leg is closed. He has Farrow wrap stockings he is familiar with these. I cannot really determine from him today whether he was actually using these when these wounds formed. Objective Constitutional Sitting or standing Blood Pressure is within target range for patient.. Pulse regular and within target range for patient.Marland Kitchen Respirations regular, non-labored and within target range.. Temperature is normal and within the target range for the patient.Marland Kitchen Appears in no distress. Vitals Time Taken: 1:58 PM, Height: 71 in, Weight: 295 lbs, BMI: 41.1, Temperature: 98.3 F, Pulse: 73 bpm, Respiratory Rate: 18 breaths/min, Blood Pressure: 106/69 mmHg. General Notes: Wound exam; left lower extremity he has a healed area on the left anterior lower extremity left medial. He still has mild edema but much better than described last week. Very significant changes of chronic venous insufficiency in his legs but no open wounds. Integumentary (Hair, Skin) Wound #5 status is Healed - Epithelialized. Original cause of wound was Blister. The date acquired was: 06/27/2020. The wound has been in treatment 3 weeks. The wound is located on the [facility] Lower Leg. The wound measures 0cm length x 0cm width x 0cm depth; 0cm^2 area and 0cm^3 volume. There is no tunneling or undermining noted. There is a none present amount of drainage noted. The wound margin is flat and intact. There is no granulation within the wound bed. There is no necrotic tissue within the wound bed. Wound #7 status is Healed - Epithelialized. Original cause of wound was Blister. The date acquired was: 07/12/2020. The wound has been in treatment 1 weeks. The wound is located on the Left,Distal,Medial Lower Leg. The wound measures 0cm length x 0cm width x 0cm depth; 0cm^2 area and 0cm^3 volume. There is no tunneling or  undermining noted. There is a none present amount of drainage noted. The wound margin is flat and intact. There is no granulation within the wound bed. There is no necrotic tissue within the wound bed. 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 Edema Control - Lymphedema / SCD / Other: Elevate legs to the level of the heart or above for 30 minutes daily and/or when sitting, a frequency of: - 3-4 times a day throughout the day. Avoid standing for long periods of time. Exercise regularly Moisturize legs daily. - every night before bed. Compression stocking or Garment 30-40 mm/Hg pressure to: - Farrow wrap apply in the morning and remove at night. 1. The patient can be discharged from the wound care center to his own stockings which I think involves a support stocking with the Farrow wrap. I recommended 30/40 mm equivalent compression 2. Skin moisturizers etc. Electronic Signature(s) Signed: 07/21/2020 5:24:41 PM By: Linton Ham MD Entered By: Linton Ham on 07/21/2020 14:38:09 -------------------------------------------------------------------------------- SuperBill Details Patient Name: Date of Service: Harry Foster 07/21/2020 Medical Record Number: [id number] Patient Account Number: [id number] Date of Birth/Sex: Treating RN: July 02, 1945 (75 y.o. 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

## 2020-07-25 NOTE — Progress Notes (Signed)
Harry Foster ([id number]) Visit Report for 07/21/2020 Arrival Information Details Patient Name: Date of Service: Harry Foster 07/21/2020 1:45 PM Medical Record Number: [id number] Patient Account Number: [id number] Date of Birth/Sex: Treating RN: May 28, 1945 (75 y.o. Harry Foster Primary Care Margel Joens: [name] Other Clinician: Referring Venetia Prewitt: Treating Karleigh Bunte/Extender: Rowland Lathe Weeks in Treatment: 9 Visit Information History Since Last Visit Added or deleted any medications: No Patient Arrived: Ambulatory Any new allergies or adverse reactions: No Arrival Time: 13:58 Had a fall or experienced change in No Accompanied By: alone activities of daily living that may affect Transfer Assistance: None risk of falls: Patient Identification Verified: Yes Signs or symptoms of abuse/neglect since last visito No Secondary Verification Process Completed: Yes Hospitalized since last visit: No Patient Requires Transmission-Based Precautions: No Implantable device outside of the clinic excluding No Patient Has Alerts: Yes cellular tissue based products placed in the center Patient Alerts: R ABI: 1.63 TBI: 1.36 since last visit: L ABI: 1.49 TBI: 1.34 Has Dressing in Place as Prescribed: Yes 05/2020 Has Compression in Place as Prescribed: Yes Pain Present Now: No Electronic Signature(s) Signed: 07/25/2020 4:44:07 PM By: Levan [name] RN, BSN Entered By: Levan [name] on 07/21/2020 14:04:12 -------------------------------------------------------------------------------- Clinic Level of Care Assessment Details Patient Name: Date of Service: Harry Foster. 07/21/2020 1:45 PM Medical Record Number: [id number] Patient Account Number: [id number] Date of Birth/Sex: Treating RN: 11-09-1945 (75 y.o. Harry Foster: [name] Other Clinician: Referring Tamryn Popko: Treating Delores Thelen/Extender: Rowland Lathe Weeks in Treatment: 9 Clinic Level of Care Assessment Items TOOL 4 Quantity Score X- 1 0 Use when only an EandM is performed on FOLLOW-UP visit ASSESSMENTS - Nursing Assessment / Reassessment X- 1 10 Reassessment of Co-morbidities (includes updates in patient status) X- 1 5 Reassessment of Adherence to Treatment Plan ASSESSMENTS - Wound and Skin A ssessment / Reassessment []  - 0 Simple Wound Assessment / Reassessment - one wound X- 2 5 Complex Wound Assessment / Reassessment - multiple wounds X- 1 10 Dermatologic / Skin Assessment (not related to wound area) ASSESSMENTS - Focused Assessment X- 1 5 Circumferential Edema Measurements - multi extremities X- 1 10 Nutritional Assessment / Counseling / Intervention []  - 0 Lower Extremity Assessment (monofilament, tuning fork, pulses) []  - 0 Peripheral Arterial Disease Assessment (using hand held doppler) ASSESSMENTS - Ostomy and/or Continence Assessment and Care []  - 0 Incontinence Assessment and Management []  - 0 Ostomy Care Assessment and Management (repouching, etc.) PROCESS - Coordination of Care []  - 0 Simple Patient / Family Education for ongoing care X- 1 20 Complex (extensive) Patient / Family Education for ongoing care X- 1 10 Staff obtains Programmer, systems, Records, T Results / Process Orders est []  - 0 Staff telephones HHA, Nursing Homes / Clarify orders / etc []  - 0 Routine Transfer to another Facility (non-emergent condition) []  - 0 Routine Hospital Admission (non-emergent condition) []  - 0 New Admissions / Biomedical engineer / Ordering NPWT Apligraf, etc. , []  - 0 Emergency Hospital Admission (emergent condition) []  - 0 Simple Discharge Coordination X- 1 15 Complex (extensive) Discharge Coordination PROCESS - Special Needs []  - 0 Pediatric / Minor Patient Management []  - 0 Isolation Patient Management []  - 0 Hearing / Language / Visual special needs []  - 0 Assessment  of Community assistance (transportation, D/C planning, etc.) []  - 0 Additional assistance / Altered mentation []  - 0 Support Surface(s) Assessment (bed, cushion, seat, etc.) INTERVENTIONS -  Wound Cleansing / Measurement []  - 0 Simple Wound Cleansing - one wound X- 2 5 Complex Wound Cleansing - multiple wounds X- 1 5 Wound Imaging (photographs - any number of wounds) []  - 0 Wound Tracing (instead of photographs) []  - 0 Simple Wound Measurement - one wound X- 2 5 Complex Wound Measurement - multiple wounds INTERVENTIONS - Wound Dressings []  - 0 Small Wound Dressing one or multiple wounds []  - 0 Medium Wound Dressing one or multiple wounds X- 1 20 Large Wound Dressing one or multiple wounds []  - 0 Application of Medications - topical []  - 0 Application of Medications - injection INTERVENTIONS - Miscellaneous []  - 0 External ear exam []  - 0 Specimen Collection (cultures, biopsies, blood, body fluids, etc.) []  - 0 Specimen(s) / Culture(s) sent or taken to Lab for analysis []  - 0 Patient Transfer (multiple staff / Civil Service fast streamer / Similar devices) []  - 0 Simple Staple / Suture removal (25 or less) []  - 0 Complex Staple / Suture removal (26 or more) []  - 0 Hypo / Hyperglycemic Management (close monitor of Blood Glucose) []  - 0 Ankle / Brachial Index (ABI) - do not check if billed separately X- 1 5 Vital Signs Has the patient been seen at the hospital within the last three years: Yes Total Score: 145 Level Of Care: New/Established - Level 4 Electronic Signature(s) Signed: 07/21/2020 6:31:47 PM By: Deon Pilling Entered By: Deon Pilling on 07/21/2020 14:20:05 -------------------------------------------------------------------------------- Encounter Discharge Information Details Patient Name: Date of Service: Harry Foster 07/21/2020 1:45 PM Medical Record Number: [id number] Patient Account Number: [id number] Date of Birth/Sex: Treating RN: 1945/02/11 (75 y.o. Harry Foster Primary Care Fendi Meinhardt: [name] Other Clinician: Referring Lemon Sternberg: Treating Yitty Roads/Extender: Rowland Lathe Weeks in Treatment: 9 Encounter Discharge Information Items Discharge Condition: Stable Ambulatory Status: Ambulatory Discharge Destination: Home Transportation: Private Auto Accompanied By: self Schedule Follow-up Appointment: No Clinical Summary of Care: Electronic Signature(s) Signed: 07/21/2020 6:31:47 PM By: Deon Pilling Entered By: Deon Pilling on 07/21/2020 14:20:38 -------------------------------------------------------------------------------- Lower Extremity Assessment Details Patient Name: Date of Service: Harry Foster 07/21/2020 1:45 PM Medical Record Number: [id number] Patient Account Number: [id number] Date of Birth/Sex: Treating RN: March 14, 1945 (76 y.o. Harry Foster Primary Care Vittoria Noreen: [name] Other Clinician: Referring Brandin Dilday: Treating Grisela Mesch/Extender: Rowland Lathe Weeks in Treatment: 9 Edema Assessment Assessed: Shirlyn Goltz: No] Patrice Paradise: No] Edema: [Left: Ye] [Right: s] Calf Left: Right: Point of Measurement: From Medial Instep 41 cm Ankle Left: Right: Point of Measurement: From Medial Instep 26 cm Vascular Assessment Pulses: Dorsalis Pedis Palpable: [Left:Yes] Electronic Signature(s) Signed: 07/25/2020 4:44:07 PM By: Levan [name] RN, BSN Entered By: Levan [name] on 07/21/2020 14:05:09 -------------------------------------------------------------------------------- Multi Wound Chart Details Patient Name: Date of Service: Harry Foster 07/21/2020 1:45 PM Medical Record Number: [id number] Patient Account Number: [id number] Date of Birth/Sex: Treating RN: [birth year] (75 y.o. [name], [name] Primary Care Sherby Moncayo: [name] Other Clinician: Referring Bryley Kovacevic: Treating Demitria Hay/Extender: Rowland Lathe Weeks in Treatment: 9 Vital Signs Height(in): 49 Pulse(bpm): 63 Weight(lbs): 403 Blood Pressure(mmHg): 106/69 Body Mass Index(BMI): 41 Temperature(F): 98.3 Respiratory Rate(breaths/min): 18 Photos: [5:No Photos Left, Distal, Anterior Lower Leg] [7:No Photos Left, Distal, Medial Lower Leg] [N/A:N/A N/A] Wound Location: [5:Blister] [7:Blister] [N/A:N/A] Wounding Event: [5:Venous Leg Ulcer] [7:Venous Leg Ulcer] [N/A:N/A] Primary Etiology: [5:Cataracts, Arrhythmia, Congestive Cataracts, Arrhythmia, Congestive N/A] Comorbid History: [5:Heart Failure, Hypertension, Peripheral Heart Failure, Hypertension, Peripheral Venous Disease, Gout,  Osteoarthritis Venous Disease, Gout, Osteoarthritis 06/27/2020] [7:07/12/2020] [N/A:N/A] Date Acquired: [5:3] [7:1] [N/A:N/A] Weeks of Treatment: [5:Healed - Epithelialized] [7:Healed - Epithelialized] [N/A:N/A] Wound Status: [5:0x0x0] [7:0x0x0] [N/A:N/A] Measurements L x W x D (cm) [5:0] [7:0] [N/A:N/A] A (cm) : rea [5:0] [7:0] [N/A:N/A] Volume (cm) : [5:100.00%] [7:100.00%] [N/A:N/A] % Reduction in Area: [5:100.00%] [7:100.00%] [N/A:N/A] % Reduction in Volume: [5:Full Thickness Without Exposed] [7:Full Thickness Without Exposed] [N/A:N/A] Classification: [5:Support Structures None Present] [7:Support Structures None Present] [N/A:N/A] Exudate Amount: [5:Flat and Intact] [7:Flat and Intact] [N/A:N/A] Wound Margin: [5:None Present (0%)] [7:None Present (0%)] [N/A:N/A] Granulation Amount: [5:None Present (0%)] [7:None Present (0%)] [N/A:N/A] Necrotic Amount: [5:Fascia: No] [7:Fascia: No] [N/A:N/A] Exposed Structures: [5:Fat Layer (Subcutaneous Tissue): No Fat Layer (Subcutaneous Tissue): No Tendon: No Muscle: No Joint: No Bone: No Large (67-100%)] [7:Tendon: No Muscle: No Joint: No Bone: No Large (67-100%)] [N/A:N/A] Treatment Notes Electronic Signature(s) Signed: 07/21/2020 5:24:41 PM By: Linton Ham MD Signed: 07/21/2020 7:48:44 PM By:  Rhae Hammock RN Entered By: Linton Ham on 07/21/2020 14:32:04 -------------------------------------------------------------------------------- Multi-Disciplinary Care Plan Details Patient Name: Date of Service: Harry Foster 07/21/2020 1:45 PM Medical Record Number: [id number] Patient Account Number: [id number] Date of Birth/Sex: Treating RN: [birth year] (75 y.o. Harry Foster Primary Care Fidelia Cathers: [name] Other Clinician: Referring Kalvyn Desa: Treating Ekta Dancer/Extender: Rowland Lathe Weeks in Treatment: 9 Multidisciplinary Care Plan reviewed with physician Active Inactive Electronic Signature(s) Signed: 07/21/2020 6:31:47 PM By: Deon Pilling Entered By: Deon Pilling on 07/21/2020 14:19:18 -------------------------------------------------------------------------------- Pain Assessment Details Patient Name: Date of Service: Harry Foster 07/21/2020 1:45 PM Medical Record Number: [id number] Patient Account Number: [id number] Date of Birth/Sex: Treating RN: 1945-06-08 (75 y.o. Harry Foster Primary Care Alieu Finnigan: [name] Other Clinician: Referring Karliah Kowalchuk: Treating Rayne Loiseau/Extender: Rowland Lathe Weeks in Treatment: 9 Active Problems Location of Pain Severity and Description of Pain Patient Has Paino No Site Locations Pain Management and Medication Current Pain Management: Electronic Signature(s) Signed: 07/25/2020 4:44:07 PM By: Levan [name] RN, BSN Entered By: Levan [name] on 07/21/2020 14:04:55 -------------------------------------------------------------------------------- Patient/Caregiver Education Details Patient Name: Date of Service: Harry Foster 7/21/2022andnbsp1:45 PM Medical Record Number: [id number] Patient Account Number: [id number] Date of Birth/Gender: Treating RN: February 07, 1945 (75 y.o. Harry Foster Primary Care Physician: [name] Other Clinician: Referring Physician: Treating Physician/Extender: Earl Many in Treatment: 9 Education Assessment Education Provided To: Patient Education Topics Provided Wound/Skin Impairment: Handouts: Skin Care Do's and Dont's Methods: Explain/Verbal Responses: Reinforcements needed Electronic Signature(s) Signed: 07/21/2020 6:31:47 PM By: Deon Pilling Entered By: Deon Pilling on 07/21/2020 14:19:29 -------------------------------------------------------------------------------- Wound Assessment Details Patient Name: Date of Service: Harry Foster 07/21/2020 1:45 PM Medical Record Number: [id number] Patient Account Number: [id number] Date of Birth/Sex: Treating RN: [birth year] (75 y.o. Harry Foster Primary Care Mystie Ormand: [name] Other Clinician: Referring Birda Didonato: Treating Marabella Popiel/Extender: Rowland Lathe Weeks in Treatment: 9 Wound Status Wound Number: [id number] Primary Venous Leg Ulcer Etiology: Wound Location: Left, Distal, Anterior Lower Leg Wound Healed - Epithelialized Wounding Event: Blister Status: Date Acquired: 06/27/2020 Comorbid Cataracts, Arrhythmia, Congestive Heart Failure, Hypertension, Weeks Of Treatment: 3 History: Peripheral Venous Disease, Gout, Osteoarthritis Clustered Wound: No Photos Photo Uploaded By: Donavan Burnet on 07/22/2020 08:47:32 Wound Measurements Length: (cm) Width: (cm) Depth: (cm) Area: (cm) Volume: (cm) 0 % Reduction in Area: 100% 0 % Reduction in Volume: 100% 0 Epithelialization: Large (67-100%) 0 Tunneling: No 0 Undermining: No Wound  Description Classification: Full Thickness Without Exposed Support Structures Wound Margin: Flat and Intact Exudate Amount: None Present Foul Odor After Cleansing: No Slough/Fibrino No Wound Bed Granulation Amount: None Present (0%) Exposed Structure Necrotic Amount: None Present (0%) Fascia Exposed:  No Fat Layer (Subcutaneous Tissue) Exposed: No Tendon Exposed: No Muscle Exposed: No Joint Exposed: No Bone Exposed: No Electronic Signature(s) Signed: 07/25/2020 4:44:07 PM By: Levan [name] RN, BSN Entered By: Levan [name] on 07/21/2020 14:05:28 -------------------------------------------------------------------------------- Wound Assessment Details Patient Name: Date of Service: Harry Foster 07/21/2020 1:45 PM Medical Record Number: [id number] Patient Account Number: [id number] Date of Birth/Sex: Treating RN: Mar 23, 1945 (75 y.o. Harry Foster Primary Care Maliki Gignac: [name] Other Clinician: Referring Airiana Elman: Treating Teiara Baria/Extender: Rowland Lathe Weeks in Treatment: 9 Wound Status Wound Number: 7 Primary Venous Leg Ulcer Etiology: Wound Location: Left, Distal, Medial Lower Leg Wound Healed - Epithelialized Wounding Event: Blister Status: Date Acquired: 07/12/2020 Comorbid Cataracts, Arrhythmia, Congestive Heart Failure, Hypertension, Weeks Of Treatment: 1 History: Peripheral Venous Disease, Gout, Osteoarthritis Clustered Wound: No Photos Photo Uploaded By: Donavan Burnet on 07/22/2020 08:47:35 Wound Measurements Length: (cm) Width: (cm) Depth: (cm) Area: (cm) Volume: (cm) 0 % Reduction in Area: 100% 0 % Reduction in Volume: 100% 0 Epithelialization: Large (67-100%) 0 Tunneling: No 0 Undermining: No Wound Description Classification: Full Thickness Without Exposed Support Structu Wound Margin: Flat and Intact Exudate Amount: None Present res Foul Odor After Cleansing: No Slough/Fibrino No Wound Bed Granulation Amount: None Present (0%) Exposed Structure Necrotic Amount: None Present (0%) Fascia Exposed: No Fat Layer (Subcutaneous Tissue) Exposed: No Tendon Exposed: No Muscle Exposed: No Joint Exposed: No Bone Exposed: No Electronic Signature(s) Signed: 07/25/2020 4:44:07 PM By: Levan [name] RN,  BSN Entered By: Levan [name] on 07/21/2020 14:05:42 -------------------------------------------------------------------------------- Rankin Details Patient Name: Date of Service: Harry Foster. 07/21/2020 1:45 PM Medical Record Number: [id number] Patient Account Number: [id number] Date of Birth/Sex: Treating RN: 1945-08-10 (75 y.o. Harry Foster Primary Care Lavonia Eager: [name] Other Clinician: Referring Kalvin Buss: Treating Ranger Petrich/Extender: Rowland Lathe Weeks in Treatment: 9 Vital Signs Time Taken: 13:58 Temperature (F): 98.3 Height (in): 71 Pulse (bpm): 73 Weight (lbs): 295 Respiratory Rate (breaths/min): 18 Body Mass Index (BMI): 41.1 Blood Pressure (mmHg): 106/69 Reference Range: 80 - 120 mg / dl Electronic Signature(s) Signed: 07/25/2020 4:44:07 PM By: Levan [name] RN, BSN Entered By: Levan [name] on 07/21/2020 14:04:37

## 2020-08-01 ENCOUNTER — Other Ambulatory Visit: Payer: Self-pay

## 2020-08-01 ENCOUNTER — Encounter (HOSPITAL_BASED_OUTPATIENT_CLINIC_OR_DEPARTMENT_OTHER): Payer: Medicare Other | Attending: Internal Medicine | Admitting: Internal Medicine

## 2020-08-01 DIAGNOSIS — M1A9XX1 Chronic gout, unspecified, with tophus (tophi): Secondary | ICD-10-CM | POA: Insufficient documentation

## 2020-08-01 DIAGNOSIS — Z7901 Long term (current) use of anticoagulants: Secondary | ICD-10-CM | POA: Diagnosis not present

## 2020-08-01 DIAGNOSIS — I890 Lymphedema, not elsewhere classified: Secondary | ICD-10-CM | POA: Diagnosis not present

## 2020-08-01 DIAGNOSIS — Z6841 Body Mass Index (BMI) 40.0 and over, adult: Secondary | ICD-10-CM | POA: Diagnosis not present

## 2020-08-01 DIAGNOSIS — N184 Chronic kidney disease, stage 4 (severe): Secondary | ICD-10-CM | POA: Insufficient documentation

## 2020-08-01 DIAGNOSIS — I872 Venous insufficiency (chronic) (peripheral): Secondary | ICD-10-CM | POA: Insufficient documentation

## 2020-08-01 DIAGNOSIS — I130 Hypertensive heart and chronic kidney disease with heart failure and stage 1 through stage 4 chronic kidney disease, or unspecified chronic kidney disease: Secondary | ICD-10-CM | POA: Insufficient documentation

## 2020-08-01 DIAGNOSIS — I504 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 failure: Secondary | ICD-10-CM | POA: Diagnosis not present

## 2020-08-01 DIAGNOSIS — L97829 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ity: Secondary | ICD-10-CM

## 2020-08-01 DIAGNOSIS — L97919 Non-pressure chronic ulcer of unspecified part of right lower leg with unspecified severity: Secondary | ICD-10-CM | POA: Insufficient documentation

## 2020-08-01 DIAGNOSIS — L97929 Non-pressure chronic ulcer of unspecified part of left lower leg with unspecified severity: Secondary | ICD-10-CM | POA: Diagnosis not present

## 2020-08-01 DIAGNOSIS — I4819 Other persistent atrial fibrillation: Secondary | ICD-10-CM | POA: Diagnosis not present

## 2020-08-01 NOTE — Progress Notes (Addendum)
CAILEN, MIHALIK ([id number]) Visit Report for 08/01/2020 Chief Complaint Document Details Patient Name: Date of Service: Harry Foster 08/01/2020 2:30 PM Medical Record Number: [id number] Patient Account Number: [id number] Date of Birth/Sex: Treating RN: [birth year] (75 y.o. [name] Primary Care Provider: [name] Other Clinician: Referring Provider: Treating Provider/Extender: [name] in Treatment: 11 Information Obtained from: Patient Chief Complaint Left lower extremity skin breakdown Electronic Signature(s) Signed: 08/01/2020 3:58:49 PM By: Kalman Shan DO Entered By: Kalman Shan on 08/01/2020 15:53:15 -------------------------------------------------------------------------------- HPI Details Patient Name: Date of Service: Harry Foster 08/01/2020 2:30 PM Medical Record Number: [id number] Patient Account Number: [id number] Date of Birth/Sex: Treating RN: 07-Feb-1945 (75 y.o. [name] Primary Care Provider: [name] Other Clinician: Referring Provider: Treating Provider/Extender: [name] in Treatment: 11 History of Present Illness HPI Description: Mr. Harry Foster is a 75 year old male with a past medical history of persistent atrial fibrillation on Coumadin, hypertension, gout, CHF And venous insufficiency that presents to the clinic for bilateral lower extremity wounds. He states he has a history of blistering to his legs that eventually pop and heal. He states that in the past 6 weeks he has been unable to heal his wounds. He keeps them wrapped however they soak through quickly and has to change the wraps frequently. 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 He sleeps in a recliner at night and keeps his legs down due to increased pain when they are elevated. He states this symptom started a few weeks ago. He denies  signs of infection. 5/23; patient presents for 1 week follow-up. He had Kerlix/Coban wrap placed at last clinic visit and changed again last week due to increased drainage. He states that there has been an improvement in swelling and drainage over the past week. He is going to have arterial studies done tomorrow. He denies signs of infection. 5/31; patient presents for 1 week follow-up. He has been doing well with Kerlix/Coban wrap. He denies signs of infection. 6/7; patient presents for 1 week follow-up. He has been using Kerlix/Coban wraps bilaterally with collagen underneath. He has tolerated these well. He denies signs of infection. The right leg no longer has wounds. He has his [name] wraps today 6/14; the patient is effectively healed on the left leg. He already is wearing his [name] wraps on the right leg he has 20/30 stockings below this. He has a stocking for the left leg. He says he is going to go to elastic therapy for 20/30 stockings to put under the Farrow wraps 6/30; patient was discharged from the clinic 2 weeks ago for closed wounds. He reports using Farrow wraps bilaterally. He says he noticed in the past week he was developing new wounds. He denies signs of infection. 7/12; patient presents for follow-up. He has been tolerating the Kerlix/Coban well. When the wrap was taken off today he noticed a blister and this opened in the clinic. He denies signs of infection. 7/21. The patient arrives healed. Previously followed by Dr. [name]. He has severe chronic venous insufficiency. The blister he noted last week on the left anterior leg is closed. He has Farrow wrap stockings he is familiar with these. I cannot really determine from him today whether he was actually using these when these wounds formed. Readmission 8/1 Patient presents again for skin breakdown to his left lower extremity. He was discharged less  than 2 weeks ago for a wound to his left lower extremity. He states that on  discharge the following day he had a blister that opened and popped. Its been healing since. He has been tightly wrapping his leg from the ankle up and placing a Farrow wrap over this. He currently denies signs of infection. Electronic Signature(s) Signed: 08/01/2020 3:58:49 PM By: Kalman Shan DO Entered By: Kalman Shan on 08/01/2020 15:54:43 -------------------------------------------------------------------------------- Physical Exam Details Patient Name: Date of Service: Harry Foster 08/01/2020 2:30 PM Medical Record Number: [id number] Patient Account Number: [id number] Date of Birth/Sex: Treating RN: [birth year] (75 y.o. [name] Primary Care Provider: [name] Other Clinician: Referring Provider: Treating Provider/Extender: [name] in Treatment: 11 Constitutional respirations regular, non-labored and within target range for patient.Marland Kitchen Psychiatric pleasant and cooperative. Notes Left lower extremity: Scattered areas of skin breakdown with irritation noted. No signs of infection including increased warmth, erythema or purulent drainage. Electronic Signature(s) Signed: 08/01/2020 3:58:49 PM By: Kalman Shan DO Entered By: Kalman Shan on 08/01/2020 15:55:42 -------------------------------------------------------------------------------- Physician Orders Details Patient Name: Date of Service: Harry Foster 08/01/2020 2:30 PM Medical Record Number: [id number] Patient Account Number: [id number] Date of Birth/Sex: Treating RN: Apr 12, 1945 (75 y.o. 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 - with Dr. Heber [place] Return A Nurse Visit: - 1 week Monday Edema Control - Lymphedema / SCD / Other Elevate legs to the level of the heart or above for 30 minutes daily and/or when sitting, a frequency of: - 3-4 times a day throughout the day. Avoid standing for long periods of time. Exercise regularly Moisturize legs daily. - every night before bed. Compression stocking or Garment 30-40 mm/Hg pressure to: - Farrow wrap apply in the morning and remove at night. right leg daily Wound Treatment Wound #7R - Lower Leg Wound Laterality: Left, Medial, Distal Peri-Wound Care: Triamcinolone 15 (g) Discharge Instructions: Use triamcinolone 15 (g) as directed Peri-Wound Care: Sween Lotion (Moisturizing lotion) Discharge Instructions: Apply moisturizing lotion as directed Prim Dressing: Hydrofera Blue Ready Foam, 4x5 in ary Discharge Instructions: Apply to wound bed as instructed Secondary Dressing: Woven Gauze Sponge, Non-Sterile 4x4 in Discharge Instructions: Apply over primary dressing as directed. Compression Wrap: ThreePress (3 layer compression wrap) Discharge Instructions: Apply three layer compression as directed. Electronic Signature(s) Signed: 08/01/2020 3:58:49 PM By: Kalman Shan DO Entered By: Kalman Shan on 08/01/2020 15:55:56 -------------------------------------------------------------------------------- Problem List Details Patient Name: Date of Service: Harry Foster 08/01/2020 2:30 PM Medical Record Number: [id number] Patient Account Number: [id number] Date of Birth/Sex: Treating  RN: [birth year] (75 y.o. [name] Primary Care Provider: [name] Other Clinician: Referring Provider: Treating Provider/Extender: [name] in Treatment: 11 Active Problems ICD-10 Encounter Code Description Active Date MDM Diagnosis L97.829 Non-pressure chronic ulcer of other part of left lower leg with unspecified 05/16/2020 No Yes severity I87.2 Venous insufficiency (chronic) (peripheral) 05/16/2020 No Yes I48.19 Other  persistent atrial fibrillation 05/16/2020 No Yes Z79.01 Long term (current) use of anticoagulants 05/16/2020 No Yes I50.40 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 05/16/2020 No Yes failure M1A.9XX1 Chronic gout, unspecified, with tophus (tophi) 05/16/2020 No Yes N18.4 Chronic kidney disease, stage 4 (severe) 05/16/2020 No Yes Inactive Problems Resolved Problems ICD-10 Code Description Active Date Resolved Date L97.919 Non-pressure chronic ulcer of unspecified part of right lower leg with unspecified 05/16/2020 05/16/2020 severity Electronic Signature(s) Signed: 08/01/2020 3:58:49 PM By: Kalman Shan DO Entered By: Kalman Shan on 08/01/2020 15:52:54 -------------------------------------------------------------------------------- Progress Note Details Patient Name: Date of Service: Harry Foster. 08/01/2020 2:30 PM Medical Record Number: [id number] Patient Account Number: [id number] Date of Birth/Sex: Treating RN: 03/09/1945 (75 y.o. [name] Primary Care Provider: [name] Other Clinician: Referring Provider: Treating Provider/Extender: [name] in Treatment: 11 Subjective Chief Complaint Information obtained from Patient Left lower extremity skin breakdown History of Present Illness (HPI) Mr. Lavelle Akel Foster is a 75 year old male with a past medical history of persistent atrial fibrillation on Coumadin,  hypertension, gout, CHF And venous insufficiency that presents to the clinic for bilateral lower extremity wounds. He states he has a history of blistering to his legs that eventually pop and heal. He states that in the past 6 weeks he has been unable to heal his wounds. He keeps them wrapped however they soak through quickly and has to change the wraps frequently. 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 He sleeps in a recliner at night and keeps his legs down due to increased pain when they are elevated. He states this symptom started a few weeks ago. He denies signs of infection. 5/23; patient presents for 1 week follow-up. He had Kerlix/Coban wrap placed at last clinic visit and changed again last week due to increased drainage. He states that there has been an improvement in swelling and drainage over the past week. He is going to have arterial studies done tomorrow. He denies signs of infection. 5/31; patient presents for 1 week follow-up. He has been doing well with Kerlix/Coban wrap. He denies signs of infection. 6/7; patient presents for 1 week follow-up. He has been using Kerlix/Coban wraps bilaterally with collagen underneath. He has tolerated these well. He denies signs of infection. The right leg no longer has wounds. He has his [name] wraps today 6/14; the patient is effectively healed on the left leg. He already is wearing his [name] wraps on the right leg he has 20/30 stockings below this. He has a stocking for the left leg. He says he is going to go to elastic therapy for 20/30 stockings to put under the Farrow wraps 6/30; patient was discharged from the clinic 2 weeks ago for closed wounds. He reports using Farrow wraps bilaterally. He says he noticed in the past week he was developing new wounds. He denies signs of infection. 7/12; patient presents for follow-up. He has been tolerating the Kerlix/Coban well. When the wrap was taken off today he  noticed a blister and this opened in the clinic. He denies signs of infection. 7/21. The patient arrives healed. Previously followed by Dr. Heber [place]. He has severe chronic venous insufficiency. The blister he noted last week on the left anterior leg is closed. He has Farrow wrap stockings he is familiar with these. I cannot really determine from him today whether he was actually using these when these wounds formed. Readmission 8/1 Patient presents  again for skin breakdown to his left lower extremity. He was discharged less than 2 weeks ago for a wound to his left lower extremity. He states that on discharge the following day he had a blister that opened and popped. Its been healing since. He has been tightly wrapping his leg from the ankle up and placing a Farrow wrap over this. He currently denies signs of infection. Patient History Information obtained from Patient. Family History Cancer - Mother,Siblings, Heart Disease - Father, No family history of Diabetes, Hereditary Spherocytosis, Hypertension, Kidney Disease, Lung Disease, Seizures, Stroke, Thyroid Problems, Tuberculosis. Social History Never smoker, Marital Status - Widowed, Alcohol Use - Never, Drug Use - No History, Caffeine Use - Daily - coffee. Medical History Eyes Patient has history of Cataracts - mild Denies history of Glaucoma, Optic Neuritis Ear/Nose/Mouth/Throat Denies history of Chronic sinus problems/congestion, Middle ear problems Cardiovascular Patient has history of Arrhythmia - afib, Congestive Heart Failure, Hypertension, Peripheral Venous Disease Endocrine Denies history of Type I Diabetes, Type II Diabetes Genitourinary Denies history of End Stage Renal Disease Integumentary (Skin) Denies history of History of Burn Musculoskeletal Patient has history of Gout, Osteoarthritis Denies history of Rheumatoid Arthritis, Osteomyelitis Oncologic Denies history of Received Chemotherapy, Received  Radiation Psychiatric Denies history of Anorexia/bulimia, Confinement Anxiety Hospitalization/Surgery History - bil knee replacements. Medical A Surgical History Notes nd Constitutional Symptoms (General Health) morbid obesity Genitourinary CKD stage 3 Objective Constitutional respirations regular, non-labored and within target range for patient.. Vitals Time Taken: 2:51 PM, Height: 71 in, Weight: 295 lbs, BMI: 41.1, Temperature: 98.4 F, Pulse: 66 bpm, Respiratory Rate: 18 breaths/min, Blood Pressure: 101/65 mmHg. Psychiatric pleasant and cooperative. General Notes: Left lower extremity: Scattered areas of skin breakdown with irritation noted. No signs of infection including increased warmth, erythema or purulent drainage. Integumentary (Hair, Skin) Wound #7R status is Open. Original cause of wound was Blister. The date acquired was: 07/12/2020. The wound has been in treatment 2 weeks. The wound is located on the Left,Distal,Medial Lower Leg. The wound measures 5.5cm length x 5cm width x 0.1cm depth; 21.598cm^2 area and 2.16cm^3 volume. There is Fat Layer (Subcutaneous Tissue) exposed. There is no tunneling or undermining noted. There is a medium amount of serosanguineous drainage noted. The wound margin is flat and intact. There is large (67-100%) red granulation within the wound bed. There is no necrotic tissue within the wound bed. 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 No signs of infection on exam. I recommended Hydrofera Blue and TCA lotion to  this area under 3 layer compression. I recommended taking the wrap off if he cannot tolerate this over the next week. I also recommend in the future not to wrap his leg from the ankle up but to follow-up with [place] and use his Farrow wrap In the meantime.. Procedures Wound #7R Pre-procedure diagnosis of Wound #7R is a Venous Leg Ulcer located on the Left,Distal,Medial Lower Leg . There was a Three Layer Compression Therapy Procedure by Baruch Gouty, RN. Post procedure Diagnosis Wound #7R: Same as Pre-Procedure Plan Follow-up Appointments: Return Appointment in 2 weeks. - with Dr. Heber [place] Nurse Visit: - 1 week Monday Edema Control - Lymphedema / SCD / Other:  Elevate legs to the level of the heart or above for 30 minutes daily and/or when sitting, a frequency of: - 3-4 times a day throughout the day. Avoid standing for long periods of time. Exercise regularly Moisturize legs daily. - every night before bed. Compression stocking or Garment 30-40 mm/Hg pressure to: - Farrow wrap apply in the morning and remove at night. right leg daily WOUND #7R: - Lower Leg Wound Laterality: Left, Medial, Distal Peri-Wound Care: Triamcinolone 15 (g) Discharge Instructions: Use triamcinolone 15 (g) as directed Peri-Wound Care: Sween Lotion (Moisturizing lotion) Discharge Instructions: Apply moisturizing lotion as directed Prim Dressing: Hydrofera Blue Ready Foam, 4x5 in ary Discharge Instructions: Apply to wound bed as instructed Secondary Dressing: Woven Gauze Sponge, Non-Sterile 4x4 in Discharge Instructions: Apply over primary dressing as directed. Com pression Wrap: ThreePress (3 layer compression wrap) Discharge Instructions: Apply three layer compression as directed. 1. Hydrofera Blue under 3 layer compression 2. Follow-up next week with nurse visit in 2 weeks with me Electronic Signature(s) Signed: 08/01/2020 3:58:49 PM By: Kalman Shan DO Entered By: Kalman Shan on 08/01/2020  15:58:30 -------------------------------------------------------------------------------- HxROS Details Patient Name: Date of Service: Harry Foster. 08/01/2020 2:30 PM Medical Record Number: [id number] Patient Account Number: [id number] Date of Birth/Sex: Treating RN: 04-05-1945 (75 y.o. [name] Primary Care Provider: [name] Other Clinician: Referring Provider: Treating Provider/Extender: [name] in Treatment: 11 Information Obtained From Patient Constitutional Symptoms (General Health) Medical History: Past Medical History Notes: morbid obesity Eyes Medical History: Positive for: Cataracts - mild Negative for: Glaucoma; Optic Neuritis Ear/Nose/Mouth/Throat Medical History: Negative for: Chronic sinus problems/congestion; Middle ear problems Cardiovascular Medical History: Positive for: Arrhythmia - afib; Congestive Heart Failure; Hypertension; Peripheral Venous Disease Endocrine Medical History: Negative for: Type I Diabetes; Type II Diabetes Genitourinary Medical History: Negative for: End Stage Renal Disease Past Medical History Notes: CKD stage 3 Integumentary (Skin) Medical History: Negative for: History of Burn Musculoskeletal Medical History: Positive for: Gout; Osteoarthritis Negative for: Rheumatoid Arthritis; Osteomyelitis Oncologic Medical History: Negative for: Received Chemotherapy; Received Radiation Psychiatric Medical History: Negative for: Anorexia/bulimia; Confinement Anxiety HBO Extended History Items Eyes: Cataracts Immunizations Pneumococcal Vaccine: Received Pneumococcal Vaccination: Yes Received Pneumococcal Vaccination On or After 60th Birthday: No Implantable Devices No devices added Hospitalization / Surgery History Type of Hospitalization/Surgery bil knee replacements Family and Social History Cancer: Yes - Mother,Siblings; Diabetes: No; Heart Disease: Yes -  Father; Hereditary Spherocytosis: No; Hypertension: No; Kidney Disease: No; Lung Disease: No; Seizures: No; Stroke: No; Thyroid Problems: No; Tuberculosis: No; Never smoker; Marital Status - Widowed; Alcohol Use: Never; Drug Use: No History; Caffeine Use: Daily - coffee; Financial Concerns: No; Food, Clothing or Shelter Needs: No; Support System Lacking: No; Transportation Concerns: No Electronic Signature(s) Signed: 08/01/2020 3:58:49 PM By: Kalman Shan DO Signed: 08/01/2020 5:51:46 PM By: Levan [name] RN, BSN Entered By: Kalman Shan on 08/01/2020 15:54:58 -------------------------------------------------------------------------------- [place] Details Patient Name: Date of Service: Harry Foster 08/01/2020 Medical Record Number: [id number] Patient Account Number: [id number] Date of Birth/Sex: Treating RN: 1945-08-01 (75 y.o. [name] Primary Care Provider: [name] Other Clinician: Referring Provider: Treating Provider/Extender: [name] in Treatment: 11 Diagnosis Coding ICD-10 Codes Code Description [id number] 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ity I87.2 Venous insufficiency (chronic) (peripheral) I48.19 Other persistent atrial fibrillation Z79.01 Long term (current) use of anticoagulants I50.40 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 failure M1A.9XX1  Chronic gout, unspecified, 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, stage 4 (severe) Facility Procedures CPT4 Code: [id number] Description: 30051 - WOUND CARE VISIT-LEV 3 EST PT Modifier: 25 Quantity: 1 CPT4 Code: [id number] Description: (Facility Use Only) 29581LT - [place] VAPOLI LWR LT LEG Modifier: Quantity: 1 Physician Procedures : CPT4 Code Description Modifier [id number] - WC PHYS LEVEL 3 - EST PT ICD-10 Diagnosis Description L97.829 Non-pressure chronic ulcer  of other part of left lower leg with unspecified severity I87.2 Venous insufficiency (chronic) (peripheral) I50.40  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

## 2020-08-05 NOTE — Progress Notes (Signed)
Cardiology Office Note:    Date:  08/08/2020   ID:  Harry Foster, DOB 09-24-1945, MRN [id number]  PCP:  Josetta Huddle, MD  Cardiologist:  Sinclair Grooms, MD   Referring MD: Josetta Huddle, MD   Chief Complaint  Patient presents with   Congestive Heart Failure   Atrial Fibrillation     History of Present Illness:    Harry Foster is a 75 y.o. male with a hx of  atrial fibrillation, hypertension, chronic diastolic heart failure, and chronic anticoagulation therapy.  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  Harry Foster is not having near the difficulty with gout as he once did.  IV infusion therapy has been discontinued.  He follows with Dr. Claudie Leach.  He denies shortness of breath, orthopnea, and chest pain.  He has not had an echocardiogram since 2017.  He follows with Dr. Joylene Grapes of [facility].  He denies any recent change in urinary pattern.  The recommended dose of furosemide was 80 mg twice per day.  The patient did it for a while but then went back to 80 mg/day.  He did this because the p.m. dose of medication was not causing him to urinate.  He was also concerned about a causing flares of gout.  Past Medical History:  Diagnosis Date   Arthritis    both knees and hands and left elbow, hip   Depression    treated for depression when wife was very ill   Diverticulosis    hx - no sx   Dysrhythmia    hx A fib, on anticoagulants   GI bleed 2010   GI bleed    hx   Hypertension    Recurrent upper respiratory infection (URI)    completing treatment for sinus infection - with abx    Past Surgical History:  Procedure Laterality Date   CARDIOVERSION  02/01/2011   Procedure: CARDIOVERSION;  Surgeon: Sinclair Grooms, MD;  Location: [place];  Service: Cardiovascular;  Laterality: N/A;   SINUS EXPLORATION  1992   laser sinus  surgery   TONSILLECTOMY     age 19   TOTAL KNEE ARTHROPLASTY  10/09/2011   Procedure: TOTAL KNEE ARTHROPLASTY;  Surgeon: Hessie Dibble, MD;  Location: [place];  Service: Orthopedics;  Laterality: Left;  left total knee arthroplasty   TOTAL KNEE ARTHROPLASTY Right 10/06/2013   DR DALLDORF   TOTAL KNEE ARTHROPLASTY WITH REVISION COMPONENTS Right 10/06/2013   Procedure: TOTAL KNEE ARTHROPLASTY WITH REVISION COMPONENTS;  Surgeon: Hessie Dibble, MD;  Location: [place];  Service: Orthopedics;  Laterality: Right;   TREATMENT FISTULA ANAL  1992    Current Medications: Current Meds  Medication Sig   ascorbic acid (VITAMIN C) 1000 MG tablet Take 1,000 mg by mouth daily.   aspirin 81 MG EC tablet Take 81 mg by mouth daily. Swallow whole.   chlorhexidine (PERIDEX) 0.12 % solution SMARTSIG:By Mouth   Coenzyme Q10 (CO Q 10) 100 MG CAPS Take 100 mg by mouth daily.   colchicine 0.6 MG tablet Take 0.6 mg by mouth 3 (three) times a week.   DHEA 50 MG TABS Take 50 mg by mouth daily.   digoxin (LANOXIN) 0.125 MG tablet TAKE 1 TABLET BY MOUTH EVERY MONDAY, WEDNESDAY, AND FRIDAY.   furosemide (LASIX) 80 MG tablet Take 1 tablet (80 mg total) by  mouth daily. (Patient taking differently: Take 80 mg by mouth 2 (two) times daily.)   Lactobacillus (ACIDOPHILUS/BIFIDUS PO) Take 100 mg by mouth daily.   Loperamide HCl (IMODIUM PO) Take 1 tablet by mouth daily as needed (loose stools).    magnesium gluconate (MAGONATE) 500 MG tablet Take 250 mg by mouth daily.   metoprolol succinate (TOPROL-XL) 50 MG 24 hr tablet TAKE 2 TABLETS BY MOUTH 2 TIMES DAILY. TAKE WITH OR IMMEDIATELY FOLLOWING A MEAL.   Misc Natural Products (TART CHERRY ADVANCED) CAPS Take 2 capsules by mouth daily.   Multiple Vitamin (MULTIVITAMIN WITH MINERALS) TABS Take 2 tablets by mouth daily.   OLIVE LEAF EXTRACT PO Take 1 tablet by mouth daily.   olmesartan (BENICAR) 40 MG tablet TAKE 1 TABLET BY MOUTH DAILY.   OVER THE COUNTER MEDICATION Take 1  capsule by mouth daily. Berry extract   OVER THE COUNTER MEDICATION Take 1 capsule by mouth daily. Vitamin d 5000 units w/ iodine 1084mcg   Pomegranate, Punica granatum, (POMEGRANATE PO) Take 250 mg by mouth daily.   Saw Palmetto, Serenoa repens, (SAW PALMETTO PO) Take 1,000 mg by mouth daily.   traMADol (ULTRAM) 50 MG tablet Take 50 mg by mouth every 4 (four) hours as needed.   Vit C-Cholecalciferol-Rose Hip [phone] MG-UNIT-MG CAPS Take 350 mg by mouth daily.   VITAMIN E PO Take 360 mg by mouth daily.   warfarin (COUMADIN) 5 MG tablet TAKE 1 TO 2 TABLETS BY MOUTH DAILY OR AS DIRECTED BY THE COUMADIN CLINIC.     Allergies:   Hydrochlorothiazide, Levaquin [levofloxacin in d5w], Lisinopril, and Quinolones   Social History   Socioeconomic History   Marital status: Widowed    Spouse name: Not on file   Number of children: Not on file   Years of education: Not on file   Highest education level: Not on file  Occupational History   Not on file  Tobacco Use   Smoking status: Never   Smokeless tobacco: Never  Substance and Sexual Activity   Alcohol use: No   Drug use: No   Sexual activity: Not on file  Other Topics Concern   Not on file  Social History Narrative   Not on file   Social Determinants of Health   Financial Resource Strain: Not on file  Food Insecurity: Not on file  Transportation Needs: Not on file  Physical Activity: Not on file  Stress: Not on file  Social Connections: Not on file     Family History: The patient's family history includes Heart disease in his father and mother.  ROS:   Please see the history of present illness.    No DVT is been noted.  He is on chronic anticoagulation therapy without bleeding.  He has had bulla on his legs related to lower extremity edema and is now in the wound clinic.  All other systems reviewed and are negative.  EKGs/Labs/Other Studies Reviewed:    The following studies were reviewed today:  2D Doppler  echocardiogram September 2017: Study Conclusions   - Left ventricle: The cavity size was normal. Wall thickness was    increased in a pattern of mild LVH. Systolic function was normal.    The estimated ejection fraction was in the range of 55% to 60%.    Wall motion was normal; there were no regional wall motion    abnormalities.  - Aortic root: The aortic root was mildly dilated.  - Ascending aorta: The ascending aorta was mildly  dilated.  - Left atrium: The atrium was moderately dilated.  - Right atrium: The atrium was mildly dilated.   Impressions:   - Definity used; normal LV function; mild LVH; biatrial    enlargement; mild TR.   EKG:  EKG atrial fibrillation, low voltage, and no change when compared to prior.  Recent Labs: 03/02/2020: ALT 16; BUN 54; Creatinine 2.42; Hemoglobin 12.8; Platelets 196; Potassium 4.3; Sodium 139  Recent Lipid Panel No results found for: CHOL, TRIG, HDL, CHOLHDL, VLDL, LDLCALC, LDLDIRECT  Physical Exam:    VS:  BP 112/80   Pulse 75   Ht 5\' 11"  (1.803 m)   Wt (!) 301 lb (136.5 kg)   SpO2 96%   BMI 41.98 kg/m     Wt Readings from Last 3 Encounters:  08/08/20 (!) 301 lb (136.5 kg)  03/09/20 294 lb 11.2 oz (133.7 kg)  01/27/20 296 lb (134.3 kg)     GEN: Obese. No acute distress HEENT: Normal NECK: No JVD. LYMPHATICS: No lymphadenopathy CARDIAC: No murmur. RRR no gallop, or edema. VASCULAR:  Normal Pulses. No bruits. RESPIRATORY:  Clear to auscultation without rales, wheezing or rhonchi  ABDOMEN: Soft, non-tender, non-distended, No pulsatile mass, MUSCULOSKELETAL: No deformity  SKIN: Warm and dry NEUROLOGIC:  Alert and oriented x 3 PSYCHIATRIC:  Normal affect   ASSESSMENT:    1. Atrial fibrillation, persistent ([place])   2. Chronic diastolic heart failure ([place])   3. Chronic kidney disease, unspecified CKD stage   4. Essential hypertension   5. Other hyperlipidemia   6. Long-term use of high-risk medication   7. Stage 3b chronic  kidney disease ([place])    PLAN:    In order of problems listed above:  Control rate on digoxin and Toprol-XL 100 mg twice daily. Continue olmesartan and diuretic therapy.  Consider SGLT2 therapy if LVEF is still normal or abnormal..  2D Doppler echocardiogram will be performed.  BNP is also being obtained.  Consider doing an amyloid study as he has low voltage on EKG. Creatinine is in the 2-2.2 range consistent with CKD stage IIIb. Low normal blood pressure. Continue on no therapy at the patient's request. Continue Coumadin therapy with close follow-up. Coordinate with Dr. Joylene Grapes.  If LV function is down, we may consider much more aggressive diuretic therapy.  Also considering SGLT2 therapy.   Medication Adjustments/Labs and Tests Ordered: Current medicines are reviewed at length with the patient today.  Concerns regarding medicines are outlined above.  Orders Placed This Encounter  Procedures   EKG 12-Lead    No orders of the defined types were placed in this encounter.   There are no Patient Instructions on file for this visit.   Signed, Sinclair Grooms, MD  08/08/2020 12:05 PM    [place]

## 2020-08-06 ENCOUNTER — Other Ambulatory Visit: Payer: Self-pay | Admitting: Interventional Cardiology

## 2020-08-08 ENCOUNTER — Other Ambulatory Visit: Payer: Self-pay

## 2020-08-08 ENCOUNTER — Encounter: Payer: Self-pay | Admitting: Interventional Cardiology

## 2020-08-08 ENCOUNTER — Ambulatory Visit (INDEPENDENT_AMBULATORY_CARE_PROVIDER_SITE_OTHER): Payer: Medicare Other | Admitting: Interventional Cardiology

## 2020-08-08 ENCOUNTER — Encounter (HOSPITAL_BASED_OUTPATIENT_CLINIC_OR_DEPARTMENT_OTHER): Payer: Medicare Other | Admitting: Physician Assistant

## 2020-08-08 VITALS — BP 112/80 | HR 75 | Ht 71.0 in | Wt 301.0 lb

## 2020-08-08 DIAGNOSIS — N1832 Chronic kidney disease, stage 3b: Secondary | ICD-10-CM | POA: Diagnosis not present

## 2020-08-08 DIAGNOSIS — N189 Chronic kidney disease, unspecified: Secondary | ICD-10-CM

## 2020-08-08 DIAGNOSIS — L97929 Non-pressure chronic ulcer of unspecified part of left lower leg with unspecified severity: Secondary | ICD-10-CM | POA: Diagnosis not present

## 2020-08-08 DIAGNOSIS — N184 Chronic kidney disease, stage 4 (severe): Secondary | ICD-10-CM | POA: Diagnosis not present

## 2020-08-08 DIAGNOSIS — I504 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 failure: Secondary | ICD-10-CM | POA: Diagnosis not present

## 2020-08-08 DIAGNOSIS — I10 Essential (primary) hypertension: Secondary | ICD-10-CM | POA: Diagnosis not present

## 2020-08-08 DIAGNOSIS — I4819 Other persistent atrial fibrillation: Secondary | ICD-10-CM | POA: Diagnosis not present

## 2020-08-08 DIAGNOSIS — Z79899 Other long term (current) drug therapy: Secondary | ICD-10-CM

## 2020-08-08 DIAGNOSIS — I872 Venous insufficiency (chronic) (peripheral): Secondary | ICD-10-CM | POA: Diagnosis not present

## 2020-08-08 DIAGNOSIS — I890 Lymphedema, not elsewhere classified: Secondary | ICD-10-CM | POA: Diagnosis not present

## 2020-08-08 DIAGNOSIS — E7849 Other hyperlipidemia: Secondary | ICD-10-CM | POA: Diagnosis not present

## 2020-08-08 DIAGNOSIS — I5032 Chronic diastolic (congestive) heart failure: Secondary | ICD-10-CM

## 2020-08-08 DIAGNOSIS — L97919 Non-pressure chronic ulcer of unspecified part of right lower leg with unspecified severity: Secondary | ICD-10-CM | POA: Diagnosis not present

## 2020-08-08 DIAGNOSIS — Z7901 Long term (current) use of anticoagulants: Secondary | ICD-10-CM | POA: Diagnosis not present

## 2020-08-08 DIAGNOSIS — M1A9XX1 Chronic gout, unspecified, with tophus (tophi): Secondary | ICD-10-CM | POA: Diagnosis not present

## 2020-08-08 DIAGNOSIS — I130 Hypertensive heart and chronic kidney disease with heart failure and stage 1 through stage 4 chronic kidney disease, or unspecified chronic kidney disease: Secondary | ICD-10-CM | POA: Diagnosis not present

## 2020-08-08 NOTE — Patient Instructions (Signed)
Medication Instructions:  Your physician recommends that you continue on your current medications as directed. Please refer to the Current Medication list given to you today.  *If you need a refill on your cardiac medications before your next appointment, please call your pharmacy*   Lab Work: Pro BNP today  If you have labs (blood work) drawn today and your tests are completely normal, you will receive your results only by: [place] (if you have MyChart) OR A paper copy in the mail If you have any lab test that is abnormal or we need to change your treatment, we will call you to review the results.   Testing/Procedures: Your physician has requested that you have an echocardiogram. Echocardiography is a painless test that uses sound waves to create images of your heart. It provides your doctor with information about the size and shape of your heart and how well your heart's chambers and valves are working. This procedure takes approximately one hour. There are no restrictions for this procedure.   Follow-Up: At [facility], you and your health needs are our priority.  As part of our continuing mission to provide you with exceptional heart care, we have created designated Provider Care Teams.  These Care Teams include your primary Cardiologist (physician) and Advanced Practice Providers (APPs -  Physician Assistants and Nurse Practitioners) who all work together to provide you with the care you need, when you need it.  We recommend signing up for the patient portal called "MyChart".  Sign up information is provided on this After Visit Summary.  MyChart is used to connect with patients for Virtual Visits (Telemedicine).  Patients are able to view lab/test results, encounter notes, upcoming appointments, etc.  Non-urgent messages can be sent to your provider as well.   To learn more about what you can do with MyChart, go to [email or web address].    Your next appointment:   6  month(s)  The format for your next appointment:   In Person  Provider:   You may see Sinclair Grooms, MD or one of the following Advanced Practice Providers on your designated Care Team:   Cecilie Kicks, NP   Other Instructions

## 2020-08-09 LAB — PRO B NATRIURETIC PEPTIDE: NT-Pro BNP: 2302 pg/mL — ABNORMAL HIGH (ref 0–486)

## 2020-08-09 NOTE — Progress Notes (Signed)
RONI, SCOW ([id number]) Visit Report for 08/08/2020 SuperBill Details Patient Name: Date of Service: Ronalee Belts 08/08/2020 Medical Record Number: [id number] Patient Account Number: [id number] Date of Birth/Sex: Treating RN: 1945-02-12 (75 y.o. 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

## 2020-08-09 NOTE — Progress Notes (Signed)
LLOYDE, LUDLAM ([id number]) Visit Report for 08/08/2020 Arrival Information Details Patient Name: Date of Service: Harry Foster 08/08/2020 2:30 PM Medical Record Number: [id number] Patient Account Number: [id number] Date of Birth/Sex: Treating RN: [birth year] (75 y.o. Harry Foster, Harry Foster Primary Care Harry Foster: Harry Foster Other Clinician: Referring Harry Foster: Treating Harry Foster/Extender: Harry Foster in Treatment: 12 Visit Information History Since Last Visit Added or deleted any medications: No Patient Arrived: Ambulatory Any new allergies or adverse reactions: No Arrival Time: 14:38 Had a fall or experienced change in No Accompanied By: self activities of daily living that may affect Transfer Assistance: None risk of falls: Patient Identification Verified: Yes Signs or symptoms of abuse/neglect since last visito No Secondary Verification Process Completed: Yes Hospitalized since last visit: No Patient Requires Transmission-Based Precautions: No Implantable device outside of the clinic excluding No Patient Has Alerts: Yes cellular tissue based products placed in the center Patient Alerts: R ABI: 1.63 TBI: 1.36 since last visit: L ABI: 1.49 TBI: 1.34 Has Dressing in Place as Prescribed: Yes 05/2020 Has Compression in Place as Prescribed: Yes Pain Present Now: No Electronic Signature(s) Signed: 08/08/2020 5:35:12 PM By: Harry Foster Entered By: Harry Foster on 08/08/2020 16:28:36 -------------------------------------------------------------------------------- Compression Therapy Details Patient Name: Date of Service: Harry Foster 08/08/2020 2:30 PM Medical Record Number: [id number] Patient Account Number: [id number] Date of Birth/Sex: Treating RN: December 12, 1945 (75 y.o. Harry Foster Primary Care Harry Foster: Harry Foster Other Clinician: Referring Harry Foster: Treating Harry Foster/Extender: Harry Foster  in Treatment: 12 Compression Therapy Performed for Wound Assessment: Wound #7R Left,Distal,Medial Lower Leg Performed By: Clinician Harry Pilling, RN Compression Type: Three Layer Electronic Signature(s) Signed: 08/08/2020 5:35:12 PM By: Harry Foster Entered By: Harry Foster on 08/08/2020 16:48:26 -------------------------------------------------------------------------------- Encounter Discharge Information Details Patient Name: Date of Service: Harry Foster 08/08/2020 2:30 PM Medical Record Number: [id number] Patient Account Number: [id number] Date of Birth/Sex: Treating RN: [birth year] (76 y.o. Harry Foster Primary Care Harry Foster: Harry Foster Other Clinician: Referring Harry Foster: Treating Harry Foster/Extender: Harry Foster in Treatment: 12 Encounter Discharge Information Items Discharge Condition: Stable Ambulatory Status: Ambulatory Discharge Destination: Home Transportation: Private Auto Accompanied By: self Schedule Follow-up Appointment: Yes Clinical Summary of Care: Electronic Signature(s) Signed: 08/08/2020 5:35:12 PM By: Harry Foster Entered By: Harry Foster on 08/08/2020 16:49:07 -------------------------------------------------------------------------------- Patient/Caregiver Education Details Patient Name: Date of Service: Harry Foster 8/8/2022andnbsp2:30 PM Medical Record Number: [id number] Patient Account Number: [id number] Date of Birth/Gender: Treating RN: 07/08/1945 (75 y.o. Harry Foster Primary Care Physician: Harry Foster Other Clinician: Referring Physician: Treating Physician/Extender: Harry Foster in Treatment: 12 Education Assessment Education Provided To: Patient Education Topics Provided Wound/Skin Impairment: Handouts: Skin Care Do's and Dont's Methods: Explain/Verbal Responses: Reinforcements needed Electronic Signature(s) Signed: 08/08/2020 5:35:12 PM  By: Harry Foster Entered By: Harry Foster on 08/08/2020 16:48:46 -------------------------------------------------------------------------------- Wound Assessment Details Patient Name: Date of Service: Harry Foster. 08/08/2020 2:30 PM Medical Record Number: [id number] Patient Account Number: [id number] Date of Birth/Sex: Treating RN: 1945/08/17 (75 y.o. Harry Foster Primary Care Harry Foster: Harry Foster Other Clinician: Referring Harry Foster: Treating Harry Foster/Extender: Harry Foster in Treatment: 12 Wound Status Wound Number: 7R Primary Venous Leg Ulcer Etiology: Wound Location: Left, Distal, Medial Lower Leg Wound Open Wounding Event: Blister Status: Date Acquired: 07/12/2020 Comorbid Cataracts, Arrhythmia, Congestive Heart Failure, Hypertension, Foster Of Treatment:  3 History: Peripheral Venous Disease, Gout, Osteoarthritis Clustered Wound: No Wound Measurements Length: (cm) 0.1 Width: (cm) 0.1 Depth: (cm) 0.1 Area: (cm) 0.008 Volume: (cm) 0.001 % Reduction in Area: 99.1% % Reduction in Volume: 98.9% Epithelialization: Large (67-100%) Tunneling: No Undermining: No Wound Description Classification: Full Thickness Without Exposed Support Structures Wound Margin: Flat and Intact Exudate Amount: Small Exudate Type: Serosanguineous Exudate Color: red, brown Foul Odor After Cleansing: No Slough/Fibrino No Wound Bed Granulation Amount: None Present (0%) Exposed Structure Necrotic Amount: None Present (0%) Fascia Exposed: No Fat Layer (Subcutaneous Tissue) Exposed: Yes Tendon Exposed: No Muscle Exposed: No Joint Exposed: No Bone Exposed: No Treatment Notes Wound #7R (Lower Leg) Wound Laterality: Left, Medial, Distal Cleanser Peri-Wound Care Triamcinolone 15 (g) Discharge Instruction: Use triamcinolone 15 (g) as directed Sween Lotion (Moisturizing lotion) Discharge Instruction: Apply moisturizing lotion as  directed Topical Primary Dressing Hydrofera Blue Ready Foam, 4x5 in Discharge Instruction: Apply to wound bed as instructed Secondary Dressing Woven Gauze Sponge, Non-Sterile 4x4 in Discharge Instruction: Apply over primary dressing as directed. Secured With Compression Wrap ThreePress (3 layer compression wrap) Discharge Instruction: Apply three layer compression as directed. Compression Stockings Add-Ons Electronic Signature(s) Signed: 08/08/2020 5:35:12 PM By: Harry Foster Entered By: Harry Foster on 08/08/2020 16:47:48 -------------------------------------------------------------------------------- Vitals Details Patient Name: Date of Service: Harry Foster 08/08/2020 2:30 PM Medical Record Number: [id number] Patient Account Number: [id number] Date of Birth/Sex: Treating RN: 08/19/1945 (75 y.o. Harry Foster, Harry Foster Primary Care Bushra Denman: Harry Foster Other Clinician: Referring Estella Malatesta: Treating Giorgi Debruin/Extender: Harry Foster in Treatment: 12 Vital Signs Time Taken: 14:38 Temperature (F): 97.9 Height (in): 71 Pulse (bpm): 76 Weight (lbs): 295 Respiratory Rate (breaths/min): 20 Body Mass Index (BMI): 41.1 Blood Pressure (mmHg): 99/65 Reference Range: 80 - 120 mg / dl Electronic Signature(s) Signed: 08/08/2020 5:35:12 PM By: Harry Foster Entered By: Harry Foster on 08/08/2020 16:47:20

## 2020-08-10 ENCOUNTER — Telehealth: Payer: Self-pay | Admitting: *Deleted

## 2020-08-10 DIAGNOSIS — I5032 Chronic diastolic (congestive) heart failure: Secondary | ICD-10-CM

## 2020-08-10 MED ORDER — FUROSEMIDE 40 MG PO TABS: 120.0000 mg | ORAL_TABLET | Freq: Every day | ORAL | 3 refills | Status: DC

## 2020-08-10 NOTE — Telephone Encounter (Signed)
Spoke with pt and went over results and recommendations per Dr. Tamala Bowie.  Pt agreeable to plan.  He will get labs while he is here on 8/18 for Coumadin.

## 2020-08-10 NOTE — Telephone Encounter (Signed)
-----   Message from Belva Crome, MD sent at 08/10/2020  8:08 AM EDT ----- Let the patient know please increase furosemide to 120 mg once per day.  Bmet in 1 week. A copy will be sent to Josetta Huddle, MD

## 2020-08-15 ENCOUNTER — Other Ambulatory Visit: Payer: Self-pay

## 2020-08-15 ENCOUNTER — Encounter (HOSPITAL_BASED_OUTPATIENT_CLINIC_OR_DEPARTMENT_OTHER): Payer: Medicare Other | Admitting: Internal Medicine

## 2020-08-15 DIAGNOSIS — L97829 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ity: Secondary | ICD-10-CM

## 2020-08-15 DIAGNOSIS — N184 Chronic kidney disease, stage 4 (severe): Secondary | ICD-10-CM

## 2020-08-15 DIAGNOSIS — I130 Hypertensive heart and chronic kidney disease with heart failure and stage 1 through stage 4 chronic kidney disease, or unspecified chronic kidney disease: Secondary | ICD-10-CM | POA: Diagnosis not present

## 2020-08-15 DIAGNOSIS — I504 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 failure: Secondary | ICD-10-CM

## 2020-08-15 DIAGNOSIS — I890 Lymphedema, not elsewhere classified: Secondary | ICD-10-CM | POA: Diagnosis not present

## 2020-08-15 DIAGNOSIS — L97929 Non-pressure chronic ulcer of unspecified part of left lower leg with unspecified severity: Secondary | ICD-10-CM | POA: Diagnosis not present

## 2020-08-15 DIAGNOSIS — I872 Venous insufficiency (chronic) (peripheral): Secondary | ICD-10-CM

## 2020-08-15 DIAGNOSIS — M1A9XX1 Chronic gout, unspecified, with tophus (tophi): Secondary | ICD-10-CM | POA: Diagnosis not present

## 2020-08-15 DIAGNOSIS — Z7901 Long term (current) use of anticoagulants: Secondary | ICD-10-CM | POA: Diagnosis not present

## 2020-08-15 DIAGNOSIS — L97919 Non-pressure chronic ulcer of unspecified part of right lower leg with unspecified severity: Secondary | ICD-10-CM | POA: Diagnosis not present

## 2020-08-15 DIAGNOSIS — I4819 Other persistent atrial fibrillation: Secondary | ICD-10-CM | POA: Diagnosis not present

## 2020-08-16 NOTE — Progress Notes (Signed)
Harry Foster ([id number]) Visit Report for 08/15/2020 Arrival Information Details Patient Name: Date of Service: Harry Foster 08/15/2020 2:30 PM Medical Record Number: [id number] Patient Account Number: [id number] Date of Birth/Sex: Treating RN: 02-Jan-1945 (75 y.o. [name] Primary Care [name]: [name] Other Clinician: Referring [name]: Treating [name]/Extender: [name] in Treatment: 13 Visit Information History Since Last Visit Added or deleted any medications: No Patient Arrived: Ambulatory Any new allergies or adverse reactions: No Arrival Time: 14:35 Had a fall or experienced change in No Accompanied By: self activities of daily living that may affect Transfer Assistance: None risk of falls: Patient Identification Verified: Yes Signs or symptoms of abuse/neglect since last visito No Secondary Verification Process Completed: Yes Hospitalized since last visit: No Patient Requires Transmission-Based Precautions: No Implantable device outside of the clinic excluding No Patient Has Alerts: Yes cellular tissue based products placed in the center Patient Alerts: R ABI: 1.63 TBI: 1.36 since last visit: L ABI: 1.49 TBI: 1.34 Has Dressing in Place as Prescribed: Yes 05/2020 Pain Present Now: No Electronic Signature(s) Signed: 08/16/2020 11:32:43 AM By: [name] Entered By: [name] on 08/15/2020 14:35:45 -------------------------------------------------------------------------------- Clinic Level of Care Assessment Details Patient Name: Date of Service: Harry Foster 08/15/2020 2:30 PM Medical Record Number: [id number] Patient Account Number: [id number] Date of Birth/Sex: Treating RN: 05-Feb-1945 (75 y.o. [name] Primary Care [place] Year[place]: [name] Other Clinician: Referring [name]: Treating [name]/Extender: [name] in Treatment:  13 Clinic Level of Care Assessment Items TOOL 4 Quantity Score X- 1 0 Use when only an EandM is performed on FOLLOW-UP visit ASSESSMENTS - Nursing Assessment / Reassessment X- 1 10 Reassessment of Co-morbidities (includes updates in patient status) X- 1 5 Reassessment of Adherence to Treatment Plan ASSESSMENTS - Wound and Skin A ssessment / Reassessment X - Simple Wound Assessment / Reassessment - one wound 1 5 []  - 0 Complex Wound Assessment / Reassessment - multiple wounds []  - 0 Dermatologic / Skin Assessment (not related to wound area) ASSESSMENTS - Focused Assessment []  - 0 Circumferential Edema Measurements - multi extremities []  - 0 Nutritional Assessment / Counseling / Intervention []  - 0 Lower Extremity Assessment (monofilament, tuning fork, pulses) []  - 0 Peripheral Arterial Disease Assessment (using hand held doppler) ASSESSMENTS - Ostomy and/or Continence Assessment and Care []  - 0 Incontinence Assessment and Management []  - 0 Ostomy Care Assessment and Management (repouching, etc.) PROCESS - Coordination of Care []  - 0 Simple Patient / Family Education for ongoing care X- 1 20 Complex (extensive) Patient / Family Education for ongoing care []  - 0 Staff obtains Programmer, systems, Records, T Results / Process Orders est []  - 0 Staff telephones HHA, Nursing Homes / Clarify orders / etc []  - 0 Routine Transfer to another Facility (non-emergent condition) []  - 0 Routine Hospital Admission (non-emergent condition) []  - 0 New Admissions / Biomedical engineer / Ordering NPWT Apligraf, etc. , []  - 0 Emergency Hospital Admission (emergent condition) []  - 0 Simple Discharge Coordination []  - 0 Complex (extensive) Discharge Coordination PROCESS - Special Needs []  - 0 Pediatric / Minor Patient Management []  - 0 Isolation Patient Management []  - 0 Hearing / Language / Visual special needs []  - 0 Assessment of Community assistance (transportation, D/C planning,  etc.) []  - 0 Additional assistance / Altered mentation []  - 0 Support Surface(s) Assessment (bed, cushion, seat, etc.) INTERVENTIONS - Wound Cleansing / Measurement X - Simple  Wound Cleansing - one wound 1 5 []  - 0 Complex Wound Cleansing - multiple wounds X- 1 5 Wound Imaging (photographs - any number of wounds) []  - 0 Wound Tracing (instead of photographs) []  - 0 Simple Wound Measurement - one wound []  - 0 Complex Wound Measurement - multiple wounds INTERVENTIONS - Wound Dressings []  - 0 Small Wound Dressing one or multiple wounds []  - 0 Medium Wound Dressing one or multiple wounds []  - 0 Large Wound Dressing one or multiple wounds []  - 0 Application of Medications - topical []  - 0 Application of Medications - injection INTERVENTIONS - Miscellaneous []  - 0 External ear exam []  - 0 Specimen Collection (cultures, biopsies, blood, body fluids, etc.) []  - 0 Specimen(s) / Culture(s) sent or taken to Lab for analysis []  - 0 Patient Transfer (multiple staff / Civil Service fast streamer / Similar devices) []  - 0 Simple Staple / Suture removal (25 or less) []  - 0 Complex Staple / Suture removal (26 or more) []  - 0 Hypo / Hyperglycemic Management (close monitor of Blood Glucose) []  - 0 Ankle / Brachial Index (ABI) - do not check if billed separately X- 1 5 Vital Signs Has the patient been seen at the hospital within the last three years: Yes Total Score: 55 Level Of Care: New/Established - Level 2 Electronic Signature(s) Signed: 08/15/2020 5:36:52 PM By: Lorrin Jackson Entered By: Lorrin Jackson on 08/15/2020 15:48:58 -------------------------------------------------------------------------------- Encounter Discharge Information Details Patient Name: Date of Service: Harry Foster. 08/15/2020 2:30 PM Medical Record Number: [id number] Patient Account Number: [id number] Date of Birth/Sex: Treating RN: May 23, 1945 (75 y.o. [name] Primary Care Khyson Sebesta: [name]  Other Clinician: Referring [name]: Treating [name]/Extender: [name] in Treatment: 13 Encounter Discharge Information Items Discharge Condition: Stable Ambulatory Status: Ambulatory Discharge Destination: Home Transportation: Private Auto Accompanied By: self Schedule Follow-up Appointment: No Clinical Summary of Care: Notes farrow wrap applied to left leg. Electronic Signature(s) Signed: 08/15/2020 5:38:02 PM By: Deon Pilling Entered By: Deon Pilling on 08/15/2020 17:29:57 -------------------------------------------------------------------------------- Lower Extremity Assessment Details Patient Name: Date of Service: Harry Foster 08/15/2020 2:30 PM Medical Record Number: [id number] Patient Account Number: [id number] Date of Birth/Sex: Treating RN: [birth year] (75 y.o. [name] Primary Care [place]: [name] Other Clinician: Referring Marks Scalera: Treating Jammy Plotkin/Extender: [name] in Treatment: 13 Edema Assessment Assessed: Shirlyn Goltz: Yes] Patrice Paradise: No] Edema: [Left: Ye] [Right: s] Calf Left: Right: Point of Measurement: From Medial Instep 40 cm Ankle Left: Right: Point of Measurement: From Medial Instep 23 cm Vascular Assessment Pulses: Dorsalis Pedis Palpable: [Left:Yes] Electronic Signature(s) Signed: 08/15/2020 5:38:02 PM By: Deon Pilling Entered By: Deon Pilling on 08/15/2020 14:39:06 -------------------------------------------------------------------------------- Multi Wound Chart Details Patient Name: Date of Service: Harry Foster. 08/15/2020 2:30 PM Medical Record Number: [id number] Patient Account Number: [id number] Date of Birth/Sex: Treating RN: [birth year] (75 y.o. [name] Primary Care Dorrie Cocuzza: [name] Other Clinician: Referring Roald Lukacs: Treating Greyson Peavy/Extender: [name] in Treatment:  13 Vital Signs Height(in): 71 Pulse(bpm): 46 Weight(lbs): 295 Blood Pressure(mmHg): 96/63 Body Mass Index(BMI): 41 Temperature(F): 98.3 Respiratory Rate(breaths/min): 20 Photos: [N/A:N/A] Left, Distal, Medial Lower Leg N/A N/A Wound Location: Blister N/A N/A Wounding Event: Venous Leg Ulcer N/A N/A Primary Etiology: Cataracts, Arrhythmia, Congestive N/A N/A Comorbid History: Heart Failure, Hypertension, Peripheral Venous Disease, Gout, Osteoarthritis 07/12/2020 N/A N/A Date Acquired: 4 N/A N/A Weeks of Treatment: Open N/A N/A Wound Status:  Yes N/A N/A Wound Recurrence: 0x0x0 N/A N/A Measurements L x W x D (cm) 0 N/A N/A A (cm) : rea 0 N/A N/A Volume (cm) : 100.00% N/A N/A % Reduction in Area: 100.00% N/A N/A % Reduction in Volume: Full Thickness Without Exposed N/A N/A Classification: Support Structures Small N/A N/A Exudate Amount: Serosanguineous N/A N/A Exudate Type: red, brown N/A N/A Exudate Color: Flat and Intact N/A N/A Wound Margin: None Present (0%) N/A N/A Granulation Amount: None Present (0%) N/A N/A Necrotic Amount: Fat Layer (Subcutaneous Tissue): Yes N/A N/A Exposed Structures: Fascia: No Tendon: No Muscle: No Joint: No Bone: No Large (67-100%) N/A N/A Epithelialization: Treatment Notes Electronic Signature(s) Signed: 08/16/2020 9:35:55 AM By: Kalman Shan DO Signed: 08/16/2020 5:08:22 PM By: Lorrin Jackson Entered By: Kalman Shan on 08/16/2020 09:25:56 -------------------------------------------------------------------------------- Multi-Disciplinary Care Plan Details Patient Name: Date of Service: Harry Foster. 08/15/2020 2:30 PM Medical Record Number: [id number] Patient Account Number: [id number] Date of Birth/Sex: Treating RN: April 13, 1945 (75 y.o. [name] Primary Care Lennyn Bellanca: [name] Other Clinician: Referring Cathan Gearin: Treating Fowler Antos/Extender: [name] in Treatment: Rolfe reviewed with physician Active Inactive Electronic Signature(s) Signed: 08/15/2020 5:36:52 PM By: Lorrin Jackson Entered By: Lorrin Jackson on 08/15/2020 15:47:41 -------------------------------------------------------------------------------- Pain Assessment Details Patient Name: Date of Service: Harry Foster 08/15/2020 2:30 PM Medical Record Number: [id number] Patient Account Number: [id number] Date of Birth/Sex: Treating RN: 03/16/1945 (75 y.o. [name] Primary Care Leisha Trinkle: [name] Other Clinician: Referring Joziyah Roblero: Treating Corrina Steffensen/Extender: [name] in Treatment: 13 Active Problems Location of Pain Severity and Description of Pain Patient Has Paino No Site Locations Pain Management and Medication Current Pain Management: Electronic Signature(s) Signed: 08/15/2020 5:36:52 PM By: Lorrin Jackson Signed: 08/16/2020 11:32:43 AM By: [name] Entered By: [name] on 08/15/2020 14:36:16 -------------------------------------------------------------------------------- Patient/Caregiver Education Details Patient Name: Date of Service: Harry Foster 8/15/2022andnbsp2:30 PM Medical Record Number: [id number] Patient Account Number: [id number] Date of Birth/Gender: Treating RN: 1945-10-14 (75 y.o. [name] Primary Care Physician: [name] Other Clinician: Referring Physician: Treating Physician/Extender: [name] in Treatment: 13 Education Assessment Education Provided To: Patient Education Topics Provided Notes Healed, discharge instructions given. Electronic Signature(s) Signed: 08/15/2020 5:36:52 PM By: Lorrin Jackson Entered By: Lorrin Jackson on 08/15/2020 15:48:03 -------------------------------------------------------------------------------- Wound Assessment  Details Patient Name: Date of Service: Harry Foster 08/15/2020 2:30 PM Medical Record Number: [id number] Patient Account Number: [id number] Date of Birth/Sex: Treating RN: [birth year] (74 y.o. 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: Wound Location: Left, Distal, Medial Lower Leg Wound Open Wounding Event: Blister Status: Date Acquired: 07/12/2020 Comorbid Cataracts, Arrhythmia, Congestive Heart Failure, Hypertension, Weeks Of Treatment: 4 History: Peripheral Venous Disease, Gout, Osteoarthritis Clustered Wound: No Photos Wound Measurements Length: (cm) Width: (cm) Depth: (cm) Area: (cm) Volume: (cm) 0 % Reduction in Area: 100% 0 % Reduction in Volume: 100% 0 Epithelialization: Large (67-100%) 0 0 Wound Description Classification: Full Thickness Without Exposed Support Structures Wound Margin: Flat and Intact Exudate Amount: Small Exudate Type: Serosanguineous Exudate Color: red, brown Foul Odor After Cleansing: No Slough/Fibrino No Wound Bed Granulation Amount: None Present (0%) Exposed Structure Necrotic Amount: None Present (0%) Fascia Exposed: No Fat Layer (Subcutaneous Tissue) Exposed: Yes Tendon  Exposed: No Muscle Exposed: No Joint Exposed: No Bone Exposed: No Electronic Signature(s) Signed: 08/15/2020 5:36:52 PM By: Lorrin Jackson Signed: 08/16/2020 11:32:43 AM By: [name] Entered By: [name] on 08/15/2020 14:38:45 -------------------------------------------------------------------------------- [place] Details Patient Name: Date of Service: Harry Leventhal G. 08/15/2020 2:30 PM Medical Record Number: [id number] Patient Account Number: [id number] Date of Birth/Sex: Treating RN: 11/07/1945 (75 y.o. [name] Primary Care Jhon Mallozzi: [name] Other Clinician: Referring Georgiann Neider: Treating Raelie Lohr/Extender: [name] in Treatment: 13 Vital Signs Time Taken: 02:35 Temperature (F): 98.3 Height (in): 71 Pulse (bpm): 77 Weight (lbs): 295 Respiratory Rate (breaths/min): 20 Body Mass Index (BMI): 41.1 Blood Pressure (mmHg): 96/63 Reference Range: 80 - 120 mg / dl Electronic Signature(s) Signed: 08/16/2020 11:32:43 AM By: [name] Entered By: [name] on 08/15/2020 14:36:08

## 2020-08-16 NOTE — Progress Notes (Signed)
Harry Foster ([id number]) Visit Report for 08/15/2020 Chief Complaint Document Details Patient Name: Date of Service: Harry Foster 08/15/2020 2:30 PM Medical Record Number: [id number] Patient Account Number: [id number] Date of Birth/Sex: Treating RN: March 24, 1945 (75 y.o. [name] Primary Care Provider: [name] Other Clinician: Referring Provider: Treating Provider/Extender: [name] in Treatment: 13 Information Obtained from: Patient Chief Complaint Left lower extremity skin breakdown Electronic Signature(s) Signed: 08/16/2020 9:35:55 AM By: Kalman Shan DO Entered By: Kalman Shan on 08/16/2020 09:26:04 -------------------------------------------------------------------------------- HPI Details Patient Name: Date of Service: Harry Foster. 08/15/2020 2:30 PM Medical Record Number: [id number] Patient Account Number: [id number] Date of Birth/Sex: Treating RN: 1945/01/13 (75 y.o. [name] Primary Care Provider: [name] Other Clinician: Referring Provider: Treating Provider/Extender: [name] in Treatment: 13 History of Present Illness HPI Description: Harry Foster is a 75 year old male with a past medical history of persistent atrial fibrillation on Coumadin, hypertension, gout, CHF And venous insufficiency that presents to the clinic for bilateral lower extremity wounds. He states he has a history of blistering to his legs that eventually pop and heal. He states that in the past 6 weeks he has been unable to heal his wounds. He keeps them wrapped however they soak through quickly and has to change the wraps frequently. 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 He sleeps in a recliner at night and keeps his legs down due to increased pain when they are elevated. He states this symptom started a few weeks ago. He  denies signs of infection. 5/23; patient presents for 1 week follow-up. He had Kerlix/Coban wrap placed at last clinic visit and changed again last week due to increased drainage. He states that there has been an improvement in swelling and drainage over the past week. He is going to have arterial studies done tomorrow. He denies signs of infection. 5/31; patient presents for 1 week follow-up. He has been doing well with Kerlix/Coban wrap. He denies signs of infection. 6/7; patient presents for 1 week follow-up. He has been using Kerlix/Coban wraps bilaterally with collagen underneath. He has tolerated these well. He denies signs of infection. The right leg no longer has wounds. He has his [name] wraps today 6/14; the patient is effectively healed on the left leg. He already is wearing his [name] wraps on the right leg he has 20/30 stockings below this. He has a stocking for the left leg. He says he is going to go to elastic therapy for 20/30 stockings to put under the Farrow wraps 6/30; patient was discharged from the clinic 2 weeks ago for closed wounds. He reports using Farrow wraps bilaterally. He says he noticed in the past week he was developing new wounds. He denies signs of infection. 7/12; patient presents for follow-up. He has been tolerating the Kerlix/Coban well. When the wrap was taken off today he noticed a blister and this opened in the clinic. He denies signs of infection. 7/21. The patient arrives healed. Previously followed by Dr. Heber [place]. He has severe chronic venous insufficiency. The blister he noted last week on the left anterior leg is closed. He has Farrow wrap stockings he is familiar with these. I cannot really determine from him today whether he was actually using these when these wounds formed. Readmission 8/1 Patient presents again for skin breakdown to his left lower extremity. He was discharged less  than 2 weeks ago for a wound to his left lower extremity. He states  that on discharge the following day he had a blister that opened and popped. Its been healing since. He has been tightly wrapping his leg from the ankle up and placing a Farrow wrap over this. He currently denies signs of infection. 8/15; patient presents for 2-week follow-up. He states that his diuretic was increased recently by his cardiologist. He reports improvement to his lower extremity swelling. He states that the wounds are healed. Electronic Signature(s) Signed: 08/16/2020 9:35:55 AM By: Kalman Shan DO Entered By: Kalman Shan on 08/16/2020 09:27:28 -------------------------------------------------------------------------------- Physical Exam Details Patient Name: Date of Service: Harry Leventhal G. 08/15/2020 2:30 PM Medical Record Number: [id number] Patient Account Number: [id number] Date of Birth/Sex: Treating RN: 01-17-1945 (75 y.o. [name] Primary Care Provider: [name] Other Clinician: Referring Provider: Treating Provider/Extender: [name] in Treatment: 13 Constitutional respirations regular, non-labored and within target range for patient.. Cardiovascular 2+ dorsalis pedis/posterior tibialis pulses. Psychiatric pleasant and cooperative. Notes Left lower extremity: No open wounds. Good edema control. No signs of infection. Electronic Signature(s) Signed: 08/16/2020 9:35:55 AM By: Kalman Shan DO Entered By: Kalman Shan on 08/16/2020 09:30:42 -------------------------------------------------------------------------------- Physician Orders Details Patient Name: Date of Service: Harry Leventhal G. 08/15/2020 2:30 PM Medical Record Number: [id number] Patient Account Number: [id number] Date of Birth/Sex: Treating RN: 1945/08/12 (75 y.o. 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 Edema Control - Lymphedema / SCD / Other Compression stocking or Garment 20-30 mm/Hg pressure to: - Apply CircAid in morning and remove at bedtime. Non Wound Condition pply the following to affected area as directed: - Apply moisturizing lotion to healed area. A Electronic Signature(s) Signed: 08/16/2020 9:35:55 AM By: Kalman Shan DO Previous Signature: 08/15/2020 5:36:52 PM Version By: Lorrin Jackson Entered By: Kalman Shan on 08/16/2020 09:31:30 -------------------------------------------------------------------------------- Problem List Details Patient Name: Date of Service: Harry Foster 08/15/2020 2:30 PM Medical Record Number: [id number] Patient Account Number: [id number] Date of Birth/Sex: Treating RN: 21-May-1945 (75 y.o. [name] Primary Care Provider: [name] Other Clinician: Referring Provider: Treating Provider/Extender: [name] in Treatment: 13 Active Problems ICD-10 Encounter Code Description Active Date MDM Diagnosis L97.829 Non-pressure chronic ulcer of other part of left lower leg with unspecified 05/16/2020 No Yes severity I87.2 Venous insufficiency (chronic) (peripheral) 05/16/2020 No Yes I48.19 Other persistent atrial fibrillation 05/16/2020 No Yes Z79.01 Long term  (current) use of anticoagulants 05/16/2020 No Yes I50.40 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 05/16/2020 No Yes failure M1A.9XX1 Chronic gout, unspecified, with tophus (tophi) 05/16/2020 No Yes N18.4 Chronic kidney disease, stage 4 (severe) 05/16/2020 No Yes Inactive Problems Resolved Problems ICD-10 Code Description Active Date Resolved Date L97.919 Non-pressure chronic ulcer of unspecified part of right lower leg with unspecified 05/16/2020 05/16/2020 severity Electronic Signature(s) Signed: 08/16/2020 9:35:55 AM  By: Kalman Shan DO Previous Signature: 08/15/2020 3:24:21 PM Version By: Lorrin Jackson Entered By: Kalman Shan on 08/16/2020 09:25:50 -------------------------------------------------------------------------------- Progress Note Details Patient Name: Date of Service: Harry Leventhal G. 08/15/2020 2:30 PM Medical Record Number: [id number] Patient Account Number: [id number] Date of Birth/Sex: Treating RN: January 17, 1945 (75 y.o. [name] Primary Care Provider: [name] Other Clinician: Referring Provider: Treating Provider/Extender: [name] in Treatment: 13 Subjective Chief Complaint Information obtained from Patient Left lower extremity skin breakdown History of Present Illness (HPI) Mr. Damek Ende Foster is a 75 year old male with a past medical history of persistent atrial fibrillation on Coumadin, hypertension, gout, CHF And venous insufficiency that presents to the clinic for bilateral lower extremity wounds. He states he has a history of blistering to his legs that eventually pop and heal. He states that in the past 6 weeks he has been unable to heal his wounds. He keeps them wrapped however they soak through quickly and has to change the wraps frequently. 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 He sleeps in a recliner at night and keeps  his legs down due to increased pain when they are elevated. He states this symptom started a few weeks ago. He denies signs of infection. 5/23; patient presents for 1 week follow-up. He had Kerlix/Coban wrap placed at last clinic visit and changed again last week due to increased drainage. He states that there has been an improvement in swelling and drainage over the past week. He is going to have arterial studies done tomorrow. He denies signs of infection. 5/31; patient presents for 1 week follow-up. He has been doing well with Kerlix/Coban wrap. He denies signs of infection. 6/7; patient presents for 1 week follow-up. He has been using Kerlix/Coban wraps bilaterally with collagen underneath. He has tolerated these well. He denies signs of infection. The right leg no longer has wounds. He has his [name] wraps today 6/14; the patient is effectively healed on the left leg. He already is wearing his [name] wraps on the right leg he has 20/30 stockings below this. He has a stocking for the left leg. He says he is going to go to elastic therapy for 20/30 stockings to put under the Farrow wraps 6/30; patient was discharged from the clinic 2 weeks ago for closed wounds. He reports using Farrow wraps bilaterally. He says he noticed in the past week he was developing new wounds. He denies signs of infection. 7/12; patient presents for follow-up. He has been tolerating the Kerlix/Coban well. When the wrap was taken off today he noticed a blister and this opened in the clinic. He denies signs of infection. 7/21. The patient arrives healed. Previously followed by Dr. Heber [place]. He has severe chronic venous insufficiency. The blister he noted last week on the left anterior leg is closed. He has Farrow wrap stockings he is familiar with these. I cannot really determine from him today whether he was actually using these when these wounds formed. Readmission 8/1 Patient presents again for skin breakdown to his left  lower extremity. He was discharged less than 2 weeks ago for a wound to his left lower extremity. He states that on discharge the following day he had a blister that opened and popped. Its been healing since. He has been tightly wrapping his leg from the ankle up and placing a Farrow wrap over this. He currently denies signs of infection. 8/15; patient presents for  2-week follow-up. He states that his diuretic was increased recently by his cardiologist. He reports improvement to his lower extremity swelling. He states that the wounds are healed. Patient History Information obtained from Patient. Family History Cancer - Mother,Siblings, Heart Disease - Father, No family history of Diabetes, Hereditary Spherocytosis, Hypertension, Kidney Disease, Lung Disease, Seizures, Stroke, Thyroid Problems, Tuberculosis. Social History Never smoker, Marital Status - Widowed, Alcohol Use - Never, Drug Use - No History, Caffeine Use - Daily - coffee. Medical History Eyes Patient has history of Cataracts - mild Denies history of Glaucoma, Optic Neuritis Ear/Nose/Mouth/Throat Denies history of Chronic sinus problems/congestion, Middle ear problems Cardiovascular Patient has history of Arrhythmia - afib, Congestive Heart Failure, Hypertension, Peripheral Venous Disease Endocrine Denies history of Type I Diabetes, Type II Diabetes Genitourinary Denies history of End Stage Renal Disease Integumentary (Skin) Denies history of History of Burn Musculoskeletal Patient has history of Gout, Osteoarthritis Denies history of Rheumatoid Arthritis, Osteomyelitis Oncologic Denies history of Received Chemotherapy, Received Radiation Psychiatric Denies history of Anorexia/bulimia, Confinement Anxiety Hospitalization/Surgery History - bil knee replacements. Medical A Surgical History Notes nd Constitutional Symptoms (General Health) morbid obesity Genitourinary CKD stage  3 Objective Constitutional respirations regular, non-labored and within target range for patient.. Vitals Time Taken: 2:35 AM, Height: 71 in, Weight: 295 lbs, BMI: 41.1, Temperature: 98.3 F, Pulse: 77 bpm, Respiratory Rate: 20 breaths/min, Blood Pressure: 96/63 mmHg. Cardiovascular 2+ dorsalis pedis/posterior tibialis pulses. Psychiatric pleasant and cooperative. General Notes: Left lower extremity: No open wounds. Good edema control. No signs of infection. Integumentary (Hair, Skin) Wound #7R status is Open. Original cause of wound was Blister. The date acquired was: 07/12/2020. The wound has been in treatment 4 weeks. The wound is located on the Left,Distal,Medial Lower Leg. The wound measures 0cm length x 0cm width x 0cm depth; 0cm^2 area and 0cm^3 volume. There is Fat Layer (Subcutaneous Tissue) exposed. There is a small amount of serosanguineous drainage noted. The wound margin is flat and intact. There is no granulation within the wound bed. There is no necrotic tissue within the wound bed. 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 He has had good edema control with our compression wraps. He has his Farrow wrap today and we will place this in office. His diuretic was increased 1 week ago and I am hopeful this will help with preventing blister formation and skin breakdown along with his compression Velcro wrap. He can follow-up as needed Plan Discharge From [facility] Services: Discharge from [place] - Call if any further issues. Edema Control - Lymphedema / SCD /  Other: Compression stocking or Garment 20-30 mm/Hg pressure to: - Apply CircAid in morning and remove at bedtime. Non Wound Condition: Apply the following to affected area as directed: - Apply moisturizing lotion to healed area. 1. Discharge patient due to closed wounds 2. Follow-up as needed 3. Use Farrow wraps daily Electronic Signature(s) Signed: 08/16/2020 9:35:55 AM By: Kalman Shan DO Entered By: Kalman Shan on 08/16/2020 09:34:40 -------------------------------------------------------------------------------- HxROS Details Patient Name: Date of Service: Harry Leventhal G. 08/15/2020 2:30 PM Medical Record Number: [id number] Patient Account Number: [id number] Date of Birth/Sex: Treating RN: Sep 29, 1945 (75 y.o. [name] Primary Care Provider:  [name] Other Clinician: Referring Provider: Treating Provider/Extender: [name] in Treatment: 13 Information Obtained From Patient Constitutional Symptoms (General Health) Medical History: Past Medical History Notes: morbid obesity Eyes Medical History: Positive for: Cataracts - mild Negative for: Glaucoma; Optic Neuritis Ear/Nose/Mouth/Throat Medical History: Negative for: Chronic sinus problems/congestion; Middle ear problems Cardiovascular Medical History: Positive for: Arrhythmia - afib; Congestive Heart Failure; Hypertension; Peripheral Venous Disease Endocrine Medical History: Negative for: Type I Diabetes; Type II Diabetes Genitourinary Medical History: Negative for: End Stage Renal Disease Past Medical History Notes: CKD stage 3 Integumentary (Skin) Medical History: Negative for: History of Burn Musculoskeletal Medical History: Positive for: Gout; Osteoarthritis Negative for: Rheumatoid Arthritis; Osteomyelitis Oncologic Medical History: Negative for: Received Chemotherapy; Received Radiation Psychiatric Medical History: Negative for:  Anorexia/bulimia; Confinement Anxiety HBO Extended History Items Eyes: Cataracts Immunizations Pneumococcal Vaccine: Received Pneumococcal Vaccination: Yes Received Pneumococcal Vaccination On or After 60th Birthday: No Implantable Devices No devices added Hospitalization / Surgery History Type of Hospitalization/Surgery bil knee replacements Family and Social History Cancer: Yes - Mother,Siblings; Diabetes: No; Heart Disease: Yes - Father; Hereditary Spherocytosis: No; Hypertension: No; Kidney Disease: No; Lung Disease: No; Seizures: No; Stroke: No; Thyroid Problems: No; Tuberculosis: No; Never smoker; Marital Status - Widowed; Alcohol Use: Never; Drug Use: No History; Caffeine Use: Daily - coffee; Financial Concerns: No; Food, Clothing or Shelter Needs: No; Support System Lacking: No; Transportation Concerns: No Electronic Signature(s) Signed: 08/16/2020 9:35:55 AM By: Kalman Shan DO Signed: 08/16/2020 5:08:22 PM By: Lorrin Jackson Entered By: Kalman Shan on 08/16/2020 09:27:36 -------------------------------------------------------------------------------- SuperBill Details Patient Name: Date of Service: Harry Foster 08/15/2020 Medical Record Number: [id number] Patient Account Number: [id number] Date of Birth/Sex: Treating RN: 1945-05-29 (75 y.o. [name] Primary Care Provider: [name] Other Clinician: Referring Provider: Treating Provider/Extender: [name] in Treatment: 13 Diagnosis Coding ICD-10 Codes Code Description [phone] 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ity I87.2 Venous insufficiency (chronic) (peripheral) I48.19 Other persistent atrial fibrillation Z79.01 Long term (current) use of anticoagulants I50.40 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 failure M1A.9XX1 Chronic gout, unspecified, with tophus  (tophi) N18.4 Chronic kidney disease, stage 4 (severe) Facility Procedures CPT4 Code: [id number] Description: [phone] - WOUND CARE VISIT-LEV 2 EST PT Modifier: 1 Quantity: Physician Procedures : CPT4 Code Description Modifier [id number] - WC PHYS LEVEL 3 - EST PT ICD-10 Diagnosis Description L97.829 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ity I87.2 Venous insufficiency (chronic) (peripheral) I50.40  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

## 2020-08-16 NOTE — Progress Notes (Signed)
Harry, Foster ([id number]) Visit Report for 08/01/2020 Arrival Information Details Patient Name: Date of Service: Harry Foster 08/01/2020 2:30 PM Medical Record Number: [id number] Patient Account Number: [id number] Date of Birth/Sex: Treating RN: December 06, 1945 (75 y.o. Harry Foster Primary Care Abdulahi Schor: [name] Other Clinician: Referring Ebony Rickel: Treating Earlene Bjelland/Extender: [name] in Treatment: 11 Visit Information History Since Last Visit Added or deleted any medications: No Patient Arrived: Ambulatory Any new allergies or adverse reactions: No Arrival Time: 14:47 Had a fall or experienced change in No Accompanied By: self activities of daily living that may affect Transfer Assistance: Manual risk of falls: Patient Identification Verified: Yes Signs or symptoms of abuse/neglect since last visito No Secondary Verification Process Completed: Yes Hospitalized since last visit: No Patient Requires Transmission-Based Precautions: No Implantable device outside of the clinic excluding No Patient Has Alerts: Yes cellular tissue based products placed in the center Patient Alerts: R ABI: 1.63 TBI: 1.36 since last visit: L ABI: 1.49 TBI: 1.34 Has Dressing in Place as Prescribed: Yes 05/2020 Pain Present Now: No Electronic Signature(s) Signed: 08/16/2020 11:33:27 AM By: [name] Entered By: [name] on 08/01/2020 14:50:34 -------------------------------------------------------------------------------- Compression Therapy Details Patient Name: Date of Service: Harry Foster 08/01/2020 2:30 PM Medical Record Number: [id number] Patient Account Number: [id number] Date of Birth/Sex: Treating RN: 1945-09-26 (75 y.o. Harry Foster Primary Care [name]: [name] Other Clinician: Referring [name]: Treating [name]/Extender: [name] in Treatment: 11 Compression  Therapy Performed for Wound Assessment: Wound #7R Left,Distal,Medial Lower Leg Performed By: Clinician Baruch Gouty, RN Compression Type: Three Layer Post Procedure Diagnosis Same as Pre-procedure Electronic Signature(s) Signed: 08/01/2020 6:11:21 PM By: Baruch Gouty RN, BSN Entered By: Baruch Gouty on 08/01/2020 15:27:18 -------------------------------------------------------------------------------- Encounter Discharge Information Details Patient Name: Date of Service: Harry Foster 08/01/2020 2:30 PM Medical Record Number: [id number] Patient Account Number: [id number] Date of Birth/Sex: Treating RN: 03/26/1945 (75 y.o. Harry Foster Primary Care [name]: [name] Other Clinician: Referring Harry Foster: Treating Harry Foster/Extender: [name] in Treatment: 11 Encounter Discharge Information Items Discharge Condition: Stable Ambulatory Status: Ambulatory Discharge Destination: Home Transportation: Private Auto Accompanied By: self Schedule Follow-up Appointment: Yes Clinical Summary of Care: Patient Declined Electronic Signature(s) Signed: 08/01/2020 6:11:21 PM By: Baruch Gouty RN, BSN Entered By: Baruch Gouty on 08/01/2020 15:41:28 -------------------------------------------------------------------------------- Lower Extremity Assessment Details Patient Name: Date of Service: Harry Foster. 08/01/2020 2:30 PM Medical Record Number: [id number] Patient Account Number: [id number] Date of Birth/Sex: Treating RN: 1945/12/30 (75 y.o. Harry Foster Primary Care Martice Doty: [name] Other Clinician: Referring Tienna Bienkowski: Treating [name]/Extender: [name] in Treatment: 11 Edema Assessment Assessed: Shirlyn Goltz: No] Patrice Paradise: No] Edema: [Left: Ye] [Right: s] Calf Left: Right: Point of Measurement: From Medial Instep 46 cm Ankle Left: Right: Point of Measurement: From  Medial Instep 25 cm Vascular Assessment Pulses: Dorsalis Pedis Palpable: [Left:Yes] Electronic Signature(s) Signed: 08/01/2020 5:51:46 PM By: Levan [name] RN, BSN Entered By: Levan [name] on 08/01/2020 15:03:06 -------------------------------------------------------------------------------- Multi Wound Chart Details Patient Name: Date of Service: Harry Foster. 08/01/2020 2:30 PM Medical Record Number: [id number] Patient Account Number: [id number] Date of Birth/Sex: Treating RN: [birth year] (75 y.o. Harry Foster Primary Care [name]: [name] Other Clinician: Referring [name]: Treating Harry Foster/Extender: [name] in Treatment: 11 Vital Signs Height(in): 71 Pulse(bpm): 66 Weight(lbs): 803 Blood Pressure(mmHg): 101/65 Body  Mass Index(BMI): 41 Temperature(F): 98.4 Respiratory Rate(breaths/min): 18 Photos: [N/A:N/A] Left, Distal, Medial Lower Leg N/A N/A Wound Location: Blister N/A N/A Wounding Event: Venous Leg Ulcer N/A N/A Primary Etiology: Cataracts, Arrhythmia, Congestive N/A N/A Comorbid History: Heart Failure, Hypertension, Peripheral Venous Disease, Gout, Osteoarthritis 07/12/2020 N/A N/A Date Acquired: 2 N/A N/A Weeks of Treatment: Open N/A N/A Wound Status: Yes N/A N/A Wound Recurrence: 5.5x5x0.1 N/A N/A Measurements L x W x D (cm) 21.598 N/A N/A A (cm) : rea 2.16 N/A N/A Volume (cm) : -2250.20% N/A N/A % Reduction in Area: -2247.80% N/A N/A % Reduction in Volume: Full Thickness Without Exposed N/A N/A Classification: Support Structures Medium N/A N/A Exudate Amount: Serosanguineous N/A N/A Exudate Type: red, brown N/A N/A Exudate Color: Flat and Intact N/A N/A Wound Margin: Large (67-100%) N/A N/A Granulation Amount: Red N/A N/A Granulation Quality: None Present (0%) N/A N/A Necrotic Amount: Fat Layer (Subcutaneous Tissue): Yes N/A N/A Exposed Structures: Fascia:  No Tendon: No Muscle: No Joint: No Bone: No None N/A N/A Epithelialization: Compression Therapy N/A N/A Procedures Performed: Treatment Notes Wound #7R (Lower Leg) Wound Laterality: Left, Medial, Distal Cleanser Peri-Wound Care Triamcinolone 15 (g) Discharge Instruction: Use triamcinolone 15 (g) as directed Sween Lotion (Moisturizing lotion) Discharge Instruction: Apply moisturizing lotion as directed Topical Primary Dressing Hydrofera Blue Ready Foam, 4x5 in Discharge Instruction: Apply to wound bed as instructed Secondary Dressing Woven Gauze Sponge, Non-Sterile 4x4 in Discharge Instruction: Apply over primary dressing as directed. Secured With Compression Wrap ThreePress (3 layer compression wrap) Discharge Instruction: Apply three layer compression as directed. Compression Stockings Add-Ons Electronic Signature(s) Signed: 08/01/2020 3:58:49 PM By: Kalman Shan DO Signed: 08/01/2020 5:51:46 PM By: Levan [name] RN, BSN Entered By: Kalman Shan on 08/01/2020 15:53:00 -------------------------------------------------------------------------------- Multi-Disciplinary Care Plan Details Patient Name: Date of Service: Harry Foster 08/01/2020 2:30 PM Medical Record Number: [id number] Patient Account Number: [id number] Date of Birth/Sex: Treating RN: 01-Jul-1945 (75 y.o. Harry Foster Primary Care Emanuele Mcwhirter: [name] Other Clinician: Referring Kjersten Ormiston: Treating Dickey Caamano/Extender: [name] in Treatment: Naturita reviewed with physician Active Inactive Wound/Skin Impairment Nursing Diagnoses: Impaired tissue integrity Knowledge deficit related to ulceration/compromised skin integrity Goals: Patient/caregiver will verbalize understanding of skin care regimen Date Initiated: 05/16/2020 Target Resolution Date: 09/02/2020 Goal Status: Active Ulcer/skin breakdown will have a volume reduction of  30% by week 4 Date Initiated: 05/16/2020 Date Inactivated: 06/14/2020 Target Resolution Date: 06/17/2020 Goal Status: Met Interventions: Assess patient/caregiver ability to obtain necessary supplies Assess patient/caregiver ability to perform ulcer/skin care regimen upon admission and as needed Assess ulceration(s) every visit Provide education on ulcer and skin care Notes: Electronic Signature(s) Signed: 08/01/2020 5:51:46 PM By: Levan [name] RN, BSN Entered By: Levan [name] on 08/01/2020 15:04:13 -------------------------------------------------------------------------------- Pain Assessment Details Patient Name: Date of Service: Harry Foster 08/01/2020 2:30 PM Medical Record Number: [id number] Patient Account Number: [id number] Date of Birth/Sex: Treating RN: 05-27-1945 (75 y.o. Harry Foster Primary Care Ahmon Tosi: [name] Other Clinician: Referring Letesha Klecker: Treating Timathy Newberry/Extender: [name] in Treatment: 11 Active Problems Location of Pain Severity and Description of Pain Patient Has Paino No Site Locations Pain Management and Medication Current Pain Management: Electronic Signature(s) Signed: 08/01/2020 5:51:46 PM By: Levan [name] RN, BSN Signed: 08/16/2020 11:33:27 AM By: [name] Entered By: [name] on 08/01/2020 14:52:12 -------------------------------------------------------------------------------- Patient/Caregiver Education Details Patient Name: Date of Service: Harry Foster 8/1/2022andnbsp2:30 PM Medical Record Number: [id number]  Patient Account Number: [id number] Date of Birth/Gender: Treating RN: [birth year] (75 y.o. Harry Foster Primary Care Physician: [name] Other Clinician: Referring Physician: Treating Physician/Extender: [name] in Treatment: 11 Education Assessment Education Provided To: Patient Education  Topics Provided Wound/Skin Impairment: Methods: Explain/Verbal Responses: State content correctly [employer]) Signed: 08/01/2020 5:51:46 PM By: Levan [name] RN, BSN Entered By: Levan [name] on 08/01/2020 15:04:25 -------------------------------------------------------------------------------- Wound Assessment Details Patient Name: Date of Service: Harry Foster 08/01/2020 2:30 PM Medical Record Number: [id number] Patient Account Number: [id number] Date of Birth/Sex: Treating RN: 01/30/1945 (75 y.o. Harry Foster Primary Care Levon Penning: [name] Other Clinician: Referring Berton Butrick: Treating Tierre Gerard/Extender: [name] in Treatment: 11 Wound Status Wound Number: 7R Primary Venous Leg Ulcer Etiology: Wound Location: Left, Distal, Medial Lower Leg Wound Open Wounding Event: Blister Status: Date Acquired: 07/12/2020 Comorbid Cataracts, Arrhythmia, Congestive Heart Failure, Hypertension, Weeks Of Treatment: 2 History: Peripheral Venous Disease, Gout, Osteoarthritis Clustered Wound: No Photos Wound Measurements Length: (cm) 5.5 Width: (cm) 5 Depth: (cm) 0.1 Area: (cm) 21.598 Volume: (cm) 2.16 % Reduction in Area: -2250.2% % Reduction in Volume: -2247.8% Epithelialization: None Tunneling: No Undermining: No Wound Description Classification: Full Thickness Without Exposed Support Structures Wound Margin: Flat and Intact Exudate Amount: Medium Exudate Type: Serosanguineous Exudate Color: red, brown Foul Odor After Cleansing: No Slough/Fibrino No Wound Bed Granulation Amount: Large (67-100%) Exposed Structure Granulation Quality: Red Fascia Exposed: No Necrotic Amount: None Present (0%) Fat Layer (Subcutaneous Tissue) Exposed: Yes Tendon Exposed: No Muscle Exposed: No Joint Exposed: No Bone Exposed: No Electronic Signature(s) Signed: 08/01/2020 5:51:46 PM By: Levan [name] RN, BSN Entered By:  Levan [name] on 08/01/2020 15:03:27 -------------------------------------------------------------------------------- Vitals Details Patient Name: Date of Service: Harry Foster. 08/01/2020 2:30 PM Medical Record Number: [id number] Patient Account Number: [id number] Date of Birth/Sex: Treating RN: [birth year] (75 y.o. Harry Foster Primary Care Chance Karam: [name] Other Clinician: Referring Alyanah Elliott: Treating Gerda Yin/Extender: [name] in Treatment: 11 Vital Signs Time Taken: 14:51 Temperature (F): 98.4 Height (in): 71 Pulse (bpm): 66 Weight (lbs): 295 Respiratory Rate (breaths/min): 18 Body Mass Index (BMI): 41.1 Blood Pressure (mmHg): 101/65 Reference Range: 80 - 120 mg / dl Electronic Signature(s) Signed: 08/16/2020 11:33:27 AM By: [name] Entered By: [name] on 08/01/2020 14:52:02

## 2020-08-18 ENCOUNTER — Other Ambulatory Visit: Payer: Medicare Other

## 2020-08-18 ENCOUNTER — Ambulatory Visit (INDEPENDENT_AMBULATORY_CARE_PROVIDER_SITE_OTHER): Payer: Medicare Other

## 2020-08-18 ENCOUNTER — Other Ambulatory Visit: Payer: Self-pay

## 2020-08-18 DIAGNOSIS — I4819 Other persistent atrial fibrillation: Secondary | ICD-10-CM | POA: Diagnosis not present

## 2020-08-18 DIAGNOSIS — Z5181 Encounter for therapeutic drug level monitoring: Secondary | ICD-10-CM

## 2020-08-18 DIAGNOSIS — Z7901 Long term (current) use of anticoagulants: Secondary | ICD-10-CM | POA: Diagnosis not present

## 2020-08-18 DIAGNOSIS — I5032 Chronic diastolic (congestive) heart failure: Secondary | ICD-10-CM

## 2020-08-18 LAB — POCT INR: INR: 2.2 (ref 2.0–3.0)

## 2020-08-18 NOTE — Patient Instructions (Signed)
Description   Continue taking Warfarin 1 tablet daily except for 2 tablets on Fridays. Recheck INR in 6 weeks. Call [facility] for any questions/updates at [phone].

## 2020-08-19 LAB — BASIC METABOLIC PANEL
BUN/Creatinine Ratio: 20 (ref 10–24)
BUN: 47 mg/dL — ABNORMAL HIGH (ref 8–27)
CO2: 21 mmol/L (ref 20–29)
Calcium: 9.9 mg/dL (ref 8.6–10.2)
Chloride: 105 mmol/L (ref 96–106)
Creatinine, Ser: 2.33 mg/dL — ABNORMAL HIGH (ref 0.76–1.27)
Glucose: 96 mg/dL (ref 65–99)
Potassium: 4.7 mmol/L (ref 3.5–5.2)
Sodium: 143 mmol/L (ref 134–144)
eGFR: 28 mL/min/{1.73_m2} — ABNORMAL LOW (ref >59)

## 2020-08-25 ENCOUNTER — Other Ambulatory Visit: Payer: Self-pay | Admitting: *Deleted

## 2020-08-25 DIAGNOSIS — I5032 Chronic diastolic (congestive) heart failure: Secondary | ICD-10-CM

## 2020-08-26 ENCOUNTER — Other Ambulatory Visit (HOSPITAL_COMMUNITY): Payer: Medicare Other

## 2020-08-31 ENCOUNTER — Other Ambulatory Visit: Payer: Medicare Other | Admitting: *Deleted

## 2020-08-31 ENCOUNTER — Other Ambulatory Visit: Payer: Self-pay

## 2020-08-31 DIAGNOSIS — I5032 Chronic diastolic (congestive) heart failure: Secondary | ICD-10-CM

## 2020-09-01 LAB — BASIC METABOLIC PANEL
BUN/Creatinine Ratio: 23 (ref 10–24)
BUN: 53 mg/dL — ABNORMAL HIGH (ref 8–27)
CO2: 24 mmol/L (ref 20–29)
Calcium: 9.9 mg/dL (ref 8.6–10.2)
Chloride: 101 mmol/L (ref 96–106)
Creatinine, Ser: 2.33 mg/dL — ABNORMAL HIGH (ref 0.76–1.27)
Glucose: 99 mg/dL (ref 65–99)
Potassium: 4.4 mmol/L (ref 3.5–5.2)
Sodium: 139 mmol/L (ref 134–144)
eGFR: 28 mL/min/{1.73_m2} — ABNORMAL LOW (ref >59)

## 2020-09-07 ENCOUNTER — Inpatient Hospital Stay: Payer: Medicare Other | Attending: Hematology

## 2020-09-07 ENCOUNTER — Other Ambulatory Visit: Payer: Self-pay

## 2020-09-07 DIAGNOSIS — Z79899 Other long term (current) drug therapy: Secondary | ICD-10-CM | POA: Diagnosis not present

## 2020-09-07 DIAGNOSIS — I4891 Unspecified atrial fibrillation: Secondary | ICD-10-CM | POA: Diagnosis not present

## 2020-09-07 DIAGNOSIS — M109 Gout, unspecified: Secondary | ICD-10-CM | POA: Insufficient documentation

## 2020-09-07 DIAGNOSIS — N1832 Chronic kidney disease, stage 3b: Secondary | ICD-10-CM | POA: Insufficient documentation

## 2020-09-07 DIAGNOSIS — D472 Monoclonal gammopathy: Secondary | ICD-10-CM | POA: Diagnosis not present

## 2020-09-07 LAB — CBC WITH DIFFERENTIAL/PLATELET
Abs Immature Granulocytes: 0.02 10*3/uL (ref 0.00–0.07)
Basophils Absolute: 0.1 10*3/uL (ref 0.0–0.1)
Basophils Relative: 1 %
Eosinophils Absolute: 0.1 10*3/uL (ref 0.0–0.5)
Eosinophils Relative: 3 %
HCT: 40.8 % (ref 39.0–52.0)
Hemoglobin: 13.4 g/dL (ref 13.0–17.0)
Immature Granulocytes: 0 %
Lymphocytes Relative: 24 %
Lymphs Abs: 1.1 10*3/uL (ref 0.7–4.0)
MCH: 31.6 pg (ref 26.0–34.0)
MCHC: 32.8 g/dL (ref 30.0–36.0)
MCV: 96.2 fL (ref 80.0–100.0)
Monocytes Absolute: 0.5 10*3/uL (ref 0.1–1.0)
Monocytes Relative: 11 %
Neutro Abs: 2.8 10*3/uL (ref 1.7–7.7)
Neutrophils Relative %: 61 %
Platelets: 146 10*3/uL — ABNORMAL LOW (ref 150–400)
RBC: 4.24 MIL/uL (ref 4.22–5.81)
RDW: 13.7 % (ref 11.5–15.5)
WBC: 4.6 10*3/uL (ref 4.0–10.5)
nRBC: 0 % (ref 0.0–0.2)

## 2020-09-07 LAB — CMP (CANCER CENTER ONLY)
ALT: 15 U/L (ref 0–44)
AST: 17 U/L (ref 15–41)
Albumin: 3.6 g/dL (ref 3.5–5.0)
Alkaline Phosphatase: 68 U/L (ref 38–126)
Anion gap: 10 (ref 5–15)
BUN: 56 mg/dL — ABNORMAL HIGH (ref 8–23)
CO2: 25 mmol/L (ref 22–32)
Calcium: 9.5 mg/dL (ref 8.9–10.3)
Chloride: 107 mmol/L (ref 98–111)
Creatinine: 2.69 mg/dL — ABNORMAL HIGH (ref 0.61–1.24)
GFR, Estimated: 24 mL/min — ABNORMAL LOW (ref >60)
Glucose, Bld: 123 mg/dL — ABNORMAL HIGH (ref 70–99)
Potassium: 4.1 mmol/L (ref 3.5–5.1)
Sodium: 142 mmol/L (ref 135–145)
Total Bilirubin: 0.6 mg/dL (ref 0.3–1.2)
Total Protein: 6.2 g/dL — ABNORMAL LOW (ref 6.5–8.1)

## 2020-09-08 ENCOUNTER — Ambulatory Visit (HOSPITAL_COMMUNITY): Payer: Medicare Other | Attending: Cardiology

## 2020-09-08 DIAGNOSIS — I5032 Chronic diastolic (congestive) heart failure: Secondary | ICD-10-CM

## 2020-09-08 LAB — ECHOCARDIOGRAM COMPLETE: S' Lateral: 4.2 cm

## 2020-09-08 MED ORDER — PERFLUTREN LIPID MICROSPHERE
1.0000 mL | INTRAVENOUS | Status: AC | PRN
  Administered 2020-09-08: 3 mL via INTRAVENOUS

## 2020-09-09 ENCOUNTER — Telehealth: Payer: Self-pay | Admitting: *Deleted

## 2020-09-09 DIAGNOSIS — I712 Thoracic aortic aneurysm, without rupture, unspecified: Secondary | ICD-10-CM

## 2020-09-09 NOTE — Telephone Encounter (Signed)
Spoke with pt and reviewed results and recommendations per Dr. Tamala Charnley. Pt agreeable to MRI.  Advised I will place order and scheduling will contact him to get this scheduled.  Advised pt to make sure he doesn't schedule this until the 3-6 month mark.

## 2020-09-09 NOTE — Telephone Encounter (Signed)
-----   Message from Belva Crome, MD sent at 09/08/2020  5:05 PM EDT -----   Let the patient know The left heart is strong. 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 Most importantly, he has an aortic aneurysm and and needs an Aortic CT angio or MRI to properly size in 3-6 months. MRI would be bast if he is not claustrophobic, due to CKD. A copy will be sent to Josetta Huddle, MD

## 2020-09-12 ENCOUNTER — Other Ambulatory Visit: Payer: Self-pay

## 2020-09-12 ENCOUNTER — Encounter (HOSPITAL_BASED_OUTPATIENT_CLINIC_OR_DEPARTMENT_OTHER): Payer: Medicare Other | Attending: Internal Medicine | Admitting: Internal Medicine

## 2020-09-12 DIAGNOSIS — M1A9XX1 Chronic gout, unspecified, with tophus (tophi): Secondary | ICD-10-CM | POA: Diagnosis not present

## 2020-09-12 DIAGNOSIS — I872 Venous insufficiency (chronic) (peripheral): Secondary | ICD-10-CM | POA: Diagnosis not present

## 2020-09-12 DIAGNOSIS — Z8249 Family history of ischemic heart disease and other diseases of the circulatory system: Secondary | ICD-10-CM | POA: Insufficient documentation

## 2020-09-12 DIAGNOSIS — L97829 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ity: Secondary | ICD-10-CM | POA: Diagnosis not present

## 2020-09-12 DIAGNOSIS — I504 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 failure: Secondary | ICD-10-CM | POA: Insufficient documentation

## 2020-09-12 DIAGNOSIS — I4819 Other persistent atrial fibrillation: Secondary | ICD-10-CM | POA: Insufficient documentation

## 2020-09-12 DIAGNOSIS — N184 Chronic kidney disease, stage 4 (severe): Secondary | ICD-10-CM | POA: Insufficient documentation

## 2020-09-12 DIAGNOSIS — I130 Hypertensive heart and chronic kidney disease with heart failure and stage 1 through stage 4 chronic kidney disease, or unspecified chronic kidney disease: Secondary | ICD-10-CM | POA: Diagnosis not present

## 2020-09-12 DIAGNOSIS — Z6841 Body Mass Index (BMI) 40.0 and over, adult: Secondary | ICD-10-CM | POA: Diagnosis not present

## 2020-09-12 DIAGNOSIS — N189 Chronic kidney disease, unspecified: Secondary | ICD-10-CM | POA: Insufficient documentation

## 2020-09-12 DIAGNOSIS — M1990 Unspecified osteoarthritis, unspecified site: Secondary | ICD-10-CM | POA: Insufficient documentation

## 2020-09-12 DIAGNOSIS — L97929 Non-pressure chronic ulcer of unspecified part of left lower leg with unspecified severity: Secondary | ICD-10-CM | POA: Insufficient documentation

## 2020-09-12 DIAGNOSIS — Z7901 Long term (current) use of anticoagulants: Secondary | ICD-10-CM | POA: Diagnosis not present

## 2020-09-12 LAB — MULTIPLE MYELOMA PANEL, SERUM
Albumin SerPl Elph-Mcnc: 3.4 g/dL (ref 2.9–4.4)
Albumin/Glob SerPl: 1.5 (ref 0.7–1.7)
Alpha 1: 0.2 g/dL (ref 0.0–0.4)
Alpha2 Glob SerPl Elph-Mcnc: 0.6 g/dL (ref 0.4–1.0)
B-Globulin SerPl Elph-Mcnc: 0.7 g/dL (ref 0.7–1.3)
Gamma Glob SerPl Elph-Mcnc: 0.9 g/dL (ref 0.4–1.8)
Globulin, Total: 2.4 g/dL (ref 2.2–3.9)
IgA: 54 mg/dL — ABNORMAL LOW (ref 61–437)
IgG (Immunoglobin G), Serum: 995 mg/dL (ref 603–1613)
IgM (Immunoglobulin M), Srm: 41 mg/dL (ref 15–143)
M Protein SerPl Elph-Mcnc: 0.7 g/dL — ABNORMAL HIGH
Total Protein ELP: 5.8 g/dL — ABNORMAL LOW (ref 6.0–8.5)

## 2020-09-12 NOTE — Progress Notes (Signed)
Harry, Foster ([id number]) Visit Report for 09/12/2020 Chief Complaint Document Details Patient Name: Date of Service: Harry Foster 09/12/2020 2:00 PM Medical Record Number: [id number] Patient Account Number: [id number] Date of Birth/Sex: Treating RN: [birth year] (75 y.o. [name] Primary Care Provider: [name] Other Clinician: Referring Provider: Treating Provider/Extender: [name] in Treatment: 17 Information Obtained from: Patient Chief Complaint Left lower extremity skin breakdown Electronic Signature(s) Signed: 09/12/2020 4:25:17 PM By: Kalman Shan DO Entered By: Kalman Shan on 09/12/2020 16:18:32 -------------------------------------------------------------------------------- Debridement Details Patient Name: Date of Service: Harry Foster 09/12/2020 2:00 PM Medical Record Number: [id number] Patient Account Number: [id number] Date of Birth/Sex: Treating RN: [birth year] (75 y.o. [name] Primary Care Provider: [name] Other Clinician: Referring Provider: Treating Provider/Extender: [name] in Treatment: 17 Debridement Performed for Assessment: Wound #8 Left,Anterior Lower Leg Performed By: Physician Kalman Shan, DO Debridement Type: Debridement Severity of Tissue Pre Debridement: Fat layer exposed Level of Consciousness (Pre-procedure): Awake and Alert Pre-procedure Verification/Time Out Yes - 15:12 Taken: Start Time: 15:13 T Area Debrided (L x W): otal 2.5 (cm) x 1.5 (cm) = 3.75 (cm) Tissue and other material debrided: Non-Viable, Skin: Dermis Level: Skin/Dermis Debridement Description: Selective/Open Wound Instrument: Other : Gauze, Anasept Bleeding: None End Time: 15:16 Response to Treatment: Procedure was tolerated well Level of Consciousness (Post- Awake and Alert procedure): Post Debridement Measurements of Total  Wound Length: (cm) 3.3 Width: (cm) 2.5 Depth: (cm) 0.1 Volume: (cm) 0.648 Character of Wound/Ulcer Post Debridement: Stable Severity of Tissue Post Debridement: Fat layer exposed Post Procedure Diagnosis Same as Pre-procedure Electronic Signature(s) Signed: 09/12/2020 4:25:17 PM By: Kalman Shan DO Signed: 09/12/2020 4:41:05 PM By: Lorrin Jackson Entered By: Lorrin Jackson on 09/12/2020 15:24:11 -------------------------------------------------------------------------------- HPI Details Patient Name: Date of Service: Harry Leventhal G. 09/12/2020 2:00 PM Medical Record Number: [id number] Patient Account Number: [id number] Date of Birth/Sex: Treating RN: [birth year] (75 y.o. [name] Primary Care Provider: [name] Other Clinician: Referring Provider: Treating Provider/Extender: [name] in Treatment: 17 History of Present Illness HPI Description: Harry Foster is a 75 year old male with a past medical history of persistent atrial fibrillation on Coumadin, hypertension, gout, CHF And venous insufficiency that presents to the clinic for bilateral lower extremity wounds. He states he has a history of blistering to his legs that eventually pop and heal. He states that in the past 6 weeks he has been unable to heal his wounds. He keeps them wrapped however they soak through quickly and has to change the wraps frequently. 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 He sleeps in a recliner at night and keeps his legs down due to increased pain when they are elevated. He states this symptom started a few weeks ago. He denies signs of infection. 5/23; patient presents for 1 week follow-up. He had Kerlix/Coban wrap placed at last clinic visit and changed again last week due to increased drainage. He states that there has been an improvement in swelling and drainage over the past week. He is going to  have arterial studies done tomorrow. He denies signs of infection. 5/31; patient presents for 1 week follow-up. He has been doing well with Kerlix/Coban wrap. He denies signs of infection. 6/7; patient presents for 1 week follow-up. He has been using Kerlix/Coban wraps bilaterally with collagen underneath. He has tolerated these well.  He denies signs of infection. The right leg no longer has wounds. He has his Harry Foster wraps today 6/14; the patient is effectively healed on the left leg. He already is wearing his Harry Foster wraps on the right leg he has 20/30 stockings below this. He has a stocking for the left leg. He says he is going to go to elastic therapy for 20/30 stockings to put under the Farrow wraps 6/30; patient was discharged from the clinic 2 weeks ago for closed wounds. He reports using Farrow wraps bilaterally. He says he noticed in the past week he was developing new wounds. He denies signs of infection. 7/12; patient presents for follow-up. He has been tolerating the Kerlix/Coban well. When the wrap was taken off today he noticed a blister and this opened in the clinic. He denies signs of infection. 7/21. The patient arrives healed. Previously followed by Dr. Heber Foster. He has severe chronic venous insufficiency. The blister he noted last week on the left anterior leg is closed. He has Farrow wrap stockings he is familiar with these. I cannot really determine from him today whether he was actually using these when these wounds formed. Readmission 8/1 Patient presents again for skin breakdown to his left lower extremity. He was discharged less than 2 weeks ago for a wound to his left lower extremity. He states that on discharge the following day he had a blister that opened and popped. Its been healing since. He has been tightly wrapping his leg from the ankle up and placing a Farrow wrap over this. He currently denies signs of infection. 8/15; patient presents for 2-week follow-up. He states  that his diuretic was increased recently by his cardiologist. He reports improvement to his lower extremity swelling. He states that the wounds are healed. Readmission 8/12 Patient presents again for left lower extremity wound limited to skin breakdown. He has been using Mepilex light dressings with antibiotic ointment. He continues to use his Farrow wrap. He is on 120 mg of Lasix daily. He denies signs of infection. Electronic Signature(s) Signed: 09/12/2020 4:25:17 PM By: Kalman Shan DO Entered By: Kalman Shan on 09/12/2020 16:19:25 -------------------------------------------------------------------------------- Physical Exam Details Patient Name: Date of Service: Harry Leventhal G. 09/12/2020 2:00 PM Medical Record Number: [id number] Patient Account Number: [id number] Date of Birth/Sex: Treating RN: 12/18/1945 (75 y.o. [name] Primary Care Provider: Other Clinician: [name] Referring Provider: Treating Provider/Extender: [name] in Treatment: 17 Constitutional respirations regular, non-labored and within target range for patient.. Cardiovascular 2+ dorsalis pedis/posterior tibialis pulses. Psychiatric pleasant and cooperative. Notes Left lower extremity: Open wound limited to skin breakdown and almost epithelialized to the distal aspect. 2+ pitting edema to the knee. No signs of infection. Electronic Signature(s) Signed: 09/12/2020 4:25:17 PM By: Kalman Shan DO Entered By: Kalman Shan on 09/12/2020 16:19:59 -------------------------------------------------------------------------------- Physician Orders Details Patient Name: Date of Service: Harry Leventhal G. 09/12/2020 2:00 PM Medical Record Number: [id number] Patient Account Number: [id number] Date of Birth/Sex: Treating RN: [birth year] (75 y.o. 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 - with Dr. Heber [place] Return A Edema Control - Lymphedema / SCD / Other Elevate legs to the level of the heart or above for 30 minutes daily and/or when sitting, a frequency of: Avoid standing for long periods of time. Moisturize legs daily. Compression stocking or Garment 20-30 mm/Hg pressure to: - Patient has Juxtalite Compression garment Additional Orders / Instructions Follow Nutritious Diet Wound Treatment Wound #8 - Lower Leg Wound Laterality: Left, Anterior Cleanser: Soap and Water Every Other Day/15 Days Discharge Instructions: May shower and wash wound with dial antibacterial soap and water prior to dressing change. Cleanser: Wound Cleanser Every Other Day/15 Days Discharge Instructions: Cleanse the wound with wound cleanser prior to applying a clean dressing using gauze sponges, not tissue or cotton balls. Topical: Antibiotic Ointment Every Other Day/15 Days Secondary Dressing: Mepilex Lite Silicone Border 4x4 Every Other Day/15 Days Services and Therapies Venous Studies -Bilateral - Venous Reflux Studies for bilateral legs: Reoccurring blisters/wounds on legs, uses compression garment. Electronic Signature(s) Signed: 09/12/2020 4:25:17 PM By: Kalman Shan DO Entered By: Kalman Shan on 09/12/2020 16:20:27 Prescription  09/12/2020 -------------------------------------------------------------------------------- Karle Plumber Kalman Shan DO Patient Name: Provider: 08-01-1945 [id number] Date of Birth: NPI#: Jerilynn [id number] [id number] Sex: DEA #: [phone] Phone #: License #: [place] Patient Address: [facility] [address] [place], [place] [address] [place],  [phone] Allergies lisinopril; Quinolones Provider's Orders Venous Studies -Bilateral - Venous Reflux Studies for bilateral legs: Reoccurring blisters/wounds on legs, uses compression garment. Hand Signature: Date(s): Electronic Signature(s) Signed: 09/12/2020 4:25:17 PM By: Kalman Shan DO Entered By: Kalman Shan on 09/12/2020 16:20:27 -------------------------------------------------------------------------------- Problem List Details Patient Name: Date of Service: Harry Leventhal G. 09/12/2020 2:00 PM Medical Record Number: [id number] Patient Account Number: [id number] Date of Birth/Sex: Treating RN: 19-Aug-1945 (75 y.o. [name] Primary Care Provider: [name] Other Clinician: Referring Provider: Treating Provider/Extender: [name] in Treatment: 17 Active Problems ICD-10 Encounter Code Description Active Date MDM Diagnosis L97.829 Non-pressure chronic ulcer of other part of left lower leg with unspecified 05/16/2020 No Yes severity I87.2 Venous insufficiency (chronic) (peripheral) 05/16/2020 No Yes I48.19 Other persistent atrial fibrillation 05/16/2020 No Yes Z79.01 Long term (current) use of anticoagulants 05/16/2020 No Yes I50.40 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 05/16/2020 No Yes failure M1A.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 Chronic gout, unspecified, with tophus (tophi) 05/16/2020 No Yes N18.4 Chronic kidney disease, stage 4 (severe) 05/16/2020 No Yes Inactive  Problems Resolved Problems ICD-10 Code Description Active Date Resolved Date L97.919 Non-pressure chronic ulcer of unspecified part of right lower leg with unspecified 05/16/2020 05/16/2020 severity Electronic Signature(s) Signed: 09/12/2020 4:25:17 PM By: Kalman Shan DO Entered By: Kalman Shan on 09/12/2020 16:18:18 -------------------------------------------------------------------------------- Progress Note Details Patient Name: Date of Service: Harry Leventhal G. 09/12/2020 2:00 PM Medical Record Number: [id number] Patient Account Number: [id number] Date of Birth/Sex: Treating RN: 04/21/1945 (75 y.o. [name] Primary Care Provider: [name] Other Clinician: Referring Provider: Treating Provider/Extender: [name] in Treatment: 17 Subjective Chief Complaint Information obtained from Patient Left lower extremity skin breakdown History of Present Illness (HPI) Harry Foster is a 75 year old male with a past medical history of persistent atrial fibrillation on Coumadin, hypertension, gout, CHF And venous insufficiency that presents to the clinic for bilateral lower extremity wounds. He states  he has a history of blistering to his legs that eventually pop and heal. He states that in the past 6 weeks he has been unable to heal his wounds. He keeps them wrapped however they soak through quickly and has to change the wraps frequently. 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 He sleeps in a recliner at night and keeps his legs down due to increased pain when they are elevated. He states this symptom started a few weeks ago. He denies signs of infection. 5/23; patient presents for 1 week follow-up. He had Kerlix/Coban wrap placed at last clinic visit and changed again last week due to increased drainage. He states that there has been an improvement in swelling and drainage over the past  week. He is going to have arterial studies done tomorrow. He denies signs of infection. 5/31; patient presents for 1 week follow-up. He has been doing well with Kerlix/Coban wrap. He denies signs of infection. 6/7; patient presents for 1 week follow-up. He has been using Kerlix/Coban wraps bilaterally with collagen underneath. He has tolerated these well. He denies signs of infection. The right leg no longer has wounds. He has his Harry Foster wraps today 6/14; the patient is effectively healed on the left leg. He already is wearing his Harry Foster wraps on the right leg he has 20/30 stockings below this. He has a stocking for the left leg. He says he is going to go to elastic therapy for 20/30 stockings to put under the Farrow wraps 6/30; patient was discharged from the clinic 2 weeks ago for closed wounds. He reports using Farrow wraps bilaterally. He says he noticed in the past week he was developing new wounds. He denies signs of infection. 7/12; patient presents for follow-up. He has been tolerating the Kerlix/Coban well. When the wrap was taken off today he noticed a blister and this opened in the clinic. He denies signs of infection. 7/21. The patient arrives healed. Previously followed by Dr. Heber Zortman. He has severe chronic venous insufficiency. The blister he noted last week on the left anterior leg is closed. He has Farrow wrap stockings he is familiar with these. I cannot really determine from him today whether he was actually using these when these wounds formed. Readmission 8/1 Patient presents again for skin breakdown to his left lower extremity. He was discharged less than 2 weeks ago for a wound to his left lower extremity. He states that on discharge the following day he had a blister that opened and popped. Its been healing since. He has been tightly wrapping his leg from the ankle up and placing a Farrow wrap over this. He currently denies signs of infection. 8/15; patient presents for 2-week  follow-up. He states that his diuretic was increased recently by his cardiologist. He reports improvement to his lower extremity swelling. He states that the wounds are healed. Readmission 8/12 Patient presents again for left lower extremity wound limited to skin breakdown. He has been using Mepilex light dressings with antibiotic ointment. He continues to use his Farrow wrap. He is on 120 mg of Lasix daily. He denies signs of infection. Patient History Information obtained from Patient. Family History Cancer - Mother,Siblings, Heart Disease - Father, No family history of Diabetes, Hereditary Spherocytosis, Hypertension, Kidney Disease, Lung Disease, Seizures, Stroke, Thyroid Problems, Tuberculosis. Social History Never smoker, Marital Status - Widowed, Alcohol Use - Never, Drug Use - No History, Caffeine Use - Daily - coffee. Medical History Eyes Patient  has history of Cataracts - mild Denies history of Glaucoma, Optic Neuritis Ear/Nose/Mouth/Throat Denies history of Chronic sinus problems/congestion, Middle ear problems Cardiovascular Patient has history of Arrhythmia - afib, Congestive Heart Failure, Hypertension, Peripheral Venous Disease Endocrine Denies history of Type I Diabetes, Type II Diabetes Genitourinary Denies history of End Stage Renal Disease Integumentary (Skin) Denies history of History of Burn Musculoskeletal Patient has history of Gout, Osteoarthritis Denies history of Rheumatoid Arthritis, Osteomyelitis Oncologic Denies history of Received Chemotherapy, Received Radiation Psychiatric Denies history of Anorexia/bulimia, Confinement Anxiety Hospitalization/Surgery History - bil knee replacements. Medical A Surgical History Notes nd Constitutional Symptoms (General Health) morbid obesity Genitourinary CKD stage 3 Objective Constitutional respirations regular, non-labored and within target range for patient.. Vitals Time Taken: 2:30 PM, Height: 71 in,  Weight: 295 lbs, BMI: 41.1, Temperature: 98.5 F, Pulse: 63 bpm, Respiratory Rate: 20 breaths/min, Blood Pressure: 136/88 mmHg. Cardiovascular 2+ dorsalis pedis/posterior tibialis pulses. Psychiatric pleasant and cooperative. General Notes: Left lower extremity: Open wound limited to skin breakdown and almost epithelialized to the distal aspect. 2+ pitting edema to the knee. No signs of infection. Integumentary (Hair, Skin) Wound #8 status is Open. Original cause of wound was Blister. The date acquired was: 09/06/2020. The wound is located on the Left,Anterior Lower Leg. The wound measures 3.3cm length x 2.5cm width x 0.1cm depth; 6.48cm^2 area and 0.648cm^3 volume. There is Fat Layer (Subcutaneous Tissue) exposed. There is no tunneling or undermining noted. There is a medium amount of serous drainage noted. The wound margin is distinct with the outline attached to the wound base. There is large (67-100%) red, pink granulation within the wound bed. There is no necrotic tissue within the wound bed. 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 The wound is limited to skin breakdown and appears almost closed. He states he has been developing blisters that he opens up and drains and the wound usually resolves. He has been using Mepilex light dressings with antibiotic ointment and would like to continue these. He would like to stay in his Farrow wrap and does not want to do any office compression wraps. I recommended he obtain venous reflux studies. He would like  close follow-up to make sure these do not worsen. I will see him in 1 week. Procedures Wound #8 Pre-procedure diagnosis of Wound #8 is a Venous Leg Ulcer located on the Left,Anterior Lower Leg .Severity of Tissue Pre Debridement is: Fat layer exposed. There was a Selective/Open Wound Skin/Dermis Debridement with a total area of 3.75 sq cm performed by Kalman Shan, DO. With the following instrument(s): Gauze, Anasept to remove Non-Viable tissue/material. Material removed includes Skin: Dermis. No specimens were taken. A time out was conducted at 15:12, prior to the start of the procedure. There was no bleeding. The procedure was tolerated well. Post Debridement Measurements: 3.3cm length x 2.5cm width x 0.1cm depth; 0.648cm^3 volume. Character of Wound/Ulcer Post Debridement is stable. Severity of Tissue Post Debridement is: Fat layer exposed. Post procedure Diagnosis Wound #8: Same as Pre-Procedure Plan Follow-up Appointments: Return Appointment in 1 week. - with Dr. Heber [place] Edema Control - Lymphedema / SCD / Other: Elevate legs to the level of the heart  or above for 30 minutes daily and/or when sitting, a frequency of: Avoid standing for long periods of time. Moisturize legs daily. Compression stocking or Garment 20-30 mm/Hg pressure to: - Patient has Juxtalite Compression garment Additional Orders / Instructions: Follow Nutritious Diet Services and Therapies ordered were: Venous Studies -Bilateral - Venous Reflux Studies for bilateral legs: Reoccurring blisters/wounds on legs, uses compression garment. WOUND #8: - Lower Leg Wound Laterality: Left, Anterior Cleanser: Soap and Water Every Other Day/15 Days Discharge Instructions: May shower and wash wound with dial antibacterial soap and water prior to dressing change. Cleanser: Wound Cleanser Every Other Day/15 Days Discharge Instructions: Cleanse the wound with wound cleanser prior to applying a clean dressing using gauze sponges,  not tissue or cotton balls. Topical: Antibiotic Ointment Every Other Day/15 Days Secondary Dressing: Mepilex Lite Silicone Border 4x4 Every Other Day/15 Days 1. Mepilex light dressings with antibiotic ointment 2. Farrow wraps 3. Venous reflux studies 4. Follow-up in 1 week Electronic Signature(s) Signed: 09/12/2020 4:25:17 PM By: Kalman Shan DO Entered By: Kalman Shan on 09/12/2020 16:24:29 -------------------------------------------------------------------------------- HxROS Details Patient Name: Date of Service: Harry Leventhal G. 09/12/2020 2:00 PM Medical Record Number: [id number] Patient Account Number: [id number] Date of Birth/Sex: Treating RN: 1945/09/27 (75 y.o. [name] Primary Care Provider: [name] Other Clinician: Referring Provider: Treating Provider/Extender: [name] in Treatment: 17 Information Obtained From Patient Constitutional Symptoms (General Health) Medical History: Past Medical History Notes: morbid obesity Eyes Medical History: Positive for: Cataracts - mild Negative for: Glaucoma; Optic Neuritis Ear/Nose/Mouth/Throat Medical History: Negative for: Chronic sinus problems/congestion; Middle ear problems Cardiovascular Medical History: Positive for: Arrhythmia - afib; Congestive Heart Failure; Hypertension; Peripheral Venous Disease Endocrine Medical History: Negative for: Type I Diabetes; Type II Diabetes Genitourinary Medical History: Negative for: End Stage Renal Disease Past Medical History Notes: CKD stage 3 Integumentary (Skin) Medical History: Negative for: History of Burn Musculoskeletal Medical History: Positive for: Gout; Osteoarthritis Negative for: Rheumatoid Arthritis; Osteomyelitis Oncologic Medical History: Negative for: Received Chemotherapy; Received Radiation Psychiatric Medical History: Negative for: Anorexia/bulimia; Confinement Anxiety HBO Extended  History Items Eyes: Cataracts Immunizations Pneumococcal Vaccine: Received Pneumococcal Vaccination: Yes Received Pneumococcal Vaccination On or After 60th Birthday: No Implantable Devices No devices added Hospitalization / Surgery History Type of Hospitalization/Surgery bil knee replacements Family and Social History Cancer: Yes - Mother,Siblings; Diabetes: No; Heart Disease: Yes - Father; Hereditary Spherocytosis: No; Hypertension: No; Kidney Disease: No; Lung Disease: No; Seizures: No; Stroke: No; Thyroid Problems: No; Tuberculosis: No; Never smoker; Marital Status - Widowed; Alcohol Use: Never; Drug Use: No History; Caffeine Use: Daily - coffee; Financial Concerns: No; Food, Clothing or Shelter Needs: No; Support System Lacking: No; Transportation Concerns: No Electronic Signature(s) Signed: 09/12/2020 4:25:17 PM By: Kalman Shan DO Signed: 09/12/2020 4:41:05 PM By: Lorrin Jackson Entered By: Kalman Shan on 09/12/2020 16:19:30 -------------------------------------------------------------------------------- SuperBill Details Patient Name: Date of Service: Harry Foster 09/12/2020 Medical Record Number: [id number] Patient Account Number: [id number] Date of Birth/Sex: Treating RN: 1945/02/06 (75 y.o. 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40  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 failure N18.4 Chronic kidney disease, stage 4 (severe) Quantity: 1 : [id number] - WC PHYS DEBR WO ANESTH 20 SQ CM 1 ICD-10 Diagnosis Description L97.829 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ity Quantity: Electronic Signature(s) Signed: 09/12/2020 4:25:17 PM By: Kalman Shan DO Entered By: Kalman Shan on 09/12/2020 16:24:54

## 2020-09-12 NOTE — Progress Notes (Addendum)
JAGAR, LUA ([id number]) Visit Report for 09/12/2020 Arrival Information Details Patient Name: Date of Service: Harry Foster 09/12/2020 2:00 PM Medical Record Number: [id number] Patient Account Number: [id number] Date of Birth/Sex: Treating RN: [birth year] (75 y.o. [name] Primary Care Mavin Dyke: [name] Other Clinician: Referring Ankush Gintz: Treating Ozzie Remmers/Extender: [name] in Treatment: 64 Visit Information History Since Last Visit Added or deleted any medications: No Patient Arrived: Ambulatory Any new allergies or adverse reactions: No Arrival Time: 14:29 Had a fall or experienced change in No Accompanied By: self activities of daily living that may affect Transfer Assistance: None risk of falls: Patient Identification Verified: Yes Signs or symptoms of abuse/neglect since last visito No Secondary Verification Process Completed: Yes Hospitalized since last visit: No Patient Requires Transmission-Based Precautions: No Implantable device outside of the clinic excluding No Patient Has Alerts: Yes cellular tissue based products placed in the center Patient Alerts: R ABI: 1.63 TBI: 1.36 since last visit: L ABI: 1.49 TBI: 1.34 Has Dressing in Place as Prescribed: Yes 05/2020 Pain Present Now: No Electronic Signature(s) Signed: 09/12/2020 2:52:35 PM By: [name] Entered By: [name] on 09/12/2020 14:30:03 -------------------------------------------------------------------------------- Encounter Discharge Information Details Patient Name: Date of Service: Harry Foster. 09/12/2020 2:00 PM Medical Record Number: [id number] Patient Account Number: [id number] Date of Birth/Sex: Treating RN: [birth year] (75 y.o. [name] Primary Care Cylan Borum: [name] Other Clinician: Referring Cuca Benassi: Treating Annai Heick/Extender: [name] in Treatment:  17 Encounter Discharge Information Items Post Procedure Vitals Discharge Condition: Stable Temperature (F): 98.5 Ambulatory Status: Ambulatory Pulse (bpm): 63 Discharge Destination: Home Respiratory Rate (breaths/min): 20 Transportation: Private Auto Blood Pressure (mmHg): 136/88 Schedule Follow-up Appointment: Yes Clinical Summary of Care: Provided on 09/12/2020 Form Type Recipient Paper Patient Patient Electronic Signature(s) Signed: 09/12/2020 4:41:05 PM By: Lorrin Jackson Entered By: Lorrin Jackson on 09/12/2020 15:26:07 -------------------------------------------------------------------------------- Lower Extremity Assessment Details Patient Name: Date of Service: Harry Leventhal G. 09/12/2020 2:00 PM Medical Record Number: [id number] Patient Account Number: [id number] Date of Birth/Sex: Treating RN: [birth year] (75 y.o. [name] Primary Care Carlitos Bottino: [name] Other Clinician: Referring Antwanette Wesche: Treating Zadin Lange/Extender: [name] in Treatment: 17 Edema Assessment Assessed: Shirlyn Goltz: Yes] Patrice Paradise: No] Edema: [Left: Ye] [Right: s] Calf Left: Right: Point of Measurement: 31 cm From Medial Instep 46 cm Ankle Left: Right: Point of Measurement: 10 cm From Medial Instep 26 cm Vascular Assessment Pulses: Dorsalis Pedis Palpable: [Left:Yes] Electronic Signature(s) Signed: 09/12/2020 4:41:05 PM By: Lorrin Jackson Entered By: Lorrin Jackson on 09/12/2020 14:34:46 -------------------------------------------------------------------------------- Multi Wound Chart Details Patient Name: Date of Service: Harry Leventhal G. 09/12/2020 2:00 PM Medical Record Number: [id number] Patient Account Number: [id number] Date of Birth/Sex: Treating RN: Jan 30, 1945 (75 y.o. [name] Primary Care Kathey Simer: [name] Other Clinician: Referring Kaely Hollan: Treating Tenlee Wollin/Extender: [name] in Treatment: 17 Vital Signs Height(in): 71 Pulse(bpm): 45 Weight(lbs): 030 Blood Pressure(mmHg): 136/88 Body Mass Index(BMI): 41 Temperature(F): 98.5 Respiratory Rate(breaths/min): 20 Photos: [8:Left, Anterior Lower Leg] [N/A:N/A N/A] Wound Location: [8:Blister] [N/A:N/A] Wounding Event: [8:Venous Leg Ulcer] [N/A:N/A] Primary Etiology: [8:Cataracts, Arrhythmia, Congestive N/A] Comorbid History: [8:Heart Failure, Hypertension, Peripheral Venous Disease, Gout, Osteoarthritis 09/06/2020] [N/A:N/A] Date Acquired: [8:0] [N/A:N/A] Weeks of Treatment: [8:Open] [N/A:N/A] Wound Status: [8:3.3x2.5x0.1] [N/A:N/A] Measurements L x W x D (cm) [8:6.48] [N/A:N/A] A (cm) : rea [8:0.648] [N/A:N/A] Volume (cm) : [8:0.00%] [N/A:N/A] % Reduction in A [8:rea:  0.00%] [N/A:N/A] % Reduction in Volume: [8:Full Thickness Without Exposed] [N/A:N/A] Classification: [8:Support Structures Medium] [N/A:N/A] Exudate A mount: [8:Serous] [N/A:N/A] Exudate Type: [8:amber] [N/A:N/A] Exudate Color: [8:Distinct, outline attached] [N/A:N/A] Wound Margin: [8:Large (67-100%)] [N/A:N/A] Granulation A mount: [8:Red, Pink] [N/A:N/A] Granulation Quality: [8:None Present (0%)] [N/A:N/A] Necrotic A mount: [8:Fat Layer (Subcutaneous Tissue): Yes N/A] Exposed Structures: [8:Fascia: No Tendon: No Muscle: No Joint: No Bone: No None] [N/A:N/A] Epithelialization: [8:Debridement - Selective/Open Wound N/A] Debridement: Pre-procedure Verification/Time Out 15:12 [N/A:N/A] Taken: [8:Skin/Dermis] [N/A:N/A] Level: [8:3.75] [N/A:N/A] Debridement A (sq cm): [8:rea Other(Gauze, Anasept)] [N/A:N/A] Instrument: [8:None] [N/A:N/A] Bleeding: [8:Procedure was tolerated well] [N/A:N/A] Debridement Treatment Response: [8:3.3x2.5x0.1] [N/A:N/A] Post Debridement Measurements L x W x D (cm) [8:0.648] [N/A:N/A] Post Debridement Volume: (cm) [8:Debridement] [N/A:N/A] Treatment Notes Wound #8 (Lower Leg) Wound Laterality:  Left, Anterior Cleanser Soap and Water Discharge Instruction: May shower and wash wound with dial antibacterial soap and water prior to dressing change. Wound Cleanser Discharge Instruction: Cleanse the wound with wound cleanser prior to applying a clean dressing using gauze sponges, not tissue or cotton balls. Peri-Wound Care Topical Antibiotic Ointment Primary Dressing Secondary Dressing Mepilex Lite Silicone Border 4x4 Secured With Compression Wrap Compression Stockings Add-Ons Electronic Signature(s) Signed: 09/15/2020 7:49:04 AM By: Lorrin Jackson Previous Signature: 09/12/2020 4:25:17 PM Version By: Kalman Shan DO Previous Signature: 09/12/2020 4:41:05 PM Version By: Lorrin Jackson Entered By: Lorrin Jackson on 09/15/2020 07:49:03 -------------------------------------------------------------------------------- Multi-Disciplinary Care Plan Details Patient Name: Date of Service: Harry Foster. 09/12/2020 2:00 PM Medical Record Number: [id number] Patient Account Number: [id number] Date of Birth/Sex: Treating RN: [birth year] (75 y.o. [name] Primary Care Anglia Blakley: [name] Other Clinician: Referring Marieme Mcmackin: Treating Shavaun Osterloh/Extender: [name] in Treatment: 73 Multidisciplinary Care Plan reviewed with physician Active Inactive Venous Leg Ulcer Nursing Diagnoses: Actual venous Insuffiency (use after diagnosis is confirmed) Knowledge deficit related to disease process and management Goals: Patient will maintain optimal edema control Date Initiated: 05/16/2020 Target Resolution Date: 10/10/2020 Goal Status: Active Patient/caregiver will verbalize understanding of disease process and disease management Date Initiated: 05/16/2020 Date Inactivated: 07/21/2020 Target Resolution Date: 08/05/2020 Goal Status: Met Interventions: Assess peripheral edema status every visit. Compression as ordered Provide education on  venous insufficiency Notes: Wound/Skin Impairment Nursing Diagnoses: Impaired tissue integrity Knowledge deficit related to ulceration/compromised skin integrity Goals: Patient/caregiver will verbalize understanding of skin care regimen Date Initiated: 05/16/2020 Date Inactivated: 08/15/2020 Target Resolution Date: 09/02/2020 Goal Status: Met Ulcer/skin breakdown will have a volume reduction of 30% by week 4 Date Initiated: 05/16/2020 Target Resolution Date: 10/10/2020 Goal Status: Active Interventions: Assess patient/caregiver ability to obtain necessary supplies Assess patient/caregiver ability to perform ulcer/skin care regimen upon admission and as needed Assess ulceration(s) every visit Provide education on ulcer and skin care Notes: 08/15/20: Healed Electronic Signature(s) Signed: 09/12/2020 4:41:05 PM By: Lorrin Jackson Entered By: Lorrin Jackson on 09/12/2020 14:35:58 -------------------------------------------------------------------------------- Pain Assessment Details Patient Name: Date of Service: Harry Foster. 09/12/2020 2:00 PM Medical Record Number: [id number] Patient Account Number: [id number] Date of Birth/Sex: Treating RN: 1945-04-18 (75 y.o. [name] Primary Care Kavonte Bearse: [name] Other Clinician: Referring Shifa Brisbon: Treating Azaylah Stailey/Extender: [name] in Treatment: 17 Active Problems Location of Pain Severity and Description of Pain Patient Has Paino No Site Locations Pain Management and Medication Current Pain Management: Electronic Signature(s) Signed: 09/12/2020 2:52:35 PM By: [name] Signed: 09/12/2020 4:41:05 PM By: Lorrin Jackson Entered By: [name] on 09/12/2020 14:30:28 -------------------------------------------------------------------------------- Patient/Caregiver Education Details  Patient Name: Date of Service: Harry Foster 9/12/2022andnbsp2:00 PM Medical  Record Number: [id number] Patient Account Number: [id number] Date of Birth/Gender: Treating RN: 04-05-1945 (75 y.o. [name] Primary Care Physician: [name] Other Clinician: Referring Physician: Treating Physician/Extender: [name] in Treatment: 17 Education Assessment Education Provided To: Patient Education Topics Provided Venous: Methods: Explain/Verbal, Printed Responses: State content correctly Wound/Skin Impairment: Methods: Explain/Verbal, Printed Responses: State content correctly Electronic Signature(s) Signed: 09/12/2020 4:41:05 PM By: Lorrin Jackson Entered By: Lorrin Jackson on 09/12/2020 14:36:17 -------------------------------------------------------------------------------- Wound Assessment Details Patient Name: Date of Service: Harry Foster. 09/12/2020 2:00 PM Medical Record Number: [id number] Patient Account Number: [id number] Date of Birth/Sex: Treating RN: 1945/04/21 (75 y.o. [name] Primary Care Londyn Hotard: [name] Other Clinician: Referring Alanea Woolridge: Treating Kinsley Holderman/Extender: [name] in Treatment: 17 Wound Status Wound Number: 8 Primary Venous Leg Ulcer Etiology: Wound Location: Left, Anterior Lower Leg Wound Open Wounding Event: Blister Status: Date Acquired: 09/06/2020 Comorbid Cataracts, Arrhythmia, Congestive Heart Failure, Hypertension, Weeks Of Treatment: 0 History: Peripheral Venous Disease, Gout, Osteoarthritis Clustered Wound: No Photos Wound Measurements Length: (cm) 3.3 Width: (cm) 2.5 Depth: (cm) 0.1 Area: (cm) 6.48 Volume: (cm) 0.648 % Reduction in Area: 0% % Reduction in Volume: 0% Epithelialization: None Tunneling: No Undermining: No Wound Description Classification: Full Thickness Without Exposed Support Structures Wound Margin: Distinct, outline attached Exudate Amount: Medium Exudate Type:  Serous Exudate Color: amber Foul Odor After Cleansing: No Slough/Fibrino No Wound Bed Granulation Amount: Large (67-100%) Exposed Structure Granulation Quality: Red, Pink Fascia Exposed: No Necrotic Amount: None Present (0%) Fat Layer (Subcutaneous Tissue) Exposed: Yes Tendon Exposed: No Muscle Exposed: No Joint Exposed: No Bone Exposed: No Treatment Notes Wound #8 (Lower Leg) Wound Laterality: Left, Anterior Cleanser Soap and Water Discharge Instruction: May shower and wash wound with dial antibacterial soap and water prior to dressing change. Wound Cleanser Discharge Instruction: Cleanse the wound with wound cleanser prior to applying a clean dressing using gauze sponges, not tissue or cotton balls. Peri-Wound Care Topical Antibiotic Ointment Primary Dressing Secondary Dressing Mepilex Lite Silicone Border 4x4 Secured With Compression Wrap Compression Stockings Add-Ons Electronic Signature(s) Signed: 09/15/2020 7:48:31 AM By: Lorrin Jackson Signed: 09/15/2020 5:56:16 PM By: Baruch Gouty RN, BSN Previous Signature: 09/12/2020 4:41:05 PM Version By: Lorrin Jackson Previous Signature: 09/12/2020 4:50:28 PM Version By: Baruch Gouty RN, BSN Entered By: Lorrin Jackson on 09/15/2020 07:48:30 -------------------------------------------------------------------------------- [place] Details Patient Name: Date of Service: Harry Leventhal G. 09/12/2020 2:00 PM Medical Record Number: [id number] Patient Account Number: [id number] Date of Birth/Sex: Treating RN: January 06, 1945 (75 y.o. [name] Primary Care Declyn Delsol: [name] Other Clinician: Referring Darnel Mchan: Treating Jesselyn Rask/Extender: [name] in Treatment: 17 Vital Signs Time Taken: 14:30 Temperature (F): 98.5 Height (in): 71 Pulse (bpm): 63 Weight (lbs): 295 Respiratory Rate (breaths/min): 20 Body Mass Index (BMI): 41.1 Blood Pressure (mmHg): 136/88 Reference  Range: 80 - 120 mg / dl Electronic Signature(s) Signed: 09/12/2020 2:52:35 PM By: [name] Entered By: [name] on 09/12/2020 14:30:22

## 2020-09-14 ENCOUNTER — Other Ambulatory Visit: Payer: Self-pay

## 2020-09-14 ENCOUNTER — Inpatient Hospital Stay (HOSPITAL_BASED_OUTPATIENT_CLINIC_OR_DEPARTMENT_OTHER): Payer: Medicare Other | Admitting: Hematology

## 2020-09-14 VITALS — BP 95/60 | HR 69 | Temp 98.6°F | Resp 19 | Ht 71.0 in | Wt 294.8 lb

## 2020-09-14 DIAGNOSIS — I4891 Unspecified atrial fibrillation: Secondary | ICD-10-CM | POA: Diagnosis not present

## 2020-09-14 DIAGNOSIS — N1832 Chronic kidney disease, stage 3b: Secondary | ICD-10-CM | POA: Diagnosis not present

## 2020-09-14 DIAGNOSIS — M109 Gout, unspecified: Secondary | ICD-10-CM | POA: Diagnosis not present

## 2020-09-14 DIAGNOSIS — D472 Monoclonal gammopathy: Secondary | ICD-10-CM

## 2020-09-14 DIAGNOSIS — Z79899 Other long term (current) drug therapy: Secondary | ICD-10-CM | POA: Diagnosis not present

## 2020-09-16 DIAGNOSIS — I872 Venous insufficiency (chronic) (peripheral): Secondary | ICD-10-CM | POA: Diagnosis not present

## 2020-09-16 DIAGNOSIS — I504 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 failure: Secondary | ICD-10-CM | POA: Diagnosis not present

## 2020-09-16 DIAGNOSIS — I4819 Other persistent atrial fibrillation: Secondary | ICD-10-CM | POA: Diagnosis not present

## 2020-09-16 DIAGNOSIS — Z7901 Long term (current) use of anticoagulants: Secondary | ICD-10-CM | POA: Diagnosis not present

## 2020-09-19 ENCOUNTER — Encounter (HOSPITAL_BASED_OUTPATIENT_CLINIC_OR_DEPARTMENT_OTHER): Payer: Medicare Other | Admitting: Internal Medicine

## 2020-09-19 ENCOUNTER — Other Ambulatory Visit: Payer: Self-pay

## 2020-09-19 DIAGNOSIS — M1A9XX1 Chronic gout, unspecified, with tophus (tophi): Secondary | ICD-10-CM | POA: Diagnosis not present

## 2020-09-19 DIAGNOSIS — L97929 Non-pressure chronic ulcer of unspecified part of left lower leg with unspecified severity: Secondary | ICD-10-CM | POA: Diagnosis not present

## 2020-09-19 DIAGNOSIS — N184 Chronic kidney disease, stage 4 (severe): Secondary | ICD-10-CM | POA: Diagnosis not present

## 2020-09-19 DIAGNOSIS — I130 Hypertensive heart and chronic kidney disease with heart failure and stage 1 through stage 4 chronic kidney disease, or unspecified chronic kidney disease: Secondary | ICD-10-CM | POA: Diagnosis not present

## 2020-09-19 DIAGNOSIS — I4819 Other persistent atrial fibrillation: Secondary | ICD-10-CM | POA: Diagnosis not present

## 2020-09-19 DIAGNOSIS — I504 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 failure: Secondary | ICD-10-CM

## 2020-09-19 DIAGNOSIS — I872 Venous insufficiency (chronic) (peripheral): Secondary | ICD-10-CM | POA: Diagnosis not present

## 2020-09-19 DIAGNOSIS — M1990 Unspecified osteoarthritis, unspecified site: Secondary | ICD-10-CM | POA: Diagnosis not present

## 2020-09-19 DIAGNOSIS — Z8249 Family history of ischemic heart disease and other diseases of the circulatory system: Secondary | ICD-10-CM | POA: Diagnosis not present

## 2020-09-19 DIAGNOSIS — L97829 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ity: Secondary | ICD-10-CM

## 2020-09-19 DIAGNOSIS — Z7901 Long term (current) use of anticoagulants: Secondary | ICD-10-CM | POA: Diagnosis not present

## 2020-09-19 DIAGNOSIS — N189 Chronic kidney disease, unspecified: Secondary | ICD-10-CM | POA: Diagnosis not present

## 2020-09-20 NOTE — Progress Notes (Signed)
MERRIL, NAGY ([id number]) Visit Report for 09/19/2020 Chief Complaint Document Details Patient Name: Date of Service: Harry Foster 09/19/2020 2:30 PM Medical Record Number: [id number] Patient Account Number: [id number] Date of Birth/Sex: Treating RN: 11-Dec-1945 (75 y.o. [name] Primary Care Provider: [name] Other Clinician: Referring Provider: Treating Provider/Extender: [name] in Treatment: 18 Information Obtained from: Patient Chief Complaint Left lower extremity skin breakdown Electronic Signature(s) Signed: 09/19/2020 3:54:07 PM By: Kalman Shan DO Entered By: Kalman Shan on 09/19/2020 15:50:20 -------------------------------------------------------------------------------- HPI Details Patient Name: Date of Service: Harry Foster. 09/19/2020 2:30 PM Medical Record Number: [id number] Patient Account Number: [id number] Date of Birth/Sex: Treating RN: 1945-03-27 (75 y.o. [name] Primary Care Provider: [name] Other Clinician: Referring Provider: Treating Provider/Extender: [name] in Treatment: 18 History of Present Illness HPI Description: Harry Foster is a 75 year old male with a past medical history of persistent atrial fibrillation on Coumadin, hypertension, gout, CHF And venous insufficiency that presents to the clinic for bilateral lower extremity wounds. He states he has a history of blistering to his legs that eventually pop and heal. He states that in the past 6 weeks he has been unable to heal his wounds. He keeps them wrapped however they soak through quickly and has to change the wraps frequently. 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 He sleeps in a recliner at night and keeps his legs down due to increased pain when they are elevated. He states this symptom started a few weeks ago. He  denies signs of infection. 5/23; patient presents for 1 week follow-up. He had Kerlix/Coban wrap placed at last clinic visit and changed again last week due to increased drainage. He states that there has been an improvement in swelling and drainage over the past week. He is going to have arterial studies done tomorrow. He denies signs of infection. 5/31; patient presents for 1 week follow-up. He has been doing well with Kerlix/Coban wrap. He denies signs of infection. 6/7; patient presents for 1 week follow-up. He has been using Kerlix/Coban wraps bilaterally with collagen underneath. He has tolerated these well. He denies signs of infection. The right leg no longer has wounds. He has his [name] wraps today 6/14; the patient is effectively healed on the left leg. He already is wearing his [name] wraps on the right leg he has 20/30 stockings below this. He has a stocking for the left leg. He says he is going to go to elastic therapy for 20/30 stockings to put under the Farrow wraps 6/30; patient was discharged from the clinic 2 weeks ago for closed wounds. He reports using Farrow wraps bilaterally. He says he noticed in the past week he was developing new wounds. He denies signs of infection. 7/12; patient presents for follow-up. He has been tolerating the Kerlix/Coban well. When the wrap was taken off today he noticed a blister and this opened in the clinic. He denies signs of infection. 7/21. The patient arrives healed. Previously followed by Dr. Heber [place]. He has severe chronic venous insufficiency. The blister he noted last week on the left anterior leg is closed. He has Farrow wrap stockings he is familiar with these. I cannot really determine from him today whether he was actually using these when these wounds formed. Readmission 8/1 Patient presents again for skin breakdown to his left lower extremity. He was discharged less  than 2 weeks ago for a wound to his left lower extremity. He states  that on discharge the following day he had a blister that opened and popped. Its been healing since. He has been tightly wrapping his leg from the ankle up and placing a Farrow wrap over this. He currently denies signs of infection. 8/15; patient presents for 2-week follow-up. He states that his diuretic was increased recently by his cardiologist. He reports improvement to his lower extremity swelling. He states that the wounds are healed. Readmission 9/12 Patient presents again for left lower extremity wound limited to skin breakdown. He has been using Mepilex light dressings with antibiotic ointment. He continues to use his Farrow wrap. He is on 120 mg of Lasix daily. He denies signs of infection. 9/19; patient presents for follow-up. He has been using Mepilex light dressings with antibiotic ointment under Farrow wraps. He reports improvement in wound healing. He is scheduled for venous reflux studies this week. He has no issues or complaints today. He denies signs of infection. Electronic Signature(s) Signed: 09/19/2020 3:54:07 PM By: Kalman Shan DO Entered By: Kalman Shan on 09/19/2020 15:51:19 -------------------------------------------------------------------------------- Physical Exam Details Patient Name: Date of Service: Harry Foster 09/19/2020 2:30 PM Medical Record Number: [id number] Patient Account Number: [id number] Date of Birth/Sex: Treating RN: [birth year] (75 y.o. [name] Primary Care Provider: [name] Other Clinician: Referring Provider: Treating Provider/Extender: [name] in Treatment: 18 Constitutional respirations regular, non-labored and within target range for patient.. Cardiovascular 2+ dorsalis pedis/posterior tibialis pulses. Psychiatric pleasant and cooperative. Notes Left lower extremity: Small pinpoint areas of skin breakdown that is almost epithelialized. 2+ pitting edema to the knee. No  signs of infection. Electronic Signature(s) Signed: 09/19/2020 3:54:07 PM By: Kalman Shan DO Entered By: Kalman Shan on 09/19/2020 15:51:57 -------------------------------------------------------------------------------- Physician Orders Details Patient Name: Date of Service: [name] 09/19/2020 2:30 PM Medical Record Number: [id number] Patient Account Number: [id number] Date of Birth/Sex: Treating RN: [birth year] (75 y.o. 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 - with Dr. Heber [place] Return A Edema Control - Lymphedema / SCD / Other Elevate legs to the level of the heart or above for 30 minutes daily and/or when sitting, a frequency of: Avoid standing for long periods of time. Moisturize legs daily. Compression stocking or Garment 20-30 mm/Hg pressure to: - Patient has Juxtalite Compression garment Additional Orders / Instructions Follow Nutritious Diet Wound Treatment Wound #8 - Lower Leg Wound Laterality: Left, Anterior Cleanser: Soap and Water Every Other Day/15 Days Discharge Instructions: May shower and wash wound with dial antibacterial soap and water prior to dressing change. Cleanser: Wound Cleanser Every Other Day/15 Days Discharge Instructions: Cleanse the wound with  wound cleanser prior to applying a clean dressing using gauze sponges, not tissue or cotton balls. Topical: Antibiotic Ointment Every Other Day/15 Days Secondary Dressing: Mepilex Lite Silicone Border 4x4 Every Other Day/15 Days Electronic Signature(s) Signed: 09/19/2020 3:54:07 PM By: Heber Nicholls,  Janett Billow DO Entered By: Kalman Shan on 09/19/2020 15:52:14 -------------------------------------------------------------------------------- Problem List Details Patient Name: Date of Service: Harry Foster 09/19/2020 2:30 PM Medical Record Number: [id number] Patient Account Number: [id number] Date of Birth/Sex: Treating RN: September 04, 1945 (75 y.o. [name] Primary Care Provider: [name] Other Clinician: Referring Provider: Treating Provider/Extender: [name] in Treatment: 18 Active Problems ICD-10 Encounter Code Description Active Date MDM Diagnosis L97.829 Non-pressure chronic ulcer of other part of left lower leg with unspecified 05/16/2020 No Yes severity I87.2 Venous insufficiency (chronic) (peripheral) 05/16/2020 No Yes I48.19 Other persistent atrial fibrillation 05/16/2020 No Yes Z79.01 Long term (current) use of anticoagulants 05/16/2020 No Yes I50.40 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 05/16/2020 No Yes failure M1A.9XX1 Chronic gout, unspecified, with tophus (tophi) 05/16/2020 No Yes N18.4 Chronic kidney disease, stage 4 (severe) 05/16/2020 No Yes Inactive Problems Resolved Problems ICD-10 Code Description Active Date Resolved Date L97.919 Non-pressure chronic ulcer of unspecified part of right lower leg with unspecified 05/16/2020 05/16/2020 severity Electronic Signature(s) Signed: 09/19/2020 3:54:07 PM By: Kalman Shan DO Entered By: Kalman Shan on 09/19/2020 15:50:02 -------------------------------------------------------------------------------- Progress Note  Details Patient Name: Date of Service: Harry Foster. 09/19/2020 2:30 PM Medical Record Number: [id number] Patient Account Number: [id number] Date of Birth/Sex: Treating RN: 03-05-1945 (75 y.o. [name] Primary Care Provider: [name] Other Clinician: Referring Provider: Treating Provider/Extender: [name] in Treatment: 18 Subjective Chief Complaint Information obtained from Patient Left lower extremity skin breakdown History of Present Illness (HPI) Mr. Harry Foster is a 75 year old male with a past medical history of persistent atrial fibrillation on Coumadin, hypertension, gout, CHF And venous insufficiency that presents to the clinic for bilateral lower extremity wounds. He states he has a history of blistering to his legs that eventually pop and heal. He states that in the past 6 weeks he has been unable to heal his wounds. He keeps them wrapped however they soak through quickly and has to change the wraps frequently. 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 He sleeps in a recliner at night and keeps his legs down due to increased pain when they are elevated. He states this symptom started a few weeks ago. He denies signs of infection. 5/23; patient presents for 1 week follow-up. He had Kerlix/Coban wrap placed at last clinic visit and changed again last week due to increased drainage. He states that there has been an improvement in swelling and drainage over the past week. He is going to have arterial studies done tomorrow. He denies signs of infection. 5/31; patient presents for 1 week follow-up. He has been doing well with Kerlix/Coban wrap. He denies signs of infection. 6/7; patient presents for 1 week follow-up. He has been using Kerlix/Coban wraps bilaterally with collagen underneath. He has tolerated these well. He denies signs of infection. The right leg no longer has wounds. He  has his [name] wraps today 6/14; the patient is effectively healed on the left leg. He already is wearing his [name] wraps on the right leg he has 20/30 stockings below this. He has a stocking for the left leg. He says he is going to go to elastic therapy for 20/30 stockings to put under the Farrow wraps 6/30; patient was discharged from the clinic 2 weeks ago for closed wounds. He reports using Farrow wraps bilaterally. He says he noticed in the past week he  was developing new wounds. He denies signs of infection. 7/12; patient presents for follow-up. He has been tolerating the Kerlix/Coban well. When the wrap was taken off today he noticed a blister and this opened in the clinic. He denies signs of infection. 7/21. The patient arrives healed. Previously followed by Dr. Heber [place]. He has severe chronic venous insufficiency. The blister he noted last week on the left anterior leg is closed. He has Farrow wrap stockings he is familiar with these. I cannot really determine from him today whether he was actually using these when these wounds formed. Readmission 8/1 Patient presents again for skin breakdown to his left lower extremity. He was discharged less than 2 weeks ago for a wound to his left lower extremity. He states that on discharge the following day he had a blister that opened and popped. Its been healing since. He has been tightly wrapping his leg from the ankle up and placing a Farrow wrap over this. He currently denies signs of infection. 8/15; patient presents for 2-week follow-up. He states that his diuretic was increased recently by his cardiologist. He reports improvement to his lower extremity swelling. He states that the wounds are healed. Readmission 9/12 Patient presents again for left lower extremity wound limited to skin breakdown. He has been using Mepilex light dressings with antibiotic ointment. He continues to use his Farrow wrap. He is on 120 mg of Lasix daily. He denies  signs of infection. 9/19; patient presents for follow-up. He has been using Mepilex light dressings with antibiotic ointment under Farrow wraps. He reports improvement in wound healing. He is scheduled for venous reflux studies this week. He has no issues or complaints today. He denies signs of infection. Patient History Information obtained from Patient. Family History Cancer - Mother,Siblings, Heart Disease - Father, No family history of Diabetes, Hereditary Spherocytosis, Hypertension, Kidney Disease, Lung Disease, Seizures, Stroke, Thyroid Problems, Tuberculosis. Social History Never smoker, Marital Status - Widowed, Alcohol Use - Never, Drug Use - No History, Caffeine Use - Daily - coffee. Medical History Eyes Patient has history of Cataracts - mild Denies history of Glaucoma, Optic Neuritis Ear/Nose/Mouth/Throat Denies history of Chronic sinus problems/congestion, Middle ear problems Cardiovascular Patient has history of Arrhythmia - afib, Congestive Heart Failure, Hypertension, Peripheral Venous Disease Endocrine Denies history of Type I Diabetes, Type II Diabetes Genitourinary Denies history of End Stage Renal Disease Integumentary (Skin) Denies history of History of Burn Musculoskeletal Patient has history of Gout, Osteoarthritis Denies history of Rheumatoid Arthritis, Osteomyelitis Oncologic Denies history of Received Chemotherapy, Received Radiation Psychiatric Denies history of Anorexia/bulimia, Confinement Anxiety Hospitalization/Surgery History - bil knee replacements. Medical A Surgical History Notes nd Constitutional Symptoms (General Health) morbid obesity Genitourinary CKD stage 3 Objective Constitutional respirations regular, non-labored and within target range for patient.. Vitals Time Taken: 2:35 PM, Height: 71 in, Weight: 295 lbs, BMI: 41.1, Temperature: 98.7 F, Pulse: 74 bpm, Respiratory Rate: 17 breaths/min, Blood Pressure: 100/60  mmHg. Cardiovascular 2+ dorsalis pedis/posterior tibialis pulses. Psychiatric pleasant and cooperative. General Notes: Left lower extremity: Small pinpoint areas of skin breakdown that is almost epithelialized. 2+ pitting edema to the knee. No signs of infection. Integumentary (Hair, Skin) Wound #8 status is Open. Original cause of wound was Blister. The date acquired was: 09/06/2020. The wound has been in treatment 1 weeks. The wound is located on the Left,Anterior Lower Leg. There is Fat Layer (Subcutaneous Tissue) exposed. There is a medium amount of serous drainage noted. The wound margin is distinct with the outline  attached to the wound base. There is large (67-100%) red, pink granulation within the wound bed. There is no necrotic tissue within the wound bed. 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 I recommended continuing Mepilex light border dressing with antibiotic ointment under Farrow wrap. He is scheduled for venous reflux studies this week per patient. Follow-up in 1 week to go over results and hopefully wound is closed. Plan Follow-up Appointments: Return Appointment in 1 week. - with Dr. Heber [place] Edema Control - Lymphedema / SCD / Other: Elevate legs to the level of the heart or above for 30 minutes daily and/or when sitting, a frequency of: Avoid standing for long periods of time. Moisturize legs daily. Compression stocking or Garment 20-30 mm/Hg pressure to: - Patient has Juxtalite Compression garment Additional Orders / Instructions: Follow Nutritious Diet WOUND #8: -  Lower Leg Wound Laterality: Left, Anterior Cleanser: Soap and Water Every Other Day/15 Days Discharge Instructions: May shower and wash wound with dial antibacterial soap and water prior to dressing change. Cleanser: Wound Cleanser Every Other Day/15 Days Discharge Instructions: Cleanse the wound with wound cleanser prior to applying a clean dressing using gauze sponges, not tissue or cotton balls. Topical: Antibiotic Ointment Every Other Day/15 Days Secondary Dressing: Mepilex Lite Silicone Border 4x4 Every Other Day/15 Days 1. Continue Mepilex under for wrap 2. Venous reflux studies 3. Follow-up in 1 week Electronic Signature(s) Signed: 09/19/2020 3:54:07 PM By: Kalman Shan DO Entered By: Kalman Shan on 09/19/2020 15:53:31 -------------------------------------------------------------------------------- HxROS Details Patient Name: Date of Service: [name] 09/19/2020 2:30 PM Medical Record Number: [id number] Patient Account Number: [id number] Date of Birth/Sex: Treating RN: [birth year] (75 y.o. [name] Primary Care Provider: [name] Other Clinician: Referring Provider: Treating Provider/Extender: [name] in Treatment: 18 Information Obtained From Patient Constitutional Symptoms (General Health) Medical History: Past Medical History Notes: morbid obesity Eyes Medical History: Positive for: Cataracts - mild Negative for: Glaucoma; Optic Neuritis Ear/Nose/Mouth/Throat Medical History: Negative for: Chronic sinus problems/congestion; Middle ear problems Cardiovascular Medical History: Positive for: Arrhythmia - afib; Congestive Heart Failure; Hypertension; Peripheral Venous Disease Endocrine Medical History: Negative for: Type I Diabetes; Type II Diabetes Genitourinary Medical History: Negative for: End Stage Renal Disease Past Medical History Notes: CKD stage 3 Integumentary (Skin) Medical  History: Negative for: History of Burn Musculoskeletal Medical History: Positive for: Gout; Osteoarthritis Negative for: Rheumatoid Arthritis; Osteomyelitis Oncologic Medical History: Negative for: Received Chemotherapy; Received Radiation Psychiatric Medical History: Negative for: Anorexia/bulimia; Confinement Anxiety HBO Extended History Items Eyes: Cataracts Immunizations Pneumococcal Vaccine: Received Pneumococcal Vaccination: Yes Received Pneumococcal Vaccination On or After 60th Birthday: No Implantable Devices No devices added Hospitalization / Surgery History Type of Hospitalization/Surgery bil knee replacements Family and Social History Cancer: Yes - Mother,Siblings; Diabetes: No; Heart Disease: Yes - Father; Hereditary Spherocytosis: No; Hypertension: No; Kidney Disease: No; Lung Disease: No; Seizures: No; Stroke: No; Thyroid Problems: No; Tuberculosis: No; Never smoker; Marital Status - Widowed; Alcohol Use: Never; Drug Use: No History; Caffeine Use: Daily - coffee; Financial Concerns: No; Food, Clothing or Shelter Needs: No; Support System Lacking: No; Transportation Concerns: No Electronic Signature(s) Signed: 09/19/2020 3:54:07 PM By: Kalman Shan DO Signed: 09/19/2020 5:01:42  PM By: Lorrin Jackson Entered By: Kalman Shan on 09/19/2020 15:51:26 -------------------------------------------------------------------------------- SuperBill Details Patient Name: Date of Service: Harry Foster 09/19/2020 Medical Record Number: [id number] Patient Account Number: [id number] Date of Birth/Sex: Treating RN: 09-09-1945 (75 y.o. 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 failure M1A.9XX1 Chronic gout, unspecified, 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, stage 4 (severe) Facility Procedures CPT4 Code: [id number] Description: 80223 - WOUND CARE VISIT-LEV 3 EST PT Modifier: Quantity: 1 Physician Procedures : CPT4 Code Description Modifier [id number] - WC PHYS LEVEL 3 - EST PT ICD-10 Diagnosis Description L97.829 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ity I87.2 Venous insufficiency (chronic) (peripheral) N18.4  Chronic kidney disease, stage 4 (severe) I50.40 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 failure Quantity: 1 Electronic Signature(s) Signed: 09/19/2020 4:56:29 PM By: Kalman Shan DO Signed: 09/20/2020 6:09:57 PM By: Rhae Hammock RN Previous Signature: 09/19/2020 3:54:07 PM Version By: Kalman Shan DO Entered By: Rhae Hammock on 09/19/2020 16:02:37

## 2020-09-20 NOTE — Progress Notes (Signed)
HOLLAND, NICKSON ([id number]) Visit Report for 09/19/2020 Arrival Information Details Patient Name: Date of Service: Harry Foster 09/19/2020 2:30 PM Medical Record Number: [id number] Patient Account Number: [id number] Date of Birth/Sex: Treating RN: [birth year] (75 y.o. [name] Primary Care Enyah Moman: [name] Other Clinician: Referring Naya Ilagan: Treating Erlean Mealor/Extender: [name] in Treatment: 18 Visit Information History Since Last Visit All ordered tests and consults were completed: No Patient Arrived: Ambulatory Added or deleted any medications: No Arrival Time: 14:33 Any new allergies or adverse reactions: No Accompanied By: Self Had a fall or experienced change in No Transfer Assistance: None activities of daily living that may affect Patient Identification Verified: Yes risk of falls: Secondary Verification Process Completed: Yes Signs or symptoms of abuse/neglect since last visito No Patient Requires Transmission-Based Precautions: No Hospitalized since last visit: No Patient Has Alerts: Yes Implantable device outside of the clinic excluding No Patient Alerts: R ABI: 1.63 TBI: 1.36 cellular tissue based products placed in the center L ABI: 1.49 TBI: 1.34 since last visit: 05/2020 Pain Present Now: No Electronic Signature(s) Signed: 09/20/2020 4:02:45 PM By: [name] Entered By: [name] on 09/19/2020 14:37:35 -------------------------------------------------------------------------------- Clinic Level of Care Assessment Details Patient Name: Date of Service: Harry Foster 09/19/2020 2:30 PM Medical Record Number: [id number] Patient Account Number: [id number] Date of Birth/Sex: Treating RN: 1945/06/21 (75 y.o. [name], [name] Primary Care Leighton Brickley: [name] Other Clinician: Referring Raghav Verrilli: Treating Rodric Punch/Extender: [name] in  Treatment: 18 Clinic Level of Care Assessment Items TOOL 4 Quantity Score X- 1 0 Use when only an EandM is performed on FOLLOW-UP visit ASSESSMENTS - Nursing Assessment / Reassessment X- 1 10 Reassessment of Co-morbidities (includes updates in patient status) X- 1 5 Reassessment of Adherence to Treatment Plan ASSESSMENTS - Wound and Skin A ssessment / Reassessment X - Simple Wound Assessment / Reassessment - one wound 1 5 []  - 0 Complex Wound Assessment / Reassessment - multiple wounds X- 1 10 Dermatologic / Skin Assessment (not related to wound area) ASSESSMENTS - Focused Assessment X- 1 5 Circumferential Edema Measurements - multi extremities []  - 0 Nutritional Assessment / Counseling / Intervention []  - 0 Lower Extremity Assessment (monofilament, tuning fork, pulses) []  - 0 Peripheral Arterial Disease Assessment (using hand held doppler) ASSESSMENTS - Ostomy and/or Continence Assessment and Care []  - 0 Incontinence Assessment and Management []  - 0 Ostomy Care Assessment and Management (repouching, etc.) PROCESS - Coordination of Care X - Simple Patient / Family Education for ongoing care 1 15 []  - 0 Complex (extensive) Patient / Family Education for ongoing care X- 1 10 Staff obtains Programmer, systems, Records, T Results / Process Orders est []  - 0 Staff telephones HHA, Nursing Homes / Clarify orders / etc []  - 0 Routine Transfer to another Facility (non-emergent condition) []  - 0 Routine Hospital Admission (non-emergent condition) []  - 0 New Admissions / Biomedical engineer / Ordering NPWT Apligraf, etc. , []  - 0 Emergency Hospital Admission (emergent condition) X- 1 10 Simple Discharge Coordination []  - 0 Complex (extensive) Discharge Coordination PROCESS - Special Needs []  - 0 Pediatric / Minor Patient Management []  - 0 Isolation Patient Management []  - 0 Hearing / Language / Visual special needs []  - 0 Assessment of Community assistance (transportation,  D/C planning, etc.) []  - 0 Additional assistance / Altered mentation []  - 0 Support Surface(s) Assessment (bed, cushion, seat, etc.) INTERVENTIONS - Wound Cleansing / Measurement X -  Simple Wound Cleansing - one wound 1 5 []  - 0 Complex Wound Cleansing - multiple wounds X- 1 5 Wound Imaging (photographs - any number of wounds) []  - 0 Wound Tracing (instead of photographs) X- 1 5 Simple Wound Measurement - one wound []  - 0 Complex Wound Measurement - multiple wounds INTERVENTIONS - Wound Dressings X - Small Wound Dressing one or multiple wounds 1 10 []  - 0 Medium Wound Dressing one or multiple wounds []  - 0 Large Wound Dressing one or multiple wounds []  - 0 Application of Medications - topical []  - 0 Application of Medications - injection INTERVENTIONS - Miscellaneous []  - 0 External ear exam []  - 0 Specimen Collection (cultures, biopsies, blood, body fluids, etc.) []  - 0 Specimen(s) / Culture(s) sent or taken to Lab for analysis []  - 0 Patient Transfer (multiple staff / Civil Service fast streamer / Similar devices) []  - 0 Simple Staple / Suture removal (25 or less) []  - 0 Complex Staple / Suture removal (26 or more) []  - 0 Hypo / Hyperglycemic Management (close monitor of Blood Glucose) []  - 0 Ankle / Brachial Index (ABI) - do not check if billed separately X- 1 5 Vital Signs Has the patient been seen at the hospital within the last three years: Yes Total Score: 100 Level Of Care: New/Established - Level 3 Electronic Signature(s) Signed: 09/20/2020 6:09:57 PM By: Rhae Hammock RN Entered By: Rhae Hammock on 09/19/2020 16:02:11 -------------------------------------------------------------------------------- Encounter Discharge Information Details Patient Name: Date of Service: Harry Foster. 09/19/2020 2:30 PM Medical Record Number: [id number] Patient Account Number: [id number] Date of Birth/Sex: Treating RN: 10-29-1945 (75 y.o. [name], [name] Primary Care  Salih Williamson: [name] Other Clinician: Referring Pasha Broad: Treating Christobal Morado/Extender: [name] in Treatment: 18 Encounter Discharge Information Items Discharge Condition: Stable Ambulatory Status: Ambulatory Discharge Destination: Home Transportation: Private Auto Accompanied By: self Schedule Follow-up Appointment: Yes Clinical Summary of Care: Patient Declined Electronic Signature(s) Signed: 09/20/2020 6:09:57 PM By: Rhae Hammock RN Entered By: Rhae Hammock on 09/19/2020 16:07:16 -------------------------------------------------------------------------------- Lower Extremity Assessment Details Patient Name: Date of Service: Harry Foster. 09/19/2020 2:30 PM Medical Record Number: [id number] Patient Account Number: [id number] Date of Birth/Sex: Treating RN: 1945-08-03 (75 y.o. [name] Primary Care Merik Mignano: [name] Other Clinician: Referring Morley Gaumer: Treating Britanni Yarde/Extender: [name] in Treatment: 18 Edema Assessment Assessed: Shirlyn Goltz: Yes] Patrice Paradise: No] Edema: [Left: Ye] [Right: s] Calf Left: Right: Point of Measurement: 31 cm From Medial Instep 42 cm Ankle Left: Right: Point of Measurement: 10 cm From Medial Instep 25.5 cm Vascular Assessment Pulses: Dorsalis Pedis Palpable: [Left:Yes] Posterior Tibial Palpable: [Left:Yes] Electronic Signature(s) Signed: 09/19/2020 5:01:42 PM By: Lorrin Jackson Signed: 09/20/2020 4:02:45 PM By: [name] Entered By: [name] on 09/19/2020 14:40:58 -------------------------------------------------------------------------------- Multi Wound Chart Details Patient Name: Date of Service: Harry Foster. 09/19/2020 2:30 PM Medical Record Number: [id number] Patient Account Number: [id number] Date of Birth/Sex: Treating RN: 11-Dec-1945 (75 y.o. [name] Primary Care Dhilan Brauer: [name] Other Clinician: Referring Hyman Crossan: Treating Landrey Mahurin/Extender: [name] in Treatment: 18 Vital Signs Height(in): 71 Pulse(bpm): 53 Weight(lbs): 151 Blood Pressure(mmHg): 100/60 Body Mass Index(BMI): 41 Temperature(F): 98.7 Respiratory Rate(breaths/min): 17 Photos: [N/A:N/A] Left, Anterior Lower Leg N/A N/A Wound Location: Blister N/A N/A Wounding Event: Venous Leg Ulcer N/A N/A Primary Etiology: Cataracts, Arrhythmia, Congestive N/A N/A Comorbid History: Heart Failure, Hypertension, Peripheral Venous Disease, Gout, Osteoarthritis 09/06/2020 N/A N/A  Date Acquired: 1 N/A N/A Weeks of Treatment: Open N/A N/A Wound Status: Full Thickness Without Exposed N/A N/A Classification: Support Structures Medium N/A N/A Exudate Amount: Serous N/A N/A Exudate Type: amber N/A N/A Exudate Color: Distinct, outline attached N/A N/A Wound Margin: Large (67-100%) N/A N/A Granulation Amount: Red, Pink N/A N/A Granulation Quality: None Present (0%) N/A N/A Necrotic Amount: Fat Layer (Subcutaneous Tissue): Yes N/A N/A Exposed Structures: Fascia: No Tendon: No Muscle: No Joint: No Bone: No None N/A N/A Epithelialization: Treatment Notes Electronic Signature(s) Signed: 09/19/2020 3:54:07 PM By: Kalman Shan DO Signed: 09/19/2020 5:01:42 PM By: Lorrin Jackson Entered By: Kalman Shan on 09/19/2020 15:50:09 -------------------------------------------------------------------------------- Multi-Disciplinary Care Plan Details Patient Name: Date of Service: Harry Foster. 09/19/2020 2:30 PM Medical Record Number: [id number] Patient Account Number: [id number] Date of Birth/Sex: Treating RN: 04-23-1945 (75 y.o. [name], [name] Primary Care Eldoris Beiser: [name] Other Clinician: Referring Clemmie Buelna: Treating Anmol Paschen/Extender: [name] in Treatment: 18 Multidisciplinary Care  Plan reviewed with physician Active Inactive Venous Leg Ulcer Nursing Diagnoses: Actual venous Insuffiency (use after diagnosis is confirmed) Knowledge deficit related to disease process and management Goals: Patient will maintain optimal edema control Date Initiated: 05/16/2020 Target Resolution Date: 09/22/2020 Goal Status: Active Patient/caregiver will verbalize understanding of disease process and disease management Date Initiated: 05/16/2020 Date Inactivated: 07/21/2020 Target Resolution Date: 08/05/2020 Goal Status: Met Interventions: Assess peripheral edema status every visit. Compression as ordered Provide education on venous insufficiency Notes: Wound/Skin Impairment Nursing Diagnoses: Impaired tissue integrity Knowledge deficit related to ulceration/compromised skin integrity Goals: Patient/caregiver will verbalize understanding of skin care regimen Date Initiated: 05/16/2020 Date Inactivated: 08/15/2020 Target Resolution Date: 09/02/2020 Goal Status: Met Ulcer/skin breakdown will have a volume reduction of 30% by week 4 Date Initiated: 05/16/2020 Target Resolution Date: 09/25/2020 Goal Status: Active Interventions: Assess patient/caregiver ability to obtain necessary supplies Assess patient/caregiver ability to perform ulcer/skin care regimen upon admission and as needed Assess ulceration(s) every visit Provide education on ulcer and skin care Notes: 08/15/20: Healed Electronic Signature(s) Signed: 09/20/2020 6:09:57 PM By: Rhae Hammock RN Entered By: Rhae Hammock on 09/19/2020 15:01:17 -------------------------------------------------------------------------------- Pain Assessment Details Patient Name: Date of Service: Harry Foster 09/19/2020 2:30 PM Medical Record Number: [id number] Patient Account Number: [id number] Date of Birth/Sex: Treating RN: 1945-07-24 (75 y.o. [name] Primary Care Carri Spillers: [name] Other  Clinician: Referring Marten Iles: Treating Yug Loria/Extender: [name] in Treatment: 18 Active Problems Location of Pain Severity and Description of Pain Patient Has Paino No Site Locations Pain Management and Medication Current Pain Management: Electronic Signature(s) Signed: 09/19/2020 5:01:42 PM By: Lorrin Jackson Signed: 09/20/2020 4:02:45 PM By: [name] Entered By: [name] on 09/19/2020 14:38:25 -------------------------------------------------------------------------------- Patient/Caregiver Education Details Patient Name: Date of Service: Harry Foster 9/19/2022andnbsp2:30 PM Medical Record Number: [id number] Patient Account Number: [id number] Date of Birth/Gender: Treating RN: 12-May-1945 (75 y.o. Erie Noe Primary Care Physician: [name] Other Clinician: Referring Physician: Treating Physician/Extender: [name] in Treatment: 18 Education Assessment Education Provided To: Patient Education Topics Provided Venous: Handouts: Controlling Swelling with Compression Stockings Methods: Explain/Verbal Responses: Reinforcements needed, State content correctly Wound/Skin Impairment: Handouts: Caring for Your Ulcer Methods: Explain/Verbal Responses: Reinforcements needed, State content correctly Electronic Signature(s) Signed: 09/20/2020 6:09:57 PM By: Rhae Hammock RN Entered By: Rhae Hammock on 09/19/2020 16:00:40 -------------------------------------------------------------------------------- Wound Assessment Details Patient Name: Date of Service: Harry Leventhal G. 09/19/2020 2:30 PM Medical  Record Number: [id number] Patient Account Number: [id number] Date of Birth/Sex: Treating RN: 02/09/1945 (75 y.o. [name] Primary Care Nathaneal Sommers: [name] Other Clinician: Referring Jackob Crookston: Treating Jannessa Ogden/Extender: [name] in Treatment: 18 Wound Status Wound Number: 8 Primary Venous Leg Ulcer Etiology: Wound Location: Left, Anterior Lower Leg Wound Open Wounding Event: Blister Status: Date Acquired: 09/06/2020 Comorbid Cataracts, Arrhythmia, Congestive Heart Failure, Hypertension, Weeks Of Treatment: 1 History: Peripheral Venous Disease, Gout, Osteoarthritis Clustered Wound: No Photos Wound Measurements % Reduction in Area: % Reduction in Volume: Epithelialization: None Wound Description Classification: Full Thickness Without Exposed Support Structures Wound Margin: Distinct, outline attached Exudate Amount: Medium Exudate Type: Serous Exudate Color: amber Foul Odor After Cleansing: No Slough/Fibrino No Wound Bed Granulation Amount: Large (67-100%) Exposed Structure Granulation Quality: Red, Pink Fascia Exposed: No Necrotic Amount: None Present (0%) Fat Layer (Subcutaneous Tissue) Exposed: Yes Tendon Exposed: No Muscle Exposed: No Joint Exposed: No Bone Exposed: No Treatment Notes Wound #8 (Lower Leg) Wound Laterality: Left, Anterior Cleanser Soap and Water Discharge Instruction: May shower and wash wound with dial antibacterial soap and water prior to dressing change. Wound Cleanser Discharge Instruction: Cleanse the wound with wound cleanser prior to applying a clean dressing using gauze sponges, not tissue or cotton balls. Peri-Wound Care Topical Antibiotic Ointment Primary Dressing Secondary Dressing Mepilex Lite Silicone Border 4x4 Secured With Compression Wrap Compression Stockings Add-Ons Electronic Signature(s) Signed: 09/19/2020 5:01:42 PM By: Lorrin Jackson Signed: 09/20/2020 4:02:45 PM By: [name] Entered By: [name] on 09/19/2020 14:43:01 -------------------------------------------------------------------------------- [place] Details Patient Name: Date of Service: Harry Foster. 09/19/2020 2:30 PM Medical  Record Number: [id number] Patient Account Number: [id number] Date of Birth/Sex: Treating RN: 1945/05/11 (75 y.o. [name] Primary Care Orion Mole: [name] Other Clinician: Referring Tarnisha Kachmar: Treating Kaemon Barnett/Extender: [name] in Treatment: 18 Vital Signs Time Taken: 14:35 Temperature (F): 98.7 Height (in): 71 Pulse (bpm): 74 Weight (lbs): 295 Respiratory Rate (breaths/min): 17 Body Mass Index (BMI): 41.1 Blood Pressure (mmHg): 100/60 Reference Range: 80 - 120 mg / dl Electronic Signature(s) Signed: 09/20/2020 4:02:45 PM By: [name] Entered By: [name] on 09/19/2020 14:38:00

## 2020-09-21 ENCOUNTER — Other Ambulatory Visit (HOSPITAL_COMMUNITY): Payer: Self-pay | Admitting: Internal Medicine

## 2020-09-21 DIAGNOSIS — L97929 Non-pressure chronic ulcer of unspecified part of left lower leg with unspecified severity: Secondary | ICD-10-CM

## 2020-09-21 DIAGNOSIS — I83019 Varicose veins of right lower extremity with ulcer of unspecified site: Secondary | ICD-10-CM

## 2020-09-21 NOTE — Progress Notes (Signed)
HEMATOLOGY/ONCOLOGY CLINIC NOTE  Date of Service: .09/14/2020   Patient Care Team: Josetta Huddle, MD as PCP - General (Internal Medicine) Belva Crome, MD as PCP - Cardiology (Cardiology)  CHIEF COMPLAINTS/PURPOSE OF CONSULTATION:  Elevated M Spike  HISTORY OF PRESENTING ILLNESS:   Harry Foster is a wonderful 75 y.o. male who has been referred to [place] by Dr. Joylene Grapes for evaluation and management of M Spike. The pt reports that he is doing well overall.   The pt reports that he has Afib, which is being managed with Warfarin by Dr. Daneen Schick. His HTN has been under control. Pt is being followed by Dr. Kathlene November for Gout which is not well-controlled. Pt reports finger stiffness and mobility challenges in the last 15 months. Pt has been avoiding sugary liquids and red meats. His uric acid is currently greater than 11. He is considering starting Pegloticase to control his Gout. Pt has been dealing with renal dysfunction for the last 10 years.   Most recent lab results (09/23/2019) of CBC w/diff & CMP is as follows: all values are WNL except for BUN at 48, Creatinine at 2.40, GFR Est Non Af Am at 26. 09/23/2019 SPEP shows all values are WNL except for M Spike at 0.8 09/23/2019 Kappa light chains at 34.4, Lambda light chans at 42.8, K/L light chain ratio at 0.8  On review of systems, pt reports joint pain/stiffness and denies new bone pain, abdominal pain, constipation, dysuria and any other symptoms.   On PMHx the pt reports Arthritis, Atrial fibrillation, HTN, Gout, Cardioversion, Stage 3b CKD.  On Family Hx the pt reports that his mother had breast and cervical cancer in her 33's and his sister had breast cancer in her 32-50's.   INTERVAL HISTORY:  Harry Foster is a wonderful 75 y.o. male who is here for evaluation and management of M Spike. The patient's last visit with [place] was on 03/2020. The pt reports that he is doing well overall.  The pt reports that he has no acute n new  symptoms. Gout stable. No fevers no chills no night sweats no unexpected sudden weight loss.  Lab results from 09/07/2020 CBC within normal limits other than a platelet of 146k, CMP shows chronic kidney disease with a creatinine of 2.69 otherwise with stable labs. 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  M spike is stable was 0.8 just under 1 year ago.  On review of systems, pt denies new bone pains, back pain, abdominal pain, worsening gout, acute swelling, SOB, and any other symptoms  MEDICAL HISTORY:  Past Medical History:  Diagnosis Date   Arthritis    both knees and hands and left elbow, hip   Depression    treated for depression when wife was very ill   Diverticulosis    hx - no sx   Dysrhythmia    hx A fib, on anticoagulants   GI bleed 2010   GI bleed    hx   Hypertension    Recurrent upper respiratory infection (URI)    completing treatment for sinus infection - with abx    SURGICAL HISTORY: Past Surgical History:  Procedure Laterality Date   CARDIOVERSION  02/01/2011   Procedure: CARDIOVERSION;  Surgeon: Sinclair Grooms, MD;  Location[facility] OR;  Service: Cardiovascular;  Laterality: N/A;   SINUS EXPLORATION  1992   laser sinus surgery   TONSILLECTOMY  age 70   TOTAL KNEE ARTHROPLASTY  10/09/2011   Procedure: TOTAL KNEE ARTHROPLASTY;  Surgeon: Hessie Dibble, MD;  Location: [facility];  Service: Orthopedics;  Laterality: Left;  left total knee arthroplasty   TOTAL KNEE ARTHROPLASTY Right 10/06/2013   DR DALLDORF   TOTAL KNEE ARTHROPLASTY WITH REVISION COMPONENTS Right 10/06/2013   Procedure: TOTAL KNEE ARTHROPLASTY WITH REVISION COMPONENTS;  Surgeon: Hessie Dibble, MD;  Location: [facility];  Service: Orthopedics;  Laterality: Right;   TREATMENT FISTULA ANAL  1992    SOCIAL HISTORY: Social History   Socioeconomic History   Marital status: Widowed    Spouse name: Not on file   Number of children: Not on file    Years of education: Not on file   Highest education level: Not on file  Occupational History   Not on file  Tobacco Use   Smoking status: Never   Smokeless tobacco: Never  Substance and Sexual Activity   Alcohol use: No   Drug use: No   Sexual activity: Not on file  Other Topics Concern   Not on file  Social History Narrative   Not on file   Social Determinants of Health   Financial Resource Strain: Not on file  Food Insecurity: Not on file  Transportation Needs: Not on file  Physical Activity: Not on file  Stress: Not on file  Social Connections: Not on file  Intimate Partner Violence: Not on file    FAMILY HISTORY: Family History  Problem Relation Age of Onset   Heart disease Mother    Heart disease Father     ALLERGIES:  is allergic to hydrochlorothiazide, levaquin [levofloxacin in d5w], lisinopril, and quinolones.  MEDICATIONS:  Current Outpatient Medications  Medication Sig Dispense Refill   allopurinol (ZYLOPRIM) 100 MG tablet Take 200 mg by mouth daily.     amoxicillin (AMOXIL) 500 MG capsule 1 capsule     ascorbic acid (VITAMIN C) 1000 MG tablet Take 1,000 mg by mouth daily.     aspirin 81 MG EC tablet Take 81 mg by mouth daily. Swallow whole.     chlorhexidine (PERIDEX) 0.12 % solution SMARTSIG:By Mouth     Coenzyme Q10 (CO Q 10) 100 MG CAPS Take 100 mg by mouth daily.     colchicine 0.6 MG tablet Take 0.6 mg by mouth 3 (three) times a week.     DHEA 50 MG TABS Take 50 mg by mouth daily.     digoxin (LANOXIN) 0.125 MG tablet TAKE 1 TABLET BY MOUTH EVERY MONDAY, WEDNESDAY, AND FRIDAY. 45 tablet 2   furosemide (LASIX) 40 MG tablet Take 3 tablets (120 mg total) by mouth daily. 90 tablet 3   Lactobacillus (ACIDOPHILUS/BIFIDUS PO) Take 100 mg by mouth daily.     Loperamide HCl (IMODIUM PO) Take 1 tablet by mouth daily as needed (loose stools).      magnesium gluconate (MAGONATE) 500 MG tablet Take 250 mg by mouth daily.     metoprolol succinate (TOPROL-XL) 50  MG 24 hr tablet TAKE 2 TABLETS BY MOUTH 2 TIMES DAILY. TAKE WITH OR IMMEDIATELY FOLLOWING A MEAL. 180 tablet 3   Misc Natural Products (TART CHERRY ADVANCED) CAPS Take 2 capsules by mouth daily.     Multiple Vitamin (MULTIVITAMIN WITH MINERALS) TABS Take 2 tablets by mouth daily.     OLIVE LEAF EXTRACT PO Take 1 tablet by mouth daily.     olmesartan (BENICAR) 40 MG tablet TAKE 1 TABLET BY MOUTH DAILY. 90 tablet  3   OVER THE COUNTER MEDICATION Take 1 capsule by mouth daily. Berry extract     OVER THE COUNTER MEDICATION Take 1 capsule by mouth daily. Vitamin d 5000 units w/ iodine 1069mcg     Pomegranate, Punica granatum, (POMEGRANATE PO) Take 250 mg by mouth daily.     Saw Palmetto, Serenoa repens, (SAW PALMETTO PO) Take 1,000 mg by mouth daily.     traMADol (ULTRAM) 50 MG tablet Take 50 mg by mouth every 4 (four) hours as needed.     triamcinolone (NASACORT) 55 MCG/ACT nasal inhaler Place 1 spray into both nostrils daily as needed (allergies). (Patient not taking: Reported on 08/08/2020)     Vit C-Cholecalciferol-Rose Hip [phone] MG-UNIT-MG CAPS Take 350 mg by mouth daily.     VITAMIN E PO Take 360 mg by mouth daily.     warfarin (COUMADIN) 5 MG tablet TAKE 1 TO 2 TABLETS BY MOUTH DAILY OR AS DIRECTED BY THE COUMADIN CLINIC. 40 tablet 2   No current facility-administered medications for this visit.    REVIEW OF SYSTEMS:   .10 Point review of Systems was done is negative except as noted above.   PHYSICAL EXAMINATION: ECOG PERFORMANCE STATUS: 1 - Symptomatic but completely ambulatory  Exam was given in a chair. .BP 95/60 (BP Location: Left Arm, Patient Position: Sitting)   Pulse 69   Temp 98.6 F (37 C) (Oral)   Resp 19   Ht 5\' 11"  (1.803 m)   Wt 294 lb 12.8 oz (133.7 kg)   SpO2 96%   BMI 41.12 kg/m  . GENERAL:alert, in no acute distress and comfortable SKIN: no acute rashes, no significant lesions EYES: conjunctiva are pink and non-injected, sclera anicteric OROPHARYNX: MMM,  no exudates, no oropharyngeal erythema or ulceration NECK: supple, no JVD LYMPH:  no palpable lymphadenopathy in the cervical, axillary or inguinal regions LUNGS: clear to auscultation b/l with normal respiratory effort HEART: regular rate & rhythm ABDOMEN:  normoactive bowel sounds , non tender, not distended. Extremity: no pedal edema PSYCH: alert & oriented x 3 with fluent speech NEURO: no focal motor/sensory deficits    LABORATORY DATA:  I have reviewed the data as listed  CBC Latest Ref Rng & Units 09/07/2020 03/02/2020 10/20/2019  WBC 4.0 - 10.5 K/uL 4.6 5.2 5.7  Hemoglobin 13.0 - 17.0 g/dL 13.4 12.8(L) 15.3  Hematocrit 39.0 - 52.0 % 40.8 40.1 46.1  Platelets 150 - 400 K/uL 146(L) 196 171    CMP Latest Ref Rng & Units 09/07/2020 08/31/2020 08/18/2020  Glucose 70 - 99 mg/dL 123(H) 99 96  BUN 8 - 23 mg/dL 56(H) 53(H) 47(H)  Creatinine 0.61 - 1.24 mg/dL 2.69(H) 2.33(H) 2.33(H)  Sodium 135 - 145 mmol/L 142 139 143  Potassium 3.5 - 5.1 mmol/L 4.1 4.4 4.7  Chloride 98 - 111 mmol/L 107 101 105  CO2 22 - 32 mmol/L 25 24 21   Calcium 8.9 - 10.3 mg/dL 9.5 9.9 9.9  Total Protein 6.5 - 8.1 g/dL 6.2(L) - -  Total Bilirubin 0.3 - 1.2 mg/dL 0.6 - -  Alkaline Phos 38 - 126 U/L 68 - -  AST 15 - 41 U/L 17 - -  ALT 0 - 44 U/L 15 - -     RADIOGRAPHIC STUDIES: I have personally reviewed the radiological images as listed and agreed with the findings in the report. ECHOCARDIOGRAM COMPLETE  Result Date: 09/08/2020    ECHOCARDIOGRAM REPORT   Patient Name:   Harry Foster Date of Exam:  09/08/2020 Medical Rec #:  [id number]          Height:       71.0 in Accession #:    [id number]         Weight:       301.0 lb Date of Birth:  Jan 11, 1945          BSA:          2.509 m Patient Age:    63 years           BP:           112/80 mmHg Patient Gender: M                  HR:           57 bpm. Exam Location:  [place] Procedure: 2D Echo, Cardiac Doppler and Color Doppler Indications:    S28.31 Acute  Diastolic Heart Failure  History:        Patient has prior history of Echocardiogram examinations, most                 recent 09/13/2015. Arrythmias:Atrial Fibrillation; Risk                 Factors:Hypertension and Obesity.  Sonographer:    Cresenciano Lick RDCS Referring Phys: [place]  1. Left ventricular ejection fraction, by estimation, is 55 to 60%. The left ventricle has normal function. The left ventricle has no regional wall motion abnormalities. Left ventricular diastolic function could not be evaluated.  2. Right ventricular systolic function was not well visualized. The right ventricular size is moderately enlarged.  3. The mitral valve is normal in structure. No evidence of mitral valve regurgitation. No evidence of mitral stenosis.  4. Tricuspid valve regurgitation is severe.  5. The aortic valve is normal in structure. Aortic valve regurgitation is not visualized. No aortic stenosis is present.  6. Aortic dilatation noted. Aneurysm of the ascending aorta, measuring 47 mm. There is mild dilatation of the aortic root, measuring 41 mm. FINDINGS  Left Ventricle: Left ventricular ejection fraction, by estimation, is 55 to 60%. The left ventricle has normal function. The left ventricle has no regional wall motion abnormalities. Definity contrast agent was given IV to delineate the left ventricular  endocardial borders. The left ventricular internal cavity size was normal in size. There is no left ventricular hypertrophy. Left ventricular diastolic function could not be evaluated due to atrial fibrillation. Left ventricular diastolic function could  not be evaluated. Right Ventricle: The right ventricular size is moderately enlarged. No increase in right ventricular wall thickness. Right ventricular systolic function was not well visualized. Left Atrium: Left atrial size was normal in size. Right Atrium: Right atrial size was normal in size. Pericardium: There is no evidence of  pericardial effusion. Mitral Valve: The mitral valve is normal in structure. No evidence of mitral valve regurgitation. No evidence of mitral valve stenosis. Tricuspid Valve: The tricuspid valve is normal in structure. Tricuspid valve regurgitation is severe. No evidence of tricuspid stenosis. Aortic Valve: The aortic valve is normal in structure. Aortic valve regurgitation is not visualized. No aortic stenosis is present. Pulmonic Valve: The pulmonic valve was normal in structure. Pulmonic valve regurgitation is not visualized. No evidence of pulmonic stenosis. Aorta: Aortic dilatation noted. There is mild dilatation of the aortic root, measuring 41 mm. There is an aneurysm involving the ascending aorta measuring 47 mm. Venous: The inferior vena cava was not well visualized.  IAS/Shunts: No atrial level shunt detected by color flow Doppler.  LEFT VENTRICLE PLAX 2D LVIDd:         5.50 cm LVIDs:         4.20 cm LV PW:         0.80 cm LV IVS:        0.80 cm  RIGHT VENTRICLE RV Basal diam:  4.90 cm RV S prime:     13.93 cm/s TAPSE (M-mode): 2.3 cm LEFT ATRIUM             Index       RIGHT ATRIUM           Index LA diam:        5.20 cm 2.07 cm/m  RA Area:     19.80 cm LA Vol (A2C):   52.6 ml 20.97 ml/m RA Volume:   56.50 ml  22.52 ml/m LA Vol (A4C):   65.9 ml 26.27 ml/m LA Biplane Vol: 58.9 ml 23.48 ml/m   AORTA Ao Root diam: 4.10 cm Ao Asc diam:  4.70 cm MV E velocity: 119.00 cm/s  TRICUSPID VALVE                             TR Peak grad:   25.0 mmHg                             TR Vmax:        250.00 cm/s Fransico Him MD Electronically signed by Fransico Him MD Signature Date/Time: 09/08/2020/2:23:28 PM    Final     ASSESSMENT & PLAN:   75 yo with   1) Monoclonal paraproteinemia 2) CKD - likely from HTN, obesity less likely from plasma cell dyscrasia.  PLAN: -Discussed pt labwork, 03/03/2020; m-protein improved, Light Chain ratio WNL, blood counts decreased but still normal, chemistries stable given  chronic kidney disease. -Advised pt that abnormal antibody response was partially due to inflammatory response due to gout. This is reason behind pt's decrease from recent labs.  -Recommended pt f/u w PCP regarding bone density study. The pt wishes for [place] to order this. Advised pt he has multiple risk fractures for bone loss.  -Recommended pt get enough calcium and Vitamin D.  -Will see back in 12 months with labs one week prior.     FOLLOW UP: Return to clinic with Dr. Irene Limbo in 12 months. Please schedule labs 1 week prior to clinic visit    . The total time spent in the appointment was 20 minutes and more than 50% was on counseling and direct patient cares.   All of the patient's questions were answered with apparent satisfaction. The patient knows to call the clinic with any problems, questions or concerns.    Sullivan Lone MD [place] AAHIVMS [facility] Hematology/Oncology Physician [facility]  (Office):       [phone] (Work cell):  [phone] (Fax):           [phone]

## 2020-09-22 ENCOUNTER — Inpatient Hospital Stay (HOSPITAL_COMMUNITY): Admission: RE | Admit: 2020-09-22 | Payer: Medicare Other | Source: Ambulatory Visit

## 2020-09-23 ENCOUNTER — Ambulatory Visit (HOSPITAL_COMMUNITY)
Admission: RE | Admit: 2020-09-23 | Discharge: 2020-09-23 | Disposition: A | Discharge status: Home / Self Care | Payer: Medicare Other | Source: Ambulatory Visit | Attending: Internal Medicine | Admitting: Internal Medicine

## 2020-09-23 ENCOUNTER — Other Ambulatory Visit: Payer: Self-pay

## 2020-09-23 DIAGNOSIS — I83029 Varicose veins of left lower extremity with ulcer of unspecified site: Secondary | ICD-10-CM | POA: Diagnosis not present

## 2020-09-23 DIAGNOSIS — L97919 Non-pressure chronic ulcer of unspecified part of right lower leg with unspecified severity: Secondary | ICD-10-CM | POA: Insufficient documentation

## 2020-09-23 DIAGNOSIS — I83019 Varicose veins of right lower extremity with ulcer of unspecified site: Secondary | ICD-10-CM

## 2020-09-23 DIAGNOSIS — L97929 Non-pressure chronic ulcer of unspecified part of left lower leg with unspecified severity: Secondary | ICD-10-CM | POA: Insufficient documentation

## 2020-09-27 ENCOUNTER — Encounter (HOSPITAL_BASED_OUTPATIENT_CLINIC_OR_DEPARTMENT_OTHER): Payer: Medicare Other | Admitting: Internal Medicine

## 2020-09-29 ENCOUNTER — Other Ambulatory Visit: Payer: Self-pay

## 2020-09-29 ENCOUNTER — Ambulatory Visit (INDEPENDENT_AMBULATORY_CARE_PROVIDER_SITE_OTHER): Payer: Medicare Other | Admitting: *Deleted

## 2020-09-29 DIAGNOSIS — Z5181 Encounter for therapeutic drug level monitoring: Secondary | ICD-10-CM

## 2020-09-29 DIAGNOSIS — I4819 Other persistent atrial fibrillation: Secondary | ICD-10-CM

## 2020-09-29 LAB — POCT INR: INR: 2.7 (ref 2.0–3.0)

## 2020-09-29 NOTE — Patient Instructions (Signed)
Description   Continue taking Warfarin 1 tablet daily except for 2 tablets on Fridays. Recheck INR in 6 weeks. Call Coumadin Clinic for any questions/updates at [phone].

## 2020-10-03 ENCOUNTER — Encounter (HOSPITAL_BASED_OUTPATIENT_CLINIC_OR_DEPARTMENT_OTHER): Payer: Medicare Other | Admitting: Internal Medicine

## 2020-10-04 DIAGNOSIS — I480 Paroxysmal atrial fibrillation: Secondary | ICD-10-CM | POA: Diagnosis not present

## 2020-10-04 DIAGNOSIS — R600 Localized edema: Secondary | ICD-10-CM | POA: Diagnosis not present

## 2020-10-04 DIAGNOSIS — D472 Monoclonal gammopathy: Secondary | ICD-10-CM | POA: Diagnosis not present

## 2020-10-04 DIAGNOSIS — I129 Hypertensive chronic kidney disease with stage 1 through stage 4 chronic kidney disease, or unspecified chronic kidney disease: Secondary | ICD-10-CM | POA: Diagnosis not present

## 2020-10-04 DIAGNOSIS — N1832 Chronic kidney disease, stage 3b: Secondary | ICD-10-CM | POA: Diagnosis not present

## 2020-10-04 DIAGNOSIS — M1000 Idiopathic gout, unspecified site: Secondary | ICD-10-CM | POA: Diagnosis not present

## 2020-10-04 DIAGNOSIS — I5032 Chronic diastolic (congestive) heart failure: Secondary | ICD-10-CM | POA: Diagnosis not present

## 2020-10-06 ENCOUNTER — Encounter (HOSPITAL_BASED_OUTPATIENT_CLINIC_OR_DEPARTMENT_OTHER): Payer: Medicare Other | Attending: Internal Medicine | Admitting: Internal Medicine

## 2020-10-06 ENCOUNTER — Other Ambulatory Visit: Payer: Self-pay

## 2020-10-06 DIAGNOSIS — I4819 Other persistent atrial fibrillation: Secondary | ICD-10-CM | POA: Insufficient documentation

## 2020-10-06 DIAGNOSIS — L97829 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ity: Secondary | ICD-10-CM | POA: Diagnosis not present

## 2020-10-06 DIAGNOSIS — Z872 Personal history of diseases of the skin and subcutaneous tissue: Secondary | ICD-10-CM | POA: Insufficient documentation

## 2020-10-06 DIAGNOSIS — I5042 Chronic combined systolic (congestive) and diastolic (congestive) heart failure: Secondary | ICD-10-CM | POA: Diagnosis not present

## 2020-10-06 DIAGNOSIS — I130 Hypertensive heart and chronic kidney disease with heart failure and stage 1 through stage 4 chronic kidney disease, or unspecified chronic kidney disease: Secondary | ICD-10-CM | POA: Insufficient documentation

## 2020-10-06 DIAGNOSIS — Z09 Encounter for follow-up examination after completed treatment for conditions other than malignant neoplasm: Secondary | ICD-10-CM | POA: Diagnosis not present

## 2020-10-06 DIAGNOSIS — Z7901 Long term (current) use of anticoagulants: Secondary | ICD-10-CM | POA: Insufficient documentation

## 2020-10-06 DIAGNOSIS — M1A9XX1 Chronic gout, unspecified, with tophus (tophi): Secondary | ICD-10-CM | POA: Insufficient documentation

## 2020-10-06 DIAGNOSIS — Z96653 Presence of artificial knee joint, bilateral: Secondary | ICD-10-CM | POA: Diagnosis not present

## 2020-10-06 DIAGNOSIS — I504 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 failure: Secondary | ICD-10-CM

## 2020-10-06 DIAGNOSIS — N184 Chronic kidney disease, stage 4 (severe): Secondary | ICD-10-CM | POA: Diagnosis not present

## 2020-10-06 DIAGNOSIS — I872 Venous insufficiency (chronic) (peripheral): Secondary | ICD-10-CM | POA: Insufficient documentation

## 2020-10-11 NOTE — Progress Notes (Signed)
CAPRICE, WASKO ([id number]) Visit Report for 10/06/2020 Arrival Information Details Patient Name: Date of Service: Harry Foster 10/06/2020 3:00 PM Medical Record Number: [id number] Patient Account Number: [id number] Date of Birth/Sex: Treating RN: [birth year] (75 y.o. [name], [name] Primary Care Valente Fosberg: [name] Other Clinician: Referring Starsha Morning: Treating Micaiah Litle/Extender: [name] in Treatment: 20 Visit Information History Since Last Visit Added or deleted any medications: No Patient Arrived: Ambulatory Any new allergies or adverse reactions: No Arrival Time: 15:52 Had a fall or experienced change in No Accompanied By: self activities of daily living that may affect Transfer Assistance: None risk of falls: Patient Identification Verified: Yes Signs or symptoms of abuse/neglect since last visito No Secondary Verification Process Completed: Yes Hospitalized since last visit: No Patient Requires Transmission-Based Precautions: No Implantable device outside of the clinic excluding No Patient Has Alerts: Yes cellular tissue based products placed in the center Patient Alerts: R ABI: 1.63 TBI: 1.36 since last visit: L ABI: 1.49 TBI: 1.34 Has Dressing in Place as Prescribed: Yes 05/2020 Pain Present Now: No Electronic Signature(s) Signed: 10/11/2020 5:02:53 PM By: Rhae Hammock RN Entered By: Rhae Hammock on 10/06/2020 15:52:50 -------------------------------------------------------------------------------- Clinic Level of Care Assessment Details Patient Name: Date of Service: Harry Foster 10/06/2020 3:00 PM Medical Record Number: [id number] Patient Account Number: [id number] Date of Birth/Sex: Treating RN: 09/23/1945 (75 y.o. [name], [name] Primary Care Jahi Roza: [name] Other Clinician: Referring Rodnesha Elie: Treating Tykwon Fera/Extender: [name] in  Treatment: 20 Clinic Level of Care Assessment Items TOOL 4 Quantity Score X- 1 0 Use when only an EandM is performed on FOLLOW-UP visit ASSESSMENTS - Nursing Assessment / Reassessment X- 1 10 Reassessment of Co-morbidities (includes updates in patient status) X- 1 5 Reassessment of Adherence to Treatment Plan ASSESSMENTS - Wound and Skin A ssessment / Reassessment X - Simple Wound Assessment / Reassessment - one wound 1 5 []  - 0 Complex Wound Assessment / Reassessment - multiple wounds X- 1 10 Dermatologic / Skin Assessment (not related to wound area) ASSESSMENTS - Focused Assessment X- 1 5 Circumferential Edema Measurements - multi extremities []  - 0 Nutritional Assessment / Counseling / Intervention []  - 0 Lower Extremity Assessment (monofilament, tuning fork, pulses) []  - 0 Peripheral Arterial Disease Assessment (using hand held doppler) ASSESSMENTS - Ostomy and/or Continence Assessment and Care []  - 0 Incontinence Assessment and Management []  - 0 Ostomy Care Assessment and Management (repouching, etc.) PROCESS - Coordination of Care X - Simple Patient / Family Education for ongoing care 1 15 []  - 0 Complex (extensive) Patient / Family Education for ongoing care X- 1 10 Staff obtains Programmer, systems, Records, T Results / Process Orders est []  - 0 Staff telephones HHA, Nursing Homes / Clarify orders / etc []  - 0 Routine Transfer to another Facility (non-emergent condition) []  - 0 Routine Hospital Admission (non-emergent condition) []  - 0 New Admissions / Biomedical engineer / Ordering NPWT Apligraf, etc. , []  - 0 Emergency Hospital Admission (emergent condition) X- 1 10 Simple Discharge Coordination []  - 0 Complex (extensive) Discharge Coordination PROCESS - Special Needs []  - 0 Pediatric / Minor Patient Management []  - 0 Isolation Patient Management []  - 0 Hearing / Language / Visual special needs []  - 0 Assessment of Community assistance (transportation,  D/C planning, etc.) []  - 0 Additional assistance / Altered mentation []  - 0 Support Surface(s) Assessment (bed, cushion, seat, etc.) INTERVENTIONS - Wound Cleansing / Measurement X -  Simple Wound Cleansing - one wound 1 5 []  - 0 Complex Wound Cleansing - multiple wounds X- 1 5 Wound Imaging (photographs - any number of wounds) []  - 0 Wound Tracing (instead of photographs) X- 1 5 Simple Wound Measurement - one wound []  - 0 Complex Wound Measurement - multiple wounds INTERVENTIONS - Wound Dressings X - Small Wound Dressing one or multiple wounds 1 10 []  - 0 Medium Wound Dressing one or multiple wounds []  - 0 Large Wound Dressing one or multiple wounds []  - 0 Application of Medications - topical []  - 0 Application of Medications - injection INTERVENTIONS - Miscellaneous []  - 0 External ear exam []  - 0 Specimen Collection (cultures, biopsies, blood, body fluids, etc.) []  - 0 Specimen(s) / Culture(s) sent or taken to Lab for analysis []  - 0 Patient Transfer (multiple staff / Civil Service fast streamer / Similar devices) []  - 0 Simple Staple / Suture removal (25 or less) []  - 0 Complex Staple / Suture removal (26 or more) []  - 0 Hypo / Hyperglycemic Management (close monitor of Blood Glucose) []  - 0 Ankle / Brachial Index (ABI) - do not check if billed separately X- 1 5 Vital Signs Has the patient been seen at the hospital within the last three years: Yes Total Score: 100 Level Of Care: New/Established - Level 3 Electronic Signature(s) Signed: 10/11/2020 5:02:53 PM By: Rhae Hammock RN Entered By: Rhae Hammock on 10/06/2020 16:13:31 -------------------------------------------------------------------------------- Encounter Discharge Information Details Patient Name: Date of Service: Harry Foster. 10/06/2020 3:00 PM Medical Record Number: [id number] Patient Account Number: [id number] Date of Birth/Sex: Treating RN: 1945-01-20 (75 y.o. [name], [name] Primary Care  Laylynn Campanella: [name] Other Clinician: Referring Hadden Steig: Treating Jennika Ringgold/Extender: [name] in Treatment: 20 Encounter Discharge Information Items Discharge Condition: Stable Ambulatory Status: Ambulatory Discharge Destination: Home Transportation: Private Auto Accompanied By: self Schedule Follow-up Appointment: Yes Clinical Summary of Care: Patient Declined Electronic Signature(s) Signed: 10/11/2020 5:02:53 PM By: Rhae Hammock RN Entered By: Rhae Hammock on 10/06/2020 16:14:10 -------------------------------------------------------------------------------- Lower Extremity Assessment Details Patient Name: Date of Service: Harry Foster. 10/06/2020 3:00 PM Medical Record Number: [id number] Patient Account Number: [id number] Date of Birth/Sex: Treating RN: [birth year] (75 y.o. [name], [name] Primary Care Zalea Pete: [name] Other Clinician: Referring Kamar Callender: Treating Leilanie Rauda/Extender: [name] in Treatment: 20 Edema Assessment Assessed: Shirlyn Goltz: Yes] Patrice Paradise: No] Edema: [Left: Ye] [Right: s] Calf Left: Right: Point of Measurement: 31 cm From Medial Instep 42 cm Ankle Left: Right: Point of Measurement: 10 cm From Medial Instep 25.5 cm Vascular Assessment Pulses: Dorsalis Pedis Palpable: [Left:Yes] Posterior Tibial Palpable: [Left:Yes] Electronic Signature(s) Signed: 10/11/2020 5:02:53 PM By: Rhae Hammock RN Entered By: Rhae Hammock on 10/06/2020 15:53:03 -------------------------------------------------------------------------------- Multi Wound Chart Details Patient Name: Date of Service: Harry Foster. 10/06/2020 3:00 PM Medical Record Number: [id number] Patient Account Number: [id number] Date of Birth/Sex: Treating RN: March 09, 1945 (75 y.o. [name], [name] Primary Care Kynnedy Carreno: [name] Other Clinician: Referring  Rosali Augello: Treating Cathline Dowen/Extender: [name] in Treatment: 20 Vital Signs Height(in): 71 Pulse(bpm): 74 Weight(lbs): 295 Blood Pressure(mmHg): 100/60 Body Mass Index(BMI): 41 Temperature(F): 98.7 Respiratory Rate(breaths/min): 17 Photos: [8:No Photos Left, Anterior Lower Leg] [N/A:N/A N/A] Wound Location: [8:Blister] [N/A:N/A] Wounding Event: [8:Venous Leg Ulcer] [N/A:N/A] Primary Etiology: [8:Cataracts, Arrhythmia, Congestive N/A] Comorbid History: [8:Heart Failure, Hypertension, Peripheral Venous Disease, Gout, Osteoarthritis 09/06/2020] [N/A:N/A] Date Acquired: [8:3] [N/A:N/A] Weeks of Treatment: [8:Healed -  Epithelialized] [N/A:N/A] Wound Status: [8:0x0x0] [N/A:N/A] Measurements L x W x D (cm) [8:0] [N/A:N/A] A (cm) : rea [8:0] [N/A:N/A] Volume (cm) : [8:100.00%] [N/A:N/A] % Reduction in Area: [8:100.00%] [N/A:N/A] % Reduction in Volume: [8:Full Thickness Without Exposed] [N/A:N/A] Classification: [8:Support Structures Medium] [N/A:N/A] Exudate Amount: [8:Serous] [N/A:N/A] Exudate Type: [8:amber] [N/A:N/A] Exudate Color: [8:Distinct, outline attached] [N/A:N/A] Wound Margin: [8:Large (67-100%)] [N/A:N/A] Granulation Amount: [8:Red, Pink] [N/A:N/A] Granulation Quality: [8:None Present (0%)] [N/A:N/A] Necrotic Amount: [8:Fat Layer (Subcutaneous Tissue): Yes N/A] Exposed Structures: [8:Fascia: No Tendon: No Muscle: No Joint: No Bone: No None] [N/A:N/A] Treatment Notes Wound #8 (Lower Leg) Wound Laterality: Left, Anterior Cleanser Peri-Wound Care Topical Primary Dressing Secondary Dressing Secured With Compression Wrap Compression Stockings Add-Ons Electronic Signature(s) Signed: 10/06/2020 4:24:30 PM By: Kalman Shan DO Signed: 10/11/2020 5:02:53 PM By: Rhae Hammock RN Entered By: Kalman Shan on 10/06/2020  16:16:19 -------------------------------------------------------------------------------- Multi-Disciplinary Care Plan Details Patient Name: Date of Service: [name] 10/06/2020 3:00 PM Medical Record Number: [id number] Patient Account Number: [id number] Date of Birth/Sex: Treating RN: 1945-10-13 (75 y.o. [name], [name] Primary Care Constant Mand[place]: [name] Other Clinician: Referring Jailene Cupit: Treating Willson Lipa/Extender: [name] in Treatment: [place] reviewed with physician Active Inactive Electronic Signature(s) Signed: 10/11/2020 5:02:53 PM By: Rhae Hammock RN Entered By: Rhae Hammock on 10/06/2020 16:12:26 -------------------------------------------------------------------------------- Pain Assessment Details Patient Name: Date of Service: Harry Foster. 10/06/2020 3:00 PM Medical Record Number: [id number] Patient Account Number: [id number] Date of Birth/Sex: Treating RN: May 10, 1945 (75 y.o. [name], [name] Primary Care Ayda Tancredi: [name] Other Clinician: Referring Jasneet Schobert: Treating Lillyauna Jenkinson/Extender: [name] in Treatment: 20 Active Problems Location of Pain Severity and Description of Pain Patient Has Paino No Site Locations Pain Management and Medication Current Pain Management: Electronic Signature(s) Signed: 10/11/2020 5:02:53 PM By: Rhae Hammock RN Entered By: Rhae Hammock on 10/06/2020 15:53:32 -------------------------------------------------------------------------------- Patient/Caregiver Education Details Patient Name: Date of Service: Harry Foster 10/6/2022andnbsp3:00 PM Medical Record Number: [id number] Patient Account Number: [id number] Date of Birth/Gender: Treating RN: 1945-06-03 (75 y.o. Erie Noe Primary Care Physician: [name] Other Clinician: Referring  Physician: Treating Physician/Extender: [name] in Treatment: 20 Education Assessment Education Provided To: Patient Education Topics Provided Venous: Methods: Explain/Verbal Responses: State content correctly Wound/Skin Impairment: Methods: Explain/Verbal Responses: State content correctly Electronic Signature(s) Signed: 10/11/2020 5:02:53 PM By: Rhae Hammock RN Entered By: Rhae Hammock on 10/06/2020 15:56:31 -------------------------------------------------------------------------------- Wound Assessment Details Patient Name: Date of Service: Harry Foster. 10/06/2020 3:00 PM Medical Record Number: [id number] Patient Account Number: [id number] Date of Birth/Sex: Treating RN: [birth year] (75 y.o. [name], [name] Primary Care Caylynn Minchew: [name] Other Clinician: Referring Leina Babe: Treating Zebulun Deman/Extender: [name] in Treatment: 20 Wound Status Wound Number: 8 Primary Venous Leg Ulcer Etiology: Wound Location: Left, Anterior Lower Leg Wound Healed - Epithelialized Wounding Event: Blister Status: Date Acquired: 09/06/2020 Comorbid Cataracts, Arrhythmia, Congestive Heart Failure, Hypertension, Weeks Of Treatment: 3 History: Peripheral Venous Disease, Gout, Osteoarthritis Clustered Wound: No Wound Measurements Length: (cm) Width: (cm) Depth: (cm) Area: (cm) Volume: (cm) 0 % Reduction in Area: 100% 0 % Reduction in Volume: 100% 0 Epithelialization: None 0 Tunneling: No 0 Undermining: No Wound Description Classification: Full Thickness Without Exposed Support Structures Wound Margin: Distinct, outline attached Exudate Amount: Medium Exudate Type: Serous Exudate Color: amber Foul Odor After Cleansing: No Slough/Fibrino No Wound Bed Granulation Amount: Large (67-100%)  Exposed Structure Granulation Quality: Red, Pink Fascia Exposed: No Necrotic Amount: None  Present (0%) Fat Layer (Subcutaneous Tissue) Exposed: Yes Tendon Exposed: No Muscle Exposed: No Joint Exposed: No Bone Exposed: No Treatment Notes Wound #8 (Lower Leg) Wound Laterality: Left, Anterior Cleanser Peri-Wound Care Topical Primary Dressing Secondary Dressing Secured With Compression Wrap Compression Stockings Add-Ons Electronic Signature(s) Signed: 10/11/2020 5:02:53 PM By: Rhae Hammock RN Entered By: Rhae Hammock on 10/06/2020 16:11:34 -------------------------------------------------------------------------------- [place] Details Patient Name: Date of Service: [name] 10/06/2020 3:00 PM Medical Record Number: [id number] Patient Account Number: [id number] Date of Birth/Sex: Treating RN: [birth year] (75 y.o. [name], [name] Primary Care Baxter Gonzalez: [name] Other Clinician: Referring Costas Sena: Treating Najiyah Paris/Extender: [name] in Treatment: 20 Vital Signs Time Taken: 15:54 Temperature (F): 98.7 Height (in): 71 Pulse (bpm): 74 Weight (lbs): 295 Respiratory Rate (breaths/min): 17 Body Mass Index (BMI): 41.1 Blood Pressure (mmHg): 100/60 Reference Range: 80 - 120 mg / dl Electronic Signature(s) Signed: 10/11/2020 5:02:53 PM By: Rhae Hammock RN Entered By: Rhae Hammock on 10/06/2020 15:54:05

## 2020-10-11 NOTE — Progress Notes (Signed)
KADARIUS, CUFFE ([id number]) Visit Report for 10/06/2020 Chief Complaint Document Details Patient Name: Date of Service: Harry Foster 10/06/2020 3:00 PM Medical Record Number: [id number] Patient Account Number: [id number] Date of Birth/Sex: Treating RN: 31-Jul-1945 (75 y.o. [name], [name] Primary Care Provider: [name] Other Clinician: Referring Provider: Treating Provider/Extender: [name] in Treatment: 20 Information Obtained from: Patient Chief Complaint Left lower extremity skin breakdown Electronic Signature(s) Signed: 10/06/2020 4:24:30 PM By: Kalman Shan DO Entered By: Kalman Shan on 10/06/2020 16:16:27 -------------------------------------------------------------------------------- HPI Details Patient Name: Date of Service: Harry Leventhal G. 10/06/2020 3:00 PM Medical Record Number: [id number] Patient Account Number: [id number] Date of Birth/Sex: Treating RN: 05-Sep-1945 (75 y.o. [name], [name] Primary Care Provider: [name] Other Clinician: Referring Provider: Treating Provider/Extender: [name] in Treatment: 20 History of Present Illness HPI Description: Mr. Freeland Pracht III is a 75 year old male with a past medical history of persistent atrial fibrillation on Coumadin, hypertension, gout, CHF And venous insufficiency that presents to the clinic for bilateral lower extremity wounds. He states he has a history of blistering to his legs that eventually pop and heal. He states that in the past 6 weeks he has been unable to heal his wounds. He keeps them wrapped however they soak through quickly and has to change the wraps frequently. 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 He sleeps in a recliner at night and keeps his legs down due to increased pain when they are elevated. He states this symptom started a few weeks ago.  He denies signs of infection. 5/23; patient presents for 1 week follow-up. He had Kerlix/Coban wrap placed at last clinic visit and changed again last week due to increased drainage. He states that there has been an improvement in swelling and drainage over the past week. He is going to have arterial studies done tomorrow. He denies signs of infection. 5/31; patient presents for 1 week follow-up. He has been doing well with Kerlix/Coban wrap. He denies signs of infection. 6/7; patient presents for 1 week follow-up. He has been using Kerlix/Coban wraps bilaterally with collagen underneath. He has tolerated these well. He denies signs of infection. The right leg no longer has wounds. He has his [name] wraps today 6/14; the patient is effectively healed on the left leg. He already is wearing his [name] wraps on the right leg he has 20/30 stockings below this. He has a stocking for the left leg. He says he is going to go to elastic therapy for 20/30 stockings to put under the Farrow wraps 6/30; patient was discharged from the clinic 2 weeks ago for closed wounds. He reports using Farrow wraps bilaterally. He says he noticed in the past week he was developing new wounds. He denies signs of infection. 7/12; patient presents for follow-up. He has been tolerating the Kerlix/Coban well. When the wrap was taken off today he noticed a blister and this opened in the clinic. He denies signs of infection. 7/21. The patient arrives healed. Previously followed by Dr. Heber [place]. He has severe chronic venous insufficiency. The blister he noted last week on the left anterior leg is closed. He has Farrow wrap stockings he is familiar with these. I cannot really determine from him today whether he was actually using these when these wounds formed. Readmission 8/1 Patient presents again for skin breakdown to his left lower extremity. He was discharged less  than 2 weeks ago for a wound to his left lower extremity. He states  that on discharge the following day he had a blister that opened and popped. Its been healing since. He has been tightly wrapping his leg from the ankle up and placing a Farrow wrap over this. He currently denies signs of infection. 8/15; patient presents for 2-week follow-up. He states that his diuretic was increased recently by his cardiologist. He reports improvement to his lower extremity swelling. He states that the wounds are healed. Readmission 9/12 Patient presents again for left lower extremity wound limited to skin breakdown. He has been using Mepilex light dressings with antibiotic ointment. He continues to use his Farrow wrap. He is on 120 mg of Lasix daily. He denies signs of infection. 9/19; patient presents for follow-up. He has been using Mepilex light dressings with antibiotic ointment under Farrow wraps. He reports improvement in wound healing. He is scheduled for venous reflux studies this week. He has no issues or complaints today. He denies signs of infection. 10/6; patient presents for follow-up. He has no issues or complaints today. He reports that the wounds are closed. He would like to try 20-30 compression stockings and give the Farrow wrap a break. Electronic Signature(s) Signed: 10/06/2020 4:24:30 PM By: Kalman Shan DO Entered By: Kalman Shan on 10/06/2020 16:17:42 -------------------------------------------------------------------------------- Physical Exam Details Patient Name: Date of Service: Harry Leventhal G. 10/06/2020 3:00 PM Medical Record Number: [id number] Patient Account Number: [id number] Date of Birth/Sex: Treating RN: [birth year] (75 y.o. Erie Noe Primary Care Provider: [name] Other Clinician: Referring Provider: Treating Provider/Extender: [name] in Treatment: 20 Constitutional respirations regular, non-labored and within target range for patient.. Cardiovascular 2+ dorsalis  pedis/posterior tibialis pulses. Psychiatric pleasant and cooperative. Notes Left lower extremity: Epithelialization to previous wound site. 2+ pitting edema to the knee. No obvious signs of infection. Electronic Signature(s) Signed: 10/06/2020 4:24:30 PM By: Kalman Shan DO Entered By: Kalman Shan on 10/06/2020 16:18:15 -------------------------------------------------------------------------------- Physician Orders Details Patient Name: Date of Service: Harry Leventhal G. 10/06/2020 3:00 PM Medical Record Number: [id number] Patient Account Number: [id number] Date of Birth/Sex: Treating RN: 14-May-1945 (75 y.o. 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 - Dr. Heber [place] Edema Control - Lymphedema / SCD / Other Elevate legs to the level of the heart or above for 30 minutes daily and/or when sitting, a frequency of: Avoid standing for long periods of time. Moisturize legs daily. Compression stocking or Garment 20-30 mm/Hg pressure to: - Patient has Juxtalite Compression garment Additional Orders / Instructions Follow Nutritious Diet Electronic Signature(s) Signed: 10/06/2020 4:24:30 PM By: Kalman Shan DO Entered By: Kalman Shan on 10/06/2020  16:20:35 -------------------------------------------------------------------------------- Problem List Details Patient Name: Date of Service: Harry Leventhal G. 10/06/2020 3:00 PM Medical Record Number: [id number] Patient Account Number: [id number] Date of Birth/Sex: Treating RN: [birth year] (75 y.o. Erie Noe Primary Care Provider: [name] Other Clinician: Referring Provider:  Treating Provider/Extender: [name] in Treatment: 20 Active Problems ICD-10 Encounter Code Description Active Date MDM Diagnosis L97.829 Non-pressure chronic ulcer of other part of left lower leg with unspecified 05/16/2020 No Yes severity I87.2 Venous insufficiency (chronic) (peripheral) 05/16/2020 No Yes I48.19 Other persistent atrial fibrillation 05/16/2020 No Yes Z79.01 Long term (current) use of anticoagulants 05/16/2020 No Yes I50.40 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 05/16/2020 No Yes failure M1A.9XX1 Chronic gout, unspecified, with tophus (tophi) 05/16/2020 No Yes N18.4 Chronic kidney disease, stage 4 (severe) 05/16/2020 No Yes Inactive Problems Resolved Problems ICD-10 Code Description Active Date Resolved Date L97.919 Non-pressure chronic ulcer of unspecified part of right lower leg with unspecified 05/16/2020 05/16/2020 severity Electronic Signature(s) Signed: 10/06/2020 4:24:30 PM By: Kalman Shan DO Entered By: Kalman Shan on 10/06/2020 16:16:13 -------------------------------------------------------------------------------- Progress Note Details Patient Name: Date of Service: Harry Leventhal G. 10/06/2020 3:00 PM Medical Record Number: [id number] Patient Account Number: [id number] Date of Birth/Sex: Treating RN: [birth year] (75 y.o. [name], [name] Primary Care Provider: [name] Other Clinician: Referring Provider: Treating Provider/Extender: [name] in Treatment: 20 Subjective Chief Complaint Information obtained from Patient Left lower extremity skin breakdown History of Present Illness (HPI) Mr. Mosi Hannold III is a 75 year old male with a past medical history of persistent atrial fibrillation on Coumadin, hypertension, gout, CHF And venous insufficiency that presents to the clinic for bilateral lower extremity wounds. He states he has a history of blistering to his legs that eventually pop and heal. He states that in the past 6 weeks he has been unable to heal his wounds. He keeps them wrapped however they soak through quickly and has to change the wraps frequently. 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 He sleeps in a recliner at night and keeps his legs down due to increased pain when they are elevated. He states this symptom started a few weeks ago. He denies signs of infection. 5/23; patient presents for 1 week follow-up. He had Kerlix/Coban wrap placed at last clinic visit and changed again last week due to increased drainage. He states that there has been an improvement in swelling and drainage over the past week. He is going to have arterial studies done tomorrow. He denies signs of infection. 5/31; patient presents for 1 week follow-up. He has been doing well with Kerlix/Coban wrap. He denies signs of infection. 6/7; patient presents for 1 week follow-up. He has been using Kerlix/Coban wraps bilaterally with collagen underneath. He has tolerated these well. He denies signs of infection. The right leg no longer has wounds. He has his [name] wraps today 6/14; the patient is effectively healed on the left leg. He already is wearing his [name] wraps on the right leg he has 20/30 stockings below this. He has a stocking for the left leg. He says he is going to go to elastic therapy for 20/30 stockings to put under the Farrow wraps 6/30; patient was discharged from the clinic 2 weeks  ago for closed wounds. He reports using Farrow wraps bilaterally. He says he noticed in the past week he was developing new wounds. He denies signs of infection. 7/12; patient presents for follow-up. He has been tolerating the Kerlix/Coban well. When the wrap was taken off today he noticed a blister and this opened in the clinic. He denies signs of infection. 7/21. The patient arrives healed. Previously followed by Dr. Heber [place]. He has  severe chronic venous insufficiency. The blister he noted last week on the left anterior leg is closed. He has Farrow wrap stockings he is familiar with these. I cannot really determine from him today whether he was actually using these when these wounds formed. Readmission 8/1 Patient presents again for skin breakdown to his left lower extremity. He was discharged less than 2 weeks ago for a wound to his left lower extremity. He states that on discharge the following day he had a blister that opened and popped. Its been healing since. He has been tightly wrapping his leg from the ankle up and placing a Farrow wrap over this. He currently denies signs of infection. 8/15; patient presents for 2-week follow-up. He states that his diuretic was increased recently by his cardiologist. He reports improvement to his lower extremity swelling. He states that the wounds are healed. Readmission 9/12 Patient presents again for left lower extremity wound limited to skin breakdown. He has been using Mepilex light dressings with antibiotic ointment. He continues to use his Farrow wrap. He is on 120 mg of Lasix daily. He denies signs of infection. 9/19; patient presents for follow-up. He has been using Mepilex light dressings with antibiotic ointment under Farrow wraps. He reports improvement in wound healing. He is scheduled for venous reflux studies this week. He has no issues or complaints today. He denies signs of infection. 10/6; patient presents for follow-up. He has no issues  or complaints today. He reports that the wounds are closed. He would like to try 20-30 compression stockings and give the Farrow wrap a break. Patient History Information obtained from Patient. Family History Cancer - Mother,Siblings, Heart Disease - Father, No family history of Diabetes, Hereditary Spherocytosis, Hypertension, Kidney Disease, Lung Disease, Seizures, Stroke, Thyroid Problems, Tuberculosis. Social History Never smoker, Marital Status - Widowed, Alcohol Use - Never, Drug Use - No History, Caffeine Use - Daily - coffee. Medical History Eyes Patient has history of Cataracts - mild Denies history of Glaucoma, Optic Neuritis Ear/Nose/Mouth/Throat Denies history of Chronic sinus problems/congestion, Middle ear problems Cardiovascular Patient has history of Arrhythmia - afib, Congestive Heart Failure, Hypertension, Peripheral Venous Disease Endocrine Denies history of Type I Diabetes, Type II Diabetes Genitourinary Denies history of End Stage Renal Disease Integumentary (Skin) Denies history of History of Burn Musculoskeletal Patient has history of Gout, Osteoarthritis Denies history of Rheumatoid Arthritis, Osteomyelitis Oncologic Denies history of Received Chemotherapy, Received Radiation Psychiatric Denies history of Anorexia/bulimia, Confinement Anxiety Hospitalization/Surgery History - bil knee replacements. Medical A Surgical History Notes nd Constitutional Symptoms (General Health) morbid obesity Genitourinary CKD stage 3 Objective Constitutional respirations regular, non-labored and within target range for patient.. Vitals Time Taken: 3:54 PM, Height: 71 in, Weight: 295 lbs, BMI: 41.1, Temperature: 98.7 F, Pulse: 74 bpm, Respiratory Rate: 17 breaths/min, Blood Pressure: 100/60 mmHg. Cardiovascular 2+ dorsalis pedis/posterior tibialis pulses. Psychiatric pleasant and cooperative. General Notes: Left lower extremity: Epithelialization to previous wound  site. 2+ pitting edema to the knee. No obvious signs of infection. Integumentary (Hair, Skin) Wound #8 status is Healed - Epithelialized. Original cause of wound was Blister. The date acquired was: 09/06/2020. The wound has been in treatment 3 weeks. The wound is located on the Left,Anterior Lower Leg. The wound measures 0cm length x 0cm width x 0cm depth; 0cm^2 area and 0cm^3 volume. There is Fat Layer (Subcutaneous Tissue) exposed. There is no tunneling or undermining noted. There is a medium amount of serous drainage noted. The wound margin is distinct with the outline  attached to the wound base. There is large (67-100%) red, pink granulation within the wound bed. There is no necrotic tissue within the wound bed. 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 He is a reoccurring patient and I would like to have him follow-up in 1 month to assure there are no issues. He would like to go down to just 20/30 compression stockings. He would like to give the [facility] wrap a break. He states this irritates his skin. I stated that if he starts developing blisters he may so need to switch to the [facility] wrap. For now this should be fine. He had vascular reflux studies done that showed venous reflux throughout the right and left extremity veins. He has been referred to vein and vascular for this issue. He has not heard back from them to schedule an appointment. We gave him the phone number today so he can call if he does not hear back in the next week. Plan Follow-up Appointments: Return appointment in 1 month. -  Dr. Heber [place] Edema Control - Lymphedema / SCD / Other: Elevate legs to the level of the heart or above for 30 minutes daily and/or when sitting, a frequency of: Avoid standing for long periods of time. Moisturize legs daily. Compression stocking or Garment 20-30 mm/Hg pressure to: - Patient has Juxtalite Compression garment Additional Orders / Instructions: Follow Nutritious Diet 1. Compression stockings daily 2. Follow-up in 1 month 3. Patient knows to call with any questions or concerns and can be seen sooner Electronic Signature(s) Signed: 10/06/2020 4:24:30 PM By: Kalman Shan DO Entered By: Kalman Shan on 10/06/2020 16:23:45 -------------------------------------------------------------------------------- HxROS Details Patient Name: Date of Service: Harry Leventhal G. 10/06/2020 3:00 PM Medical Record Number: [id number] Patient Account Number: [id number] Date of Birth/Sex: Treating RN: 1945/05/26 (75 y.o. [name], [name] Primary Care Provider: [name] Other Clinician: Referring Provider: Treating Provider/Extender: [name] in Treatment: 20 Information Obtained From Patient Constitutional Symptoms (General Health) Medical History: Past Medical History Notes: morbid obesity Eyes Medical History: Positive for: Cataracts - mild Negative for: Glaucoma; Optic Neuritis Ear/Nose/Mouth/Throat Medical History: Negative for: Chronic sinus problems/congestion; Middle ear problems Cardiovascular Medical History: Positive for: Arrhythmia - afib; Congestive Heart Failure; Hypertension; Peripheral Venous Disease Endocrine Medical History: Negative for: Type I Diabetes; Type II Diabetes Genitourinary Medical History: Negative for: End Stage Renal Disease Past Medical History Notes: CKD stage 3 Integumentary (Skin) Medical History: Negative for: History of Burn Musculoskeletal Medical History: Positive for: Gout;  Osteoarthritis Negative for: Rheumatoid Arthritis; Osteomyelitis Oncologic Medical History: Negative for: Received Chemotherapy; Received Radiation Psychiatric Medical History: Negative for: Anorexia/bulimia; Confinement Anxiety HBO Extended History Items Eyes: Cataracts Immunizations Pneumococcal Vaccine: Received Pneumococcal Vaccination: Yes Received Pneumococcal Vaccination On or After 60th Birthday: No Implantable Devices No devices added Hospitalization / Surgery History Type of Hospitalization/Surgery bil knee replacements Family and Social History Cancer: Yes - Mother,Siblings; Diabetes: No; Heart Disease: Yes - Father; Hereditary Spherocytosis: No; Hypertension: No; Kidney Disease: No; Lung Disease: No; Seizures: No; Stroke: No; Thyroid Problems: No; Tuberculosis: No; Never smoker; Marital Status - Widowed; Alcohol Use: Never; Drug Use: No History; Caffeine Use: Daily - coffee; Financial Concerns: No; Food, Clothing or Shelter  Needs: No; Support System Lacking: No; Transportation Concerns: No Electronic Signature(s) Signed: 10/06/2020 4:24:30 PM By: Kalman Shan DO Signed: 10/11/2020 5:02:53 PM By: Rhae Hammock RN Entered By: Kalman Shan on 10/06/2020 16:17:48 -------------------------------------------------------------------------------- SuperBill Details Patient Name: Date of Service: Harry Foster 10/06/2020 Medical Record Number: [id number] Patient Account Number: [id number] Date of Birth/Sex: Treating RN: 14-Mar-1945 (75 y.o. [name], [name] Primary Care Provider: [name] Other Clinician: Referring Provider: Treating Provider/Extender: [name] in Treatment: 20 Diagnosis Coding ICD-10 Codes Code Description [phone] 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ity I87.2 Venous insufficiency (chronic) (peripheral) I48.19 Other persistent atrial  fibrillation Z79.01 Long term (current) use of anticoagulants I50.40 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 failure M1A.9XX1 Chronic gout, unspecified, 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, stage 4 (severe) Facility Procedures CPT4 Code: [id number] Description: 16837 - WOUND CARE VISIT-LEV 3 EST PT Modifier: Quantity: 1 Physician Procedures : CPT4 Code Description Modifier [id number] - WC PHYS LEVEL 3 - EST PT ICD-10 Diagnosis Description L97.829 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ity I87.2 Venous insufficiency (chronic) (peripheral) I50.40  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 failure N18.4 Chronic kidney disease, stage 4 (severe) Quantity: 1 Electronic Signature(s) Signed: 10/06/2020 4:24:30 PM By: Kalman Shan DO Entered By: Kalman Shan on 10/06/2020 16:24:10

## 2020-10-20 DIAGNOSIS — M7989 Other specified soft tissue disorders: Secondary | ICD-10-CM | POA: Diagnosis not present

## 2020-10-20 DIAGNOSIS — R600 Localized edema: Secondary | ICD-10-CM | POA: Diagnosis not present

## 2020-10-20 DIAGNOSIS — I509 Heart failure, unspecified: Secondary | ICD-10-CM | POA: Diagnosis not present

## 2020-10-20 DIAGNOSIS — N1831 Chronic kidney disease, stage 3a: Secondary | ICD-10-CM | POA: Diagnosis not present

## 2020-10-20 DIAGNOSIS — I878 Other specified disorders of veins: Secondary | ICD-10-CM | POA: Diagnosis not present

## 2020-10-20 DIAGNOSIS — M1A9XX1 Chronic gout, unspecified, with tophus (tophi): Secondary | ICD-10-CM | POA: Diagnosis not present

## 2020-10-20 DIAGNOSIS — M109 Gout, unspecified: Secondary | ICD-10-CM | POA: Diagnosis not present

## 2020-10-20 DIAGNOSIS — Z79899 Other long term (current) drug therapy: Secondary | ICD-10-CM | POA: Diagnosis not present

## 2020-11-01 ENCOUNTER — Other Ambulatory Visit: Payer: Self-pay | Admitting: Interventional Cardiology

## 2020-11-01 DIAGNOSIS — I4819 Other persistent atrial fibrillation: Secondary | ICD-10-CM

## 2020-11-03 ENCOUNTER — Encounter (HOSPITAL_BASED_OUTPATIENT_CLINIC_OR_DEPARTMENT_OTHER): Payer: Medicare Other | Attending: Internal Medicine | Admitting: Internal Medicine

## 2020-11-03 ENCOUNTER — Other Ambulatory Visit: Payer: Self-pay

## 2020-11-03 DIAGNOSIS — N184 Chronic kidney disease, stage 4 (severe): Secondary | ICD-10-CM | POA: Insufficient documentation

## 2020-11-03 DIAGNOSIS — Z96653 Presence of artificial knee joint, bilateral: Secondary | ICD-10-CM | POA: Diagnosis not present

## 2020-11-03 DIAGNOSIS — Z6841 Body Mass Index (BMI) 40.0 and over, adult: Secondary | ICD-10-CM | POA: Diagnosis not present

## 2020-11-03 DIAGNOSIS — Z7901 Long term (current) use of anticoagulants: Secondary | ICD-10-CM | POA: Insufficient documentation

## 2020-11-03 DIAGNOSIS — M1A9XX1 Chronic gout, unspecified, with tophus (tophi): Secondary | ICD-10-CM | POA: Insufficient documentation

## 2020-11-03 DIAGNOSIS — I504 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 failure: Secondary | ICD-10-CM | POA: Diagnosis not present

## 2020-11-03 DIAGNOSIS — I872 Venous insufficiency (chronic) (peripheral): Secondary | ICD-10-CM | POA: Insufficient documentation

## 2020-11-03 DIAGNOSIS — M1990 Unspecified osteoarthritis, unspecified site: Secondary | ICD-10-CM | POA: Insufficient documentation

## 2020-11-03 DIAGNOSIS — I130 Hypertensive heart and chronic kidney disease with heart failure and stage 1 through stage 4 chronic kidney disease, or unspecified chronic kidney disease: Secondary | ICD-10-CM | POA: Diagnosis not present

## 2020-11-03 DIAGNOSIS — I4819 Other persistent atrial fibrillation: Secondary | ICD-10-CM | POA: Diagnosis not present

## 2020-11-03 DIAGNOSIS — L97829 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ity: Secondary | ICD-10-CM | POA: Insufficient documentation

## 2020-11-03 NOTE — Progress Notes (Addendum)
RAMIE, ERMAN ([id number]) Visit Report for 11/03/2020 Arrival Information Details Patient Name: Date of Service: Harry Foster 11/03/2020 2:30 PM Medical Record Number: [id number] Patient Account Number: [id number] Date of Birth/Sex: Treating RN: [birth year] (75 y.o. [name] Primary Care Dandre Sisler: [name] Other Clinician: Referring Garlene Apperson: Treating Markeya Mincy/Extender: [name] in Treatment: 24 Visit Information History Since Last Visit Added or deleted any medications: No Patient Arrived: Ambulatory Any new allergies or adverse reactions: No Arrival Time: 14:59 Had a fall or experienced change in No Transfer Assistance: None activities of daily living that may affect Patient Identification Verified: Yes risk of falls: Secondary Verification Process Completed: Yes Signs or symptoms of abuse/neglect since last visito No Patient Requires Transmission-Based Precautions: No Hospitalized since last visit: No Patient Has Alerts: Yes Implantable device outside of the clinic excluding No Patient Alerts: R ABI: 1.63 TBI: 1.36 cellular tissue based products placed in the center L ABI: 1.49 TBI: 1.34 since last visit: 05/2020 Has Dressing in Place as Prescribed: Yes Pain Present Now: No Electronic Signature(s) Signed: 11/03/2020 6:31:34 PM By: Lorrin Jackson Entered By: Lorrin Jackson on 11/03/2020 15:04:16 -------------------------------------------------------------------------------- Clinic Level of Care Assessment Details Patient Name: Date of Service: Harry Foster 11/03/2020 2:30 PM Medical Record Number: [id number] Patient Account Number: [id number] Date of Birth/Sex: Treating RN: 08/16/1945 (75 y.o. [name] Primary Care Palma Buster: [name] Other Clinician: Referring Lysha Schrade: Treating Queena Monrreal/Extender: [name] in Treatment: 24 Clinic Level of Care  Assessment Items TOOL 4 Quantity Score X- 1 0 Use when only an EandM is performed on FOLLOW-UP visit ASSESSMENTS - Nursing Assessment / Reassessment X- 1 10 Reassessment of Co-morbidities (includes updates in patient status) X- 1 5 Reassessment of Adherence to Treatment Plan ASSESSMENTS - Wound and Skin A ssessment / Reassessment []  - 0 Simple Wound Assessment / Reassessment - one wound []  - 0 Complex Wound Assessment / Reassessment - multiple wounds []  - 0 Dermatologic / Skin Assessment (not related to wound area) ASSESSMENTS - Focused Assessment []  - 0 Circumferential Edema Measurements - multi extremities []  - 0 Nutritional Assessment / Counseling / Intervention []  - 0 Lower Extremity Assessment (monofilament, tuning fork, pulses) []  - 0 Peripheral Arterial Disease Assessment (using hand held doppler) ASSESSMENTS - Ostomy and/or Continence Assessment and Care []  - 0 Incontinence Assessment and Management []  - 0 Ostomy Care Assessment and Management (repouching, etc.) PROCESS - Coordination of Care []  - 0 Simple Patient / Family Education for ongoing care []  - 0 Complex (extensive) Patient / Family Education for ongoing care X- 1 10 Staff obtains Programmer, systems, Records, T Results / Process Orders est []  - 0 Staff telephones HHA, Nursing Homes / Clarify orders / etc []  - 0 Routine Transfer to another Facility (non-emergent condition) []  - 0 Routine Hospital Admission (non-emergent condition) []  - 0 New Admissions / Biomedical engineer / Ordering NPWT Apligraf, etc. , []  - 0 Emergency Hospital Admission (emergent condition) X- 1 10 Simple Discharge Coordination []  - 0 Complex (extensive) Discharge Coordination PROCESS - Special Needs []  - 0 Pediatric / Minor Patient Management []  - 0 Isolation Patient Management []  - 0 Hearing / Language / Visual special needs []  - 0 Assessment of Community assistance (transportation, D/C planning, etc.) []  -  0 Additional assistance / Altered mentation []  - 0 Support Surface(s) Assessment (bed, cushion, seat, etc.) INTERVENTIONS - Wound Cleansing / Measurement []  - 0 Simple Wound Cleansing -  one wound []  - 0 Complex Wound Cleansing - multiple wounds []  - 0 Wound Imaging (photographs - any number of wounds) []  - 0 Wound Tracing (instead of photographs) []  - 0 Simple Wound Measurement - one wound []  - 0 Complex Wound Measurement - multiple wounds INTERVENTIONS - Wound Dressings []  - 0 Small Wound Dressing one or multiple wounds []  - 0 Medium Wound Dressing one or multiple wounds []  - 0 Large Wound Dressing one or multiple wounds []  - 0 Application of Medications - topical []  - 0 Application of Medications - injection INTERVENTIONS - Miscellaneous []  - 0 External ear exam []  - 0 Specimen Collection (cultures, biopsies, blood, body fluids, etc.) []  - 0 Specimen(s) / Culture(s) sent or taken to Lab for analysis []  - 0 Patient Transfer (multiple staff / Civil Service fast streamer / Similar devices) []  - 0 Simple Staple / Suture removal (25 or less) []  - 0 Complex Staple / Suture removal (26 or more) []  - 0 Hypo / Hyperglycemic Management (close monitor of Blood Glucose) []  - 0 Ankle / Brachial Index (ABI) - do not check if billed separately X- 1 5 Vital Signs Has the patient been seen at the hospital within the last three years: Yes Total Score: 40 Level Of Care: New/Established - Level 2 Electronic Signature(s) Signed: 11/03/2020 6:31:34 PM By: Lorrin Jackson Entered By: Lorrin Jackson on 11/03/2020 15:52:20 -------------------------------------------------------------------------------- Encounter Discharge Information Details Patient Name: Date of Service: Harry Foster. 11/03/2020 2:30 PM Medical Record Number: [id number] Patient Account Number: [id number] Date of Birth/Sex: Treating RN: 05-25-1945 (75 y.o. [name] Primary Care Luisa Louk: [name] Other  Clinician: Referring Roshawna Colclasure: Treating Ashutosh Dieguez/Extender: [name] in Treatment: 24 Encounter Discharge Information Items Discharge Condition: Stable Ambulatory Status: Ambulatory Discharge Destination: Home Transportation: Private Auto Schedule Follow-up Appointment: Yes Clinical Summary of Care: Provided on 11/03/2020 Form Type Recipient Paper Patient Patient Electronic Signature(s) Signed: 11/03/2020 6:31:34 PM By: Lorrin Jackson Entered By: Lorrin Jackson on 11/03/2020 15:53:31 -------------------------------------------------------------------------------- Lower Extremity Assessment Details Patient Name: Date of Service: Harry Foster. 11/03/2020 2:30 PM Medical Record Number: [id number] Patient Account Number: [id number] Date of Birth/Sex: Treating RN: 1945-07-18 (75 y.o. [name] Primary Care Alizeh Madril: [name] Other Clinician: Referring Winslow Ederer: Treating Tomothy Eddins/Extender: [name] in Treatment: 24 Edema Assessment Assessed: Shirlyn Goltz: Yes] Patrice Paradise: No] Edema: [Left: Ye] [Right: s] Calf Left: Right: Point of Measurement: 31 cm From Medial Instep 42 cm Ankle Left: Right: Point of Measurement: 10 cm From Medial Instep 27 cm Vascular Assessment Pulses: Dorsalis Pedis Palpable: [Left:Yes] Electronic Signature(s) Signed: 11/03/2020 6:31:34 PM By: Lorrin Jackson Entered By: Lorrin Jackson on 11/03/2020 15:08:09 -------------------------------------------------------------------------------- Multi Wound Chart Details Patient Name: Date of Service: Harry Foster. 11/03/2020 2:30 PM Medical Record Number: [id number] Patient Account Number: [id number] Date of Birth/Sex: Treating RN: [birth year] (75 y.o. [name], [name] Primary Care Mariafernanda Hendricksen: [name] Other Clinician: Referring Mackie Goon: Treating Artesha Wemhoff/Extender: [name]  in Treatment: 24 Vital Signs Height(in): 71 Pulse(bpm): 83 Weight(lbs): 295 Blood Pressure(mmHg): 113/68 Body Mass Index(BMI): 41 Temperature(F): 98.4 Respiratory Rate(breaths/min): 18 Wound Assessments Treatment Notes Electronic Signature(s) Signed: 11/04/2020 2:04:48 PM By: Kalman Shan DO Signed: 11/08/2020 4:45:49 PM By: Rhae Hammock RN Entered By: Kalman Shan on 11/04/2020 13:58:29 -------------------------------------------------------------------------------- Multi-Disciplinary Care Plan Details Patient Name: Date of Service: Harry Foster. 11/03/2020 2:30 PM Medical Record Number: [id number] Patient Account Number: [id number] Date  of Birth/Sex: Treating RN: March 30, 1945 (75 y.o. 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 Compression as ordered Provide education on venous insufficiency Notes: Electronic Signature(s) Signed: 11/03/2020 3:17:24 PM By: Lorrin Jackson Entered By: Lorrin Jackson on 11/03/2020 15:17:23 -------------------------------------------------------------------------------- Pain Assessment Details Patient Name: Date of Service: Harry Foster. 11/03/2020 2:30 PM Medical Record  Number: [id number] Patient Account Number: [id number] Date of Birth/Sex: Treating RN: 04-Jan-1945 (75 y.o. [name] Primary Care Odile Veloso: [name] Other Clinician: Referring Chris Cripps: Treating Johnathon Olden/Extender: [name] in Treatment: 24 Active Problems Location of Pain Severity and Description of Pain Patient Has Paino No Site Locations Pain Management and Medication Current Pain Management: Electronic Signature(s) Signed: 11/03/2020 6:31:34 PM By: Lorrin Jackson Entered By: Lorrin Jackson on 11/03/2020 15:04:55 -------------------------------------------------------------------------------- Patient/Caregiver Education Details Patient Name: Date of Service: Harry Foster 11/3/2022andnbsp2:30 PM Medical Record Number: [id number] Patient Account Number: [id number] Date of Birth/Gender: Treating RN: 09-24-1945 (75 y.o. [name] Primary Care Physician: [name] Other Clinician: Referring Physician: Treating Physician/Extender: [name] in Treatment: 24 Education Assessment Education Provided To: Patient Education Topics Provided Venous: Methods: Explain/Verbal, Printed Responses: State content correctly Wound/Skin Impairment: Methods: Explain/Verbal, Printed Responses: State content correctly Electronic Signature(s) Signed: 11/03/2020 6:31:34 PM By: Lorrin Jackson Entered By: Lorrin Jackson on 11/03/2020 15:18:02 -------------------------------------------------------------------------------- Vitals Details Patient Name: Date of Service: Harry Foster. 11/03/2020 2:30 PM Medical Record Number: [id number] Patient Account Number: [id number] Date of Birth/Sex: Treating RN: 01-27-1945 (75 y.o. [name] Primary Care Brodan Grewell: [name] Other Clinician: Referring Antonieta Slaven: Treating Trenace Coughlin/Extender: [name] in Treatment: 24 Vital Signs Time Taken: 15:02 Temperature (F): 98.4 Height (in): 71 Pulse (bpm): 83 Weight (lbs): 295 Respiratory Rate (breaths/min): 18 Body Mass Index (BMI): 41.1 Blood Pressure (mmHg): 113/68 Reference Range: 80 - 120 mg / dl Electronic Signature(s) Signed: 11/03/2020 6:31:34 PM By: Lorrin Jackson Entered By: Lorrin Jackson on 11/03/2020 15:04:44

## 2020-11-08 NOTE — Progress Notes (Signed)
Harry Foster ([id number]) Visit Report for 11/03/2020 Chief Complaint Document Details Patient Name: Date of Service: Harry Foster 11/03/2020 2:30 PM Medical Record Number: [id number] Patient Account Number: [id number] Date of Birth/Sex: Treating RN: 02-12-1945 (75 y.o. [name], [name] Primary Care Provider: [name] Other Clinician: Referring Provider: Treating Provider/Extender: [name] in Treatment: 24 Information Obtained from: Patient Chief Complaint Left lower extremity skin breakdown Electronic Signature(s) Signed: 11/04/2020 2:04:48 PM By: Kalman Shan DO Entered By: Kalman Shan on 11/04/2020 13:58:35 -------------------------------------------------------------------------------- HPI Details Patient Name: Date of Service: Harry Foster. 11/03/2020 2:30 PM Medical Record Number: [id number] Patient Account Number: [id number] Date of Birth/Sex: Treating RN: [birth year] (75 y.o. [name], [name] Primary Care Provider: [name] Other Clinician: Referring Provider: Treating Provider/Extender: [name] in Treatment: 24 History of Present Illness HPI Description: Harry Foster is a 75 year old male with a past medical history of persistent atrial fibrillation on Coumadin, hypertension, gout, CHF And venous insufficiency that presents to the clinic for bilateral lower extremity wounds. He states he has a history of blistering to his legs that eventually pop and heal. He states that in the past 6 weeks he has been unable to heal his wounds. He keeps them wrapped however they soak through quickly and has to change the wraps frequently. 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 He sleeps in a recliner at night and keeps his legs down due to increased pain when they are elevated. He states this symptom started a few weeks ago.  He denies signs of infection. 5/23; patient presents for 1 week follow-up. He had Kerlix/Coban wrap placed at last clinic visit and changed again last week due to increased drainage. He states that there has been an improvement in swelling and drainage over the past week. He is going to have arterial studies done tomorrow. He denies signs of infection. 5/31; patient presents for 1 week follow-up. He has been doing well with Kerlix/Coban wrap. He denies signs of infection. 6/7; patient presents for 1 week follow-up. He has been using Kerlix/Coban wraps bilaterally with collagen underneath. He has tolerated these well. He denies signs of infection. The right leg no longer has wounds. He has his Harry Foster wraps today 6/14; the patient is effectively healed on the left leg. He already is wearing his Harry Foster wraps on the right leg he has 20/30 stockings below this. He has a stocking for the left leg. He says he is going to go to elastic therapy for 20/30 stockings to put under the Farrow wraps 6/30; patient was discharged from the clinic 2 weeks ago for closed wounds. He reports using Farrow wraps bilaterally. He says he noticed in the past week he was developing new wounds. He denies signs of infection. 7/12; patient presents for follow-up. He has been tolerating the Kerlix/Coban well. When the wrap was taken off today he noticed a blister and this opened in the clinic. He denies signs of infection. 7/21. The patient arrives healed. Previously followed by Dr. Heber Foster. He has severe chronic venous insufficiency. The blister he noted last week on the left anterior leg is closed. He has Farrow wrap stockings he is familiar with these. I cannot really determine from him today whether he was actually using these when these wounds formed. Readmission 8/1 Patient presents again for skin breakdown to his left lower extremity. He was discharged less  than 2 weeks ago for a wound to his left lower extremity. He states  that on discharge the following day he had a blister that opened and popped. Its been healing since. He has been tightly wrapping his leg from the ankle up and placing a Farrow wrap over this. He currently denies signs of infection. 8/15; patient presents for 2-week follow-up. He states that his diuretic was increased recently by his cardiologist. He reports improvement to his lower extremity swelling. He states that the wounds are healed. Readmission 9/12 Patient presents again for left lower extremity wound limited to skin breakdown. He has been using Mepilex light dressings with antibiotic ointment. He continues to use his Farrow wrap. He is on 120 mg of Lasix daily. He denies signs of infection. 9/19; patient presents for follow-up. He has been using Mepilex light dressings with antibiotic ointment under Farrow wraps. He reports improvement in wound healing. He is scheduled for venous reflux studies this week. He has no issues or complaints today. He denies signs of infection. 10/6; patient presents for follow-up. He has no issues or complaints today. He reports that the wounds are closed. He would like to try 20-30 compression stockings and give the Farrow wrap a break. 11/3; patient presents for follow-up. He states that 2 weeks ago the blister had appeared in he debrided it and put a dressing on it under compression therapy. It is healed over. He states that he is scheduled to see vein and vascular next week. He currently denies signs of infection. Electronic Signature(s) Signed: 11/04/2020 2:04:48 PM By: Kalman Shan DO Entered By: Kalman Shan on 11/04/2020 13:59:30 -------------------------------------------------------------------------------- Physical Exam Details Patient Name: Date of Service: Harry Foster 11/03/2020 2:30 PM Medical Record Number: [id number] Patient Account Number: [id number] Date of Birth/Sex: Treating RN: March 26, 1945 (75 y.o. Erie Noe Primary Care Provider: [name] Other Clinician: Referring Provider: Treating Provider/Extender: [name] in Treatment: 24 Constitutional respirations regular, non-labored and within target range for patient.Marland Kitchen Psychiatric pleasant and cooperative. Notes Left lower extremity: No open wounds. Good edema control Electronic Signature(s) Signed: 11/04/2020 2:04:48 PM By: Kalman Shan DO Entered By: Kalman Shan on 11/04/2020 14:00:48 -------------------------------------------------------------------------------- Physician Orders Details Patient Name: Date of Service: Harry Foster 11/03/2020 2:30 PM Medical Record Number: [id number] Patient Account Number: [id number] Date of Birth/Sex: Treating RN: [birth year] (75 y.o. 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 - with Dr. Deeann Cree up for post heal check Bathing/ Shower/ Hygiene May shower and wash wound with soap and water. Edema Control - Lymphedema / SCD / Other Compression stocking or Garment 20-30 mm/Hg pressure to: - Continue to wear Francia Greaves daily: apply in morning and remove at bedtime. Electronic Signature(s) Signed: 11/04/2020 2:04:48 PM By: Kalman Shan  DO Previous Signature: 11/03/2020 6:31:34 PM Version By: Lorrin Jackson Entered By: Kalman Shan on 11/04/2020 14:02:07 -------------------------------------------------------------------------------- Problem List Details Patient Name: Date of Service: Harry Foster, Harry RRY  G. 11/03/2020 2:30 PM Medical Record Number: [id number] Patient Account Number: [id number] Date of Birth/Sex: Treating RN: [birth year] (75 y.o. [name] Primary Care Provider: [name] Other Clinician: Referring Provider: Treating Provider/Extender: [name] in Treatment: 24 Active Problems ICD-10 Encounter Code Description Active Date MDM Diagnosis L97.829 Non-pressure chronic ulcer of other part of left lower leg with unspecified 05/16/2020 No Yes severity I87.2 Venous insufficiency (chronic) (peripheral) 05/16/2020 No Yes I48.19 Other persistent atrial fibrillation 05/16/2020 No Yes Z79.01 Long term (current) use of anticoagulants 05/16/2020 No Yes I50.40 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 05/16/2020 No Yes failure M1A.9XX1 Chronic gout, unspecified, with tophus (tophi) 05/16/2020 No Yes N18.4 Chronic kidney disease, stage 4 (severe) 05/16/2020 No Yes Inactive Problems Resolved Problems ICD-10 Code Description Active Date Resolved Date L97.919 Non-pressure chronic ulcer of unspecified part of right lower leg with unspecified 05/16/2020 05/16/2020 severity Electronic Signature(s) Signed: 11/04/2020 2:04:48 PM By: Kalman Shan DO Previous Signature: 11/03/2020 3:16:45 PM Version By: Lorrin Jackson Entered By: Kalman Shan on 11/04/2020 13:58:25 -------------------------------------------------------------------------------- Progress Note Details Patient Name: Date of Service: Harry Foster. 11/03/2020 2:30 PM Medical Record Number: [id number] Patient Account Number: [id number] Date of Birth/Sex: Treating  RN: 09/19/1945 (75 y.o. [name], [name] Primary Care Provider: [name] Other Clinician: Referring Provider: Treating Provider/Extender: [name] in Treatment: 24 Subjective Chief Complaint Information obtained from Patient Left lower extremity skin breakdown History of Present Illness (HPI) Mr. Yishai Rehfeld Foster is a 75 year old male with a past medical history of persistent atrial fibrillation on Coumadin, hypertension, gout, CHF And venous insufficiency that presents to the clinic for bilateral lower extremity wounds. He states he has a history of blistering to his legs that eventually pop and heal. He states that in the past 6 weeks he has been unable to heal his wounds. He keeps them wrapped however they soak through quickly and has to change the wraps frequently. 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 He sleeps in a recliner at night and keeps his legs down due to increased pain when they are elevated. He states this symptom started a few weeks ago. He denies signs of infection. 5/23; patient presents for 1 week follow-up. He had Kerlix/Coban wrap placed at last clinic visit and changed again last week due to increased drainage. He states that there has been an improvement in swelling and drainage over the past week. He is going to have arterial studies done tomorrow. He denies signs of infection. 5/31; patient presents for 1 week follow-up. He has been doing well with Kerlix/Coban wrap. He denies signs of infection. 6/7; patient presents for 1 week follow-up. He has been using Kerlix/Coban wraps bilaterally with collagen underneath. He has tolerated these well. He denies signs of infection. The right leg no longer has wounds. He has his Harry Foster wraps today 6/14; the patient is effectively healed on the left leg. He already is wearing his Harry Foster wraps on the right leg he has 20/30 stockings below  this. He has a stocking for the left leg. He says he is going to go to elastic therapy for 20/30 stockings to put under the Farrow wraps 6/30; patient was discharged from the clinic 2 weeks ago for closed wounds. He reports using Farrow wraps bilaterally. He says he noticed in the past week he was developing new wounds. He denies signs of infection. 7/12; patient presents for  follow-up. He has been tolerating the Kerlix/Coban well. When the wrap was taken off today he noticed a blister and this opened in the clinic. He denies signs of infection. 7/21. The patient arrives healed. Previously followed by Dr. Heber [place]. He has severe chronic venous insufficiency. The blister he noted last week on the left anterior leg is closed. He has Farrow wrap stockings he is familiar with these. I cannot really determine from him today whether he was actually using these when these wounds formed. Readmission 8/1 Patient presents again for skin breakdown to his left lower extremity. He was discharged less than 2 weeks ago for a wound to his left lower extremity. He states that on discharge the following day he had a blister that opened and popped. Its been healing since. He has been tightly wrapping his leg from the ankle up and placing a Farrow wrap over this. He currently denies signs of infection. 8/15; patient presents for 2-week follow-up. He states that his diuretic was increased recently by his cardiologist. He reports improvement to his lower extremity swelling. He states that the wounds are healed. Readmission 9/12 Patient presents again for left lower extremity wound limited to skin breakdown. He has been using Mepilex light dressings with antibiotic ointment. He continues to use his Farrow wrap. He is on 120 mg of Lasix daily. He denies signs of infection. 9/19; patient presents for follow-up. He has been using Mepilex light dressings with antibiotic ointment under Farrow wraps. He reports improvement in  wound healing. He is scheduled for venous reflux studies this week. He has no issues or complaints today. He denies signs of infection. 10/6; patient presents for follow-up. He has no issues or complaints today. He reports that the wounds are closed. He would like to try 20-30 compression stockings and give the Farrow wrap a break. 11/3; patient presents for follow-up. He states that 2 weeks ago the blister had appeared in he debrided it and put a dressing on it under compression therapy. It is healed over. He states that he is scheduled to see vein and vascular next week. He currently denies signs of infection. Patient History Information obtained from Patient. Family History Cancer - Mother,Siblings, Heart Disease - Father, No family history of Diabetes, Hereditary Spherocytosis, Hypertension, Kidney Disease, Lung Disease, Seizures, Stroke, Thyroid Problems, Tuberculosis. Social History Never smoker, Marital Status - Widowed, Alcohol Use - Never, Drug Use - No History, Caffeine Use - Daily - coffee. Medical History Eyes Patient has history of Cataracts - mild Denies history of Glaucoma, Optic Neuritis Ear/Nose/Mouth/Throat Denies history of Chronic sinus problems/congestion, Middle ear problems Cardiovascular Patient has history of Arrhythmia - afib, Congestive Heart Failure, Hypertension, Peripheral Venous Disease Endocrine Denies history of Type I Diabetes, Type II Diabetes Genitourinary Denies history of End Stage Renal Disease Integumentary (Skin) Denies history of History of Burn Musculoskeletal Patient has history of Gout, Osteoarthritis Denies history of Rheumatoid Arthritis, Osteomyelitis Oncologic Denies history of Received Chemotherapy, Received Radiation Psychiatric Denies history of Anorexia/bulimia, Confinement Anxiety Hospitalization/Surgery History - bil knee replacements. Medical A Surgical History Notes nd Constitutional Symptoms (General Health) morbid  obesity Genitourinary CKD stage 3 Objective Constitutional respirations regular, non-labored and within target range for patient.. Vitals Time Taken: 3:02 PM, Height: 71 in, Weight: 295 lbs, BMI: 41.1, Temperature: 98.4 F, Pulse: 83 bpm, Respiratory Rate: 18 breaths/min, Blood Pressure: 113/68 mmHg. Psychiatric pleasant and cooperative. General Notes: Left lower extremity: No open wounds. 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 He actually did develop a wound but it is healed. There are no open wounds today. He is going to see vein and vascular on 11/09/2020. No signs of infection on exam. Follow-up in 1 month Plan Follow-up Appointments: Return appointment in 1 month. - with Dr. Deeann Cree up for post heal check Bathing/ Shower/ Hygiene: May shower and wash wound with soap and water. Edema Control - Lymphedema / SCD / Other: Compression stocking or Garment 20-30 mm/Hg pressure to: - Continue to wear Francia Greaves daily: apply in morning and remove at bedtime. 1. Continue to use compression therapy 2. Follow-up in 1 month Electronic Signature(s) Signed: 11/04/2020 2:04:48 PM By: Kalman Shan DO Entered By: Kalman Shan on 11/04/2020 14:03:59 -------------------------------------------------------------------------------- HxROS Details Patient Name: Date of Service: Harry Leventhal G. 11/03/2020 2:30 PM Medical Record Number: [id number] Patient Account Number: [id number] Date of  Birth/Sex: Treating RN: [birth year] (75 y.o. [name], [name] Primary Care Provider: [name] Other Clinician: Referring Provider: Treating Provider/Extender: [name] in Treatment: 24 Information Obtained From Patient Constitutional Symptoms (General Health) Medical History: Past Medical History Notes: morbid obesity Eyes Medical History: Positive for: Cataracts - mild Negative for: Glaucoma; Optic Neuritis Ear/Nose/Mouth/Throat Medical History: Negative for: Chronic sinus problems/congestion; Middle ear problems Cardiovascular Medical History: Positive for: Arrhythmia - afib; Congestive Heart Failure; Hypertension; Peripheral Venous Disease Endocrine Medical History: Negative for: Type I Diabetes; Type II Diabetes Genitourinary Medical History: Negative for: End Stage Renal Disease Past Medical History Notes: CKD stage 3 Integumentary (Skin) Medical History: Negative for: History of Burn Musculoskeletal Medical History: Positive for: Gout; Osteoarthritis Negative for: Rheumatoid Arthritis; Osteomyelitis Oncologic Medical History: Negative for: Received Chemotherapy; Received Radiation Psychiatric Medical History: Negative for: Anorexia/bulimia; Confinement Anxiety HBO Extended History Items Eyes: Cataracts Immunizations Pneumococcal Vaccine: Received Pneumococcal Vaccination: Yes Received Pneumococcal Vaccination On or After 60th Birthday: No Implantable Devices No devices added Hospitalization / Surgery History Type of Hospitalization/Surgery bil knee replacements Family and Social History Cancer: Yes - Mother,Siblings; Diabetes: No; Heart Disease: Yes - Father; Hereditary Spherocytosis: No; Hypertension: No; Kidney Disease: No; Lung Disease: No; Seizures: No; Stroke: No; Thyroid Problems: No; Tuberculosis: No; Never smoker; Marital Status - Widowed; Alcohol Use: Never; Drug Use: No History; Caffeine Use:  Daily - coffee; Financial Concerns: No; Food, Clothing or Shelter Needs: No; Support System Lacking: No; Transportation Concerns: No Electronic Signature(s) Signed: 11/04/2020 2:04:48 PM By: Kalman Shan DO Signed: 11/08/2020 4:45:49 PM By: Rhae Hammock RN Entered By: Kalman Shan on 11/04/2020 13:59:35 -------------------------------------------------------------------------------- SuperBill Details Patient Name: Date of Service: Harry Foster 11/03/2020 Medical Record Number: [id number] Patient Account Number: [id number] Date of Birth/Sex: Treating RN: July 26, 1945 (75 y.o. 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 failure M1A.9XX1 Chronic gout, unspecified, with tophus (tophi) N18.4  Chronic kidney disease, stage 4 (severe) Facility Procedures CPT4 Code: [id number] Description: [phone] - WOUND CARE VISIT-LEV 2 EST PT Modifier: Quantity: 1 Physician Procedures : CPT4 Code Description Modifier [id number] - WC PHYS LEVEL 3 - EST PT ICD-10 Diagnosis Description L97.829 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ity I87.2 Venous insufficiency (chronic) (peripheral) I50.40  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 failure N18.4 Chronic kidney disease, stage 4 (severe) Quantity: 1 Electronic Signature(s) Signed: 11/04/2020 2:04:48 PM By: Kalman Shan DO Previous Signature: 11/03/2020 6:31:34 PM  Version By: Lorrin Jackson Entered By: Kalman Shan on 11/04/2020 14:04:27

## 2020-11-09 ENCOUNTER — Other Ambulatory Visit: Payer: Self-pay

## 2020-11-09 ENCOUNTER — Ambulatory Visit (INDEPENDENT_AMBULATORY_CARE_PROVIDER_SITE_OTHER): Payer: Medicare Other | Admitting: Physician Assistant

## 2020-11-09 VITALS — BP 90/64 | HR 69 | Temp 98.3°F | Resp 20 | Ht 71.0 in | Wt 294.3 lb

## 2020-11-09 DIAGNOSIS — I872 Venous insufficiency (chronic) (peripheral): Secondary | ICD-10-CM

## 2020-11-09 DIAGNOSIS — N1831 Chronic kidney disease, stage 3a: Secondary | ICD-10-CM | POA: Insufficient documentation

## 2020-11-09 DIAGNOSIS — I509 Heart failure, unspecified: Secondary | ICD-10-CM | POA: Insufficient documentation

## 2020-11-09 DIAGNOSIS — M109 Gout, unspecified: Secondary | ICD-10-CM | POA: Insufficient documentation

## 2020-11-09 NOTE — Progress Notes (Signed)
[facility] [place]   Reason for referral: Swollen B leg, chronic venous stasis ulcers  History of Present Illness  Harry Foster is a 75 y.o. male who presents with chief complaint: swollen leg.  Patient notes, onset of swelling and on/off chronic venous stasis wounds for 4-5 years, the chronic edema started 11 years ago with skin discoloration.  He has been followed by the wound care center for > 24 weeks recently and prior treatments over the years.  The edema is associated with prolonged sitting and standing.   The patient has had no history of DVT, no history of varicose vein, positive history of venous stasis ulcers, no history of  Lymphedema  and positive history of skin changes in lower legs.  There is positive family history of venous disorders.  The patient has  used compression stockings in the past.  Past Medical History:  Diagnosis Date   Arthritis    both knees and hands and left elbow, hip   Depression    treated for depression when wife was very ill   Diverticulosis    hx - no sx   Dysrhythmia    hx A fib, on anticoagulants   GI bleed 2010   GI bleed    hx   Hypertension    Recurrent upper respiratory infection (URI)    completing treatment for sinus infection - with abx    Past Surgical History:  Procedure Laterality Date   CARDIOVERSION  02/01/2011   Procedure: CARDIOVERSION;  Surgeon: Sinclair Grooms, MD;  Location: [facility] OR;  Service: Cardiovascular;  Laterality: N/A;   SINUS EXPLORATION  1992   laser sinus surgery   TONSILLECTOMY     age 58   TOTAL KNEE ARTHROPLASTY  10/09/2011   Procedure: TOTAL KNEE ARTHROPLASTY;  Surgeon: Hessie Dibble, MD;  Location: [place];  Service: Orthopedics;  Laterality: Left;  left total knee arthroplasty   TOTAL KNEE ARTHROPLASTY Right 10/06/2013   DR DALLDORF   TOTAL KNEE ARTHROPLASTY WITH REVISION COMPONENTS Right 10/06/2013   Procedure: TOTAL KNEE ARTHROPLASTY WITH REVISION COMPONENTS;  Surgeon: Hessie Dibble, MD;  Location: [place];  Service: Orthopedics;  Laterality: Right;   TREATMENT FISTULA ANAL  1992    Social History   Socioeconomic History   Marital status: Widowed    Spouse name: Not on file   Number of children: Not on file   Years of education: Not on file   Highest education level: Not on file  Occupational History   Not on file  Tobacco Use   Smoking status: Never   Smokeless tobacco: Never  Substance and Sexual Activity   Alcohol use: No   Drug use: No   Sexual activity: Not on file  Other Topics Concern   Not on file  Social History Narrative   Not on file   Social Determinants of Health   Financial Resource Strain: Not on file  Food Insecurity: Not on file  Transportation Needs: Not on file  Physical Activity: Not on file  Stress: Not on file  Social Connections: Not on file  Intimate Partner Violence: Not on file    Family History  Problem Relation Age of Onset   Heart disease Mother    Heart disease Father     Current Outpatient Medications on File Prior to Visit  Medication Sig Dispense Refill   allopurinol (ZYLOPRIM) 100 MG tablet Take 200 mg by mouth daily.  amoxicillin (AMOXIL) 500 MG capsule 1 capsule     ascorbic acid (VITAMIN C) 1000 MG tablet Take 1,000 mg by mouth daily.     aspirin 81 MG EC tablet Take 81 mg by mouth daily. Swallow whole.     chlorhexidine (PERIDEX) 0.12 % solution SMARTSIG:By Mouth     Coenzyme Q10 (CO Q 10) 100 MG CAPS Take 100 mg by mouth daily.     colchicine 0.6 MG tablet Take 0.6 mg by mouth 2 (two) times a week.     DHEA 50 MG TABS Take 50 mg by mouth daily.     digoxin (LANOXIN) 0.125 MG tablet TAKE 1 TABLET BY MOUTH EVERY MONDAY, WEDNESDAY, AND FRIDAY. 45 tablet 2   furosemide (LASIX) 40 MG tablet Take 3 tablets (120 mg total) by mouth daily. 90 tablet 3   Lactobacillus (ACIDOPHILUS/BIFIDUS PO) Take 100 mg by mouth daily.     Loperamide HCl (IMODIUM PO) Take 1 tablet by mouth daily as needed (loose  stools).      magnesium gluconate (MAGONATE) 500 MG tablet Take 250 mg by mouth daily.     metoprolol succinate (TOPROL-XL) 50 MG 24 hr tablet TAKE 2 TABLETS BY MOUTH 2 TIMES DAILY. TAKE WITH OR IMMEDIATELY FOLLOWING A MEAL. 180 tablet 3   Misc Natural Products (TART CHERRY ADVANCED) CAPS Take 2 capsules by mouth daily.     Multiple Vitamin (MULTIVITAMIN WITH MINERALS) TABS Take 2 tablets by mouth daily.     OLIVE LEAF EXTRACT PO Take 1 tablet by mouth daily.     OVER THE COUNTER MEDICATION Take 1 capsule by mouth daily. Berry extract     OVER THE COUNTER MEDICATION Take 1 capsule by mouth daily. Vitamin d 5000 units w/ iodine 1014mcg     Pomegranate, Punica granatum, (POMEGRANATE PO) Take 250 mg by mouth daily.     Saw Palmetto, Serenoa repens, (SAW PALMETTO PO) Take 1,000 mg by mouth daily.     traMADol (ULTRAM) 50 MG tablet Take 50 mg by mouth every 4 (four) hours as needed.     triamcinolone (NASACORT) 55 MCG/ACT nasal inhaler Place 1 spray into both nostrils daily as needed (allergies).     Vit C-Cholecalciferol-Rose Hip [phone] MG-UNIT-MG CAPS Take 350 mg by mouth daily.     VITAMIN E PO Take 360 mg by mouth daily.     warfarin (COUMADIN) 5 MG tablet TAKE 1 TO 2 TABLETS BY MOUTH DAILY OR AS DIRECTED BY THE COUMADIN CLINIC. 40 tablet 2   olmesartan (BENICAR) 20 MG tablet Take 20 mg by mouth daily.     No current facility-administered medications on file prior to visit.    Allergies as of 11/09/2020 - Review Complete 08/08/2020  Allergen Reaction Noted   Hydrochlorothiazide  02/08/2020   Levaquin [levofloxacin in d5w]  09/28/2013   Lisinopril Cough 01/19/2011   Quinolones  02/08/2020     ROS:   General:  No weight loss, Fever, chills  HEENT: No recent headaches, no nasal bleeding, no visual changes, no sore throat  Neurologic: No dizziness, blackouts, seizures. No recent symptoms of stroke or mini- stroke. No recent episodes of slurred speech, or temporary  blindness.  Cardiac: No recent episodes of chest pain/pressure, no shortness of breath at rest.  No shortness of breath with exertion.  Denies history of atrial fibrillation or irregular heartbeat  Vascular: No history of rest pain in feet.  No history of claudication.  positve history of non-healing ulcer, No history of DVT  Pulmonary: No home oxygen, no productive cough, no hemoptysis,  No asthma or wheezing  Musculoskeletal:  [ ]  Arthritis, [ ]  Low back pain,  [ ]  Joint pain  Hematologic:No history of hypercoagulable state.  No history of easy bleeding.  No history of anemia  Gastrointestinal: No hematochezia or melena,  No gastroesophageal reflux, no trouble swallowing  Urinary: [ ]  chronic Kidney disease, [ ]  on HD - [ ]  MWF or [ ]  TTHS, [ ]  Burning with urination, [ ]  Frequent urination, [ ]  Difficulty urinating;   Skin: No rashes  Psychological: No history of anxiety,  No history of depression  Physical Examination  Vitals:   11/09/20 1435  BP: 90/64  Pulse: 69  Resp: 20  Temp: 98.3 F (36.8 C)  TempSrc: Temporal  SpO2: 94%  Weight: 294 lb 4.8 oz (133.5 kg)  Height: 5\' 11"  (1.803 m)    Body mass index is 41.05 kg/m.  General:  Alert and oriented, no acute distress HEENT: Normal Neck: No bruit or JVD Pulmonary: Clear to auscultation bilaterally Cardiac: Regular Rate and Rhythm without murmur Abdomen: Soft, non-tender, non-distended, no mass, no scars Skin: No rash    Evidence of varicosities  Extremity Pulses:  2+ radial, brachial, femoral, dorsalis pedis, posterior tibial pulses bilaterally Musculoskeletal: edema B LE with brawny skin changes and evidence of previous wounds.    Neurologic: Upper and lower extremity motor 5/5 and symmetric  DATA:   09/23/20 Venous Reflux Times  +--------------+---------+------+-----------+------------+--------+  RIGHT         Reflux NoRefluxReflux TimeDiameter cmsComments                          Yes                                    +--------------+---------+------+-----------+------------+--------+  CFV                     yes   >1 second                       +--------------+---------+------+-----------+------------+--------+  FV mid                  yes   >1 second                       +--------------+---------+------+-----------+------------+--------+  Popliteal     no                                              +--------------+---------+------+-----------+------------+--------+  GSV at SFJ              yes    >500 ms      0.87              +--------------+---------+------+-----------+------------+--------+  GSV prox thigh          yes    >500 ms      1.14              +--------------+---------+------+-----------+------------+--------+  GSV mid thigh           yes    >500 ms      0.82              +--------------+---------+------+-----------+------------+--------+  GSV dist thigh          yes    >500 ms      0.49              +--------------+---------+------+-----------+------------+--------+  GSV at knee             yes    >500 ms      0.83              +--------------+---------+------+-----------+------------+--------+  GSV prox calf           yes    >500 ms      0.72              +--------------+---------+------+-----------+------------+--------+  SSV Pop Fossa no                            0.4               +--------------+---------+------+-----------+------------+--------+  SSV prox calf no                            0.43              +--------------+---------+------+-----------+------------+--------+  SSV mid calf  no                            0.49              +--------------+---------+------+-----------+------------+--------+      +--------------+---------+------+-----------+------------+--------+  LEFT          Reflux NoRefluxReflux TimeDiameter cmsComments                           Yes                                   +--------------+---------+------+-----------+------------+--------+  CFV                     yes   >1 second                       +--------------+---------+------+-----------+------------+--------+  FV mid                  yes   >1 second                       +--------------+---------+------+-----------+------------+--------+  Popliteal     no                                              +--------------+---------+------+-----------+------------+--------+  GSV at SFJ              yes    >500 ms      0.82              +--------------+---------+------+-----------+------------+--------+  GSV prox thigh          yes    >500 ms      0.87              +--------------+---------+------+-----------+------------+--------+  GSV mid thigh           yes    >500 ms  0.49              +--------------+---------+------+-----------+------------+--------+  GSV dist thighno                            0.42              +--------------+---------+------+-----------+------------+--------+  GSV at knee             yes    >500 ms      0.58              +--------------+---------+------+-----------+------------+--------+  GSV prox calf           yes    >500 ms      0.62              +--------------+---------+------+-----------+------------+--------+  SSV Pop Fossa no                            0.2               +--------------+---------+------+-----------+------------+--------+  SSV prox calf no                            0.33              +--------------+---------+------+-----------+------------+--------+  SSV mid calf  no                            0.34              +--------------+---------+------+-----------+------------+--------+     05/24/20 ABI/TBIToday's ABIToday's TBIPrevious ABIPrevious TBI  +-------+-----------+-----------+------------+------------+  Right  1.63        1.36                                 +-------+-----------+-----------+------------+------------+  Left   1.49       1.34                                 +-------+-----------+-----------+------------+------------+   Assessment/Plan:  Chronic venous insufficiency with 11 year history 4-5 year history of venous ulcers/blisters.  Brawny skin changes B LE. He has deep and GSV reflux including the SFJ B LE.  His reflux is from the SFJ to the popliteal and the vein size is > 0.4.    He has palpable DP pulse and is not at risk of limb loss.  He wears 20-30 mm HG knee high compression socks with farrow wraps over them for extra compression.  He is in the process of medial left ankle wound healing and is followed at the wound center.  He sleeps in a recliner.  I gave a hand out to demonstrate the proper way to elevate his legs.     He will follow up with our vein clinic to discuss treatments to include possible laser ablation therapy.  He will need to be measured for thigh high compression at his f/u visit if laser ablation is indicated.    Roxy Horseman PA-C [facility] [place] Office: [phone]  MD in clinic [place]

## 2020-11-10 ENCOUNTER — Ambulatory Visit (INDEPENDENT_AMBULATORY_CARE_PROVIDER_SITE_OTHER): Payer: Medicare Other

## 2020-11-10 DIAGNOSIS — I4819 Other persistent atrial fibrillation: Secondary | ICD-10-CM | POA: Diagnosis not present

## 2020-11-10 DIAGNOSIS — Z5181 Encounter for therapeutic drug level monitoring: Secondary | ICD-10-CM | POA: Diagnosis not present

## 2020-11-10 LAB — POCT INR: INR: 2.3 (ref 2.0–3.0)

## 2020-11-10 NOTE — Patient Instructions (Signed)
Description   Continue taking Warfarin 1 tablet daily except for 2 tablets on Fridays. Recheck INR in 6 weeks. Call Coumadin Clinic for any questions/updates at [phone].

## 2020-12-01 ENCOUNTER — Encounter (HOSPITAL_BASED_OUTPATIENT_CLINIC_OR_DEPARTMENT_OTHER): Payer: Medicare Other | Admitting: Internal Medicine

## 2020-12-14 ENCOUNTER — Other Ambulatory Visit: Payer: Self-pay

## 2020-12-14 ENCOUNTER — Ambulatory Visit (INDEPENDENT_AMBULATORY_CARE_PROVIDER_SITE_OTHER): Payer: Medicare Other | Admitting: Vascular Surgery

## 2020-12-14 ENCOUNTER — Encounter: Payer: Self-pay | Admitting: Vascular Surgery

## 2020-12-14 VITALS — BP 109/71 | HR 73 | Temp 98.2°F | Resp 20 | Ht 71.0 in | Wt 293.9 lb

## 2020-12-14 DIAGNOSIS — I872 Venous insufficiency (chronic) (peripheral): Secondary | ICD-10-CM

## 2020-12-14 NOTE — Progress Notes (Signed)
Patient ID: Harry Foster, male   DOB: Mar 24, 1945, 75 y.o.   MRN: [id number]  Reason for Consult: Follow-up   Referred by Josetta Huddle, MD  Subjective:     HPI:  Harry Foster is a 75 y.o. male with history of bilateral lower extremity swelling and bilateral venous stasis ulceration.  He has been followed at the wound care center for multiple years intermittently.  More recently he has had ulceration of the left medial malleolus.  This was treated at the wound care center initially and then with the use of compression wraps himself.  The wound is now healed.  He states that he has been elevating his legs and this is helped with the swelling more than anything.  He is also on Lasix.  He does not currently have any ulceration.  He does walk with some limitation due to his lower extremity edema.  Past Medical History:  Diagnosis Date   Arthritis    both knees and hands and left elbow, hip   Depression    treated for depression when wife was very ill   Diverticulosis    hx - no sx   Dysrhythmia    hx A fib, on anticoagulants   GI bleed 2010   GI bleed    hx   Hypertension    Recurrent upper respiratory infection (URI)    completing treatment for sinus infection - with abx   Family History  Problem Relation Age of Onset   Heart disease Mother    Heart disease Father    Past Surgical History:  Procedure Laterality Date   CARDIOVERSION  02/01/2011   Procedure: CARDIOVERSION;  Surgeon: Sinclair Grooms, MD;  Location: [place];  Service: Cardiovascular;  Laterality: N/A;   SINUS EXPLORATION  1992   laser sinus surgery   TONSILLECTOMY     age 55   TOTAL KNEE ARTHROPLASTY  10/09/2011   Procedure: TOTAL KNEE ARTHROPLASTY;  Surgeon: Hessie Dibble, MD;  Location: [place];  Service: Orthopedics;  Laterality: Left;  left total knee arthroplasty   TOTAL KNEE ARTHROPLASTY Right 10/06/2013   DR DALLDORF   TOTAL KNEE ARTHROPLASTY WITH REVISION COMPONENTS Right 10/06/2013    Procedure: TOTAL KNEE ARTHROPLASTY WITH REVISION COMPONENTS;  Surgeon: Hessie Dibble, MD;  Location: [place];  Service: Orthopedics;  Laterality: Right;   TREATMENT FISTULA ANAL  1992    Short Social History:  Social History   Tobacco Use   Smoking status: Never   Smokeless tobacco: Never  Substance Use Topics   Alcohol use: No    Allergies  Allergen Reactions   Hydrochlorothiazide     Other reaction(s): Unknown   Levaquin [Levofloxacin In D5w]     "Problems /w tendon & muscle tears"   Lisinopril Cough   Quinolones     Other reaction(s): Unknown    Current Outpatient Medications  Medication Sig Dispense Refill   allopurinol (ZYLOPRIM) 100 MG tablet Take 200 mg by mouth daily.     amoxicillin (AMOXIL) 500 MG capsule 1 capsule     ascorbic acid (VITAMIN C) 1000 MG tablet Take 1,000 mg by mouth daily.     aspirin 81 MG EC tablet Take 81 mg by mouth daily. Swallow whole.     chlorhexidine (PERIDEX) 0.12 % solution SMARTSIG:By Mouth     Coenzyme Q10 (CO Q 10) 100 MG CAPS Take 100 mg by mouth daily.     colchicine 0.6 MG tablet Take 0.6  mg by mouth 2 (two) times a week.     DHEA 50 MG TABS Take 50 mg by mouth daily.     digoxin (LANOXIN) 0.125 MG tablet TAKE 1 TABLET BY MOUTH EVERY MONDAY, WEDNESDAY, AND FRIDAY. 45 tablet 2   furosemide (LASIX) 40 MG tablet Take 3 tablets (120 mg total) by mouth daily. 90 tablet 3   Lactobacillus (ACIDOPHILUS/BIFIDUS PO) Take 100 mg by mouth daily.     Loperamide HCl (IMODIUM PO) Take 1 tablet by mouth daily as needed (loose stools).      magnesium gluconate (MAGONATE) 500 MG tablet Take 250 mg by mouth daily.     metoprolol succinate (TOPROL-XL) 50 MG 24 hr tablet TAKE 2 TABLETS BY MOUTH 2 TIMES DAILY. TAKE WITH OR IMMEDIATELY FOLLOWING A MEAL. 180 tablet 3   Misc Natural Products (TART CHERRY ADVANCED) CAPS Take 2 capsules by mouth daily.     Multiple Vitamin (MULTIVITAMIN WITH MINERALS) TABS Take 2 tablets by mouth daily.     OLIVE LEAF  EXTRACT PO Take 1 tablet by mouth daily.     olmesartan (BENICAR) 20 MG tablet Take 20 mg by mouth daily.     OVER THE COUNTER MEDICATION Take 1 capsule by mouth daily. Berry extract     OVER THE COUNTER MEDICATION Take 1 capsule by mouth daily. Vitamin d 5000 units w/ iodine 1061mcg     Pomegranate, Punica granatum, (POMEGRANATE PO) Take 250 mg by mouth daily.     Saw Palmetto, Serenoa repens, (SAW PALMETTO PO) Take 1,000 mg by mouth daily.     traMADol (ULTRAM) 50 MG tablet Take 50 mg by mouth every 4 (four) hours as needed.     triamcinolone (NASACORT) 55 MCG/ACT nasal inhaler Place 1 spray into both nostrils daily as needed (allergies).     Vit C-Cholecalciferol-Rose Hip [phone] MG-UNIT-MG CAPS Take 350 mg by mouth daily.     VITAMIN E PO Take 360 mg by mouth daily.     warfarin (COUMADIN) 5 MG tablet TAKE 1 TO 2 TABLETS BY MOUTH DAILY OR AS DIRECTED BY THE COUMADIN CLINIC. 40 tablet 2   No current facility-administered medications for this visit.    Review of Systems  Constitutional:  Constitutional negative. HENT: HENT negative.  Eyes: Eyes negative.  Respiratory: Respiratory negative.  Cardiovascular: Positive for leg swelling.  GI: Gastrointestinal negative.  Musculoskeletal: Positive for leg pain.  Skin: Positive for wound.  Neurological: Neurological negative. Hematologic: Hematologic/lymphatic negative.  Psychiatric: Psychiatric negative.       Objective:  Objective   Vitals:   12/14/20 1552  BP: 109/71  Pulse: 73  Resp: 20  Temp: 98.2 F (36.8 C)  SpO2: 97%  Weight: 293 lb 14.4 oz (133.3 kg)  Height: 5\' 11"  (1.803 m)   Body mass index is 40.99 kg/m.  Physical Exam HENT:     Head: Normocephalic.     Nose:     Comments: Wearing a mask Eyes:     Pupils: Pupils are equal, round, and reactive to light.  Cardiovascular:     Rate and Rhythm: Normal rate.     Pulses: Normal pulses.  Pulmonary:     Effort: Pulmonary effort is normal.  Abdominal:      General: Abdomen is flat.     Palpations: Abdomen is soft.  Musculoskeletal:     Cervical back: Normal range of motion.     Right lower leg: Edema present.     Left lower leg: Edema present.  Skin:    Comments: Hemosiderin deposition  Neurological:     General: No focal deficit present.     Mental Status: He is alert.  Psychiatric:        Mood and Affect: Mood normal.        Behavior: Behavior normal.        Thought Content: Thought content normal.        Judgment: Judgment normal.       Data: Previous vein mapping reviewed.  I personally evaluated his greater saphenous veins which are large below the knee on the left is in the fascia below the knee on the right is out of the fascia but enters the fascia at the level of the knee.  They measure approximately 1 cm in the mid thigh I was able to traced them both back to the saphenofemoral junction.     Assessment/Plan:     75 year old male with bilateral C5 venous disease secondary to large refluxing greater saphenous veins.  I discussed with the patient the benefit of greater saphenous vein ablation to prevent recurrent ulceration.  He remains compliant with his leg wraps and has brought them today to replace after our evaluation.  We will get him scheduled for greater saphenous vein ablation to begin with the left thigh and possible right to follow.  I discussed the risk and benefits as well as the expected postoperative outcomes and he demonstrates good understanding.    Waynetta Sandy MD [facility]

## 2020-12-22 ENCOUNTER — Other Ambulatory Visit: Payer: Self-pay

## 2020-12-22 ENCOUNTER — Ambulatory Visit (INDEPENDENT_AMBULATORY_CARE_PROVIDER_SITE_OTHER): Payer: Medicare Other | Admitting: *Deleted

## 2020-12-22 DIAGNOSIS — Z5181 Encounter for therapeutic drug level monitoring: Secondary | ICD-10-CM | POA: Diagnosis not present

## 2020-12-22 DIAGNOSIS — I4819 Other persistent atrial fibrillation: Secondary | ICD-10-CM

## 2020-12-22 LAB — POCT INR: INR: 2.5 (ref 2.0–3.0)

## 2020-12-22 NOTE — Patient Instructions (Signed)
Description   Continue taking Warfarin 1 tablet daily except for 2 tablets on Fridays. Recheck INR in 6 weeks. Call Coumadin Clinic for any questions/updates at [phone] & Fax Number for procedures [phone]

## 2021-01-10 ENCOUNTER — Other Ambulatory Visit: Payer: Self-pay | Admitting: Interventional Cardiology

## 2021-01-11 ENCOUNTER — Other Ambulatory Visit: Payer: Self-pay | Admitting: *Deleted

## 2021-01-11 DIAGNOSIS — I83813 Varicose veins of bilateral lower extremities with pain: Secondary | ICD-10-CM

## 2021-01-17 ENCOUNTER — Telehealth: Payer: Self-pay | Admitting: *Deleted

## 2021-01-17 NOTE — Telephone Encounter (Signed)
° °  Pre-operative Risk Assessment    Patient Name: Harry Foster  DOB: Feb 28, 1945 MRN: [id number]      Request for Surgical Clearance    Procedure:   02/09/21 PROCEDURE: ENDOVENOUS LASER ABLATION LEFT GREATER SAPHENOUS VEIN .  03/09/21 PROCEDURE: ENDOVENOUS LASER ABLATION RIGHT GREATER SAPHENOUS VEIN  Date of Surgery:  Clearance 02/09/21  & 03/09/21                           Surgeon:  DR. Servando Snare Surgeon's Group or Practice Name:  [facility] Phone number:  [phone] Fax number:  [phone]   Type of Clearance Requested:   - Medical  - Pharmacy:  Hold Warfarin (Coumadin) x 5 DAYS PRIOR TO OFFICE SURGERY, DAY OF AND HOLD FOR 48 HOURS S/P OFFICE SURGERY   Type of Anesthesia:  Local  & TUMESCENT ANESTHESIA     Additional requests/questions:    [name]   01/17/2021, 8:34 AM

## 2021-01-17 NOTE — Telephone Encounter (Signed)
Patient with diagnosis of Afib on Coumadin for anticoagulation.  Procedure: 02/09/21: Endovenous Laser Ablation Left Greater Saphenous Vein 03/09/21: Endovenous Laser Ablation Right Greater Saphenous Vein Date of procedure: 02/09/2021 & 03/09/2021  CHA2DS2-VASc Score = 4  This indicates a 4.8% annual risk of stroke. The patient's score is based upon: CHF History: 1 HTN History: 1 Diabetes History: 0 Stroke History: 0 Vascular Disease History: 0 Age Score: 2 Gender Score: 0  CrCl 52 mL/min (adjBW 38 mL/min) Platelet count 146k  Per office protocol, patient can hold Coumadin for 5 days prior to procedure.    Pt should restart Coumadin the evening of the procedure date, under the discretion of the procedure MD. Should not require 2 day delay to resume anticoag after since it will take 5-7 days for INR to become therapeutic post-op. Same clearance rec is appropriate for 2/9 and 3/9 procedure.

## 2021-01-17 NOTE — Telephone Encounter (Signed)
° °  Primary Cardiologist: Sinclair Grooms, MD  Chart reviewed as part of pre-operative protocol coverage. Given past medical history and time since last visit, based on ACC/AHA guidelines, Harry Foster would be at acceptable risk for the planned procedure without further cardiovascular testing.   Patient with diagnosis of Afib on Coumadin for anticoagulation.   Procedure: 02/09/21: Endovenous Laser Ablation Left Greater Saphenous Vein 03/09/21: Endovenous Laser Ablation Right Greater Saphenous Vein Date of procedure: 02/09/2021 & 03/09/2021   CHA2DS2-VASc Score = 4  This indicates a 4.8% annual risk of stroke. The patient's score is based upon: CHF History: 1 HTN History: 1 Diabetes History: 0 Stroke History: 0 Vascular Disease History: 0 Age Score: 2 Gender Score: 0   CrCl 52 mL/min (adjBW 38 mL/min) Platelet count 146k   Per office protocol, patient can hold Coumadin for 5 days prior to procedure.     Pt should restart Coumadin the evening of the procedure date, under the discretion of the procedure MD. Should not require 2 day delay to resume anticoag after since it will take 5-7 days for INR to become therapeutic post-op. Same clearance rec is appropriate for 2/9 and 3/9 procedure.   I will route this recommendation to the requesting party via Epic fax function and remove from pre-op pool.  Please call with questions.  Jossie Ng. Christoffer Currier NP-C    01/17/2021, 1:42 PM [facility] [address] Office [phone] Fax [phone]

## 2021-01-20 ENCOUNTER — Other Ambulatory Visit (HOSPITAL_COMMUNITY): Payer: Self-pay | Admitting: *Deleted

## 2021-01-20 ENCOUNTER — Telehealth: Payer: Self-pay | Admitting: Interventional Cardiology

## 2021-01-20 ENCOUNTER — Telehealth (HOSPITAL_COMMUNITY): Payer: Self-pay | Admitting: *Deleted

## 2021-01-20 ENCOUNTER — Other Ambulatory Visit: Payer: Self-pay | Admitting: *Deleted

## 2021-01-20 DIAGNOSIS — I7121 Aneurysm of the ascending aorta, without rupture: Secondary | ICD-10-CM

## 2021-01-20 NOTE — Telephone Encounter (Signed)
Patient calling in with questions/concerns for his procedure on Monday. Please advise

## 2021-01-20 NOTE — Telephone Encounter (Signed)
Reaching out to patient to let him know that we have changed his MRI order so we will need to confirm his insurance will still allow him to do the scan on Monday. Patient verbalized understanding.  Gordy Clement RN Navigator Cardiac Imaging [facility] [phone] Office [phone] Cell

## 2021-01-20 NOTE — Telephone Encounter (Signed)
Calling to tell patient that his insurance did not require an authorization and that he can come in for his chest MRA. Patient appreciative of phone call.  Gordy Clement RN Navigator Cardiac Imaging [facility] [phone] Office [phone] Cell

## 2021-01-20 NOTE — Telephone Encounter (Signed)
Spoke with pt and he wanted to know why he needed to be at his MRI an hour and a half early.  Sent secure chat to MRI scheduler and she said he needed to be there at 230pm for his 3pm MRI.  Made pt aware.  Pt appreciative for assistance.

## 2021-01-23 ENCOUNTER — Other Ambulatory Visit: Payer: Self-pay

## 2021-01-23 ENCOUNTER — Ambulatory Visit (HOSPITAL_COMMUNITY): Admission: RE | Admit: 2021-01-23 | Payer: Medicare Other | Source: Ambulatory Visit

## 2021-01-23 ENCOUNTER — Ambulatory Visit (HOSPITAL_COMMUNITY)
Admission: RE | Admit: 2021-01-23 | Discharge: 2021-01-23 | Disposition: A | Discharge status: Home / Self Care | Payer: Medicare Other | Source: Ambulatory Visit | Attending: Interventional Cardiology | Admitting: Interventional Cardiology

## 2021-01-23 DIAGNOSIS — I7121 Aneurysm of the ascending aorta, without rupture: Secondary | ICD-10-CM | POA: Insufficient documentation

## 2021-01-23 MED ORDER — GADOBUTROL 1 MMOL/ML IV SOLN
10.0000 mL | Freq: Once | INTRAVENOUS | Status: AC | PRN
  Administered 2021-01-23: 10 mL via INTRAVENOUS

## 2021-01-23 NOTE — Progress Notes (Signed)
Arrived to MRI. Notified that staff able to place the IV. Fran Lowes, RN VAST

## 2021-01-30 ENCOUNTER — Telehealth: Payer: Self-pay | Admitting: *Deleted

## 2021-01-30 NOTE — Telephone Encounter (Signed)
Pt called and cancelled appointment for 02/02/2021. Pt stated that he has been sick. Pt has procedure on 02/09/2021. Pt stated that he is to hold his warfarin for 5 days prior. ( Clearance note from 01/17/2021). Informed pt that it is okay to hold his warfarin 5 days prior and rescheduled pt to come in on 2/15 to have INR check.

## 2021-01-31 ENCOUNTER — Other Ambulatory Visit: Payer: Self-pay | Admitting: *Deleted

## 2021-01-31 MED ORDER — LORAZEPAM 1 MG PO TABS: ORAL_TABLET | ORAL | 0 refills | Status: DC

## 2021-02-09 ENCOUNTER — Ambulatory Visit: Payer: Medicare Other | Admitting: Interventional Cardiology

## 2021-02-09 ENCOUNTER — Other Ambulatory Visit: Payer: Medicare Other | Admitting: Vascular Surgery

## 2021-02-15 ENCOUNTER — Ambulatory Visit: Payer: Medicare Other

## 2021-02-15 ENCOUNTER — Other Ambulatory Visit: Payer: Self-pay

## 2021-02-15 DIAGNOSIS — I4819 Other persistent atrial fibrillation: Secondary | ICD-10-CM

## 2021-02-15 DIAGNOSIS — Z5181 Encounter for therapeutic drug level monitoring: Secondary | ICD-10-CM

## 2021-02-15 LAB — POCT INR: INR: 2.2 (ref 2.0–3.0)

## 2021-02-15 NOTE — Patient Instructions (Signed)
Description   Continue taking Warfarin 1 tablet daily except for 2 tablets on Fridays until March 4th and follow pre-procedure instructions.  Recheck INR 1 week posst procedure. Call Coumadin Clinic for any questions/updates at [phone] & Fax Number for procedures [phone]

## 2021-02-16 DIAGNOSIS — I509 Heart failure, unspecified: Secondary | ICD-10-CM | POA: Diagnosis not present

## 2021-02-16 DIAGNOSIS — N1831 Chronic kidney disease, stage 3a: Secondary | ICD-10-CM | POA: Diagnosis not present

## 2021-02-16 DIAGNOSIS — Z79899 Other long term (current) drug therapy: Secondary | ICD-10-CM | POA: Diagnosis not present

## 2021-02-16 DIAGNOSIS — M7989 Other specified soft tissue disorders: Secondary | ICD-10-CM | POA: Diagnosis not present

## 2021-02-16 DIAGNOSIS — M1A9XX1 Chronic gout, unspecified, with tophus (tophi): Secondary | ICD-10-CM | POA: Diagnosis not present

## 2021-02-16 DIAGNOSIS — I878 Other specified disorders of veins: Secondary | ICD-10-CM | POA: Diagnosis not present

## 2021-02-16 DIAGNOSIS — R600 Localized edema: Secondary | ICD-10-CM | POA: Diagnosis not present

## 2021-02-16 DIAGNOSIS — M109 Gout, unspecified: Secondary | ICD-10-CM | POA: Diagnosis not present

## 2021-02-20 ENCOUNTER — Other Ambulatory Visit: Payer: Self-pay | Admitting: Interventional Cardiology

## 2021-02-23 ENCOUNTER — Encounter (HOSPITAL_COMMUNITY): Payer: Medicare Other

## 2021-02-23 ENCOUNTER — Encounter: Payer: Medicare Other | Admitting: Vascular Surgery

## 2021-03-09 ENCOUNTER — Ambulatory Visit: Payer: Medicare Other | Admitting: Vascular Surgery

## 2021-03-09 ENCOUNTER — Other Ambulatory Visit: Payer: Self-pay

## 2021-03-09 ENCOUNTER — Encounter: Payer: Self-pay | Admitting: Vascular Surgery

## 2021-03-09 VITALS — BP 97/65 | HR 64 | Temp 98.5°F | Resp 18 | Ht 72.0 in | Wt 292.0 lb

## 2021-03-09 DIAGNOSIS — I872 Venous insufficiency (chronic) (peripheral): Secondary | ICD-10-CM | POA: Diagnosis not present

## 2021-03-09 HISTORY — PX: ENDOVENOUS ABLATION SAPHENOUS VEIN W/ LASER: SUR449

## 2021-03-09 NOTE — Progress Notes (Signed)
?  ?Patient name: Harry Foster MRN: [id number] DOB: [birth year] Sex: male ? ?REASON FOR VISIT: Left greater saphenous vein ablation ? ?HPI: ?Harry Foster is a 76 y.o. 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  He has previously been evaluated and has been compliant with compression stockings.  He now presents for left greater saphenous vein ablation and we will consider right greater saphenous vein ablation pending recovery from this initial procedure.  He has been holding Coumadin for 3 days. ? ?Current Outpatient Medications  ?Medication Sig Dispense Refill  ? allopurinol (ZYLOPRIM) 100 MG tablet Take 200 mg by mouth daily.    ? amoxicillin (AMOXIL) 500 MG capsule 1 capsule    ? ascorbic acid (VITAMIN C) 1000 MG tablet Take 1,000 mg by mouth daily.    ? aspirin 81 MG EC tablet Take 81 mg by mouth daily. Swallow whole.    ? chlorhexidine (PERIDEX) 0.12 % solution SMARTSIG:By Mouth    ? Coenzyme Q10 (CO Q 10) 100 MG CAPS Take 100 mg by mouth daily.    ? colchicine 0.6 MG tablet Take 0.6 mg by mouth 2 (two) times a week.    ? DHEA 50 MG TABS Take 50 mg by mouth daily.    ? digoxin (LANOXIN) 0.125 MG tablet TAKE 1 TABLET BY MOUTH EVERY MONDAY, WEDNESDAY, AND FRIDAY. 45 tablet 2  ? furosemide (LASIX) 40 MG tablet TAKE 3 TABLETS (120 MG TOTAL) BY MOUTH DAILY. 270 tablet 2  ? Lactobacillus (ACIDOPHILUS/BIFIDUS PO) Take 100 mg by mouth daily.    ? Loperamide HCl (IMODIUM PO) Take 1 tablet by mouth daily as needed (loose stools).     ? LORazepam (ATIVAN) 1 MG tablet Take 2 tablets 30 minutes prior to leaving house on day of office surgery.  Bring third tablet with you to office on day of office surgery. 3 tablet 0  ? magnesium gluconate (MAGONATE) 500 MG tablet Take 250 mg by mouth daily.    ? metoprolol succinate (TOPROL-XL) 50 MG 24 hr tablet TAKE 2 TABLETS BY MOUTH 2 TIMES DAILY. TAKE WITH OR IMMEDIATELY FOLLOWING A MEAL. 360 tablet 2  ? Misc Natural  Products (TART CHERRY ADVANCED) CAPS Take 2 capsules by mouth daily.    ? Multiple Vitamin (MULTIVITAMIN WITH MINERALS) TABS Take 2 tablets by mouth daily.    ? OLIVE LEAF EXTRACT PO Take 1 tablet by mouth daily.    ? olmesartan (BENICAR) 20 MG tablet Take 20 mg by mouth daily.    ? OVER THE COUNTER MEDICATION Take 1 capsule by mouth daily. Berry extract    ? OVER THE COUNTER MEDICATION Take 1 capsule by mouth daily. Vitamin d 5000 units w/ iodine 1075mg    ? Pomegranate, Punica granatum, (POMEGRANATE PO) Take 250 mg by mouth daily.    ? Saw Palmetto, Serenoa repens, (SAW PALMETTO PO) Take 1,000 mg by mouth daily.    ? traMADol (ULTRAM) 50 MG tablet Take 50 mg by mouth every 4 (four) hours as needed.    ? triamcinolone (NASACORT) 55 MCG/ACT nasal inhaler Place 1 spray into both nostrils daily as needed (allergies).    ? Vit C-Cholecalciferol-Rose Hip [phone] MG-UNIT-MG CAPS Take 350 mg by mouth daily.    ? VITAMIN E PO Take 360 mg by mouth daily.    ? warfarin (COUMADIN) 5 MG tablet TAKE 1 TO 2 TABLETS BY MOUTH DAILY OR AS DIRECTED BY THE COUMADIN CLINIC.  40 tablet 2  ? ?No current facility-administered medications for this visit.  ? ? ?PHYSICAL EXAM: ?Vitals:  ? 03/09/21 1047  ?BP: 97/65  ?Pulse: 64  ?Resp: 18  ?Temp: 98.5 ?F (36.9 ?C)  ?SpO2: 98%  ? ?Awake alert oriented ?Left greater saphenous vein marked using ultrasound from the need to the saphenofemoral junction with the patient supine on the procedure table. ? ?PROCEDURE: Left greater saphenous vein endovenous laser ablation ? ?TECHNIQUE: Patient was brought to the exam room initially placed in the supine position on the procedure table and his left greater saphenous vein was mapped with ultrasound and the saphenofemoral junction was easily identified.  The left lower extremity was then prepped and draped with a flat prep.  A timeout was called.  Ultrasound was used to identify the left greater saphenous vein at the level of the knee.  This was  cannulated with micropuncture needle just below the knee after the skin was anesthetized with 1% lidocaine.  A wire was placed followed by a micropuncture sheath.  A J-wire was placed to the level of the saphenofemoral junction and this was identified 3.1 cm from the junction.  A 45 cm sheath was then opened and placed 3 cmfrom the saphenofemoral junction and we could see the J-wire straighten. The entire segment of the left greater saphenous vein was then anesthetized with tumescent anesthesia.  The vein was then ablated for a total of 35 cm at 50 J/cm and 7 W.  At completion the common femoral vein was patent and easily compressible with ultrasound.  A compressive dressing was then placed.  He tolerated this procedure without immediate complication.  Plan will be to follow-up in 2 weeks with post ablation duplex.  He will start Coumadin the night of postoperative day 1. ? ?Servando Snare  ?[facility] ?[phone] ? ? ? ?

## 2021-03-09 NOTE — Progress Notes (Signed)
? ? ?   Laser Ablation Procedure     ?Date: 03/09/2021 ? ? ?Harry Foster DOB:03-30-1945 ? ?Consent signed: Yes     ? ?Surgeon: Servando Snare MD  ? ?Procedure: Laser Ablation: left Greater Saphenous Vein ? ?BP 97/65 (BP Location: Left Arm, Patient Position: Sitting, Cuff Size: Large)   Pulse 64   Temp 98.5 ?F (36.9 ?C) (Temporal)   Resp 18   Ht 6' (1.829 m)   Wt 292 lb (132.5 kg)   SpO2 98%   BMI 39.60 kg/m?  ? ?Tumescent Anesthesia: 425 cc 0.9% NaCl with 50 cc Lidocaine HCL 1%  and 15 cc 8.4% NaHCO3 ? ?Local Anesthesia: 13 cc Lidocaine HCL and NaHCO3 (ratio 2:1) ? ?7 watts continuous mode ?    ?Total energy: 1756 Joules     ?Total time: 250 seconds  ?Treatment Length  35 cm  ? ?Laser Fiber Ref. #  [id number]    Lot #  [id number] ? ? ? ? ?Patient tolerated procedure well ? ?Notes: Patient wore face mask.  All staff members wore facial masks and facial masks.   Harry Foster last dose of Coumadin was on 03-03-2021.  Harry Foster took Ativan 1 mg 2 tablets on 03-09-2021 at 10:00 AM.   ? ?Description of Procedure: ? ?After marking the course of the secondary varicosities, the patient was placed on the operating table in the supine position, and the left leg was prepped and draped in sterile fashion.   Local anesthetic was administered and under ultrasound guidance the saphenous vein was accessed with a micro needle and guide wire; then the mirco puncture sheath was placed.  A guide wire was inserted saphenofemoral junction , followed by a 5 french sheath.  The position of the sheath and then the laser fiber below the junction was confirmed using the ultrasound.  Tumescent anesthesia was administered along the course of the saphenous vein using ultrasound guidance. The patient was placed in Trendelenburg position and protective laser glasses were placed on patient and staff, and the laser was fired at 7 watts continuous mode for a total of 1756 joules. ? ? ? ?Steri strip was applied to the IV insertion site and ABD pads  and thigh high compression stockings were applied.  Ace wrap bandages were applied over the left thigh and at the top of the saphenofemoral junction. Blood loss was less than 15 cc.  Discharge instructions reviewed with patient and hardcopy of discharge instructions given to patient to take home. The patient ambulated out of the operating room having tolerated the procedure well.  ?

## 2021-03-10 ENCOUNTER — Telehealth: Payer: Self-pay | Admitting: Hematology

## 2021-03-10 NOTE — Telephone Encounter (Signed)
Rescheduled upcoming appointment due to provider's schedule. Mailed calendar. ?

## 2021-03-16 ENCOUNTER — Ambulatory Visit: Payer: Medicare Other

## 2021-03-16 ENCOUNTER — Other Ambulatory Visit: Payer: Self-pay

## 2021-03-16 DIAGNOSIS — I4819 Other persistent atrial fibrillation: Secondary | ICD-10-CM | POA: Diagnosis not present

## 2021-03-16 DIAGNOSIS — Z5181 Encounter for therapeutic drug level monitoring: Secondary | ICD-10-CM

## 2021-03-16 LAB — POCT INR: INR: 1.5 — AB (ref 2.0–3.0)

## 2021-03-16 NOTE — Patient Instructions (Signed)
Description   ?Take 2 tablets today and then continue taking Warfarin 1 tablet daily except for 2 tablets on Fridays  ?Recheck INR in 10 days.  ?Call Coumadin Clinic for any questions/updates at [phone] & Fax Number for procedures [phone] ?  ?   ?

## 2021-03-17 ENCOUNTER — Other Ambulatory Visit: Payer: Self-pay | Admitting: Interventional Cardiology

## 2021-03-17 DIAGNOSIS — I4819 Other persistent atrial fibrillation: Secondary | ICD-10-CM

## 2021-03-20 DIAGNOSIS — D1801 Hemangioma of skin and subcutaneous tissue: Secondary | ICD-10-CM | POA: Diagnosis not present

## 2021-03-20 DIAGNOSIS — D225 Melanocytic nevi of trunk: Secondary | ICD-10-CM | POA: Diagnosis not present

## 2021-03-20 DIAGNOSIS — D485 Neoplasm of uncertain behavior of skin: Secondary | ICD-10-CM | POA: Diagnosis not present

## 2021-03-20 DIAGNOSIS — Z85828 Personal history of other malignant neoplasm of skin: Secondary | ICD-10-CM | POA: Diagnosis not present

## 2021-03-20 DIAGNOSIS — L570 Actinic keratosis: Secondary | ICD-10-CM | POA: Diagnosis not present

## 2021-03-20 DIAGNOSIS — L814 Other melanin hyperpigmentation: Secondary | ICD-10-CM | POA: Diagnosis not present

## 2021-03-20 DIAGNOSIS — D692 Other nonthrombocytopenic purpura: Secondary | ICD-10-CM | POA: Diagnosis not present

## 2021-03-23 ENCOUNTER — Ambulatory Visit (HOSPITAL_COMMUNITY)
Admission: RE | Admit: 2021-03-23 | Discharge: 2021-03-23 | Disposition: A | Discharge status: Home / Self Care | Payer: Medicare Other | Source: Ambulatory Visit | Attending: Vascular Surgery | Admitting: Vascular Surgery

## 2021-03-23 ENCOUNTER — Other Ambulatory Visit: Payer: Self-pay | Admitting: *Deleted

## 2021-03-23 ENCOUNTER — Other Ambulatory Visit: Payer: Self-pay

## 2021-03-23 ENCOUNTER — Ambulatory Visit (INDEPENDENT_AMBULATORY_CARE_PROVIDER_SITE_OTHER): Payer: Medicare Other | Admitting: Vascular Surgery

## 2021-03-23 ENCOUNTER — Encounter: Payer: Self-pay | Admitting: Vascular Surgery

## 2021-03-23 VITALS — BP 100/65 | HR 68 | Temp 98.7°F | Resp 16 | Ht 72.0 in | Wt 290.0 lb

## 2021-03-23 DIAGNOSIS — I872 Venous insufficiency (chronic) (peripheral): Secondary | ICD-10-CM

## 2021-03-23 DIAGNOSIS — I83813 Varicose veins of bilateral lower extremities with pain: Secondary | ICD-10-CM

## 2021-03-23 NOTE — Progress Notes (Signed)
? ?Patient ID: Harry Foster, male   DOB: 1945/05/20, 76 y.o.   MRN: [id number] ? ?Reason for Consult: No chief complaint on file. ?  ?Referred by Josetta Huddle, MD ? ?Subjective:  ?   ?HPI: ? ?Harry Foster is a 76 y.o. male follows up from left greater saphenous vein ablation.  He has returned on his Coumadin on postoperative day 1 and states that his INR is now above 1.5 at last check.  He is really had no pain in the left lower extremity other than the compression stocking initially placed that he took off for 48 hours.  He states that the swelling is somewhat better on the left leg although he has had a blister from the compression stocking this is healing now that he is in his compressive wraps.  He has no complaints today. ? ?Past Medical History:  ?Diagnosis Date  ? Arthritis   ? both knees and hands and left elbow, hip  ? Depression   ? treated for depression when wife was very ill  ? Diverticulosis   ? hx - no sx  ? Dysrhythmia   ? hx A fib, on anticoagulants  ? GI bleed 2010  ? GI bleed   ? hx  ? Hypertension   ? Recurrent upper respiratory infection (URI)   ? completing treatment for sinus infection - with abx  ? ?Family History  ?Problem Relation Age of Onset  ? Heart disease Mother   ? Heart disease Father   ? ?Past Surgical History:  ?Procedure Laterality Date  ? CARDIOVERSION  02/01/2011  ? Procedure: CARDIOVERSION;  Surgeon: Sinclair Grooms, MD;  Location: [facility] OR;  Service: Cardiovascular;  Laterality: N/A;  ? ENDOVENOUS ABLATION SAPHENOUS VEIN W/ LASER Left 03/09/2021  ? endovenous laser ablation left greater saphenous vein by Servando Snare MD  ? SINUS EXPLORATION  01/01/1990  ? laser sinus surgery  ? TONSILLECTOMY    ? age 42  ? TOTAL KNEE ARTHROPLASTY  10/09/2011  ? Procedure: TOTAL KNEE ARTHROPLASTY;  Surgeon: Hessie Dibble, MD;  Location: [facility];  Service: Orthopedics;  Laterality: Left;  left total knee arthroplasty  ? TOTAL KNEE ARTHROPLASTY Right 10/06/2013  ? DR DALLDORF  ? TOTAL  KNEE ARTHROPLASTY WITH REVISION COMPONENTS Right 10/06/2013  ? Procedure: TOTAL KNEE ARTHROPLASTY WITH REVISION COMPONENTS;  Surgeon: Hessie Dibble, MD;  Location: [facility];  Service: Orthopedics;  Laterality: Right;  ? TREATMENT FISTULA ANAL  01/01/1990  ? ? ?Short Social History:  ?Social History  ? ?Tobacco Use  ? Smoking status: Never  ? Smokeless tobacco: Never  ?Substance Use Topics  ? Alcohol use: No  ? ? ?Allergies  ?Allergen Reactions  ? Hydrochlorothiazide   ?  Other reaction(s): Unknown  ? Levaquin [Levofloxacin In D5w]   ?  "Problems /w tendon & muscle tears"  ? Lisinopril Cough  ? Quinolones   ?  Other reaction(s): Unknown  ? ? ?Current Outpatient Medications  ?Medication Sig Dispense Refill  ? allopurinol (ZYLOPRIM) 100 MG tablet Take 200 mg by mouth daily.    ? amoxicillin (AMOXIL) 500 MG capsule 1 capsule    ? ascorbic acid (VITAMIN C) 1000 MG tablet Take 1,000 mg by mouth daily.    ? aspirin 81 MG EC tablet Take 81 mg by mouth daily. Swallow whole.    ? chlorhexidine (PERIDEX) 0.12 % solution SMARTSIG:By Mouth    ? Coenzyme Q10 (CO Q 10) 100 MG CAPS Take 100  mg by mouth daily.    ? colchicine 0.6 MG tablet Take 0.6 mg by mouth 2 (two) times a week.    ? DHEA 50 MG TABS Take 50 mg by mouth daily.    ? digoxin (LANOXIN) 0.125 MG tablet TAKE 1 TABLET BY MOUTH EVERY MONDAY, WEDNESDAY, AND FRIDAY. 45 tablet 1  ? furosemide (LASIX) 40 MG tablet TAKE 3 TABLETS (120 MG TOTAL) BY MOUTH DAILY. (Patient not taking: Reported on 03/09/2021) 270 tablet 2  ? Lactobacillus (ACIDOPHILUS/BIFIDUS PO) Take 100 mg by mouth daily.    ? Loperamide HCl (IMODIUM PO) Take 1 tablet by mouth daily as needed (loose stools).     ? LORazepam (ATIVAN) 1 MG tablet Take 2 tablets 30 minutes prior to leaving house on day of office surgery.  Bring third tablet with you to office on day of office surgery. 3 tablet 0  ? magnesium gluconate (MAGONATE) 500 MG tablet Take 250 mg by mouth daily.    ? metoprolol succinate (TOPROL-XL) 50 MG 24  hr tablet TAKE 2 TABLETS BY MOUTH 2 TIMES DAILY. TAKE WITH OR IMMEDIATELY FOLLOWING A MEAL. 360 tablet 2  ? Misc Natural Products (TART CHERRY ADVANCED) CAPS Take 2 capsules by mouth daily.    ? Multiple Vitamin (MULTIVITAMIN WITH MINERALS) TABS Take 2 tablets by mouth daily.    ? OLIVE LEAF EXTRACT PO Take 1 tablet by mouth daily.    ? olmesartan (BENICAR) 20 MG tablet Take 20 mg by mouth daily.    ? OVER THE COUNTER MEDICATION Take 1 capsule by mouth daily. Berry extract    ? OVER THE COUNTER MEDICATION Take 1 capsule by mouth daily. Vitamin d 5000 units w/ iodine 102mg    ? Pomegranate, Punica granatum, (POMEGRANATE PO) Take 250 mg by mouth daily.    ? Saw Palmetto, Serenoa repens, (SAW PALMETTO PO) Take 1,000 mg by mouth daily.    ? traMADol (ULTRAM) 50 MG tablet Take 50 mg by mouth every 4 (four) hours as needed.    ? triamcinolone (NASACORT) 55 MCG/ACT nasal inhaler Place 1 spray into both nostrils daily as needed (allergies).    ? Vit C-Cholecalciferol-Rose Hip [phone] MG-UNIT-MG CAPS Take 350 mg by mouth daily.    ? VITAMIN E PO Take 360 mg by mouth daily.    ? warfarin (COUMADIN) 5 MG tablet TAKE 1 TO 2 TABLETS BY MOUTH DAILY OR AS DIRECTED BY THE COUMADIN CLINIC. 40 tablet 2  ? ?No current facility-administered medications for this visit.  ? ? ?Review of Systems  ?Constitutional:  Constitutional negative. ?HENT: HENT negative.  ?Eyes: Eyes negative.  ?Cardiovascular: Positive for leg swelling.  ?GI: Gastrointestinal negative.  ?Musculoskeletal: Musculoskeletal negative.  ?Skin:  ?     Blister left leg ?Neurological: Neurological negative. ?Hematologic: Hematologic/lymphatic negative.  ?Psychiatric: Psychiatric negative.   ? ?   ?Objective:  ?Objective  ?Vitals:  ? 03/23/21 1044  ?BP: 100/65  ?Pulse: 68  ?Resp: 16  ?Temp: 98.7 ?F (37.1 ?C)  ?SpO2: 95%  ? ? ? ?Physical Exam ?HENT:  ?   Head: Normocephalic.  ?   Nose:  ?   Comments: Wearing a mask ?Eyes:  ?   Pupils: Pupils are equal, round, and  reactive to light.  ?Cardiovascular:  ?   Rate and Rhythm: Normal rate.  ?   Pulses: Normal pulses.  ?Pulmonary:  ?   Effort: Pulmonary effort is normal.  ?Abdominal:  ?   General: Abdomen is flat.  ?  Palpations: Abdomen is soft.  ?Musculoskeletal:     ?   General: Normal range of motion.  ?   Cervical back: Normal range of motion.  ?   Right lower leg: Edema present.  ?   Left lower leg: No edema.  ?   Comments: Compressive dressings in place bilateral lower extremities  ?Skin: ?   General: Skin is warm and dry.  ?   Capillary Refill: Capillary refill takes less than 2 seconds.  ?Neurological:  ?   General: No focal deficit present.  ?   Mental Status: He is alert.  ?Psychiatric:     ?   Mood and Affect: Mood normal.  ? ? ?Data: ?Left:  ?- No evidence of deep vein thrombosis seen in the left lower extremity,  ?from the common femoral through the popliteal veins.  ? ? ?-The Greater Saphenous vein is closed from the mid to distal calf to  ?within 0.60 cms from the sapheno-femoral junction.  ? ?- There is thrombus visualized in the Gastrocnemius ven approximately two  ?inches in length, proximal calf to the popliteal fossa area.  ?   ?    ?Assessment/Plan:  ?  ? ?76 year old male status post left greater saphenous vein ablation.  I reviewed the duplex today which demonstrates ablation up to 0.6 cm from the saphenofemoral junction the vein is essentially ablated all the way through to below the knee where we treated by ultrasound and there is no evidence of DVT.  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  We will get him scheduled for right greater saphenous vein ablation to treat his C5 venous disease on the right.  He continues his compressive dressings. ? ?  ? ?Waynetta Sandy MD ?[facility] ? ? ?

## 2021-03-27 ENCOUNTER — Other Ambulatory Visit: Payer: Self-pay

## 2021-03-27 ENCOUNTER — Ambulatory Visit: Payer: Medicare Other

## 2021-03-27 DIAGNOSIS — Z5181 Encounter for therapeutic drug level monitoring: Secondary | ICD-10-CM | POA: Diagnosis not present

## 2021-03-27 DIAGNOSIS — I4819 Other persistent atrial fibrillation: Secondary | ICD-10-CM

## 2021-03-27 DIAGNOSIS — N1832 Chronic kidney disease, stage 3b: Secondary | ICD-10-CM | POA: Diagnosis not present

## 2021-03-27 LAB — POCT INR: INR: 2.3 (ref 2.0–3.0)

## 2021-03-27 NOTE — Patient Instructions (Signed)
Description   ?Continue on same dosage of Warfarin 1 tablet daily except for 2 tablets on Fridays. Start holding Warfarin on 04/15/21 for upcoming procedure on 04/20/21.  Resume Warfarin after surgery per surgeon's instructions, per pt will not resume Warfarin until 4/21 per surgeon's instructions.  Will resume Warfarin at previous dosage regimen.  ?Recheck INR in 1 week after procedure.  Call Coumadin Clinic for any questions/updates at [phone] & Fax Number for procedures [phone] ?  ?  ?

## 2021-04-04 DIAGNOSIS — I129 Hypertensive chronic kidney disease with stage 1 through stage 4 chronic kidney disease, or unspecified chronic kidney disease: Secondary | ICD-10-CM | POA: Diagnosis not present

## 2021-04-04 DIAGNOSIS — M1000 Idiopathic gout, unspecified site: Secondary | ICD-10-CM | POA: Diagnosis not present

## 2021-04-04 DIAGNOSIS — D472 Monoclonal gammopathy: Secondary | ICD-10-CM | POA: Diagnosis not present

## 2021-04-04 DIAGNOSIS — N1832 Chronic kidney disease, stage 3b: Secondary | ICD-10-CM | POA: Diagnosis not present

## 2021-04-04 DIAGNOSIS — R600 Localized edema: Secondary | ICD-10-CM | POA: Diagnosis not present

## 2021-04-04 DIAGNOSIS — I5032 Chronic diastolic (congestive) heart failure: Secondary | ICD-10-CM | POA: Diagnosis not present

## 2021-04-09 NOTE — Progress Notes (Signed)
?Cardiology Office Note:   ? ?Date:  04/10/2021  ? ?ID:  Harry Foster, DOB 11/30/1945, MRN [id number] ? ?PCP:  Harry Huddle, MD  ?Cardiologist:  Sinclair Grooms, MD  ? ?Referring MD: Harry Huddle, MD  ? ?Chief Complaint  ?Patient presents with  ? Coronary Artery Disease  ? Cardiac Valve Problem  ?  TAVR  ? ? ?History of Present Illness:   ? ?Harry Foster is a 76 y.o. male with a hx of  atrial fibrillation, hypertension, chronic diastolic heart failure, and chronic anticoagulation therapy.  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 ? ? ?Because of venous insufficiency and ulcerations on his lower extremity he has seen Dr. Servando Snare and is undergone ablation of varicosities in the left leg.  The right leg is to be treated later this month.  The lower extremities are not as swollen as they have previously been.  He is happy with how things have gone so far. ? ?He is on furosemide 120 mg/day.  Creatinine is 2.6.  This is being followed by Dr. Osborne Casco his nephrologist.  Olmesartan dose has been decreased to 20 mg/day. ? ?He denies angina, orthopnea, PND, and has not needed to use nitroglycerin. ? ? ? ?Past Medical History:  ?Diagnosis Date  ? Arthritis   ? both knees and hands and left elbow, hip  ? Depression   ? treated for depression when wife was very ill  ? Diverticulosis   ? hx - no sx  ? Dysrhythmia   ? hx A fib, on anticoagulants  ? GI bleed 2010  ? GI bleed   ? hx  ? Hypertension   ? Recurrent upper respiratory infection (URI)   ? completing treatment for sinus infection - with abx  ? ? ?Past Surgical History:  ?Procedure Laterality Date  ? CARDIOVERSION  02/01/2011  ? Procedure: CARDIOVERSION;  Surgeon: Sinclair Grooms, MD;  Location: [facility] OR;  Service: Cardiovascular;  Laterality: N/A;  ? ENDOVENOUS ABLATION SAPHENOUS VEIN W/ LASER Left 03/09/2021  ? endovenous  laser ablation left greater saphenous vein by Servando Snare MD  ? SINUS EXPLORATION  01/01/1990  ? laser sinus surgery  ? TONSILLECTOMY    ? age 22  ? TOTAL KNEE ARTHROPLASTY  10/09/2011  ? Procedure: TOTAL KNEE ARTHROPLASTY;  Surgeon: Hessie Dibble, MD;  Location: [facility];  Service: Orthopedics;  Laterality: Left;  left total knee arthroplasty  ? TOTAL KNEE ARTHROPLASTY Right 10/06/2013  ? DR DALLDORF  ? TOTAL KNEE ARTHROPLASTY WITH REVISION COMPONENTS Right 10/06/2013  ? Procedure: TOTAL KNEE ARTHROPLASTY WITH REVISION COMPONENTS;  Surgeon: Hessie Dibble, MD;  Location: [facility];  Service: Orthopedics;  Laterality: Right;  ? TREATMENT FISTULA ANAL  01/01/1990  ? ? ?Current Medications: ?Current Meds  ?Medication Sig  ? allopurinol (ZYLOPRIM) 100 MG tablet Take 200 mg by mouth daily.  ? amoxicillin (AMOXIL) 500 MG capsule 1 capsule  ? ascorbic acid (VITAMIN C) 1000 MG tablet Take 1,000 mg by mouth daily.  ? aspirin 81 MG EC tablet Take 81 mg by mouth daily. Swallow whole.  ? chlorhexidine (PERIDEX) 0.12 % solution SMARTSIG:By Mouth  ? Cholecalciferol (VITAMIN D3) 125 MCG (5000 UT) CAPS Take 1 capsule by mouth daily.  ? Coenzyme Q10 (CO Q 10) 100 MG CAPS Take 100 mg by mouth daily.  ? colchicine 0.6 MG tablet  Take 0.6 mg by mouth 2 (two) times a week.  ? DHEA 50 MG TABS Take 50 mg by mouth daily.  ? digoxin (LANOXIN) 0.125 MG tablet TAKE 1 TABLET BY MOUTH EVERY MONDAY, WEDNESDAY, AND FRIDAY.  ? furosemide (LASIX) 40 MG tablet TAKE 3 TABLETS (120 MG TOTAL) BY MOUTH DAILY.  ? Lactobacillus (ACIDOPHILUS/BIFIDUS PO) Take 100 mg by mouth daily.  ? Loperamide HCl (IMODIUM PO) Take 1 tablet by mouth daily as needed (loose stools).   ? magnesium gluconate (MAGONATE) 500 MG tablet Take 250 mg by mouth daily.  ? Misc Natural Products (TART CHERRY ADVANCED) CAPS Take 2 capsules by mouth daily.  ? Multiple Vitamin (MULTIVITAMIN WITH MINERALS) TABS Take 2 tablets by mouth daily.  ? OLIVE LEAF EXTRACT PO Take 1 tablet by mouth  daily.  ? olmesartan (BENICAR) 20 MG tablet Take 20 mg by mouth daily.  ? Omega 3 1000 MG CAPS Take 2,000 mg by mouth.  ? OVER THE COUNTER MEDICATION Take 1 capsule by mouth daily. Berry extract  ? OVER THE COUNTER MEDICATION Take 1 capsule by mouth daily. Vitamin d 5000 units w/ iodine 1085mg  ? Pomegranate, Punica granatum, (POMEGRANATE PO) Take 250 mg by mouth daily.  ? Saw Palmetto, Serenoa repens, (SAW PALMETTO PO) Take 1,000 mg by mouth daily.  ? triamcinolone (NASACORT) 55 MCG/ACT nasal inhaler Place 1 spray into both nostrils daily as needed (allergies).  ? Vit C-Cholecalciferol-Rose Hip [phone] MG-UNIT-MG CAPS Take 350 mg by mouth daily.  ? VITAMIN E PO Take 360 mg by mouth daily.  ? warfarin (COUMADIN) 5 MG tablet TAKE 1 TO 2 TABLETS BY MOUTH DAILY OR AS DIRECTED BY THE COUMADIN CLINIC.  ?  ? ?Allergies:   Hydrochlorothiazide, Levaquin [levofloxacin in d5w], Lisinopril, and Quinolones  ? ?Social History  ? ?Socioeconomic History  ? Marital status: Widowed  ?  Spouse name: Not on file  ? Number of children: Not on file  ? Years of education: Not on file  ? Highest education level: Not on file  ?Occupational History  ? Not on file  ?Tobacco Use  ? Smoking status: Never  ? Smokeless tobacco: Never  ?Vaping Use  ? Vaping Use: Never used  ?Substance and Sexual Activity  ? Alcohol use: No  ? Drug use: No  ? Sexual activity: Not on file  ?Other Topics Concern  ? Not on file  ?Social History Narrative  ? Not on file  ? ?Social Determinants of Health  ? ?Financial Resource Strain: Not on file  ?Food Insecurity: Not on file  ?Transportation Needs: Not on file  ?Physical Activity: Not on file  ?Stress: Not on file  ?Social Connections: Not on file  ?  ? ?Family History: ?The patient's family history includes Heart disease in his father and mother. ? ?ROS:   ?Please see the history of present illness.    ?He is compliant with the medication regimen as prescribed.  He is still able to take care of himself  independently.  He still works in his yard.  All other systems reviewed and are negative. ? ?EKGs/Labs/Other Studies Reviewed:   ? ?The following studies were reviewed today: ? ?2 D Doppler ECHOCARDIOGRAM September 2022: ?IMPRESSIONS  ? 1. Left ventricular ejection fraction, by estimation, is 55 to 60%. The  ?left ventricle has normal function. The left ventricle has no regional  ?wall motion abnormalities. Left ventricular diastolic function could not  ?be evaluated.  ? 2. Right ventricular systolic function was not  well visualized. The right  ?ventricular size is moderately enlarged.  ? 3. The mitral valve is normal in structure. No evidence of mitral valve  ?regurgitation. No evidence of mitral stenosis.  ? 4. Tricuspid valve regurgitation is severe.  ? 5. The aortic valve is normal in structure. Aortic valve regurgitation is  ?not visualized. No aortic stenosis is present.  ? 6. Aortic dilatation noted. Aneurysm of the ascending aorta, measuring 47  ?mm. There is mild dilatation of the aortic root, measuring 41 mm.  ? ? ?MRI aorta and chest January 2023: ? ?IMPRESSION: ?Vascular: ?  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 Recommend annual imaging followup by CTA or MRA. This ?recommendation follows 2010 ?ACCF/AHA/AATS/ACR/ASA/SCA/SCAI/SIR/STS/SVM Guidelines for the ?Diagnosis and Management of Patients with Thoracic Aortic Disease. ?Circulation. 2010; 121: E831-D176. Aortic aneurysm NOS (ICD10-I71.9) ?  ?Non-Vascular: ?  ?No acute intrathoracic abnormality. ?  ?EKG:  EKG not repeated ? ?Recent Labs: ?08/08/2020: NT-Pro BNP 2,302 ?09/07/2020: ALT 15; BUN 56; Creatinine 2.69; Hemoglobin 13.4; Platelets 146; Potassium 4.1; Sodium 142  ?Recent Lipid Panel ?No results found for: CHOL, TRIG, HDL, CHOLHDL, VLDL, LDLCALC, LDLDIRECT ? ?Physical Exam:   ? ?VS:  BP (!) 91/59   Pulse 68   Ht 6' (1.829 m)   Wt 297 lb 12.8 oz (135.1 kg)   SpO2 96%   BMI 40.39 kg/m?    ? ?Wt Readings from  Last 3 Encounters:  ?04/10/21 297 lb 12.8 oz (135.1 kg)  ?03/23/21 290 lb (131.5 kg)  ?03/09/21 292 lb (132.5 kg)  ?  ? ?GEN: Obese with stable weight over the last month. No acute distress ?HEENT: Normal ?NECK: No JVD. ?L

## 2021-04-10 ENCOUNTER — Ambulatory Visit: Payer: Medicare Other | Admitting: Interventional Cardiology

## 2021-04-10 ENCOUNTER — Encounter: Payer: Self-pay | Admitting: Interventional Cardiology

## 2021-04-10 VITALS — BP 91/59 | HR 68 | Ht 72.0 in | Wt 297.8 lb

## 2021-04-10 DIAGNOSIS — N1832 Chronic kidney disease, stage 3b: Secondary | ICD-10-CM

## 2021-04-10 DIAGNOSIS — E7849 Other hyperlipidemia: Secondary | ICD-10-CM | POA: Diagnosis not present

## 2021-04-10 DIAGNOSIS — I5032 Chronic diastolic (congestive) heart failure: Secondary | ICD-10-CM

## 2021-04-10 DIAGNOSIS — I7121 Aneurysm of the ascending aorta, without rupture: Secondary | ICD-10-CM | POA: Diagnosis not present

## 2021-04-10 DIAGNOSIS — Z7901 Long term (current) use of anticoagulants: Secondary | ICD-10-CM | POA: Diagnosis not present

## 2021-04-10 DIAGNOSIS — I4819 Other persistent atrial fibrillation: Secondary | ICD-10-CM

## 2021-04-10 DIAGNOSIS — N184 Chronic kidney disease, stage 4 (severe): Secondary | ICD-10-CM | POA: Diagnosis not present

## 2021-04-10 DIAGNOSIS — N189 Chronic kidney disease, unspecified: Secondary | ICD-10-CM

## 2021-04-10 NOTE — Patient Instructions (Signed)
Medication Instructions:  ?Your physician recommends that you continue on your current medications as directed. Please refer to the Current Medication list given to you today. ? ?*If you need a refill on your cardiac medications before your next appointment, please call your pharmacy* ? ? ?Lab Work: ?None ?If you have labs (blood work) drawn today and your tests are completely normal, you will receive your results only by: ?MyChart Message (if you have MyChart) OR ?A paper copy in the mail ?If you have any lab test that is abnormal or we need to change your treatment, we will call you to review the results. ? ? ?Testing/Procedures: ?None ? ? ?Follow-Up: ?At [facility], you and your health needs are our priority.  As part of our continuing mission to provide you with exceptional heart care, we have created designated Provider Care Teams.  These Care Teams include your primary Cardiologist (physician) and Advanced Practice Providers (APPs -  Physician Assistants and Nurse Practitioners) who all work together to provide you with the care you need, when you need it. ? ?We recommend signing up for the patient portal called "MyChart".  Sign up information is provided on this After Visit Summary.  MyChart is used to connect with patients for Virtual Visits (Telemedicine).  Patients are able to view lab/test results, encounter notes, upcoming appointments, etc.  Non-urgent messages can be sent to your provider as well.   ?To learn more about what you can do with MyChart, go to [email or web address].   ? ?Your next appointment:   ?9-12 month(s) ? ?The format for your next appointment:   ?In Person ? ?Provider:   ?Sinclair Grooms, MD  ? ? ?Other Instructions ? ?Important Information About Sugar ? ? ? ? ?  ?

## 2021-04-11 ENCOUNTER — Other Ambulatory Visit: Payer: Self-pay | Admitting: *Deleted

## 2021-04-11 MED ORDER — LORAZEPAM 1 MG PO TABS
ORAL_TABLET | ORAL | 0 refills | Status: AC
Start: 2021-04-11 — End: ?

## 2021-04-20 ENCOUNTER — Encounter: Payer: Self-pay | Admitting: Vascular Surgery

## 2021-04-20 ENCOUNTER — Ambulatory Visit: Payer: Medicare Other | Admitting: Vascular Surgery

## 2021-04-20 VITALS — BP 92/53 | HR 64 | Temp 98.1°F | Resp 16 | Ht 72.0 in | Wt 294.0 lb

## 2021-04-20 DIAGNOSIS — I872 Venous insufficiency (chronic) (peripheral): Secondary | ICD-10-CM | POA: Diagnosis not present

## 2021-04-20 HISTORY — PX: ENDOVENOUS ABLATION SAPHENOUS VEIN W/ LASER: SUR449

## 2021-04-20 NOTE — Progress Notes (Signed)
? ? ?   Laser Ablation Procedure     ?Date: 04/20/2021 ? ? ?Harry Foster DOB:1945-07-13 ? ?Consent signed: Yes     ? ?Surgeon: Servando Snare MD  ? ?Procedure: Laser Ablation: right Greater Saphenous Vein ? ?BP (!) 92/53 (BP Location: Right Arm, Patient Position: Sitting, Cuff Size: Normal)   Pulse 64   Temp 98.1 ?F (36.7 ?C) (Temporal)   Resp 16   Ht 6' (1.829 m)   Wt 294 lb (133.4 kg)   SpO2 97%   BMI 39.87 kg/m?  ? ?Tumescent Anesthesia: 400 cc 0.9% NaCl with 50 cc Lidocaine HCL 1%  and 15 cc 8.4% NaHCO3 ? ?Local Anesthesia: 10 cc Lidocaine HCL and NaHCO3 (ratio 2:1) ? ?7 watts continuous mode ?    ?Total energy: 1605 Joules    ?Total time: 229 seconds  ?Treatment Length 32 cm ? ?Laser Fiber Ref. #   [id number]   Lot #  [id number] ? ? ? ? ?Patient tolerated procedure well ? ?Notes All staff members wore facial masks.  [name] took last dose of Coumadin on 04-14-2021.  [name] took Ativan 1 mg (2 tablets) on 04-20-2021 at 9:50 AM.    ? ?Description of Procedure: ? ?After marking the course of the secondary varicosities, the patient was placed on the operating table in the prone position, and the right leg was prepped and draped in sterile fashion.   Local anesthetic was administered and under ultrasound guidance the saphenous vein was accessed with a micro needle and guide wire; then the mirco puncture sheath was placed.  A guide wire was inserted saphenofemoral junction , followed by a 5 french sheath.  The position of the sheath and then the laser fiber below the junction was confirmed using the ultrasound.  Tumescent anesthesia was administered along the course of the saphenous vein using ultrasound guidance. The patient was placed in Trendelenburg position and protective laser glasses were placed on patient and staff, and the laser was fired at 7 watts continuous mode for a total of 1605 joules. ? ? ? ? ? ? ?Steri strip was applied to the IV insertion site and ABD pads and thigh high compression  stockings were applied.  Ace wrap bandages were applied over the right thigh and at the top of the saphenofemoral junction. Blood loss was less than 15 cc.  Discharge instructions reviewed with patient and hardcopy of discharge instructions given to patient to take home. The patient ambulated out of the operating room having tolerated the procedure well.  ?

## 2021-04-20 NOTE — Progress Notes (Signed)
?  ? ? ?Patient name: Harry Foster MRN: [id number] DOB: 27-Feb-1945 Sex: male ? ?REASON FOR VISIT: Here for right greater saphenous vein ablation ? ?HPI: ?Harry Foster is a 76 y.o. male with history of left greater saphenous vein ablation with successful.  Patient is on Coumadin.  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  He has taken Ativan prior to arrival and Coumadin has been held for 5 days. ? ?Current Outpatient Medications  ?Medication Sig Dispense Refill  ? allopurinol (ZYLOPRIM) 100 MG tablet Take 200 mg by mouth daily.    ? amoxicillin (AMOXIL) 500 MG capsule 1 capsule    ? ascorbic acid (VITAMIN C) 1000 MG tablet Take 1,000 mg by mouth daily.    ? aspirin 81 MG EC tablet Take 81 mg by mouth daily. Swallow whole.    ? chlorhexidine (PERIDEX) 0.12 % solution SMARTSIG:By Mouth    ? Cholecalciferol (VITAMIN D3) 125 MCG (5000 UT) CAPS Take 1 capsule by mouth daily.    ? Coenzyme Q10 (CO Q 10) 100 MG CAPS Take 100 mg by mouth daily.    ? colchicine 0.6 MG tablet Take 0.6 mg by mouth 2 (two) times a week.    ? DHEA 50 MG TABS Take 50 mg by mouth daily.    ? digoxin (LANOXIN) 0.125 MG tablet TAKE 1 TABLET BY MOUTH EVERY MONDAY, WEDNESDAY, AND FRIDAY. 45 tablet 1  ? furosemide (LASIX) 40 MG tablet TAKE 3 TABLETS (120 MG TOTAL) BY MOUTH DAILY. 270 tablet 2  ? Lactobacillus (ACIDOPHILUS/BIFIDUS PO) Take 100 mg by mouth daily.    ? Loperamide HCl (IMODIUM PO) Take 1 tablet by mouth daily as needed (loose stools).     ? LORazepam (ATIVAN) 1 MG tablet Take 2 tablets 30 minutes prior to leaving house on day of office surgery. Bring third tablet with you to office on day of office surgery. 3 tablet 0  ? magnesium gluconate (MAGONATE) 500 MG tablet Take 250 mg by mouth daily.    ? Misc Natural Products (TART CHERRY ADVANCED) CAPS Take 2 capsules by mouth daily.    ? Multiple Vitamin (MULTIVITAMIN WITH MINERALS) TABS Take 2 tablets by mouth daily.     ? OLIVE LEAF EXTRACT PO Take 1 tablet by mouth daily.    ? olmesartan (BENICAR) 20 MG tablet Take 20 mg by mouth daily.    ? Omega 3 1000 MG CAPS Take 2,000 mg by mouth.    ? OVER THE COUNTER MEDICATION Take 1 capsule by mouth daily. Berry extract    ? OVER THE COUNTER MEDICATION Take 1 capsule by mouth daily. Vitamin d 5000 units w/ iodine 1037mg    ? Pomegranate, Punica granatum, (POMEGRANATE PO) Take 250 mg by mouth daily.    ? Saw Palmetto, Serenoa repens, (SAW PALMETTO PO) Take 1,000 mg by mouth daily.    ? triamcinolone (NASACORT) 55 MCG/ACT nasal inhaler Place 1 spray into both nostrils daily as needed (allergies).    ? Vit C-Cholecalciferol-Rose Hip [phone] MG-UNIT-MG CAPS Take 350 mg by mouth daily.    ? VITAMIN E PO Take 360 mg by mouth daily.    ? warfarin (COUMADIN) 5 MG tablet TAKE 1 TO 2 TABLETS BY MOUTH DAILY OR AS DIRECTED BY THE COUMADIN CLINIC. 40 tablet 2  ? ?No current facility-administered medications for this visit.  ? ? ?PHYSICAL EXAM: ?Vitals:  ? 04/20/21 1141  ?BP: (Marland Kitchen  92/53  ?Pulse: 64  ?Resp: 16  ?Temp: 98.1 ?F (36.7 ?C)  ?SpO2: 97%  ? ? ?Awake alert oriented ?right greater saphenous vein marked using ultrasound from the knee to the saphenofemoral junction with the patient supine on the procedure table. ?  ?PROCEDURE: Right greater saphenous vein endovenous laser ablation ?  ?TECHNIQUE: Patient was brought to the exam room initially placed in the supine position on the procedure table and his right greater saphenous vein was mapped with ultrasound and the saphenofemoral junction was easily identified.  The right lower extremity was then prepped and draped with a flat prep.  A timeout was called.  Ultrasound was used to identify the right greater saphenous vein at the level of the knee.  This was cannulated with micropuncture needle at the level of the knee after the skin was anesthetized with 1% lidocaine.  A wire was placed followed by a micropuncture sheath.  A J-wire was placed to  the level of the right saphenofemoral junction and this was identified 3 cm from the junction.  A 45 cm sheath was then opened and placed 3 cm cm from the saphenofemoral junction and we could see the J-wire straighten. The entire segment of the right greater saphenous vein was then anesthetized with tumescent anesthesia.  The vein was then ablated for a total of 32 cm at 50 J/cm and 7 W.  At completion the right common femoral vein was patent and easily compressible with ultrasound.  A compressive dressing was then placed.  He tolerated this procedure without immediate complication.  Plan will be to follow-up in 2 weeks with post ablation duplex.  He will start Coumadin tomorrow. ?  ?Servando Snare  ?[facility] ?[phone] ?  ? ?Servando Snare ?[facility] ?[phone] ? ? ? ?

## 2021-04-26 ENCOUNTER — Encounter: Payer: Self-pay | Admitting: Vascular Surgery

## 2021-04-26 ENCOUNTER — Ambulatory Visit (INDEPENDENT_AMBULATORY_CARE_PROVIDER_SITE_OTHER): Payer: Medicare Other | Admitting: Vascular Surgery

## 2021-04-26 ENCOUNTER — Ambulatory Visit (HOSPITAL_COMMUNITY)
Admission: RE | Admit: 2021-04-26 | Discharge: 2021-04-26 | Disposition: A | Discharge status: Home / Self Care | Payer: Medicare Other | Source: Ambulatory Visit | Attending: Vascular Surgery | Admitting: Vascular Surgery

## 2021-04-26 VITALS — BP 91/63 | HR 70 | Temp 98.4°F | Resp 20 | Ht 72.0 in | Wt 291.0 lb

## 2021-04-26 DIAGNOSIS — I83813 Varicose veins of bilateral lower extremities with pain: Secondary | ICD-10-CM | POA: Diagnosis not present

## 2021-04-26 DIAGNOSIS — I872 Venous insufficiency (chronic) (peripheral): Secondary | ICD-10-CM | POA: Diagnosis not present

## 2021-04-26 NOTE — Progress Notes (Signed)
? ?Patient ID: Harry Foster, male   DOB: [birth year], 76 y.o.   MRN: [id number] ? ?Reason for Consult: Routine Post Op ?  ?Referred by Josetta Huddle, MD ? ?Subjective:  ?   ?HPI: ? ?Harry Foster is a 76 y.o. male follows up after right greater saphenous vein ablation.  He had previously undergone left greater saphenous vein ablation.  He states that he has had some swelling in the right thigh but that overall he has recovered well and has been compliant with thigh-high compression stockings.  He is back to using knee-high wraps on the left leg supple to return using knee-high wraps on the right lower extremity.  Overall he is recovering well does not have any specific complaints today. ? ?Past Medical History:  ?Diagnosis Date  ? Arthritis   ? both knees and hands and left elbow, hip  ? Depression   ? treated for depression when wife was very ill  ? Diverticulosis   ? hx - no sx  ? Dysrhythmia   ? hx A fib, on anticoagulants  ? GI bleed 2010  ? GI bleed   ? hx  ? Hypertension   ? Recurrent upper respiratory infection (URI)   ? completing treatment for sinus infection - with abx  ? ?Family History  ?Problem Relation Age of Onset  ? Heart disease Mother   ? Heart disease Father   ? ?Past Surgical History:  ?Procedure Laterality Date  ? CARDIOVERSION  02/01/2011  ? Procedure: CARDIOVERSION;  Surgeon: Sinclair Grooms, MD;  Location: [facility] OR;  Service: Cardiovascular;  Laterality: N/A;  ? ENDOVENOUS ABLATION SAPHENOUS VEIN W/ LASER Left 03/09/2021  ? endovenous laser ablation left greater saphenous vein by Servando Snare MD  ? ENDOVENOUS ABLATION SAPHENOUS VEIN W/ LASER Right 04/20/2021  ? endovenous laser ablation right greater saphenous vein By Servando Snare MD  ? SINUS EXPLORATION  01/01/1990  ? laser sinus surgery  ? TONSILLECTOMY    ? age 16  ? TOTAL KNEE ARTHROPLASTY  10/09/2011  ? Procedure: TOTAL KNEE ARTHROPLASTY;  Surgeon: Hessie Dibble, MD;  Location: [place];  Service: Orthopedics;  Laterality: Left;   left total knee arthroplasty  ? TOTAL KNEE ARTHROPLASTY Right 10/06/2013  ? DR DALLDORF  ? TOTAL KNEE ARTHROPLASTY WITH REVISION COMPONENTS Right 10/06/2013  ? Procedure: TOTAL KNEE ARTHROPLASTY WITH REVISION COMPONENTS;  Surgeon: Hessie Dibble, MD;  Location: [place];  Service: Orthopedics;  Laterality: Right;  ? TREATMENT FISTULA ANAL  01/01/1990  ? ? ?Short Social History:  ?Social History  ? ?Tobacco Use  ? Smoking status: Never  ? Smokeless tobacco: Never  ?Substance Use Topics  ? Alcohol use: No  ? ? ?Allergies  ?Allergen Reactions  ? Hydrochlorothiazide   ?  Other reaction(s): Unknown  ? Levaquin [Levofloxacin In D5w]   ?  "Problems /w tendon & muscle tears"  ? Lisinopril Cough  ? Quinolones   ?  Other reaction(s): Unknown  ? ? ?Current Outpatient Medications  ?Medication Sig Dispense Refill  ? allopurinol (ZYLOPRIM) 100 MG tablet Take 200 mg by mouth daily.    ? ascorbic acid (VITAMIN C) 1000 MG tablet Take 1,000 mg by mouth daily.    ? aspirin 81 MG EC tablet Take 81 mg by mouth daily. Swallow whole.    ? chlorhexidine (PERIDEX) 0.12 % solution SMARTSIG:By Mouth    ? Cholecalciferol (VITAMIN D3) 125 MCG (5000 UT) CAPS Take 1 capsule by mouth daily.    ?  Coenzyme Q10 (CO Q 10) 100 MG CAPS Take 100 mg by mouth daily.    ? colchicine 0.6 MG tablet Take 0.6 mg by mouth 2 (two) times a week.    ? DHEA 50 MG TABS Take 50 mg by mouth daily.    ? digoxin (LANOXIN) 0.125 MG tablet TAKE 1 TABLET BY MOUTH EVERY MONDAY, WEDNESDAY, AND FRIDAY. 45 tablet 1  ? furosemide (LASIX) 40 MG tablet TAKE 3 TABLETS (120 MG TOTAL) BY MOUTH DAILY. 270 tablet 2  ? Lactobacillus (ACIDOPHILUS/BIFIDUS PO) Take 100 mg by mouth daily.    ? Loperamide HCl (IMODIUM PO) Take 1 tablet by mouth daily as needed (loose stools).     ? LORazepam (ATIVAN) 1 MG tablet Take 2 tablets 30 minutes prior to leaving house on day of office surgery. Bring third tablet with you to office on day of office surgery. 3 tablet 0  ? magnesium gluconate (MAGONATE)  500 MG tablet Take 250 mg by mouth daily.    ? Misc Natural Products (TART CHERRY ADVANCED) CAPS Take 2 capsules by mouth daily.    ? Multiple Vitamin (MULTIVITAMIN WITH MINERALS) TABS Take 2 tablets by mouth daily.    ? OLIVE LEAF EXTRACT PO Take 1 tablet by mouth daily.    ? olmesartan (BENICAR) 20 MG tablet Take 20 mg by mouth daily.    ? Omega 3 1000 MG CAPS Take 2,000 mg by mouth.    ? OVER THE COUNTER MEDICATION Take 1 capsule by mouth daily. Berry extract    ? OVER THE COUNTER MEDICATION Take 1 capsule by mouth daily. Vitamin d 5000 units w/ iodine 1062mg    ? Pomegranate, Punica granatum, (POMEGRANATE PO) Take 250 mg by mouth daily.    ? Saw Palmetto, Serenoa repens, (SAW PALMETTO PO) Take 1,000 mg by mouth daily.    ? triamcinolone (NASACORT) 55 MCG/ACT nasal inhaler Place 1 spray into both nostrils daily as needed (allergies).    ? Vit C-Cholecalciferol-Rose Hip [phone] MG-UNIT-MG CAPS Take 350 mg by mouth daily.    ? VITAMIN E PO Take 360 mg by mouth daily.    ? warfarin (COUMADIN) 5 MG tablet TAKE 1 TO 2 TABLETS BY MOUTH DAILY OR AS DIRECTED BY THE COUMADIN CLINIC. 40 tablet 2  ? amoxicillin (AMOXIL) 500 MG capsule 1 capsule (Patient not taking: Reported on 04/20/2021)    ? ?No current facility-administered medications for this visit.  ? ? ?Review of Systems  ?Constitutional:  Constitutional negative. ?HENT: HENT negative.  ?Eyes: Eyes negative.  ?Respiratory: Respiratory negative.  ?Cardiovascular: Positive for leg swelling.  ?GI: Gastrointestinal negative.  ?Musculoskeletal: Musculoskeletal negative.  ?Skin:  ?     Dark discoloration bilateral legs below the knee ?Neurological: Neurological negative. ?Hematologic: Hematologic/lymphatic negative.  ?Psychiatric: Psychiatric negative.   ? ?   ?Objective:  ?Objective  ? ?Vitals:  ? 04/26/21 1042  ?BP: 91/63  ?Pulse: 70  ?Resp: 20  ?Temp: 98.4 ?F (36.9 ?C)  ?SpO2: 97%  ?Weight: 291 lb (132 kg)  ?Height: 6' (1.829 m)  ? ?Body mass index is 39.47  kg/m?. ? ?Physical Exam ?HENT:  ?   Head: Normocephalic.  ?   Nose: Nose normal.  ?Eyes:  ?   Pupils: Pupils are equal, round, and reactive to light.  ?Cardiovascular:  ?   Pulses: Normal pulses.  ?Pulmonary:  ?   Effort: Pulmonary effort is normal.  ?Abdominal:  ?   General: Abdomen is flat.  ?   Palpations: Abdomen  is soft.  ?Musculoskeletal:  ?   Right lower leg: Edema present.  ?   Left lower leg: Edema present.  ?   Comments: Palpable cord in the right medial thigh  ?Skin: ?   General: Skin is warm.  ?   Capillary Refill: Capillary refill takes less than 2 seconds.  ?   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   General: No focal deficit present.  ?   Mental Status: He is alert.  ?Psychiatric:     ?   Mood and Affect: Mood normal.     ?   Behavior: Behavior normal.     ?   Thought Content: Thought content normal.  ? ? ?Data: ?Venous Reflux Times  ?+---------+---------+------+-----------+------------+--------+  ?RIGHT    Reflux NoRefluxReflux TimeDiameter cmsComments  ?                   Yes                                   ?+---------+---------+------+-----------+------------+--------+  ?CFV                                            patent    ?+---------+---------+------+-----------+------------+--------+  ?FV mid                                         patent    ?+---------+---------+------+-----------+------------+--------+  ?Popliteal                                      patent    ?+---------+---------+------+-----------+------------+--------+  ? ?   ? ?   ? ?Summary:  ?Right:  ?- No evidence of deep vein thrombosis from the common femoral through the  ?popliteal veins.  ?- Successful ablation from the proximal calf up to 1.20 cm from the  ?saphenofemoral junction.  ?- Small saphenous vein reflux observed.  ?    ?Assessment/Plan:  ?  ?76 year old male status post right greater saphenous vein ablation.  He has  previously had the left side ablated.  He will continue compressive stockings and can follow-up with me on an as-needed basis. ?  ? ?Waynetta Sandy MD ?[facility] ? ? ?

## 2021-04-28 ENCOUNTER — Ambulatory Visit: Payer: Medicare Other

## 2021-04-28 DIAGNOSIS — Z5181 Encounter for therapeutic drug level monitoring: Secondary | ICD-10-CM | POA: Diagnosis not present

## 2021-04-28 DIAGNOSIS — I4819 Other persistent atrial fibrillation: Secondary | ICD-10-CM

## 2021-04-28 LAB — POCT INR: INR: 1.6 — AB (ref 2.0–3.0)

## 2021-04-28 NOTE — Patient Instructions (Signed)
Description   ?Take 3 tablets today and 1.5 tablets tomorrow and then continue taking Warfarin 1 tablet daily except for 2 tablets on Fridays.  ?Recheck INR in 10 days.  ?Call Coumadin Clinic for any questions/updates at [phone] & Fax Number for procedures [phone] ?  ?   ?

## 2021-05-08 ENCOUNTER — Ambulatory Visit: Payer: Medicare Other | Admitting: *Deleted

## 2021-05-08 DIAGNOSIS — I4819 Other persistent atrial fibrillation: Secondary | ICD-10-CM | POA: Diagnosis not present

## 2021-05-08 DIAGNOSIS — Z5181 Encounter for therapeutic drug level monitoring: Secondary | ICD-10-CM

## 2021-05-08 LAB — POCT INR: INR: 1.9 — AB (ref 2.0–3.0)

## 2021-05-08 NOTE — Patient Instructions (Signed)
Description   ?Today take 2 tablets then continue taking Warfarin 1 tablet daily except for 2 tablets on Fridays. Recheck INR in 2 weeks. Call Coumadin Clinic for any questions/updates at [phone] & Fax Number for procedures [phone] ?  ?  ?

## 2021-05-25 ENCOUNTER — Ambulatory Visit: Payer: Medicare Other | Admitting: *Deleted

## 2021-05-25 DIAGNOSIS — I4819 Other persistent atrial fibrillation: Secondary | ICD-10-CM | POA: Diagnosis not present

## 2021-05-25 DIAGNOSIS — Z5181 Encounter for therapeutic drug level monitoring: Secondary | ICD-10-CM | POA: Diagnosis not present

## 2021-05-25 LAB — POCT INR: INR: 1.9 — AB (ref 2.0–3.0)

## 2021-05-25 NOTE — Patient Instructions (Signed)
Description   Today take 2 tablets then start taking Warfarin 1 tablet daily except for 2 tablets on Mondays and Fridays. Recheck INR in 4 weeks. Call Coumadin Clinic for any questions/updates at [phone] & Fax Number for procedures [phone]

## 2021-06-14 DIAGNOSIS — Z0000 Encounter for general adult medical examination without abnormal findings: Secondary | ICD-10-CM | POA: Diagnosis not present

## 2021-06-14 DIAGNOSIS — D6869 Other thrombophilia: Secondary | ICD-10-CM | POA: Diagnosis not present

## 2021-06-14 DIAGNOSIS — N184 Chronic kidney disease, stage 4 (severe): Secondary | ICD-10-CM | POA: Diagnosis not present

## 2021-06-14 DIAGNOSIS — I129 Hypertensive chronic kidney disease with stage 1 through stage 4 chronic kidney disease, or unspecified chronic kidney disease: Secondary | ICD-10-CM | POA: Diagnosis not present

## 2021-06-14 DIAGNOSIS — I4891 Unspecified atrial fibrillation: Secondary | ICD-10-CM | POA: Diagnosis not present

## 2021-06-14 DIAGNOSIS — I83019 Varicose veins of right lower extremity with ulcer of unspecified site: Secondary | ICD-10-CM | POA: Diagnosis not present

## 2021-06-14 DIAGNOSIS — M1A9XX1 Chronic gout, unspecified, with tophus (tophi): Secondary | ICD-10-CM | POA: Diagnosis not present

## 2021-06-14 DIAGNOSIS — M170 Bilateral primary osteoarthritis of knee: Secondary | ICD-10-CM | POA: Diagnosis not present

## 2021-06-14 DIAGNOSIS — G4710 Hypersomnia, unspecified: Secondary | ICD-10-CM | POA: Diagnosis not present

## 2021-06-14 DIAGNOSIS — I10 Essential (primary) hypertension: Secondary | ICD-10-CM | POA: Diagnosis not present

## 2021-06-14 DIAGNOSIS — J309 Allergic rhinitis, unspecified: Secondary | ICD-10-CM | POA: Diagnosis not present

## 2021-06-14 DIAGNOSIS — I5020 Unspecified systolic (congestive) heart failure: Secondary | ICD-10-CM | POA: Diagnosis not present

## 2021-06-19 DIAGNOSIS — R600 Localized edema: Secondary | ICD-10-CM | POA: Diagnosis not present

## 2021-06-19 DIAGNOSIS — M7989 Other specified soft tissue disorders: Secondary | ICD-10-CM | POA: Diagnosis not present

## 2021-06-19 DIAGNOSIS — I878 Other specified disorders of veins: Secondary | ICD-10-CM | POA: Diagnosis not present

## 2021-06-19 DIAGNOSIS — M1A9XX1 Chronic gout, unspecified, with tophus (tophi): Secondary | ICD-10-CM | POA: Diagnosis not present

## 2021-06-19 DIAGNOSIS — Z79899 Other long term (current) drug therapy: Secondary | ICD-10-CM | POA: Diagnosis not present

## 2021-06-19 DIAGNOSIS — N1831 Chronic kidney disease, stage 3a: Secondary | ICD-10-CM | POA: Diagnosis not present

## 2021-06-19 DIAGNOSIS — M109 Gout, unspecified: Secondary | ICD-10-CM | POA: Diagnosis not present

## 2021-06-19 DIAGNOSIS — I509 Heart failure, unspecified: Secondary | ICD-10-CM | POA: Diagnosis not present

## 2021-06-20 ENCOUNTER — Other Ambulatory Visit: Payer: Self-pay | Admitting: Interventional Cardiology

## 2021-06-20 DIAGNOSIS — I4819 Other persistent atrial fibrillation: Secondary | ICD-10-CM

## 2021-06-22 ENCOUNTER — Ambulatory Visit: Payer: Medicare Other | Admitting: *Deleted

## 2021-06-22 DIAGNOSIS — I4819 Other persistent atrial fibrillation: Secondary | ICD-10-CM

## 2021-06-22 DIAGNOSIS — Z5181 Encounter for therapeutic drug level monitoring: Secondary | ICD-10-CM

## 2021-06-22 LAB — POCT INR: INR: 3.5 — AB (ref 2.0–3.0)

## 2021-06-22 NOTE — Patient Instructions (Signed)
Description   Do not take any Warfarin today then start taking Warfarin 1 tablet daily except for 2 tablets on Fridays. Recheck INR in 4 weeks. Call Coumadin Clinic for any questions/updates at [phone] & Fax Number for procedures [phone]

## 2021-06-28 ENCOUNTER — Telehealth: Payer: Self-pay | Admitting: Pharmacist

## 2021-06-28 DIAGNOSIS — I4819 Other persistent atrial fibrillation: Secondary | ICD-10-CM

## 2021-06-28 NOTE — Telephone Encounter (Signed)
Received message to call patient to discuss possibly changing from warfarin to [place].  LMOM

## 2021-06-29 NOTE — Telephone Encounter (Signed)
Pt called and stated he was returning a call to Gerald Stabs advised that Gerald Stabs was unavailable and with a patient. I asked if I could take a message and he stated that he had been trying to reach him about eliquis. He stated that he don't like playing phone tag and he cannot keep sitting around cause he has work to do.  Advised that Gerald Stabs our pharmacist has been seeing pts and I could let him know to call the pt back. He stated he would be around the house until 5pm but after 5pm he would be on his tractor cause he has work to do. Advised that if he has a better time for Gerald Stabs to call to let me know and I could update Gerald Stabs and the pt stated he called me after 5pm, 513pm to be exact, and he did not expect a call that late. Advised that Gerald Stabs works later and works to call pts back during his availability between and after pts. He stated "if I don't hear from [facility] by 5pm then I will just march up to [facility] and go to [employer]". Then he stated that he will be at the house until [place] I would update Gerald Stabs in between the appointments he has.

## 2021-06-30 MED ORDER — APIXABAN 5 MG PO TABS: 5.0000 mg | ORAL_TABLET | Freq: Two times a day (BID) | ORAL | 1 refills | Status: DC

## 2021-06-30 NOTE — Addendum Note (Signed)
Addended by: Rollen Sox on: 06/30/2021 10:14 AM   Modules accepted: Orders

## 2021-06-30 NOTE — Telephone Encounter (Signed)
Patient called back and would like to start Eliquis. Upset that he had not heard back yesterday. Explained that I make phone calls in between patients and after scheduled appointments. Patient not pleased with this response.  Regardless, educated patient on dosing and possible adverse effects. Advised to hold warfarin for 2 days and then start Eliquis '5mg'$  BID.  Patient voiced understanding.

## 2021-07-14 ENCOUNTER — Telehealth: Payer: Self-pay | Admitting: Hematology

## 2021-07-14 NOTE — Telephone Encounter (Signed)
Rescheduled upcoming appointment due to provider's template. Patient is aware of changes. 

## 2021-09-06 ENCOUNTER — Other Ambulatory Visit: Payer: Self-pay

## 2021-09-06 DIAGNOSIS — D472 Monoclonal gammopathy: Secondary | ICD-10-CM

## 2021-09-07 ENCOUNTER — Inpatient Hospital Stay: Payer: Medicare Other | Attending: Hematology

## 2021-09-07 ENCOUNTER — Other Ambulatory Visit: Payer: Self-pay

## 2021-09-07 DIAGNOSIS — N1832 Chronic kidney disease, stage 3b: Secondary | ICD-10-CM | POA: Diagnosis not present

## 2021-09-07 DIAGNOSIS — D472 Monoclonal gammopathy: Secondary | ICD-10-CM | POA: Diagnosis not present

## 2021-09-07 LAB — CBC WITH DIFFERENTIAL (CANCER CENTER ONLY)
Abs Immature Granulocytes: 0.02 10*3/uL (ref 0.00–0.07)
Basophils Absolute: 0 10*3/uL (ref 0.0–0.1)
Basophils Relative: 1 %
Eosinophils Absolute: 0.1 10*3/uL (ref 0.0–0.5)
Eosinophils Relative: 2 %
HCT: 42.4 % (ref 39.0–52.0)
Hemoglobin: 14.4 g/dL (ref 13.0–17.0)
Immature Granulocytes: 0 %
Lymphocytes Relative: 22 %
Lymphs Abs: 1.1 10*3/uL (ref 0.7–4.0)
MCH: 33.3 pg (ref 26.0–34.0)
MCHC: 34 g/dL (ref 30.0–36.0)
MCV: 97.9 fL (ref 80.0–100.0)
Monocytes Absolute: 0.4 10*3/uL (ref 0.1–1.0)
Monocytes Relative: 8 %
Neutro Abs: 3.5 10*3/uL (ref 1.7–7.7)
Neutrophils Relative %: 67 %
Platelet Count: 145 10*3/uL — ABNORMAL LOW (ref 150–400)
RBC: 4.33 MIL/uL (ref 4.22–5.81)
RDW: 14.1 % (ref 11.5–15.5)
WBC Count: 5.2 10*3/uL (ref 4.0–10.5)
nRBC: 0 % (ref 0.0–0.2)

## 2021-09-07 LAB — CMP (CANCER CENTER ONLY)
ALT: 16 U/L (ref 0–44)
AST: 18 U/L (ref 15–41)
Albumin: 3.8 g/dL (ref 3.5–5.0)
Alkaline Phosphatase: 68 U/L (ref 38–126)
Anion gap: 4 — ABNORMAL LOW (ref 5–15)
BUN: 45 mg/dL — ABNORMAL HIGH (ref 8–23)
CO2: 29 mmol/L (ref 22–32)
Calcium: 9.8 mg/dL (ref 8.9–10.3)
Chloride: 108 mmol/L (ref 98–111)
Creatinine: 2.31 mg/dL — ABNORMAL HIGH (ref 0.61–1.24)
GFR, Estimated: 29 mL/min — ABNORMAL LOW (ref >60)
Glucose, Bld: 129 mg/dL — ABNORMAL HIGH (ref 70–99)
Potassium: 4.4 mmol/L (ref 3.5–5.1)
Sodium: 141 mmol/L (ref 135–145)
Total Bilirubin: 0.6 mg/dL (ref 0.3–1.2)
Total Protein: 6.3 g/dL — ABNORMAL LOW (ref 6.5–8.1)

## 2021-09-14 ENCOUNTER — Other Ambulatory Visit: Payer: Medicare Other

## 2021-09-14 ENCOUNTER — Ambulatory Visit: Payer: Medicare Other | Admitting: Hematology

## 2021-09-14 LAB — MULTIPLE MYELOMA PANEL, SERUM
Albumin SerPl Elph-Mcnc: 3.4 g/dL (ref 2.9–4.4)
Albumin/Glob SerPl: 1.5 (ref 0.7–1.7)
Alpha 1: 0.2 g/dL (ref 0.0–0.4)
Alpha2 Glob SerPl Elph-Mcnc: 0.5 g/dL (ref 0.4–1.0)
B-Globulin SerPl Elph-Mcnc: 0.7 g/dL (ref 0.7–1.3)
Gamma Glob SerPl Elph-Mcnc: 1 g/dL (ref 0.4–1.8)
Globulin, Total: 2.4 g/dL (ref 2.2–3.9)
IgA: 48 mg/dL — ABNORMAL LOW (ref 61–437)
IgG (Immunoglobin G), Serum: 1102 mg/dL (ref 603–1613)
IgM (Immunoglobulin M), Srm: 30 mg/dL (ref 15–143)
M Protein SerPl Elph-Mcnc: 0.7 g/dL — ABNORMAL HIGH
Total Protein ELP: 5.8 g/dL — ABNORMAL LOW (ref 6.0–8.5)

## 2021-09-15 ENCOUNTER — Other Ambulatory Visit: Payer: Self-pay

## 2021-09-15 ENCOUNTER — Inpatient Hospital Stay: Payer: Medicare Other | Admitting: Hematology

## 2021-09-15 VITALS — BP 108/75 | HR 68 | Temp 98.4°F | Resp 15 | Wt 303.5 lb

## 2021-09-15 DIAGNOSIS — N1832 Chronic kidney disease, stage 3b: Secondary | ICD-10-CM | POA: Diagnosis not present

## 2021-09-15 DIAGNOSIS — D472 Monoclonal gammopathy: Secondary | ICD-10-CM

## 2021-09-22 NOTE — Progress Notes (Addendum)
HEMATOLOGY/ONCOLOGY CLINIC NOTE  Date of Service: .09/15/2021   Patient Care Team: Josetta Huddle, MD as PCP - General (Internal Medicine) Belva Crome, MD as PCP - Cardiology (Cardiology)  CHIEF COMPLAINTS/PURPOSE OF CONSULTATION:  Low for monoclonal paraproteinemia  HISTORY OF PRESENTING ILLNESS:   Harry Foster is a wonderful 76 y.o. male who has been referred to [place] by Dr. Joylene Grapes for evaluation and management of M Spike. The pt reports that he is doing well overall.   The pt reports that he has Afib, which is being managed with Warfarin by Dr. Daneen Schick. His HTN has been under control. Pt is being followed by Dr. Kathlene November for Gout which is not well-controlled. Pt reports finger stiffness and mobility challenges in the last 15 months. Pt has been avoiding sugary liquids and red meats. His uric acid is currently greater than 11. He is considering starting Pegloticase to control his Gout. Pt has been dealing with renal dysfunction for the last 10 years.   Most recent lab results (09/23/2019) of CBC w/diff & CMP is as follows: all values are WNL except for BUN at 48, Creatinine at 2.40, GFR Est Non Af Am at 26. 09/23/2019 SPEP shows all values are WNL except for M Spike at 0.8 09/23/2019 Kappa light chains at 34.4, Lambda light chans at 42.8, K/L light chain ratio at 0.8  On review of systems, pt reports joint pain/stiffness and denies new bone pain, abdominal pain, constipation, dysuria and any other symptoms.   On PMHx the pt reports Arthritis, Atrial fibrillation, HTN, Gout, Cardioversion, Stage 3b CKD.  On Family Hx the pt reports that his mother had breast and cervical cancer in her 73's and his sister had breast cancer in her 39-50's.   INTERVAL HISTORY:  Harry Foster is a wonderful 76 y.o. male who is here for continued valuation and management of his multiple paraproteinemia. He notes no acute new symptoms since his last clinic visit.  No focal bone pains.  No new  fevers or chills.  No new fatigue or new lumps or bumps..  Labs done from 09/07/2021 were discussed in detail with him   MEDICAL HISTORY:  Past Medical History:  Diagnosis Date   Arthritis    both knees and hands and left elbow, hip   Depression    treated for depression when wife was very ill   Diverticulosis    hx - no sx   Dysrhythmia    hx A fib, on anticoagulants   GI bleed 2010   GI bleed    hx   Hypertension    Recurrent upper respiratory infection (URI)    completing treatment for sinus infection - with abx    SURGICAL HISTORY: Past Surgical History:  Procedure Laterality Date   CARDIOVERSION  02/01/2011   Procedure: CARDIOVERSION;  Surgeon: Sinclair Grooms, MD;  Location: [place];  Service: Cardiovascular;  Laterality: N/A;   ENDOVENOUS ABLATION SAPHENOUS VEIN W/ LASER Left 03/09/2021   endovenous laser ablation left greater saphenous vein by Servando Snare MD   ENDOVENOUS ABLATION SAPHENOUS VEIN W/ LASER Right 04/20/2021   endovenous laser ablation right greater saphenous vein By Servando Snare MD   SINUS EXPLORATION  01/01/1990   laser sinus surgery   TONSILLECTOMY     age 83   TOTAL KNEE ARTHROPLASTY  10/09/2011   Procedure: [place];  Surgeon: Hessie Dibble, MD;  Location: [place];  Service: Orthopedics;  Laterality: Left;  left total knee arthroplasty   TOTAL KNEE ARTHROPLASTY Right 10/06/2013   DR DALLDORF   TOTAL KNEE ARTHROPLASTY WITH REVISION COMPONENTS Right 10/06/2013   Procedure: TOTAL KNEE ARTHROPLASTY WITH REVISION COMPONENTS;  Surgeon: Hessie Dibble, MD;  Location: [place];  Service: Orthopedics;  Laterality: Right;   TREATMENT FISTULA ANAL  01/01/1990    SOCIAL HISTORY: Social History   Socioeconomic History   Marital status: Widowed    Spouse name: Not on file   Number of children: Not on file   Years of education: Not on file   Highest education level: Not on file  Occupational History   Not on file  Tobacco Use   Smoking  status: Never   Smokeless tobacco: Never  Vaping Use   Vaping Use: Never used  Substance and Sexual Activity   Alcohol use: No   Drug use: No   Sexual activity: Not on file  Other Topics Concern   Not on file  Social History Narrative   Not on file   Social Determinants of Health   Financial Resource Strain: Not on file  Food Insecurity: Not on file  Transportation Needs: Not on file  Physical Activity: Not on file  Stress: Not on file  Social Connections: Not on file  Intimate Partner Violence: Not on file    FAMILY HISTORY: Family History  Problem Relation Age of Onset   Heart disease Mother    Heart disease Father     ALLERGIES:  is allergic to hydrochlorothiazide, levaquin [levofloxacin in d5w], lisinopril, and quinolones.  MEDICATIONS:  Current Outpatient Medications  Medication Sig Dispense Refill   allopurinol (ZYLOPRIM) 100 MG tablet Take 200 mg by mouth daily.     apixaban (ELIQUIS) 5 MG TABS tablet Take 1 tablet (5 mg total) by mouth 2 (two) times daily. 180 tablet 1   ascorbic acid (VITAMIN C) 1000 MG tablet Take 1,000 mg by mouth daily.     aspirin 81 MG EC tablet Take 81 mg by mouth daily. Swallow whole.     chlorhexidine (PERIDEX) 0.12 % solution SMARTSIG:By Mouth     Cholecalciferol (VITAMIN D3) 125 MCG (5000 UT) CAPS Take 1 capsule by mouth daily.     Coenzyme Q10 (CO Q 10) 100 MG CAPS Take 100 mg by mouth daily.     colchicine 0.6 MG tablet Take 0.6 mg by mouth 2 (two) times a week.     DHEA 50 MG TABS Take 50 mg by mouth daily.     digoxin (LANOXIN) 0.125 MG tablet TAKE 1 TABLET BY MOUTH EVERY MONDAY, WEDNESDAY, AND FRIDAY. 45 tablet 1   furosemide (LASIX) 40 MG tablet TAKE 3 TABLETS (120 MG TOTAL) BY MOUTH DAILY. (Patient taking differently: Take 80 mg by mouth daily.) 270 tablet 2   Lactobacillus (ACIDOPHILUS/BIFIDUS PO) Take 100 mg by mouth daily.     Loperamide HCl (IMODIUM PO) Take 1 tablet by mouth daily as needed (loose stools).      LORazepam  (ATIVAN) 1 MG tablet Take 2 tablets 30 minutes prior to leaving house on day of office surgery. Bring third tablet with you to office on day of office surgery. 3 tablet 0   magnesium gluconate (MAGONATE) 500 MG tablet Take 250 mg by mouth daily.     Misc Natural Products (TART CHERRY ADVANCED) CAPS Take 2 capsules by mouth daily.     Multiple Vitamin (MULTIVITAMIN WITH MINERALS) TABS Take 2 tablets by mouth daily.     OLIVE LEAF EXTRACT  PO Take 1 tablet by mouth daily.     olmesartan (BENICAR) 20 MG tablet Take 20 mg by mouth daily.     Omega 3 1000 MG CAPS Take 2,000 mg by mouth.     Pomegranate, Punica granatum, (POMEGRANATE PO) Take 250 mg by mouth daily.     Saw Palmetto, Serenoa repens, (SAW PALMETTO PO) Take 1,000 mg by mouth daily.     triamcinolone (NASACORT) 55 MCG/ACT nasal inhaler Place 1 spray into both nostrils daily as needed (allergies).     Vit C-Cholecalciferol-Rose Hip [phone] MG-UNIT-MG CAPS Take 350 mg by mouth daily.     VITAMIN E PO Take 360 mg by mouth daily.     amoxicillin (AMOXIL) 500 MG capsule      OVER THE COUNTER MEDICATION Take 1 capsule by mouth daily. Berry extract     OVER THE COUNTER MEDICATION Take 1 capsule by mouth daily. Vitamin d 5000 units w/ iodine 1058mg     No current facility-administered medications for this visit.    REVIEW OF SYSTEMS:   . 10 Point review of Systems was done is negative except as noted above.   PHYSICAL EXAMINATION: ECOG PERFORMANCE STATUS: 1 - Symptomatic but completely ambulatory  Exam was given in a chair. .BP 108/75 (BP Location: Left Arm, Patient Position: Sitting)   Pulse 68   Temp 98.4 F (36.9 C) (Temporal)   Resp 15   Wt (!) 303 lb 8 oz (137.7 kg)   SpO2 95%   BMI 41.16 kg/m  . NAD GENERAL:alert, in no acute distress and comfortable SKIN: no acute rashes, no significant lesions EYES: conjunctiva are pink and non-injected, sclera anicteric OROPHARYNX: MMM, no exudates, no oropharyngeal erythema or  ulceration NECK: supple, no JVD LYMPH:  no palpable lymphadenopathy in the cervical, axillary or inguinal regions LUNGS: clear to auscultation b/l with normal respiratory effort HEART: regular rate & rhythm ABDOMEN:  normoactive bowel sounds , non tender, not distended. Extremity: no pedal edema PSYCH: alert & oriented x 3 with fluent speech NEURO: no focal motor/sensory deficits   LABORATORY DATA:  I have reviewed the data as listed     Latest Ref Rng & Units 09/07/2021    2:54 PM 09/07/2020    2:04 PM 03/02/2020    1:30 PM  CBC  WBC 4.0 - 10.5 K/uL 5.2  4.6  5.2   Hemoglobin 13.0 - 17.0 g/dL 14.4  13.4  12.8   Hematocrit 39.0 - 52.0 % 42.4  40.8  40.1   Platelets 150 - 400 K/uL 145  146  196        Latest Ref Rng & Units 09/07/2021    2:54 PM 09/07/2020    2:04 PM 08/31/2020    2:43 PM  CMP  Glucose 70 - 99 mg/dL 129  123  99   BUN 8 - 23 mg/dL 45  56  53   Creatinine 0.61 - 1.24 mg/dL 2.31  2.69  2.33   Sodium 135 - 145 mmol/L 141  142  139   Potassium 3.5 - 5.1 mmol/L 4.4  4.1  4.4   Chloride 98 - 111 mmol/L 108  107  101   CO2 22 - 32 mmol/L 29  25  24    Calcium 8.9 - 10.3 mg/dL 9.8  9.5  9.9   Total Protein 6.5 - 8.1 g/dL 6.3  6.2    Total Bilirubin 0.3 - 1.2 mg/dL 0.6  0.6    Alkaline Phos 38 - 126  U/L 68  68    AST 15 - 41 U/L 18  17    ALT 0 - 44 U/L 16  15       RADIOGRAPHIC STUDIES: I have personally reviewed the radiological images as listed and agreed with the findings in the report. No results found.  ASSESSMENT & PLAN:   76 yo with   1) Monoclonal paraproteinemia 2) CKD - likely from HTN, obesity less likely from plasma cell dyscrasia.  PLAN: -Discussed lab results from 09/07/2021. -CBC shows no anemia CMP with stable chronic kidney disease and no hypercalcemia. Myeloma panel shows stable M spike of 0.7 g/dL. Patient has no new symptoms or lab findings suggestive of progression to active multiple myeloma at this time.  FOLLOW UP: Return to clinic  with Dr. Irene Limbo in 12 months. Please schedule labs 1 week prior to clinic visit  The total time spent in the appointment was 20 minutes*.  All of the patient's questions were answered with apparent satisfaction. The patient knows to call the clinic with any problems, questions or concerns.   Sullivan Lone MD MS AAHIVMS [facility] Hematology/Oncology Physician [facility]  .*Total Encounter Time as defined by the Centers for Medicare and Medicaid Services includes, in addition to the face-to-face time of a patient visit (documented in the note above) non-face-to-face time: obtaining and reviewing outside history, ordering and reviewing medications, tests or procedures, care coordination (communications with other health care professionals or caregivers) and documentation in the medical record.

## 2021-09-26 ENCOUNTER — Other Ambulatory Visit: Payer: Self-pay | Admitting: Interventional Cardiology

## 2021-09-26 DIAGNOSIS — N1832 Chronic kidney disease, stage 3b: Secondary | ICD-10-CM | POA: Diagnosis not present

## 2021-09-29 ENCOUNTER — Telehealth: Payer: Self-pay | Admitting: Hematology

## 2021-09-29 NOTE — Telephone Encounter (Signed)
Scheduled follow-up appointments per 9/15 los. Patient is aware. Mailed calendar.

## 2021-10-04 DIAGNOSIS — I129 Hypertensive chronic kidney disease with stage 1 through stage 4 chronic kidney disease, or unspecified chronic kidney disease: Secondary | ICD-10-CM | POA: Diagnosis not present

## 2021-10-04 DIAGNOSIS — R600 Localized edema: Secondary | ICD-10-CM | POA: Diagnosis not present

## 2021-10-04 DIAGNOSIS — M1000 Idiopathic gout, unspecified site: Secondary | ICD-10-CM | POA: Diagnosis not present

## 2021-10-04 DIAGNOSIS — E785 Hyperlipidemia, unspecified: Secondary | ICD-10-CM | POA: Diagnosis not present

## 2021-10-04 DIAGNOSIS — N1832 Chronic kidney disease, stage 3b: Secondary | ICD-10-CM | POA: Diagnosis not present

## 2021-10-12 ENCOUNTER — Other Ambulatory Visit: Payer: Self-pay | Admitting: Interventional Cardiology

## 2021-10-12 ENCOUNTER — Other Ambulatory Visit: Payer: Self-pay

## 2021-10-16 ENCOUNTER — Other Ambulatory Visit (HOSPITAL_BASED_OUTPATIENT_CLINIC_OR_DEPARTMENT_OTHER): Payer: Self-pay

## 2021-10-16 MED ORDER — COMIRNATY 30 MCG/0.3ML IM SUSP
INTRAMUSCULAR | 0 refills | Status: AC
  Filled 2021-10-16: qty 0.3, 1d supply, fill #0

## 2021-10-16 MED ORDER — FLUAD QUADRIVALENT 0.5 ML IM PRSY
PREFILLED_SYRINGE | INTRAMUSCULAR | 0 refills | Status: AC
  Filled 2021-10-16: qty 0.5, 1d supply, fill #0

## 2021-10-19 DIAGNOSIS — Z79899 Other long term (current) drug therapy: Secondary | ICD-10-CM | POA: Diagnosis not present

## 2021-10-19 DIAGNOSIS — M1A9XX1 Chronic gout, unspecified, with tophus (tophi): Secondary | ICD-10-CM | POA: Diagnosis not present

## 2021-10-19 DIAGNOSIS — M109 Gout, unspecified: Secondary | ICD-10-CM | POA: Diagnosis not present

## 2021-10-19 DIAGNOSIS — M7989 Other specified soft tissue disorders: Secondary | ICD-10-CM | POA: Diagnosis not present

## 2021-10-19 DIAGNOSIS — N184 Chronic kidney disease, stage 4 (severe): Secondary | ICD-10-CM | POA: Diagnosis not present

## 2021-10-19 DIAGNOSIS — I878 Other specified disorders of veins: Secondary | ICD-10-CM | POA: Diagnosis not present

## 2021-10-19 DIAGNOSIS — R600 Localized edema: Secondary | ICD-10-CM | POA: Diagnosis not present

## 2021-11-09 ENCOUNTER — Other Ambulatory Visit: Payer: Self-pay | Admitting: Interventional Cardiology

## 2021-11-13 ENCOUNTER — Other Ambulatory Visit: Payer: Self-pay | Admitting: Interventional Cardiology

## 2021-11-13 ENCOUNTER — Telehealth: Payer: Self-pay | Admitting: Interventional Cardiology

## 2021-11-13 MED ORDER — METOPROLOL SUCCINATE ER 50 MG PO TB24: 100.0000 mg | ORAL_TABLET | Freq: Two times a day (BID) | ORAL | 3 refills | Status: DC

## 2021-11-13 NOTE — Telephone Encounter (Signed)
Pt c/o medication issue:  1. Name of Medication: metoprolol succinate (TOPROL-XL) 50 MG 24 hr tablet   2. How are you currently taking this medication (dosage and times per day)?    3. Are you having a reaction (difficulty breathing--STAT)? no  4. What is your medication issue? Patient was calling to get a refill but its no longer listed under his current medication. Patient states that he wasn't aware of that. Please advise

## 2021-11-13 NOTE — Telephone Encounter (Signed)
Received incoming call from patient.  Patient reports he was taking Toprol XL '50mg'$ , 2 tablets twice daily. He states this medication had been taken off of his list but that it should not have been.  Discussed with Dr. Tamala Mirza, medication was erroneously removed from medication list.   Rx for Metoprolol '50mg'$ , 2 tablets ('100mg'$  total) twice daily sent to [facility].  Patient verbalized understanding and expressed appreciation for assistance.

## 2021-11-13 NOTE — Telephone Encounter (Signed)
Pt is calling back to get update on his medication. Transferred to [place], KRISTIE L

## 2021-12-20 ENCOUNTER — Other Ambulatory Visit: Payer: Self-pay | Admitting: Interventional Cardiology

## 2021-12-20 DIAGNOSIS — I4819 Other persistent atrial fibrillation: Secondary | ICD-10-CM

## 2021-12-21 NOTE — Telephone Encounter (Signed)
Prescription refill request for Eliquis received. Indication: Afib  Last office visit: 04/10/21 Tamala Ammons)  Scr: 2.31 (09/07/21)  Age: 76 Weight: 137.7kg  Appropriate dose and refill sent to requested pharmacy.

## 2022-01-02 ENCOUNTER — Telehealth: Payer: Self-pay | Admitting: Interventional Cardiology

## 2022-01-02 NOTE — Telephone Encounter (Signed)
Pt would like a callback regarding new provider due to Dr. Tamala Hopke retiring. Please advise

## 2022-01-03 NOTE — Telephone Encounter (Signed)
Scheduled appt with Dr. Irish Lack in March.  Patient verbalized understanding and expressed appreciation for assistance.

## 2022-02-19 DIAGNOSIS — M7989 Other specified soft tissue disorders: Secondary | ICD-10-CM | POA: Diagnosis not present

## 2022-02-19 DIAGNOSIS — N184 Chronic kidney disease, stage 4 (severe): Secondary | ICD-10-CM | POA: Diagnosis not present

## 2022-02-19 DIAGNOSIS — Z79899 Other long term (current) drug therapy: Secondary | ICD-10-CM | POA: Diagnosis not present

## 2022-02-19 DIAGNOSIS — I878 Other specified disorders of veins: Secondary | ICD-10-CM | POA: Diagnosis not present

## 2022-02-19 DIAGNOSIS — M109 Gout, unspecified: Secondary | ICD-10-CM | POA: Diagnosis not present

## 2022-02-19 DIAGNOSIS — M1A9XX1 Chronic gout, unspecified, with tophus (tophi): Secondary | ICD-10-CM | POA: Diagnosis not present

## 2022-02-26 DIAGNOSIS — B9689 Other specified bacterial agents as the cause of diseases classified elsewhere: Secondary | ICD-10-CM | POA: Diagnosis not present

## 2022-02-26 DIAGNOSIS — J019 Acute sinusitis, unspecified: Secondary | ICD-10-CM | POA: Diagnosis not present

## 2022-02-26 DIAGNOSIS — H6692 Otitis media, unspecified, left ear: Secondary | ICD-10-CM | POA: Diagnosis not present

## 2022-03-15 DIAGNOSIS — M26609 Unspecified temporomandibular joint disorder, unspecified side: Secondary | ICD-10-CM | POA: Diagnosis not present

## 2022-03-15 DIAGNOSIS — J3089 Other allergic rhinitis: Secondary | ICD-10-CM | POA: Diagnosis not present

## 2022-03-21 DIAGNOSIS — S91312A Laceration without foreign body, left foot, initial encounter: Secondary | ICD-10-CM | POA: Diagnosis not present

## 2022-03-21 DIAGNOSIS — N1832 Chronic kidney disease, stage 3b: Secondary | ICD-10-CM | POA: Diagnosis not present

## 2022-03-21 DIAGNOSIS — I5030 Unspecified diastolic (congestive) heart failure: Secondary | ICD-10-CM | POA: Diagnosis not present

## 2022-03-21 DIAGNOSIS — D0461 Carcinoma in situ of skin of right upper limb, including shoulder: Secondary | ICD-10-CM | POA: Diagnosis not present

## 2022-03-21 DIAGNOSIS — D225 Melanocytic nevi of trunk: Secondary | ICD-10-CM | POA: Diagnosis not present

## 2022-03-21 DIAGNOSIS — L821 Other seborrheic keratosis: Secondary | ICD-10-CM | POA: Diagnosis not present

## 2022-03-21 DIAGNOSIS — Z85828 Personal history of other malignant neoplasm of skin: Secondary | ICD-10-CM | POA: Diagnosis not present

## 2022-03-21 DIAGNOSIS — L814 Other melanin hyperpigmentation: Secondary | ICD-10-CM | POA: Diagnosis not present

## 2022-03-21 DIAGNOSIS — D692 Other nonthrombocytopenic purpura: Secondary | ICD-10-CM | POA: Diagnosis not present

## 2022-03-21 DIAGNOSIS — L570 Actinic keratosis: Secondary | ICD-10-CM | POA: Diagnosis not present

## 2022-03-21 DIAGNOSIS — D1801 Hemangioma of skin and subcutaneous tissue: Secondary | ICD-10-CM | POA: Diagnosis not present

## 2022-03-21 DIAGNOSIS — I482 Chronic atrial fibrillation, unspecified: Secondary | ICD-10-CM | POA: Diagnosis not present

## 2022-03-26 DIAGNOSIS — N1832 Chronic kidney disease, stage 3b: Secondary | ICD-10-CM | POA: Diagnosis not present

## 2022-03-26 LAB — LAB REPORT - SCANNED
Albumin, Urine POC: 13.8
Creatinine, POC: 48 mg/dL
EGFR: 27
Microalb Creat Ratio: 29

## 2022-03-29 ENCOUNTER — Ambulatory Visit: Payer: Medicare Other | Attending: Interventional Cardiology | Admitting: Interventional Cardiology

## 2022-03-29 ENCOUNTER — Encounter: Payer: Self-pay | Admitting: Interventional Cardiology

## 2022-03-29 VITALS — BP 90/64 | HR 75 | Ht 71.5 in | Wt 302.0 lb

## 2022-03-29 DIAGNOSIS — E7849 Other hyperlipidemia: Secondary | ICD-10-CM

## 2022-03-29 DIAGNOSIS — I5032 Chronic diastolic (congestive) heart failure: Secondary | ICD-10-CM

## 2022-03-29 DIAGNOSIS — N184 Chronic kidney disease, stage 4 (severe): Secondary | ICD-10-CM | POA: Diagnosis not present

## 2022-03-29 DIAGNOSIS — I4819 Other persistent atrial fibrillation: Secondary | ICD-10-CM

## 2022-03-29 DIAGNOSIS — Z7901 Long term (current) use of anticoagulants: Secondary | ICD-10-CM

## 2022-03-29 DIAGNOSIS — I7121 Aneurysm of the ascending aorta, without rupture: Secondary | ICD-10-CM

## 2022-03-29 NOTE — Patient Instructions (Signed)
Medication Instructions:  Your physician recommends that you continue on your current medications as directed. Please refer to the Current Medication list given to you today.  *If you need a refill on your cardiac medications before your next appointment, please call your pharmacy*   Lab Work: none If you have labs (blood work) drawn today and your tests are completely normal, you will receive your results only by: [place] (if you have MyChart) OR A paper copy in the mail If you have any lab test that is abnormal or we need to change your treatment, we will call you to review the results.   Testing/Procedures: none   Follow-Up: At [facility], you and your health needs are our priority.  As part of our continuing mission to provide you with exceptional heart care, we have created designated Provider Care Teams.  These Care Teams include your primary Cardiologist (physician) and Advanced Practice Providers (APPs -  Physician Assistants and Nurse Practitioners) who all work together to provide you with the care you need, when you need it.  We recommend signing up for the patient portal called "MyChart".  Sign up information is provided on this After Visit Summary.  MyChart is used to connect with patients for Virtual Visits (Telemedicine).  Patients are able to view lab/test results, encounter notes, upcoming appointments, etc.  Non-urgent messages can be sent to your provider as well.   To learn more about what you can do with MyChart, go to [email or web address].    Your next appointment:   9-12 month(s)  Provider:   Larae Grooms, MD     Other Instructions

## 2022-03-29 NOTE — Progress Notes (Signed)
Cardiology Office Note   Date:  03/29/2022   ID:  [name], [name] May 02, 1945, MRN [id number]  PCP:  Corliss Blacker, MD    No chief complaint on file.  AFib  Wt Readings from Last 3 Encounters:  03/29/22 (!) 302 lb (137 kg)  09/15/21 (!) 303 lb 8 oz (137.7 kg)  04/26/21 291 lb (132 kg)       History of Present Illness: Harry Foster is a 77 y.o. male  former patient of Dr. Tamala Randol, with a hx of  atrial fibrillation, hypertension, chronic diastolic heart failure, and chronic anticoagulation therapy.  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     Because of venous insufficiency and ulcerations on his lower extremity he has seen Dr. Servando Snare and is undergone ablation of varicosities in the left leg.  The right leg iwas treated later.  The lower extremities are not as swollen as they have previously been.  He is happy with how things have gone so far.   He is on furosemide 120 mg/day.  Creatinine is 2.6.  This is being followed by Dr. Osborne Casco his nephrologist.  Olmesartan dose has been decreased to 20 mg/day.    Past Medical History:  Diagnosis Date   Arthritis    both knees and hands and left elbow, hip   Depression    treated for depression when wife was very ill   Diverticulosis    hx - no sx   Dysrhythmia    hx A fib, on anticoagulants   GI bleed 2010   GI bleed    hx   Hypertension    Recurrent upper respiratory infection (URI)    completing treatment for sinus infection - with abx    Past Surgical History:  Procedure Laterality Date   CARDIOVERSION  02/01/2011   Procedure: CARDIOVERSION;  Surgeon: Sinclair Grooms, MD;  Location: [facility];  Service: Cardiovascular;  Laterality: N/A;   ENDOVENOUS ABLATION SAPHENOUS VEIN W/ LASER Left 03/09/2021   endovenous laser ablation left greater saphenous vein by Servando Snare  MD   ENDOVENOUS ABLATION SAPHENOUS VEIN W/ LASER Right 04/20/2021   endovenous laser ablation right greater saphenous vein By Servando Snare MD   SINUS EXPLORATION  01/01/1990   laser sinus surgery   TONSILLECTOMY     age 73   TOTAL KNEE ARTHROPLASTY  10/09/2011   Procedure: [place];  Surgeon: Hessie Dibble, MD;  Location: [place] [facility];  Service: Orthopedics;  Laterality: Left;  left total knee arthroplasty   TOTAL KNEE ARTHROPLASTY Right 10/06/2013   DR DALLDORF   TOTAL KNEE ARTHROPLASTY WITH REVISION COMPONENTS Right 10/06/2013   Procedure: TOTAL KNEE ARTHROPLASTY WITH REVISION COMPONENTS;  Surgeon: Hessie Dibble, MD;  Location: [place];  Service: Orthopedics;  Laterality: Right;   TREATMENT FISTULA ANAL  01/01/1990     Current Outpatient Medications  Medication Sig Dispense Refill   allopurinol (ZYLOPRIM) 100 MG tablet Take 200 mg by mouth daily.     amoxicillin (AMOXIL) 500 MG capsule      aspirin 81 MG EC tablet Take 81 mg by mouth daily. Swallow whole.     cefdinir (OMNICEF) 300 MG capsule Take 300 mg by mouth 2 (two) times daily.     chlorhexidine (PERIDEX) 0.12 % solution SMARTSIG:By Mouth     Cholecalciferol (VITAMIN D3) 125 MCG (  5000 UT) CAPS Take 1 capsule by mouth daily.     Coenzyme Q10 (CO Q 10) 100 MG CAPS Take 100 mg by mouth daily.     colchicine 0.6 MG tablet Take 0.6 mg by mouth 2 (two) times a week.     COVID-19 mRNA Vac-TriS, Pfizer, (COMIRNATY) SUSP injection Inject into the muscle. 0.3 mL 0   digoxin (LANOXIN) 0.125 MG tablet TAKE 1 TABLET BY MOUTH EVERY MONDAY, WEDNESDAY, AND FRIDAY. 45 tablet 1   ELIQUIS 5 MG TABS tablet TAKE 1 TABLET BY MOUTH 2 TIMES DAILY. 180 tablet 1   furosemide (LASIX) 40 MG tablet Take 3 tablets (120 mg total) by mouth daily. 270 tablet 1   influenza vaccine adjuvanted (FLUAD QUADRIVALENT) 0.5 ML injection Inject into the muscle. 0.5 mL 0   Lactobacillus (ACIDOPHILUS/BIFIDUS PO) Take 100 mg by mouth daily.     Loperamide HCl  (IMODIUM PO) Take 1 tablet by mouth daily as needed (loose stools).      metoprolol succinate (TOPROL-XL) 50 MG 24 hr tablet Take 2 tablets (100 mg total) by mouth 2 (two) times daily. Take with or immediately following a meal. 360 tablet 3   Misc Natural Products (TART CHERRY ADVANCED) CAPS Take 2 capsules by mouth daily.     Multiple Vitamin (MULTIVITAMIN WITH MINERALS) TABS Take 2 tablets by mouth daily.     OLIVE LEAF EXTRACT PO Take 1 tablet by mouth daily.     olmesartan (BENICAR) 20 MG tablet Take 20 mg by mouth daily.     Omega 3 1000 MG CAPS Take 2,000 mg by mouth.     OVER THE COUNTER MEDICATION Take 1 capsule by mouth daily. Berry extract     OVER THE COUNTER MEDICATION Take 1 capsule by mouth daily. Vitamin d 5000 units w/ iodine 1032mcg     Pomegranate, Punica granatum, (POMEGRANATE PO) Take 250 mg by mouth daily.     Saw Palmetto, Serenoa repens, (SAW PALMETTO PO) Take 1,000 mg by mouth daily.     triamcinolone (NASACORT) 55 MCG/ACT nasal inhaler Place 1 spray into both nostrils daily as needed (allergies).     UNABLE TO FIND Take 500 mg by mouth 2 (two) times daily. Med Name: terminalia bellerica     Vit C-Cholecalciferol-Rose Hip [id number] MG-UNIT-MG CAPS Take 350 mg by mouth daily.     VITAMIN E PO Take 360 mg by mouth daily.     ascorbic acid (VITAMIN C) 1000 MG tablet Take 1,000 mg by mouth daily. (Patient not taking: Reported on 03/29/2022)     DHEA 50 MG TABS Take 50 mg by mouth daily. (Patient not taking: Reported on 03/29/2022)     LORazepam (ATIVAN) 1 MG tablet Take 2 tablets 30 minutes prior to leaving house on day of office surgery. Bring third tablet with you to office on day of office surgery. (Patient not taking: Reported on 03/29/2022) 3 tablet 0   magnesium gluconate (MAGONATE) 500 MG tablet Take 250 mg by mouth daily. (Patient not taking: Reported on 03/29/2022)     No current facility-administered medications for this visit.    Allergies:   Hydrochlorothiazide,  Levaquin [levofloxacin in d5w], Lisinopril, Lisinopril-hydrochlorothiazide, and Quinolones    Social History:  The patient  reports that he has never smoked. He has never used smokeless tobacco. He reports that he does not drink alcohol and does not use drugs.   Family History:  The patient's family history includes Heart disease in his father and mother.  ROS:  Please see the history of present illness.   Otherwise, review of systems are positive for bruising.   All other systems are reviewed and negative.    PHYSICAL EXAM: VS:  BP 90/64   Pulse 75   Ht 5' 11.5" (1.816 m)   Wt (!) 302 lb (137 kg)   SpO2 95%   BMI 41.53 kg/m  , BMI Body mass index is 41.53 kg/m. GEN: Well nourished, well developed, in no acute distress HEENT: normal Neck: no JVD, carotid bruits, or masses Cardiac: irregularly irregular; no murmurs, rubs, or gallops,no edema  Respiratory:  clear to auscultation bilaterally, normal work of breathing GI: soft, nontender, nondistended, + BS, obese MS: no deformity or atrophy Skin: warm and dry, no rash Neuro:  Strength and sensation are intact Psych: euthymic mood, full affect   EKG:   The ekg ordered today demonstrates AFib, rate controlled   Recent Labs: 09/07/2021: ALT 16; BUN 45; Creatinine 2.31; Hemoglobin 14.4; Platelet Count 145; Potassium 4.4; Sodium 141   Lipid Panel No results found for: "CHOL", "TRIG", "HDL", "CHOLHDL", "VLDL", "LDLCALC", "LDLDIRECT"   Other studies Reviewed: Additional studies/ records that were reviewed today with results demonstrating: Cr 2.56.   ASSESSMENT AND PLAN:  AFib: On Eliquis.  No bleeding issues.  Rate controlled.  THoracic aorta: 41 mm thoracic aorta.  No further imaging to avoid dye exposure. Chronic diastolic heart failure: Appears euvolemic.  CKD stage 4: Stay hydrated.  Avoid nephrotoxins.  Anticoagulated:coumadin for stroke prevention.    Current medicines are reviewed at length with the patient today.   The patient concerns regarding his medicines were addressed.  The following changes have been made:  No change  Labs/ tests ordered today include:  No orders of the defined types were placed in this encounter.   Recommend 150 minutes/week of aerobic exercise Low fat, low carb, high fiber diet recommended  Disposition:   FU in 1 year   Signed, Larae Grooms, MD  03/29/2022 5:14 PM    [facility] [place], [place], [place]  [address] Phone: [phone]; Fax: [phone]

## 2022-03-30 NOTE — Addendum Note (Signed)
Addended by: Sharee Holster R on: 03/30/2022 04:13 PM   Modules accepted: Orders

## 2022-04-03 DIAGNOSIS — I129 Hypertensive chronic kidney disease with stage 1 through stage 4 chronic kidney disease, or unspecified chronic kidney disease: Secondary | ICD-10-CM | POA: Diagnosis not present

## 2022-04-03 DIAGNOSIS — N1832 Chronic kidney disease, stage 3b: Secondary | ICD-10-CM | POA: Diagnosis not present

## 2022-04-03 DIAGNOSIS — M1000 Idiopathic gout, unspecified site: Secondary | ICD-10-CM | POA: Diagnosis not present

## 2022-04-03 DIAGNOSIS — R600 Localized edema: Secondary | ICD-10-CM | POA: Diagnosis not present

## 2022-04-09 ENCOUNTER — Other Ambulatory Visit: Payer: Self-pay | Admitting: Interventional Cardiology

## 2022-05-16 DIAGNOSIS — J329 Chronic sinusitis, unspecified: Secondary | ICD-10-CM | POA: Diagnosis not present

## 2022-06-14 ENCOUNTER — Other Ambulatory Visit: Payer: Self-pay | Admitting: Interventional Cardiology

## 2022-06-14 DIAGNOSIS — I4819 Other persistent atrial fibrillation: Secondary | ICD-10-CM

## 2022-06-15 NOTE — Telephone Encounter (Signed)
Prescription refill request for Eliquis received. Indication:afib Last office visit:3/24 Scr:2.39  3/24 Age: 77 Weight:137  kg  Prescription refilled

## 2022-06-19 DIAGNOSIS — M1A9XX1 Chronic gout, unspecified, with tophus (tophi): Secondary | ICD-10-CM | POA: Diagnosis not present

## 2022-06-19 DIAGNOSIS — Z79899 Other long term (current) drug therapy: Secondary | ICD-10-CM | POA: Diagnosis not present

## 2022-06-19 DIAGNOSIS — M7989 Other specified soft tissue disorders: Secondary | ICD-10-CM | POA: Diagnosis not present

## 2022-06-19 DIAGNOSIS — I878 Other specified disorders of veins: Secondary | ICD-10-CM | POA: Diagnosis not present

## 2022-06-19 DIAGNOSIS — N184 Chronic kidney disease, stage 4 (severe): Secondary | ICD-10-CM | POA: Diagnosis not present

## 2022-06-26 DIAGNOSIS — M109 Gout, unspecified: Secondary | ICD-10-CM | POA: Diagnosis not present

## 2022-06-26 DIAGNOSIS — I509 Heart failure, unspecified: Secondary | ICD-10-CM | POA: Diagnosis not present

## 2022-06-26 DIAGNOSIS — R7303 Prediabetes: Secondary | ICD-10-CM | POA: Diagnosis not present

## 2022-06-26 DIAGNOSIS — N1832 Chronic kidney disease, stage 3b: Secondary | ICD-10-CM | POA: Diagnosis not present

## 2022-06-26 DIAGNOSIS — D6869 Other thrombophilia: Secondary | ICD-10-CM | POA: Diagnosis not present

## 2022-06-26 DIAGNOSIS — I10 Essential (primary) hypertension: Secondary | ICD-10-CM | POA: Diagnosis not present

## 2022-06-26 DIAGNOSIS — I719 Aortic aneurysm of unspecified site, without rupture: Secondary | ICD-10-CM | POA: Diagnosis not present

## 2022-06-26 DIAGNOSIS — I4821 Permanent atrial fibrillation: Secondary | ICD-10-CM | POA: Diagnosis not present

## 2022-06-26 DIAGNOSIS — I130 Hypertensive heart and chronic kidney disease with heart failure and stage 1 through stage 4 chronic kidney disease, or unspecified chronic kidney disease: Secondary | ICD-10-CM | POA: Diagnosis not present

## 2022-06-26 DIAGNOSIS — Z0000 Encounter for general adult medical examination without abnormal findings: Secondary | ICD-10-CM | POA: Diagnosis not present

## 2022-07-24 IMAGING — MR MR MRA CHEST W/ OR W/O CM
18 series · 18 of 18 positions shown · IV contrast (Gadavist)
Comparison: None.

CLINICAL DATA: 75-year-old male with history of aortic aneurysm.

EXAM:
MRA Chest with and without contrast
TECHNIQUE: Angiographic images of the chest were obtained using MRA technique
without and with intravenous contrast.
CONTRAST:  Ten mL Gadavist, intravenous

[Series 2: t2_trufi_tra_p2_bh · axial · 8.0mm · 0.62mm/px · 1 of 28 slices shown]
[im 1/28]
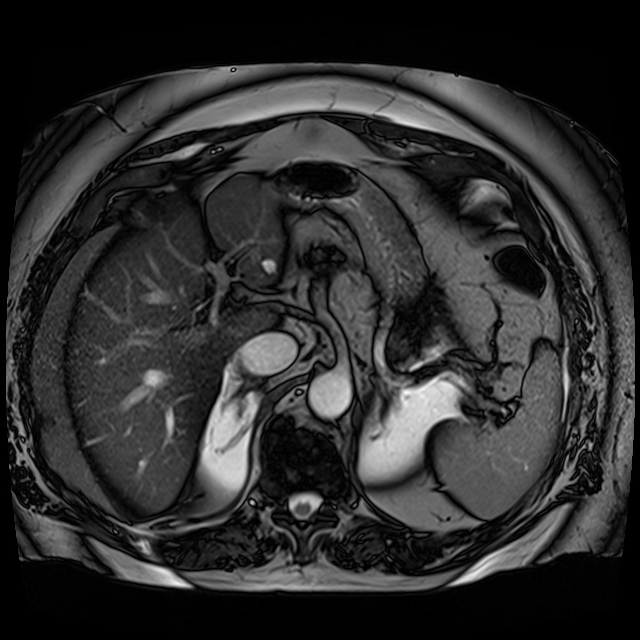

[Series 4: axial_db_haste_loc · axial · 7.0mm · 1.56mm/px · 1 of 30 slices shown]
[im 1/30]
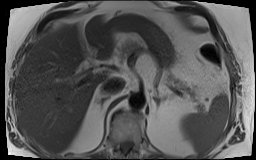

[Series 5: (person_name)_(person_name)_(person_name) · sagittal · 8.0mm · 1.79mm/px · 1 of 19 slices shown (1 of 8)]
[im 1/19]
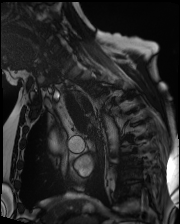

[Series 5: (person_name)_(person_name)_(person_name) · sagittal · 8.0mm · 1.79mm/px · 1 of 19 slices shown (2 of 8)]
[im 1/19]
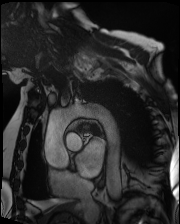

[Series 5: (person_name)_(person_name)_(person_name) · sagittal · 8.0mm · 1.79mm/px · 1 of 19 slices shown (3 of 8)]
[im 1/19]
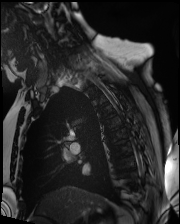

[Series 5: (person_name)_(person_name)_(person_name) · sagittal · 8.0mm · 1.79mm/px · 1 of 19 slices shown (4 of 8)]
[im 1/19]
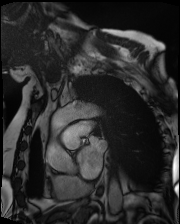

[Series 5: (person_name)_(person_name)_(person_name) · sagittal · 8.0mm · 1.79mm/px · 1 of 19 slices shown (5 of 8)]
[im 1/19]
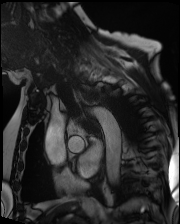

[Series 5: (person_name)_(person_name)_(person_name) · sagittal · 8.0mm · 1.79mm/px · 1 of 19 slices shown (6 of 8)]
[im 1/19]
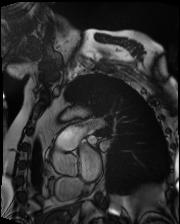

[Series 5: (person_name)_(person_name)_(person_name) · sagittal · 8.0mm · 1.79mm/px · 1 of 19 slices shown (7 of 8)]
[im 1/19]
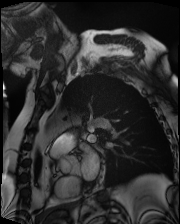

[Series 5: (person_name)_(person_name)_(person_name) · sagittal · 8.0mm · 1.79mm/px · 1 of 19 slices shown (8 of 8)]
[im 1/19]
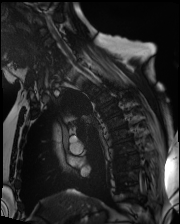

[Series 6: T1 dynamic · axial · non-contrast · 3.3mm · 1.18mm/px · 1 of 80 slices shown]
[im 1/80]
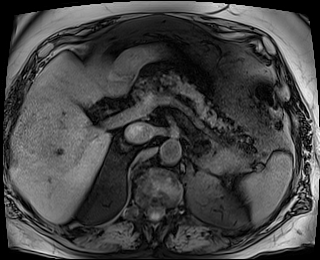

[Series 7: angio_fl3d_sag_pre · sagittal · 1.1mm · 1.17mm/px · 1 of 112 slices shown]
[im 1/112]
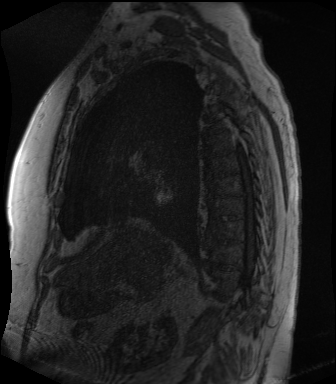

[Series 10: candy cane ce-arterial · sagittal · arterial · 1.1mm · 1.17mm/px · 1 of 112 slices shown]
[im 1/112]
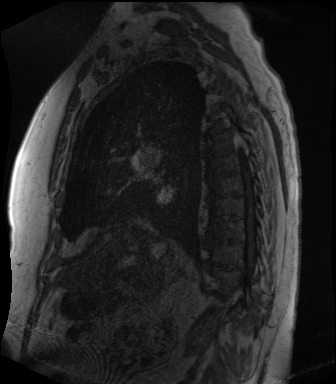

[Series 11: candy cane ce-arterial_sub · sagittal · 1.1mm · 1.17mm/px · 1 of 112 slices shown]
[im 1/112]
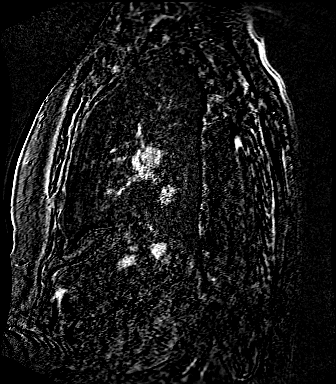

[Series 13: candy cane ce-venous · sagittal · portal-venous · 1.1mm · 1.17mm/px · 1 of 112 slices shown]
[im 1/112]
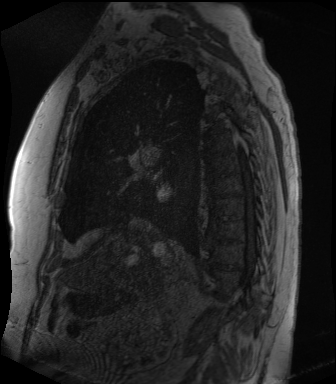

[Series 14: candy cane ce-venous_sub · sagittal · 1.1mm · 1.17mm/px · 1 of 112 slices shown]
[im 1/112]
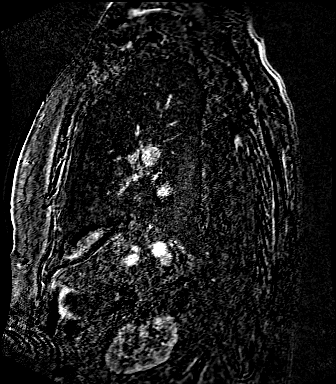

[Series 16: T1 dynamic post-contrast · axial · 3.3mm · 1.18mm/px · 1 of 80 slices shown]
[im 1/80]
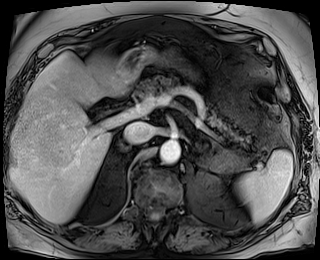

[Series 102: mpr auxiliary images · axial · 3.3mm · 1.18mm/px · 1 of 1 slices shown]
[im 1/1]
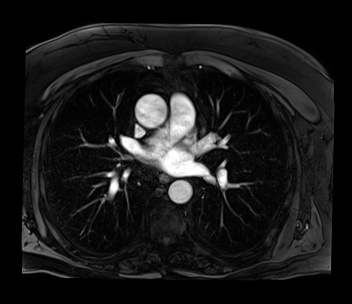

[18 of 18 positions shown; findings below may reference images not displayed]

FINDINGS: Cardiovascular: Preferential opacification of the thoracic aorta. No
evidence of thoracic aortic dissection. Normal heart size. No
pericardial effusion.

Sinues of Valsalva: 34 mm 34 x 31 mm

Sinotubular Junction: 33 mm

Ascending Aorta: 41 mm

Aortic Arch: 31 mm

Descending aorta: 27 mm at the level of the carina

Branch vessels: The left vertebral artery arises directly from the
aortic arch, just proximal to the left subclavian ostium.

Coronary arteries: Normal origins and courses.

Main pulmonary artery: 30 mm. No evidence of central pulmonary
embolism.

Pulmonary veins: No anomalous pulmonary venous return. No evidence
of left atrial appendage thrombus.

Upper abdominal vasculature: Within normal limits.

Mediastinum/Nodes: No enlarged mediastinal, hilar, or axillary lymph
nodes. Thyroid gland, trachea, and esophagus demonstrate no
significant findings.

Lungs/Pleura: No focal consolidations. No suspicious pulmonary
nodules. No pleural effusion or pneumothorax.

Upper Abdomen: The visualized upper abdomen is within normal limits.

Musculoskeletal: No chest wall abnormality. No acute or significant
osseous findings.
IMPRESSION: Vascular:

Fusiform aneurysm of the ascending thoracic aorta measuring up to 41
mm. Recommend annual imaging followup by CTA or MRA. This
recommendation follows 2494
ACCF/AHA/AATS/ACR/ASA/SCA/KLEVER/TUYET/SABAN/DARRIUS Guidelines for the
Diagnosis and Management of Patients with Thoracic Aortic Disease.
Circulation. 2494; 121: [id number]. Aortic aneurysm NOS ([id number])

Non-Vascular:

No acute intrathoracic abnormality.

## 2022-09-07 ENCOUNTER — Other Ambulatory Visit: Payer: Self-pay

## 2022-09-07 DIAGNOSIS — D472 Monoclonal gammopathy: Secondary | ICD-10-CM

## 2022-09-10 ENCOUNTER — Inpatient Hospital Stay: Payer: Medicare Other | Attending: Hematology

## 2022-09-10 DIAGNOSIS — N1832 Chronic kidney disease, stage 3b: Secondary | ICD-10-CM | POA: Insufficient documentation

## 2022-09-10 DIAGNOSIS — D472 Monoclonal gammopathy: Secondary | ICD-10-CM

## 2022-09-10 LAB — CBC WITH DIFFERENTIAL (CANCER CENTER ONLY)
Abs Immature Granulocytes: 0.01 10*3/uL (ref 0.00–0.07)
Basophils Absolute: 0 10*3/uL (ref 0.0–0.1)
Basophils Relative: 1 %
Eosinophils Absolute: 0.1 10*3/uL (ref 0.0–0.5)
Eosinophils Relative: 3 %
HCT: 43.5 % (ref 39.0–52.0)
Hemoglobin: 14.6 g/dL (ref 13.0–17.0)
Immature Granulocytes: 0 %
Lymphocytes Relative: 21 %
Lymphs Abs: 1.1 10*3/uL (ref 0.7–4.0)
MCH: 33.7 pg (ref 26.0–34.0)
MCHC: 33.6 g/dL (ref 30.0–36.0)
MCV: 100.5 fL — ABNORMAL HIGH (ref 80.0–100.0)
Monocytes Absolute: 0.5 10*3/uL (ref 0.1–1.0)
Monocytes Relative: 10 %
Neutro Abs: 3.5 10*3/uL (ref 1.7–7.7)
Neutrophils Relative %: 65 %
Platelet Count: 144 10*3/uL — ABNORMAL LOW (ref 150–400)
RBC: 4.33 MIL/uL (ref 4.22–5.81)
RDW: 13.9 % (ref 11.5–15.5)
WBC Count: 5.3 10*3/uL (ref 4.0–10.5)
nRBC: 0 % (ref 0.0–0.2)

## 2022-09-10 LAB — CMP (CANCER CENTER ONLY)
ALT: 15 U/L (ref 0–44)
AST: 17 U/L (ref 15–41)
Albumin: 3.9 g/dL (ref 3.5–5.0)
Alkaline Phosphatase: 55 U/L (ref 38–126)
Anion gap: 7 (ref 5–15)
BUN: 43 mg/dL — ABNORMAL HIGH (ref 8–23)
CO2: 26 mmol/L (ref 22–32)
Calcium: 9.6 mg/dL (ref 8.9–10.3)
Chloride: 107 mmol/L (ref 98–111)
Creatinine: 2.25 mg/dL — ABNORMAL HIGH (ref 0.61–1.24)
GFR, Estimated: 29 mL/min — ABNORMAL LOW (ref >60)
Glucose, Bld: 93 mg/dL (ref 70–99)
Potassium: 4.3 mmol/L (ref 3.5–5.1)
Sodium: 140 mmol/L (ref 135–145)
Total Bilirubin: 0.7 mg/dL (ref 0.3–1.2)
Total Protein: 6.6 g/dL (ref 6.5–8.1)

## 2022-09-17 ENCOUNTER — Inpatient Hospital Stay: Payer: Medicare Other | Admitting: Hematology

## 2022-09-17 VITALS — BP 110/77 | HR 62 | Temp 97.9°F | Resp 16 | Wt 305.6 lb

## 2022-09-17 DIAGNOSIS — D472 Monoclonal gammopathy: Secondary | ICD-10-CM

## 2022-09-17 DIAGNOSIS — N1832 Chronic kidney disease, stage 3b: Secondary | ICD-10-CM | POA: Diagnosis not present

## 2022-09-17 NOTE — Progress Notes (Signed)
HEMATOLOGY/ONCOLOGY CLINIC NOTE  Date of Service: 09/17/22     Patient Care Team: Joya Martyr, MD as PCP - General (Internal Medicine) Corky Crafts, MD as PCP - Cardiology (Cardiology)  CHIEF COMPLAINTS/PURPOSE OF CONSULTATION:  followup for monoclonal paraproteinemia  HISTORY OF PRESENTING ILLNESS:   Harry Foster is a wonderful 77 y.o. male who has been referred to [place] by Dr. Valentino Nose for evaluation and management of M Spike. The pt reports that he is doing well overall.   The pt reports that he has Afib, which is being managed with Warfarin by Dr. Verdis Prime. His HTN has been under control. Pt is being followed by Dr. Deanne Coffer for Gout which is not well-controlled. Pt reports finger stiffness and mobility challenges in the last 15 months. Pt has been avoiding sugary liquids and red meats. His uric acid is currently greater than 11. He is considering starting Pegloticase to control his Gout. Pt has been dealing with renal dysfunction for the last 10 years.   Most recent lab results (09/23/2019) of CBC w/diff & CMP is as follows: all values are WNL except for BUN at 48, Creatinine at 2.40, GFR Est Non Af Am at 26. 09/23/2019 SPEP shows all values are WNL except for M Spike at 0.8 09/23/2019 Kappa light chains at 34.4, Lambda light chans at 42.8, K/L light chain ratio at 0.8  On review of systems, pt reports joint pain/stiffness and denies new bone pain, abdominal pain, constipation, dysuria and any other symptoms.   On PMHx the pt reports Arthritis, Atrial fibrillation, HTN, Gout, Cardioversion, Stage 3b CKD.  On Family Hx the pt reports that his mother had breast and cervical cancer in her 29's and his sister had breast cancer in her 67-50's.   INTERVAL HISTORY:  Harry Foster is a wonderful 77 y.o. male who is here for continued valuation and management of his multiple paraproteinemia.  Patient was last seen by me on 09/15/2021 and he was doing well overall.    Patient notes he has been doing well overall without any new or severe medical concerns. He notes he has gouts and has been taking Allopurinol 200 mg and colchicine 0.6 mg. Patient notes his gout symptoms are controlled, but his uric acid levels are still elevated.   He has bene staying well hydrated and has been eating well. Denies high consumption of red meat.   He denies any new infection issues, fever, chills, night sweats, unexpected weight loss, abdominal pain, chest pain, back pain, or leg swelling. He denies any new bone pain, hip pain, or joint pain.   MEDICAL HISTORY:  Past Medical History:  Diagnosis Date   Arthritis    both knees and hands and left elbow, hip   Depression    treated for depression when wife was very ill   Diverticulosis    hx - no sx   Dysrhythmia    hx A fib, on anticoagulants   GI bleed 2010   GI bleed    hx   Hypertension    Recurrent upper respiratory infection (URI)    completing treatment for sinus infection - with abx    SURGICAL HISTORY: Past Surgical History:  Procedure Laterality Date   CARDIOVERSION  02/01/2011   Procedure: CARDIOVERSION;  Surgeon: Lesleigh Noe, MD;  Location: [facility] OR;  Service: Cardiovascular;  Laterality: N/A;   ENDOVENOUS ABLATION SAPHENOUS VEIN W/ LASER Left 03/09/2021   endovenous laser ablation left greater saphenous  vein by Lemar Livings MD   ENDOVENOUS ABLATION SAPHENOUS VEIN W/ LASER Right 04/20/2021   endovenous laser ablation right greater saphenous vein By Lemar Livings MD   SINUS EXPLORATION  01/01/1990   laser sinus surgery   TONSILLECTOMY     age 23   TOTAL KNEE ARTHROPLASTY  10/09/2011   Procedure: TOTAL KNEE ARTHROPLASTY;  Surgeon: Velna Ochs, MD;  Location: [facility] OR;  Service: Orthopedics;  Laterality: Left;  left total knee arthroplasty   TOTAL KNEE ARTHROPLASTY Right 10/06/2013   DR DALLDORF   TOTAL KNEE ARTHROPLASTY WITH REVISION COMPONENTS Right 10/06/2013   Procedure: TOTAL KNEE  ARTHROPLASTY WITH REVISION COMPONENTS;  Surgeon: Velna Ochs, MD;  Location: [facility] OR;  Service: Orthopedics;  Laterality: Right;   TREATMENT FISTULA ANAL  01/01/1990    SOCIAL HISTORY: Social History   Socioeconomic History   Marital status: Widowed    Spouse name: Not on file   Number of children: Not on file   Years of education: Not on file   Highest education level: Not on file  Occupational History   Not on file  Tobacco Use   Smoking status: Never   Smokeless tobacco: Never  Vaping Use   Vaping status: Never Used  Substance and Sexual Activity   Alcohol use: No   Drug use: No   Sexual activity: Not on file  Other Topics Concern   Not on file  Social History Narrative   Not on file   Social Determinants of Health   Financial Resource Strain: Not on file  Food Insecurity: Not on file  Transportation Needs: Not on file  Physical Activity: Not on file  Stress: Not on file  Social Connections: Not on file  Intimate Partner Violence: Not on file    FAMILY HISTORY: Family History  Problem Relation Age of Onset   Heart disease Mother    Heart disease Father     ALLERGIES:  is allergic to hydrochlorothiazide, levaquin [levofloxacin in d5w], lisinopril, lisinopril-hydrochlorothiazide, and quinolones.  MEDICATIONS:  Current Outpatient Medications  Medication Sig Dispense Refill   allopurinol (ZYLOPRIM) 100 MG tablet Take 200 mg by mouth daily.     amoxicillin (AMOXIL) 500 MG capsule      ascorbic acid (VITAMIN C) 1000 MG tablet Take 1,000 mg by mouth daily. (Patient not taking: Reported on 03/29/2022)     aspirin 81 MG EC tablet Take 81 mg by mouth daily. Swallow whole.     cefdinir (OMNICEF) 300 MG capsule Take 300 mg by mouth 2 (two) times daily.     chlorhexidine (PERIDEX) 0.12 % solution SMARTSIG:By Mouth     Cholecalciferol (VITAMIN D3) 125 MCG (5000 UT) CAPS Take 1 capsule by mouth daily.     Coenzyme Q10 (CO Q 10) 100 MG CAPS Take 100 mg by mouth daily.      colchicine 0.6 MG tablet Take 0.6 mg by mouth 2 (two) times a week.     COVID-19 mRNA Vac-TriS, Pfizer, (COMIRNATY) SUSP injection Inject into the muscle. 0.3 mL 0   DHEA 50 MG TABS Take 50 mg by mouth daily. (Patient not taking: Reported on 03/29/2022)     digoxin (LANOXIN) 0.125 MG tablet TAKE 1 TABLET BY MOUTH EVERY MONDAY, WEDNESDAY, AND FRIDAY. 45 tablet 2   ELIQUIS 5 MG TABS tablet TAKE 1 TABLET BY MOUTH 2 TIMES DAILY. 180 tablet 1   furosemide (LASIX) 40 MG tablet TAKE 3 TABLETS (120 MG TOTAL) BY MOUTH DAILY. 270 tablet 2  influenza vaccine adjuvanted (FLUAD QUADRIVALENT) 0.5 ML injection Inject into the muscle. 0.5 mL 0   Lactobacillus (ACIDOPHILUS/BIFIDUS PO) Take 100 mg by mouth daily.     Loperamide HCl (IMODIUM PO) Take 1 tablet by mouth daily as needed (loose stools).      LORazepam (ATIVAN) 1 MG tablet Take 2 tablets 30 minutes prior to leaving house on day of office surgery. Bring third tablet with you to office on day of office surgery. (Patient not taking: Reported on 03/29/2022) 3 tablet 0   magnesium gluconate (MAGONATE) 500 MG tablet Take 250 mg by mouth daily. (Patient not taking: Reported on 03/29/2022)     metoprolol succinate (TOPROL-XL) 50 MG 24 hr tablet Take 2 tablets (100 mg total) by mouth 2 (two) times daily. Take with or immediately following a meal. 360 tablet 3   Misc Natural Products (TART CHERRY ADVANCED) CAPS Take 2 capsules by mouth daily.     Multiple Vitamin (MULTIVITAMIN WITH MINERALS) TABS Take 2 tablets by mouth daily.     OLIVE LEAF EXTRACT PO Take 1 tablet by mouth daily.     olmesartan (BENICAR) 20 MG tablet Take 20 mg by mouth daily.     Omega 3 1000 MG CAPS Take 2,000 mg by mouth.     OVER THE COUNTER MEDICATION Take 1 capsule by mouth daily. Berry extract     OVER THE COUNTER MEDICATION Take 1 capsule by mouth daily. Vitamin d 5000 units w/ iodine     Pomegranate, Punica granatum, (POMEGRANATE PO) Take 250 mg by mouth daily.     Saw  Palmetto, Serenoa repens, (SAW PALMETTO PO) Take 1,000 mg by mouth daily.     triamcinolone (NASACORT) 55 MCG/ACT nasal inhaler Place 1 spray into both nostrils daily as needed (allergies).     UNABLE TO FIND Take 500 mg by mouth 2 (two) times daily. Med Name: terminalia bellerica     Vit C-Cholecalciferol-Rose Hip 780 226 6166-20 MG-UNIT-MG CAPS Take 350 mg by mouth daily.     VITAMIN E PO Take 360 mg by mouth daily.     No current facility-administered medications for this visit.    REVIEW OF SYSTEMS:   . 10 Point review of Systems was done is negative except as noted above.   PHYSICAL EXAMINATION: ECOG PERFORMANCE STATUS: 1 - Symptomatic but completely ambulatory  Exam was given in a chair. .BP 110/77   Pulse 62   Temp 97.9 F (36.6 C)   Resp 16   Wt (!) 305 lb 9.6 oz (138.6 kg)   SpO2 98%   BMI 42.03 kg/m  . NAD GENERAL:alert, in no acute distress and comfortable SKIN: no acute rashes, no significant lesions EYES: conjunctiva are pink and non-injected, sclera anicteric OROPHARYNX: MMM, no exudates, no oropharyngeal erythema or ulceration NECK: supple, no JVD LYMPH:  no palpable lymphadenopathy in the cervical, axillary or inguinal regions LUNGS: clear to auscultation b/l with normal respiratory effort HEART: regular rate & rhythm ABDOMEN:  normoactive bowel sounds , non tender, not distended. Extremity: no pedal edema PSYCH: alert & oriented x 3 with fluent speech NEURO: no focal motor/sensory deficits   LABORATORY DATA:  I have reviewed the data as listed     Latest Ref Rng & Units 09/10/2022    1:43 PM 09/07/2021    2:54 PM 09/07/2020    2:04 PM  CBC  WBC 4.0 - 10.5 K/uL 5.3  5.2  4.6   Hemoglobin 13.0 - 17.0 g/dL 40.1  02.7  13.4   Hematocrit 39.0 - 52.0 % 43.5  42.4  40.8   Platelets 150 - 400 K/uL 144  145  146        Latest Ref Rng & Units 09/10/2022    1:43 PM 09/07/2021    2:54 PM 09/07/2020    2:04 PM  CMP  Glucose 70 - 99 mg/dL 93  413  244   BUN 8 - 23  mg/dL 43  45  56   Creatinine 0.61 - 1.24 mg/dL 0.10  2.72  5.36   Sodium 135 - 145 mmol/L 140  141  142   Potassium 3.5 - 5.1 mmol/L 4.3  4.4  4.1   Chloride 98 - 111 mmol/L 107  108  107   CO2 22 - 32 mmol/L 26  29  25    Calcium 8.9 - 10.3 mg/dL 9.6  9.8  9.5   Total Protein 6.5 - 8.1 g/dL 6.6  6.3  6.2   Total Bilirubin 0.3 - 1.2 mg/dL 0.7  0.6  0.6   Alkaline Phos 38 - 126 U/L 55  68  68   AST 15 - 41 U/L 17  18  17    ALT 0 - 44 U/L 15  16  15       RADIOGRAPHIC STUDIES: I have personally reviewed the radiological images as listed and agreed with the findings in the report. No results found.  ASSESSMENT & PLAN:   77 yo with   1) Monoclonal paraproteinemia 2) CKD - likely from HTN, obesity less likely from plasma cell dyscrasia.  PLAN: -Discussed lab results from 09/10/2022 with the patient. CBC shows decreased platelets of 144 K, but stable overall. CMP shows elevated BUN at 43 and elevated creatinine at 2.25, but improved.  -Discussed multiple myeloma panel results from 09/10/2022 with the patient. Shows elevated M-protein of 0.9. Last year it was 0.7.  -Patient has no new symptoms or lab findings suggestive of progression to active multiple myeloma at this time. 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  All of the patient's questions were answered with apparent satisfaction. The patient knows to call the clinic with any problems, questions or concerns.   Wyvonnia Lora MD MS AAHIVMS [facility] Hematology/Oncology Physician [facility]  .*Total Encounter Time as defined by the Centers for Medicare and Medicaid Services includes, in addition to the face-to-face time of a patient visit (documented in the note above)  non-face-to-face time: obtaining and reviewing outside history, ordering and reviewing medications, tests or procedures, care coordination (communications with other health care professionals or caregivers) and documentation in the medical record.   I,Param Shah,acting as a Neurosurgeon for Wyvonnia Lora, MD.,have documented all relevant documentation on the behalf of Wyvonnia Lora, MD,as directed by  Wyvonnia Lora, MD while in the presence of Wyvonnia Lora, MD.  .I have reviewed the above documentation for accuracy and completeness, and I agree with the above. Johney Maine MD

## 2022-09-18 LAB — MULTIPLE MYELOMA PANEL, SERUM
Albumin SerPl Elph-Mcnc: 3.5 g/dL (ref 2.9–4.4)
Albumin/Glob SerPl: 1.4 (ref 0.7–1.7)
Alpha 1: 0.2 g/dL (ref 0.0–0.4)
Alpha2 Glob SerPl Elph-Mcnc: 0.6 g/dL (ref 0.4–1.0)
B-Globulin SerPl Elph-Mcnc: 0.8 g/dL (ref 0.7–1.3)
Gamma Glob SerPl Elph-Mcnc: 1.1 g/dL (ref 0.4–1.8)
Globulin, Total: 2.6 g/dL (ref 2.2–3.9)
IgA: 47 mg/dL — ABNORMAL LOW (ref 61–437)
IgG (Immunoglobin G), Serum: 1167 mg/dL (ref 603–1613)
IgM (Immunoglobulin M), Srm: 55 mg/dL (ref 15–143)
M Protein SerPl Elph-Mcnc: 0.9 g/dL — ABNORMAL HIGH
Total Protein ELP: 6.1 g/dL (ref 6.0–8.5)

## 2022-09-20 ENCOUNTER — Other Ambulatory Visit (HOSPITAL_BASED_OUTPATIENT_CLINIC_OR_DEPARTMENT_OTHER): Payer: Self-pay

## 2022-09-20 MED ORDER — FLUAD 0.5 ML IM SUSY
0.5000 mL | PREFILLED_SYRINGE | Freq: Once | INTRAMUSCULAR | 0 refills | Status: AC
  Filled 2022-09-20: qty 0.5, 1d supply, fill #0

## 2022-09-20 MED ORDER — COMIRNATY 30 MCG/0.3ML IM SUSY
0.3000 mL | PREFILLED_SYRINGE | Freq: Once | INTRAMUSCULAR | 0 refills | Status: AC
  Filled 2022-09-20: qty 0.3, 1d supply, fill #0

## 2022-09-21 ENCOUNTER — Other Ambulatory Visit (HOSPITAL_BASED_OUTPATIENT_CLINIC_OR_DEPARTMENT_OTHER): Payer: Self-pay

## 2022-10-02 DIAGNOSIS — N1832 Chronic kidney disease, stage 3b: Secondary | ICD-10-CM | POA: Diagnosis not present

## 2022-10-08 DIAGNOSIS — E785 Hyperlipidemia, unspecified: Secondary | ICD-10-CM | POA: Diagnosis not present

## 2022-10-08 DIAGNOSIS — N1832 Chronic kidney disease, stage 3b: Secondary | ICD-10-CM | POA: Diagnosis not present

## 2022-10-08 DIAGNOSIS — I129 Hypertensive chronic kidney disease with stage 1 through stage 4 chronic kidney disease, or unspecified chronic kidney disease: Secondary | ICD-10-CM | POA: Diagnosis not present

## 2022-10-08 DIAGNOSIS — R600 Localized edema: Secondary | ICD-10-CM | POA: Diagnosis not present

## 2022-10-08 DIAGNOSIS — N2581 Secondary hyperparathyroidism of renal origin: Secondary | ICD-10-CM | POA: Diagnosis not present

## 2022-10-08 DIAGNOSIS — M1000 Idiopathic gout, unspecified site: Secondary | ICD-10-CM | POA: Diagnosis not present

## 2022-10-08 DIAGNOSIS — I480 Paroxysmal atrial fibrillation: Secondary | ICD-10-CM | POA: Diagnosis not present

## 2022-10-18 DIAGNOSIS — M109 Gout, unspecified: Secondary | ICD-10-CM | POA: Diagnosis not present

## 2022-10-18 DIAGNOSIS — N184 Chronic kidney disease, stage 4 (severe): Secondary | ICD-10-CM | POA: Diagnosis not present

## 2022-10-18 DIAGNOSIS — I878 Other specified disorders of veins: Secondary | ICD-10-CM | POA: Diagnosis not present

## 2022-10-18 DIAGNOSIS — Z79899 Other long term (current) drug therapy: Secondary | ICD-10-CM | POA: Diagnosis not present

## 2022-10-18 DIAGNOSIS — M1A9XX1 Chronic gout, unspecified, with tophus (tophi): Secondary | ICD-10-CM | POA: Diagnosis not present

## 2022-10-18 DIAGNOSIS — M7989 Other specified soft tissue disorders: Secondary | ICD-10-CM | POA: Diagnosis not present

## 2022-11-13 ENCOUNTER — Other Ambulatory Visit: Payer: Self-pay | Admitting: Cardiovascular Disease

## 2022-11-14 ENCOUNTER — Telehealth: Payer: Self-pay | Admitting: Cardiovascular Disease

## 2022-11-14 MED ORDER — METOPROLOL SUCCINATE ER 50 MG PO TB24: 100.0000 mg | ORAL_TABLET | Freq: Two times a day (BID) | ORAL | 0 refills | Status: DC

## 2022-11-14 NOTE — Telephone Encounter (Signed)
Pt's medication was sent to pt's pharmacy as requested. Confirmation received.  °

## 2022-11-14 NOTE — Telephone Encounter (Signed)
*  STAT* If patient is at the pharmacy, call can be transferred to refill team.   1. Which medications need to be refilled? (please list name of each medication and dose if known) metoprolol succinate (TOPROL-XL) 50 MG 24 hr tablet  2. Which pharmacy/location (including street and city if local pharmacy) is medication to be sent to? [facility] - [place], [place] - [address]  3. Do they need a 30 day or 90 day supply?  90 day supply  Patient states he is completely out of medication.

## 2022-12-08 ENCOUNTER — Encounter: Payer: Self-pay | Admitting: Cardiovascular Disease

## 2022-12-08 NOTE — Progress Notes (Signed)
  Cardiology Office Note:  .   Date:  12/10/2022  ID:  Dub Amis Foster, DOB 1945/07/18, MRN [id number] PCP: Joya Martyr, MD  [facility] Providers Cardiologist:  Lance Muss, MD    History of Present Illness: .    Dec. 9, 2024  Harry Foster is a 77 y.o. 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  He is a previous patient of Dr.  Katrinka Blazing and then Dr. Eldridge Dace.  I am meeting for the first time today  Has chronic HFrEF. Has severe TR by echo  Is seen by Dr. Randie Heinz at [facility]  Hx of chronic Afib .  Retired from [employer].   No regular exercise  His legs are very weak   He is still on Digoxin for his atrial fib   BP is being very .   Severe TR       ROS:   Studies Reviewed: .         Risk Assessment/Calculations:    CHA2DS2-VASc Score = 4   This indicates a 4.8% annual risk of stroke. The patient's score is based upon: CHF History: 1 HTN History: 1 Diabetes History: 0 Stroke History: 0 Vascular Disease History: 0 Age Score: 2 Gender Score: 0        Physical Exam:   VS:  BP 98/62   Pulse 72   Ht 5' 11.5" (1.816 m)   Wt 298 lb (135.2 kg)   SpO2 96%   BMI 40.98 kg/m    Wt Readings from Last 3 Encounters:  12/10/22 298 lb (135.2 kg)  09/17/22 (!) 305 lb 9.6 oz (138.6 kg)  03/29/22 (!) 302 lb (137 kg)    GEN: Well nourished, well developed in no acute distress NECK: No JVD; No carotid bruits CARDIAC  :  irreg. Irreg.  Distant heart sounds,  fairnt systolic murmur  RESPIRATORY:  Clear to auscultation without rales, wheezing or rhonchi  ABDOMEN: Soft, non-tender, non-distended EXTREMITIES:  No edema; No deformity   ASSESSMENT AND PLAN: .    Atrial fib :  cont eliquis .   We will continue with anticoagulation and rate control  His creatinine  is 2.3.   will DC digoxin.   Continue metoprolol 100 BID for HR control.   2. CKD  3.   Obesity  4. Severe TR:  has chronic  R heart failure .  Cont medical therapy .  Seems stable        Dispo:   6 months with APP    Signed, Kristeen Miss, MD

## 2022-12-10 ENCOUNTER — Ambulatory Visit: Payer: Medicare Other | Attending: Cardiovascular Disease | Admitting: Cardiovascular Disease

## 2022-12-10 ENCOUNTER — Encounter: Payer: Self-pay | Admitting: Cardiovascular Disease

## 2022-12-10 ENCOUNTER — Other Ambulatory Visit: Payer: Self-pay | Admitting: Interventional Cardiology

## 2022-12-10 DIAGNOSIS — I4819 Other persistent atrial fibrillation: Secondary | ICD-10-CM | POA: Diagnosis not present

## 2022-12-10 MED ORDER — OLMESARTAN MEDOXOMIL 5 MG PO TABS: 5.0000 mg | ORAL_TABLET | Freq: Every day | ORAL | 3 refills | Status: AC

## 2022-12-10 MED ORDER — APIXABAN 5 MG PO TABS: 5.0000 mg | ORAL_TABLET | Freq: Two times a day (BID) | ORAL | 1 refills | Status: DC

## 2022-12-10 NOTE — Patient Instructions (Signed)
Medication Instructions:  Please discontinue your Digoxin. Decrease Olmesartan to 5 mg a day. Continue all other medications as listed.  *If you need a refill on your cardiac medications before your next appointment, please call your pharmacy*   Follow-Up: At [facility], you and your health needs are our priority.  As part of our continuing mission to provide you with exceptional heart care, we have created designated Provider Care Teams.  These Care Teams include your primary Cardiologist (physician) and Advanced Practice Providers (APPs -  Physician Assistants and Nurse Practitioners) who all work together to provide you with the care you need, when you need it.  We recommend signing up for the patient portal called "MyChart".  Sign up information is provided on this After Visit Summary.  MyChart is used to connect with patients for Virtual Visits (Telemedicine).  Patients are able to view lab/test results, encounter notes, upcoming appointments, etc.  Non-urgent messages can be sent to your provider as well.   To learn more about what you can do with MyChart, go to [email or web address].    Your next appointment:   6 month(s)  Provider:   Jari Favre, PA-C, Robin Searing, NP, Jacolyn Reedy, PA-C, Eligha Bridegroom, NP, Tereso Newcomer, PA-C, or Perlie Gold, PA-C

## 2023-01-10 ENCOUNTER — Other Ambulatory Visit: Payer: Self-pay | Admitting: Interventional Cardiology

## 2023-02-18 DIAGNOSIS — M7989 Other specified soft tissue disorders: Secondary | ICD-10-CM | POA: Diagnosis not present

## 2023-02-18 DIAGNOSIS — M1A9XX1 Chronic gout, unspecified, with tophus (tophi): Secondary | ICD-10-CM | POA: Diagnosis not present

## 2023-02-18 DIAGNOSIS — M109 Gout, unspecified: Secondary | ICD-10-CM | POA: Diagnosis not present

## 2023-02-18 DIAGNOSIS — I878 Other specified disorders of veins: Secondary | ICD-10-CM | POA: Diagnosis not present

## 2023-02-18 DIAGNOSIS — N184 Chronic kidney disease, stage 4 (severe): Secondary | ICD-10-CM | POA: Diagnosis not present

## 2023-02-18 DIAGNOSIS — Z79899 Other long term (current) drug therapy: Secondary | ICD-10-CM | POA: Diagnosis not present

## 2023-02-19 ENCOUNTER — Other Ambulatory Visit: Payer: Self-pay | Admitting: Cardiovascular Disease

## 2023-03-19 ENCOUNTER — Other Ambulatory Visit: Payer: Self-pay

## 2023-03-19 ENCOUNTER — Emergency Department (HOSPITAL_BASED_OUTPATIENT_CLINIC_OR_DEPARTMENT_OTHER): Admitting: Radiology

## 2023-03-19 ENCOUNTER — Emergency Department (HOSPITAL_BASED_OUTPATIENT_CLINIC_OR_DEPARTMENT_OTHER)

## 2023-03-19 ENCOUNTER — Emergency Department (HOSPITAL_BASED_OUTPATIENT_CLINIC_OR_DEPARTMENT_OTHER)
Admission: EM | Admit: 2023-03-19 | Discharge: 2023-03-20 | Disposition: A | Discharge status: Home / Self Care | Attending: Emergency Medicine | Admitting: Emergency Medicine

## 2023-03-19 DIAGNOSIS — Z7982 Long term (current) use of aspirin: Secondary | ICD-10-CM | POA: Insufficient documentation

## 2023-03-19 DIAGNOSIS — S0990XA Unspecified injury of head, initial encounter: Secondary | ICD-10-CM

## 2023-03-19 DIAGNOSIS — M19011 Primary osteoarthritis, right shoulder: Secondary | ICD-10-CM | POA: Diagnosis not present

## 2023-03-19 DIAGNOSIS — S43014A Anterior dislocation of right humerus, initial encounter: Secondary | ICD-10-CM | POA: Diagnosis not present

## 2023-03-19 DIAGNOSIS — S43004A Unspecified dislocation of right shoulder joint, initial encounter: Secondary | ICD-10-CM

## 2023-03-19 DIAGNOSIS — Z7901 Long term (current) use of anticoagulants: Secondary | ICD-10-CM | POA: Insufficient documentation

## 2023-03-19 DIAGNOSIS — I700 Atherosclerosis of aorta: Secondary | ICD-10-CM | POA: Diagnosis not present

## 2023-03-19 DIAGNOSIS — W01198A Fall on same level from slipping, tripping and stumbling with subsequent striking against other object, initial encounter: Secondary | ICD-10-CM | POA: Insufficient documentation

## 2023-03-19 DIAGNOSIS — N400 Enlarged prostate without lower urinary tract symptoms: Secondary | ICD-10-CM | POA: Diagnosis not present

## 2023-03-19 DIAGNOSIS — S50311A Abrasion of right elbow, initial encounter: Secondary | ICD-10-CM | POA: Diagnosis not present

## 2023-03-19 DIAGNOSIS — M25511 Pain in right shoulder: Secondary | ICD-10-CM | POA: Diagnosis not present

## 2023-03-19 DIAGNOSIS — K429 Umbilical hernia without obstruction or gangrene: Secondary | ICD-10-CM | POA: Insufficient documentation

## 2023-03-19 DIAGNOSIS — Z79899 Other long term (current) drug therapy: Secondary | ICD-10-CM | POA: Diagnosis not present

## 2023-03-19 DIAGNOSIS — S80212A Abrasion, left knee, initial encounter: Secondary | ICD-10-CM | POA: Insufficient documentation

## 2023-03-19 DIAGNOSIS — S0083XA Contusion of other part of head, initial encounter: Secondary | ICD-10-CM | POA: Diagnosis not present

## 2023-03-19 DIAGNOSIS — I10 Essential (primary) hypertension: Secondary | ICD-10-CM | POA: Insufficient documentation

## 2023-03-19 DIAGNOSIS — K5730 Diverticulosis of large intestine without perforation or abscess without bleeding: Secondary | ICD-10-CM | POA: Insufficient documentation

## 2023-03-19 DIAGNOSIS — K8020 Calculus of gallbladder without cholecystitis without obstruction: Secondary | ICD-10-CM | POA: Insufficient documentation

## 2023-03-19 DIAGNOSIS — W19XXXA Unspecified fall, initial encounter: Secondary | ICD-10-CM

## 2023-03-19 LAB — CBC WITH DIFFERENTIAL/PLATELET
Abs Immature Granulocytes: 0.02 10*3/uL (ref 0.00–0.07)
Basophils Absolute: 0.1 10*3/uL (ref 0.0–0.1)
Basophils Relative: 1 %
Eosinophils Absolute: 0.1 10*3/uL (ref 0.0–0.5)
Eosinophils Relative: 1 %
HCT: 46.1 % (ref 39.0–52.0)
Hemoglobin: 15.3 g/dL (ref 13.0–17.0)
Immature Granulocytes: 0 %
Lymphocytes Relative: 10 %
Lymphs Abs: 0.8 10*3/uL (ref 0.7–4.0)
MCH: 33.2 pg (ref 26.0–34.0)
MCHC: 33.2 g/dL (ref 30.0–36.0)
MCV: 100 fL (ref 80.0–100.0)
Monocytes Absolute: 0.4 10*3/uL (ref 0.1–1.0)
Monocytes Relative: 5 %
Neutro Abs: 7.2 10*3/uL (ref 1.7–7.7)
Neutrophils Relative %: 83 %
Platelets: 160 10*3/uL (ref 150–400)
RBC: 4.61 MIL/uL (ref 4.22–5.81)
RDW: 13.5 % (ref 11.5–15.5)
WBC: 8.6 10*3/uL (ref 4.0–10.5)
nRBC: 0 % (ref 0.0–0.2)

## 2023-03-19 LAB — BASIC METABOLIC PANEL
Anion gap: 7 (ref 5–15)
BUN: 52 mg/dL — ABNORMAL HIGH (ref 8–23)
CO2: 27 mmol/L (ref 22–32)
Calcium: 9.9 mg/dL (ref 8.9–10.3)
Chloride: 103 mmol/L (ref 98–111)
Creatinine, Ser: 2.36 mg/dL — ABNORMAL HIGH (ref 0.61–1.24)
GFR, Estimated: 28 mL/min — ABNORMAL LOW (ref >60)
Glucose, Bld: 130 mg/dL — ABNORMAL HIGH (ref 70–99)
Potassium: 4.1 mmol/L (ref 3.5–5.1)
Sodium: 137 mmol/L (ref 135–145)

## 2023-03-19 MED ORDER — ETOMIDATE 2 MG/ML IV SOLN
10.0000 mg | Freq: Once | INTRAVENOUS | Status: AC
  Administered 2023-03-19: 10 mg via INTRAVENOUS
  Filled 2023-03-19: qty 10

## 2023-03-19 MED ORDER — FENTANYL CITRATE PF 50 MCG/ML IJ SOSY
PREFILLED_SYRINGE | INTRAMUSCULAR | Status: AC
  Filled 2023-03-19: qty 1

## 2023-03-19 MED ORDER — IOHEXOL 300 MG/ML  SOLN: 100.0000 mL | Freq: Once | INTRAMUSCULAR | Status: DC | PRN

## 2023-03-19 MED ORDER — FENTANYL CITRATE PF 50 MCG/ML IJ SOSY
50.0000 ug | PREFILLED_SYRINGE | Freq: Once | INTRAMUSCULAR | Status: AC
  Administered 2023-03-19: 50 ug via INTRAVENOUS

## 2023-03-19 NOTE — Sedation Documentation (Signed)
 Shoulder reduction completed. New XR order placed.

## 2023-03-19 NOTE — ED Notes (Signed)
 RT at bedside for procedural sedation. AMBU bag at bedside. Suction on standby. Patient maintained on cardiac monitor and ETCO2. Patient receiving 2L by [place]. SpO2 98%. ETCO2 34.

## 2023-03-19 NOTE — ED Notes (Signed)
 Patient tolerated procedure well with no further respiratory interventions required.

## 2023-03-19 NOTE — ED Triage Notes (Signed)
 Pt POV with family after mechanical fall while in shed, hit head on piece of equipment in shed, also reporting R shoulder pain. +eliquis.

## 2023-03-19 NOTE — ED Provider Notes (Signed)
 [place] EMERGENCY DEPARTMENT AT [facility] Provider Note   CSN: [id number] Arrival date & time: 03/19/23  2038     History  Chief Complaint  Patient presents with   Marletta Lor    Harry Foster is a 78 y.o. male.  Patient is a 78 year old male with a history of hypertension, paroxysmal atrial fibrillation on Eliquis who presents after a fall.  He states he tripped over [place] on a tractor.  He hit his head.  He denies any loss of conscious.  He is mostly complaining of pain in his right shoulder.  He has some skin abrasions.  He denies any other injuries.  He denies any significant headache.  No nausea or vomiting.  He says his tetanus shot is up-to-date.       Home Medications Prior to Admission medications   Medication Sig Start Date End Date Taking? Authorizing Provider  allopurinol (ZYLOPRIM) 100 MG tablet Take 200 mg by mouth daily.    [provider]  amoxicillin (AMOXIL) 500 MG capsule  02/08/20   [provider]  apixaban (ELIQUIS) 5 MG TABS tablet Take 1 tablet (5 mg total) by mouth 2 (two) times daily. 12/10/22   Nahser, Deloris Ping, MD  ascorbic acid (VITAMIN C) 1000 MG tablet Take 1,000 mg by mouth daily.    [provider]  aspirin 81 MG EC tablet Take 81 mg by mouth daily. Swallow whole.    [provider]  cefdinir (OMNICEF) 300 MG capsule Take 300 mg by mouth 2 (two) times daily. 03/21/22   [provider]  chlorhexidine (PERIDEX) 0.12 % solution SMARTSIG:By Mouth 03/07/20   [provider]  Cholecalciferol (VITAMIN D3) 125 MCG (5000 UT) CAPS Take 1 capsule by mouth daily.    [provider]  Coenzyme Q10 (CO Q 10) 100 MG CAPS Take 100 mg by mouth daily.    [provider]  colchicine 0.6 MG tablet Take 0.6 mg by mouth 2 (two) times a week.    [provider]  COVID-19 mRNA Vac-TriS, Pfizer, (COMIRNATY) SUSP injection Inject into the muscle. 10/16/21     DHEA 50 MG TABS Take 50 mg by  mouth daily.    [provider]  furosemide (LASIX) 40 MG tablet TAKE 3 TABLETS (120 MG TOTAL) BY MOUTH DAILY. 01/11/23   Nahser, Deloris Ping, MD  influenza vaccine adjuvanted (FLUAD QUADRIVALENT) 0.5 ML injection Inject into the muscle. 10/16/21     Lactobacillus (ACIDOPHILUS/BIFIDUS PO) Take 100 mg by mouth daily.    [provider]  Loperamide HCl (IMODIUM PO) Take 1 tablet by mouth daily as needed (loose stools).     [provider]  LORazepam (ATIVAN) 1 MG tablet Take 2 tablets 30 minutes prior to leaving house on day of office surgery. Bring third tablet with you to office on day of office surgery. 04/11/21   Maeola Harman, MD  magnesium gluconate (MAGONATE) 500 MG tablet Take 250 mg by mouth daily.    [provider]  metoprolol succinate (TOPROL-XL) 50 MG 24 hr tablet TAKE 2 TABLETS BY MOUTH 2 TIMES DAILY. TAKE WITH OR IMMEDIATELY FOLLOWING A MEAL. 02/21/23   Nahser, Deloris Ping, MD  Misc Natural Products [facility] ADVANCED) CAPS Take 2 capsules by mouth daily.    [provider]  Multiple Vitamin (MULTIVITAMIN WITH MINERALS) TABS Take 2 tablets by mouth daily.    [provider]  OLIVE LEAF EXTRACT PO Take 1 tablet by mouth daily.  [provider]  olmesartan (BENICAR) 5 MG tablet Take 1 tablet (5 mg total) by mouth daily. 12/10/22   Nahser, Deloris Ping, MD  Omega 3 1000 MG CAPS Take 2,000 mg by mouth.    [provider]  OVER THE COUNTER MEDICATION Take 1 capsule by mouth daily. Berry extract    [provider]  OVER THE COUNTER MEDICATION Take 1 capsule by mouth daily. Vitamin d 5000 units w/ iodine    [provider]  Pomegranate, [place] granatum, (POMEGRANATE PO) Take 250 mg by mouth daily.    [provider]  Saw Palmetto, Serenoa repens, (SAW PALMETTO PO) Take 1,000 mg by mouth daily.    [provider]  triamcinolone (NASACORT) 55 MCG/ACT nasal inhaler Place 1 spray  into both nostrils daily as needed (allergies).    [provider]  UNABLE TO FIND Take 500 mg by mouth 2 (two) times daily. Med Name: terminalia bellerica    [provider]  Vit C-Cholecalciferol-Rose Hip [phone] MG-UNIT-MG CAPS Take 350 mg by mouth daily.    [provider]  VITAMIN E PO Take 360 mg by mouth daily.    [provider]      Allergies    Hydrochlorothiazide, Levaquin [levofloxacin in d5w], Lisinopril, Lisinopril-hydrochlorothiazide, and Quinolones    Review of Systems   Review of Systems  Constitutional:  Negative for activity change, appetite change and fever.  HENT:  Negative for dental problem, nosebleeds and trouble swallowing.   Eyes:  Negative for pain and visual disturbance.  Respiratory:  Negative for shortness of breath.   Cardiovascular:  Negative for chest pain.  Gastrointestinal:  Negative for abdominal pain, nausea and vomiting.  Genitourinary:  Negative for dysuria and hematuria.  Musculoskeletal:  Positive for arthralgias. Negative for back pain, joint swelling and neck pain.  Skin:  Positive for wound.  Neurological:  Negative for weakness, numbness and headaches.  Psychiatric/Behavioral:  Negative for confusion.     Physical Exam Updated Vital Signs BP 117/81   Pulse 77   Temp 97.7 F (36.5 C)   Resp 14   SpO2 99%  Physical Exam Vitals reviewed.  Constitutional:      Appearance: He is well-developed.  HENT:     Head: Normocephalic.     Comments: Hematoma to the right forehead    Nose: Nose normal.  Eyes:     Conjunctiva/sclera: Conjunctivae normal.     Pupils: Pupils are equal, round, and reactive to light.  Neck:     Comments: No pain to the cervical, thoracic, or LS spine.  No step-offs or deformities noted Cardiovascular:     Rate and Rhythm: Normal rate and regular rhythm.     Heart sounds: No murmur heard.    Comments: No evidence of external trauma to the chest or abdomen Pulmonary:      Effort: Pulmonary effort is normal. No respiratory distress.     Breath sounds: Normal breath sounds. No wheezing.     Comments: Positive tenderness in the right posterior ribs and flank area.  No crepitus or deformity. Chest:     Chest wall: Tenderness present.  Abdominal:     General: Bowel sounds are normal. There is no distension.     Palpations: Abdomen is soft.     Tenderness: There is no abdominal tenderness.  Musculoskeletal:        General: Normal range of motion.     Comments: Mild tenderness to palpation of the posterior right  shoulder.  He has multiple abrasions to his extremities including both of his knees and his right elbow.  There are some mild ecchymosis to the right knee.  There is no underlying bony tenderness in any of the extremities other than his right shoulder.  Skin:    General: Skin is warm and dry.     Capillary Refill: Capillary refill takes less than 2 seconds.  Neurological:     General: No focal deficit present.     Mental Status: He is alert and oriented to person, place, and time.     ED Results / Procedures / Treatments   Labs (all labs ordered are listed, but only abnormal results are displayed) Labs Reviewed  BASIC METABOLIC PANEL - Abnormal; Notable for the following components:      Result Value   Glucose, Bld 130 (*)    BUN 52 (*)    Creatinine, Ser 2.36 (*)    GFR, Estimated 28 (*)    All other components within normal limits  CBC WITH DIFFERENTIAL/PLATELET    EKG None  Radiology No results found.  Procedures .Reduction of dislocation  Date/Time: 03/19/2023 11:17 PM  Performed by: Rolan Bucco, MD Authorized by: Rolan Bucco, MD  Consent: Written consent obtained. Risks and benefits: risks, benefits and alternatives were discussed Consent given by: patient Patient understanding: patient states understanding of the procedure being performed Patient consent: the patient's understanding of the procedure matches consent  given Procedure consent: procedure consent matches procedure scheduled Relevant documents: relevant documents present and verified Test results: test results available and properly labeled Site marked: the operative site was not marked Imaging studies: imaging studies available Required items: required blood products, implants, devices, and special equipment available Patient identity confirmed: verbally with patient Time out: Immediately prior to procedure a "time out" was called to verify the correct patient, procedure, equipment, support staff and site/side marked as required. Local anesthesia used: no  Anesthesia: Local anesthesia used: no  Sedation: Patient sedated: yes Sedatives: etomidate Vitals: Vital signs were monitored during sedation.  Patient tolerance: patient tolerated the procedure well with no immediate complications   .Sedation  Date/Time: 03/19/2023 11:18 PM  Performed by: Rolan Bucco, MD Authorized by: Rolan Bucco, MD   Consent:    Consent obtained:  Written   Consent given by:  Patient   Risks discussed:  Vomiting, respiratory compromise necessitating ventilatory assistance and intubation and allergic reaction Universal protocol:    Immediately prior to procedure, a time out was called: yes     Patient identity confirmed:  Verbally with patient Indications:    Procedure performed:  Dislocation reduction   Procedure necessitating sedation performed by:  Physician performing sedation Pre-sedation assessment:    Time since last food or drink:  8 hours   ASA classification: class 3 - patient with severe systemic disease     Mouth opening:  3 or more finger widths   Thyromental distance:  2 finger widths   Mallampati score:  Foster - soft palate, base of uvula visible   Neck mobility: normal     Pre-sedation assessments completed and reviewed: airway patency, cardiovascular function, hydration status, mental status, nausea/vomiting, pain level,  respiratory function and temperature   A pre-sedation assessment was completed prior to the start of the procedure Immediate pre-procedure details:    Reviewed: vital signs and NPO status     Verified: bag valve mask available, emergency equipment available, intubation equipment available, IV patency confirmed and oxygen available   Procedure  details (see MAR for exact dosages):    Sedation:  Etomidate   Intended level of sedation: deep   Analgesia:  Fentanyl   Intra-procedure monitoring:  Blood pressure monitoring, cardiac monitor, continuous capnometry, continuous pulse oximetry, frequent LOC assessments and frequent vital sign checks   Intra-procedure events: none     Total Provider sedation time (minutes):  15 Post-procedure details:   A post-sedation assessment was completed following the completion of the procedure.   Attendance: Constant attendance by certified staff until patient recovered     Recovery: Patient returned to pre-procedure baseline     Post-sedation assessments completed and reviewed: airway patency, cardiovascular function, hydration status, mental status, nausea/vomiting, pain level, respiratory function and temperature     Patient is stable for discharge or admission: yes     Procedure completion:  Tolerated well, no immediate complications     Medications Ordered in ED Medications  fentaNYL (SUBLIMAZE) 50 MCG/ML injection (  Not Given 03/19/23 2205)  iohexol (OMNIPAQUE) 300 MG/ML solution 100 mL (has no administration in time range)  fentaNYL (SUBLIMAZE) injection 50 mcg (50 mcg Intravenous Given 03/19/23 2152)  fentaNYL (SUBLIMAZE) 50 MCG/ML injection (  Given 03/19/23 2237)  etomidate (AMIDATE) injection 10 mg (10 mg Intravenous Given 03/19/23 2254)    ED Course/ Medical Decision Making/ A&P                                 Medical Decision Making Amount and/or Complexity of Data Reviewed Labs: ordered. Radiology: ordered.  Risk Prescription drug  management.   Patient is a 78 year old male who presents after mechanical fall.  He has hematoma to his forehead.  He is on Eliquis.  He has pain in his right shoulder and pain along his right ribs and right flank area.  X-rays of his shoulder were interpreted by me to show a right shoulder dislocation.  Procedural sedation was performed and the shoulder was relocated successfully.  Will do CT of the head, cervical spine, chest abdomen pelvis.  These are pending.  Labs show a creatinine of 2.36.  This seems to be his baseline per chart review.  However unable to give IV contrast.  Will do noncontrast scans.  Dr. Consuella Lose to take over pending imaging.  Final Clinical Impression(s) / ED Diagnoses Final diagnoses:  Fall, initial encounter  Injury of head, initial encounter  Dislocation of right shoulder joint, initial encounter    Rx / DC Orders ED Discharge Orders     None         Rolan Bucco, MD 03/19/23 2321

## 2023-03-20 DIAGNOSIS — R220 Localized swelling, mass and lump, head: Secondary | ICD-10-CM | POA: Diagnosis not present

## 2023-03-20 DIAGNOSIS — E041 Nontoxic single thyroid nodule: Secondary | ICD-10-CM | POA: Diagnosis not present

## 2023-03-20 DIAGNOSIS — K8020 Calculus of gallbladder without cholecystitis without obstruction: Secondary | ICD-10-CM | POA: Diagnosis not present

## 2023-03-20 DIAGNOSIS — K5730 Diverticulosis of large intestine without perforation or abscess without bleeding: Secondary | ICD-10-CM | POA: Diagnosis not present

## 2023-03-20 DIAGNOSIS — M47812 Spondylosis without myelopathy or radiculopathy, cervical region: Secondary | ICD-10-CM | POA: Diagnosis not present

## 2023-03-20 DIAGNOSIS — M4801 Spinal stenosis, occipito-atlanto-axial region: Secondary | ICD-10-CM | POA: Diagnosis not present

## 2023-03-20 DIAGNOSIS — G9389 Other specified disorders of brain: Secondary | ICD-10-CM | POA: Diagnosis not present

## 2023-03-20 DIAGNOSIS — I700 Atherosclerosis of aorta: Secondary | ICD-10-CM | POA: Diagnosis not present

## 2023-03-20 DIAGNOSIS — Z043 Encounter for examination and observation following other accident: Secondary | ICD-10-CM | POA: Diagnosis not present

## 2023-03-20 NOTE — ED Provider Notes (Signed)
 Care assumed from Dr. Fredderick Phenix.  Patient with mechanical fall and head injury on Eliquis.  Did have shoulder dislocation which was reduced.  CT trauma scans are pending.  CT head and C-spine negative for acute traumatic injury.  Patient made aware of thyroid nodule.  CT chest abdomen pelvis negative for traumatic injury.  Patient made aware of prostamegaly.  States he has normal PSAs in the past.  Creatinine is at baseline.  Patient able to ambulate and tolerating p.o.  Supportive care at home, follow-up with PCP, return precautions discussed  BP 112/86   Pulse 77   Temp 98 F (36.7 C) (Oral)   Resp 17   SpO2 99%     Harry Octave, MD 03/20/23 [phone]

## 2023-03-20 NOTE — Discharge Instructions (Signed)
 Testing is negative for serious traumatic injury.  Your shoulder dislocation has been reduced.  You do have a thyroid nodule on CT scan which needs further evaluation by an ultrasound by your primary doctor.  CT also showed a large prostate and should have your PSA followed if it has not been checked recently.  Keep wounds clean and dry.  Return to the ED with new or worsening symptoms.

## 2023-03-20 NOTE — ED Notes (Signed)
 Pt fell after mowing in his shed, bruising noted to rt. Forehead, various abrasions all over.  Pt mainly c/o rt. Shoulder pain, unable to raise or lower him in bed without causing extreme pain.  EDP made aware.  Pt is on a Eliquis Denies LOC pt alert and oriented x 4

## 2023-03-20 NOTE — ED Notes (Signed)
 Pt ambulated in room without assistance. Pt tolerating fluids at this time.

## 2023-03-20 NOTE — ED Notes (Signed)
 RN reviewed discharge instructions with pt. Pt verbalized understanding and had no further questions. VSS upon discharge. Pt taken home by friend.

## 2023-03-20 NOTE — ED Notes (Signed)
 Back from CT, states his shoulder feels much better

## 2023-03-28 DIAGNOSIS — M5030 Other cervical disc degeneration, unspecified cervical region: Secondary | ICD-10-CM | POA: Diagnosis not present

## 2023-03-28 DIAGNOSIS — S43004D Unspecified dislocation of right shoulder joint, subsequent encounter: Secondary | ICD-10-CM | POA: Diagnosis not present

## 2023-03-28 DIAGNOSIS — S43004S Unspecified dislocation of right shoulder joint, sequela: Secondary | ICD-10-CM | POA: Diagnosis not present

## 2023-03-28 DIAGNOSIS — E041 Nontoxic single thyroid nodule: Secondary | ICD-10-CM | POA: Diagnosis not present

## 2023-03-28 DIAGNOSIS — I700 Atherosclerosis of aorta: Secondary | ICD-10-CM | POA: Diagnosis not present

## 2023-03-28 DIAGNOSIS — S0083XD Contusion of other part of head, subsequent encounter: Secondary | ICD-10-CM | POA: Diagnosis not present

## 2023-04-02 DIAGNOSIS — N1832 Chronic kidney disease, stage 3b: Secondary | ICD-10-CM | POA: Diagnosis not present

## 2023-04-09 DIAGNOSIS — R600 Localized edema: Secondary | ICD-10-CM | POA: Diagnosis not present

## 2023-04-09 DIAGNOSIS — M1000 Idiopathic gout, unspecified site: Secondary | ICD-10-CM | POA: Diagnosis not present

## 2023-04-09 DIAGNOSIS — I129 Hypertensive chronic kidney disease with stage 1 through stage 4 chronic kidney disease, or unspecified chronic kidney disease: Secondary | ICD-10-CM | POA: Diagnosis not present

## 2023-04-09 DIAGNOSIS — N1832 Chronic kidney disease, stage 3b: Secondary | ICD-10-CM | POA: Diagnosis not present

## 2023-05-03 DIAGNOSIS — E042 Nontoxic multinodular goiter: Secondary | ICD-10-CM | POA: Diagnosis not present

## 2023-05-03 DIAGNOSIS — E041 Nontoxic single thyroid nodule: Secondary | ICD-10-CM | POA: Diagnosis not present

## 2023-06-18 DIAGNOSIS — Z79899 Other long term (current) drug therapy: Secondary | ICD-10-CM | POA: Diagnosis not present

## 2023-06-18 DIAGNOSIS — M1A9XX1 Chronic gout, unspecified, with tophus (tophi): Secondary | ICD-10-CM | POA: Diagnosis not present

## 2023-06-18 DIAGNOSIS — M7989 Other specified soft tissue disorders: Secondary | ICD-10-CM | POA: Diagnosis not present

## 2023-06-18 DIAGNOSIS — N184 Chronic kidney disease, stage 4 (severe): Secondary | ICD-10-CM | POA: Diagnosis not present

## 2023-06-18 DIAGNOSIS — M109 Gout, unspecified: Secondary | ICD-10-CM | POA: Diagnosis not present

## 2023-06-18 DIAGNOSIS — I878 Other specified disorders of veins: Secondary | ICD-10-CM | POA: Diagnosis not present

## 2023-06-24 ENCOUNTER — Other Ambulatory Visit: Payer: Self-pay | Admitting: Cardiovascular Disease

## 2023-06-24 DIAGNOSIS — I4819 Other persistent atrial fibrillation: Secondary | ICD-10-CM

## 2023-06-25 NOTE — Telephone Encounter (Signed)
 Eliquis  5mg  refill request received. Patient is 78 years old, weight-135.2kg, Crea-2.36 on 03/19/23, Diagnosis-Afib, and last seen by Dr. Alveta on 12/10/22. Dose is appropriate based on dosing criteria. Will send in refill to requested pharmacy.

## 2023-06-27 DIAGNOSIS — I509 Heart failure, unspecified: Secondary | ICD-10-CM | POA: Diagnosis not present

## 2023-06-27 DIAGNOSIS — Z0000 Encounter for general adult medical examination without abnormal findings: Secondary | ICD-10-CM | POA: Diagnosis not present

## 2023-06-27 DIAGNOSIS — D6869 Other thrombophilia: Secondary | ICD-10-CM | POA: Diagnosis not present

## 2023-06-27 DIAGNOSIS — I10 Essential (primary) hypertension: Secondary | ICD-10-CM | POA: Diagnosis not present

## 2023-06-27 DIAGNOSIS — I872 Venous insufficiency (chronic) (peripheral): Secondary | ICD-10-CM | POA: Diagnosis not present

## 2023-06-27 DIAGNOSIS — I719 Aortic aneurysm of unspecified site, without rupture: Secondary | ICD-10-CM | POA: Diagnosis not present

## 2023-06-27 DIAGNOSIS — N1832 Chronic kidney disease, stage 3b: Secondary | ICD-10-CM | POA: Diagnosis not present

## 2023-06-27 DIAGNOSIS — I4821 Permanent atrial fibrillation: Secondary | ICD-10-CM | POA: Diagnosis not present

## 2023-06-27 DIAGNOSIS — E041 Nontoxic single thyroid nodule: Secondary | ICD-10-CM | POA: Diagnosis not present

## 2023-06-27 DIAGNOSIS — M109 Gout, unspecified: Secondary | ICD-10-CM | POA: Diagnosis not present

## 2023-06-27 DIAGNOSIS — R7303 Prediabetes: Secondary | ICD-10-CM | POA: Diagnosis not present

## 2023-06-27 DIAGNOSIS — Z23 Encounter for immunization: Secondary | ICD-10-CM | POA: Diagnosis not present

## 2023-06-27 DIAGNOSIS — M5030 Other cervical disc degeneration, unspecified cervical region: Secondary | ICD-10-CM | POA: Diagnosis not present

## 2023-10-09 ENCOUNTER — Other Ambulatory Visit (HOSPITAL_BASED_OUTPATIENT_CLINIC_OR_DEPARTMENT_OTHER): Payer: Self-pay

## 2023-10-09 DIAGNOSIS — N1832 Chronic kidney disease, stage 3b: Secondary | ICD-10-CM | POA: Diagnosis not present

## 2023-10-09 MED ORDER — FLUZONE HIGH-DOSE 0.5 ML IM SUSY
0.5000 mL | PREFILLED_SYRINGE | Freq: Once | INTRAMUSCULAR | 0 refills | Status: AC
  Filled 2023-10-09: qty 0.5, 1d supply, fill #0

## 2023-10-09 MED ORDER — COMIRNATY 30 MCG/0.3ML IM SUSY
0.3000 mL | PREFILLED_SYRINGE | Freq: Once | INTRAMUSCULAR | 0 refills | Status: AC
Start: 2023-10-09 — End: 2023-10-10
  Filled 2023-10-09: qty 0.3, 1d supply, fill #0

## 2023-10-14 DIAGNOSIS — I480 Paroxysmal atrial fibrillation: Secondary | ICD-10-CM | POA: Diagnosis not present

## 2023-10-14 DIAGNOSIS — N184 Chronic kidney disease, stage 4 (severe): Secondary | ICD-10-CM | POA: Diagnosis not present

## 2023-10-14 DIAGNOSIS — R600 Localized edema: Secondary | ICD-10-CM | POA: Diagnosis not present

## 2023-10-14 DIAGNOSIS — I129 Hypertensive chronic kidney disease with stage 1 through stage 4 chronic kidney disease, or unspecified chronic kidney disease: Secondary | ICD-10-CM | POA: Diagnosis not present

## 2023-10-22 DIAGNOSIS — N184 Chronic kidney disease, stage 4 (severe): Secondary | ICD-10-CM | POA: Diagnosis not present

## 2023-10-22 DIAGNOSIS — Z79899 Other long term (current) drug therapy: Secondary | ICD-10-CM | POA: Diagnosis not present

## 2023-10-22 DIAGNOSIS — M7989 Other specified soft tissue disorders: Secondary | ICD-10-CM | POA: Diagnosis not present

## 2023-10-22 DIAGNOSIS — I878 Other specified disorders of veins: Secondary | ICD-10-CM | POA: Diagnosis not present

## 2023-10-22 DIAGNOSIS — M109 Gout, unspecified: Secondary | ICD-10-CM | POA: Diagnosis not present

## 2023-10-22 DIAGNOSIS — M1A9XX1 Chronic gout, unspecified, with tophus (tophi): Secondary | ICD-10-CM | POA: Diagnosis not present

## 2023-11-01 ENCOUNTER — Telehealth: Payer: Self-pay | Admitting: Cardiology

## 2023-11-01 NOTE — Telephone Encounter (Signed)
 Spoke with pt, he has had about 6 nose bleeds recently. It happens when he is eating or out of the blue. The are usually on the right side and he had laser surgery on that side about 50 years ago. He is taking eliquis  5 mg twice daily and aspirin  81 mg once daily. He is wondering if he can stop the aspirin . He is using humidity in the home and will consider using saline nasal spray. Aware dr kate is not in the office but will forward for his review.

## 2023-11-01 NOTE — Telephone Encounter (Signed)
 Pt c/o medication issue:  1. Name of Medication:   aspirin  81 MG EC tablet  ELIQUIS  5 MG TABS tablet   2. How are you currently taking this medication (dosage and times per day)?   As prescribed  3. Are you having a reaction (difficulty breathing--STAT)?   4. What is your medication issue?    Patient stated he has been having excessive nose bleeds and wants to know if he can stop his Aspirin .

## 2023-11-03 NOTE — Telephone Encounter (Signed)
 Yes recommend stopping aspirin 

## 2023-11-07 NOTE — Telephone Encounter (Signed)
 Pt calling back to f/u on call from 10/31. He states he never received a c/b as to what he needs to do. Please advise

## 2023-11-07 NOTE — Telephone Encounter (Signed)
 Apologized to pt for delay in getting back with him.  Made aware of MD advisement to stop ASA.  Pt will stop and understands to call back if bleeding persists after stopping the ASA.

## 2023-12-06 ENCOUNTER — Other Ambulatory Visit: Payer: Self-pay | Admitting: Cardiology

## 2023-12-06 DIAGNOSIS — I4819 Other persistent atrial fibrillation: Secondary | ICD-10-CM

## 2023-12-09 NOTE — Telephone Encounter (Signed)
 Eliquis  5mg  refill request received. Patient is 78 years old, weight-135.2kg, Crea-2.36 on 03/19/23, Diagnosis-Afib, and last seen by Dr. Alveta on 12/10/22 and has an appt with Dr. Kate on 01/23/23. Dose is appropriate based on dosing criteria. Will send in refill to requested pharmacy.

## 2024-01-20 NOTE — Progress Notes (Unsigned)
 " Cardiology Office Note:    Date:  01/23/2024   ID:  Harry Foster, DOB 1945-04-30, MRN [id number]  PCP:  Ransom Other, MD  Cardiologist:  Candyce Reek, MD  Electrophysiologist:  None   Referring MD: Ransom Other, MD   Chief Complaint  Patient presents with   Atrial Fibrillation    History of Present Illness:    Harry Foster is a 79 y.o. male with a hx of chronic atrial fibrillation, chronic diastolic heart failure, obesity who presents for follow-up.  Previously followed with Dr. Claudene then Dr Reek then Dr Alveta.  Echocardiogram 09/08/2020 showed EF 55 to 60%, RV not well-visualized, severe tricuspid regurgitation, ascending aortic aneurysm measuring 47 mm.  Since last clinic visit, he reports he is doing okay.  Denies any chest pain, dyspnea, lightheadedness, syncope, or palpitations.  Reports BP in 110s when cheks at home. Reports some LE edema.  Uses compression stockings.  Taking Lasix  120 mg daily.  He denies any blood in stool or urine but does bruise easily on Eliquis .  He was having nosebleeds, but has resolved after stopping aspirin .  Past Medical History:  Diagnosis Date   Arthritis    both knees and hands and left elbow, hip   Depression    treated for depression when wife was very ill   Diverticulosis    hx - no sx   Dysrhythmia    hx A fib, on anticoagulants   GI bleed 2010   GI bleed    hx   Hypertension    Recurrent upper respiratory infection (URI)    completing treatment for sinus infection - with abx    Past Surgical History:  Procedure Laterality Date   CARDIOVERSION  02/01/2011   Procedure: CARDIOVERSION;  Surgeon: Victory LELON Claudene DOUGLAS, MD;  Location: [facility] OR;  Service: Cardiovascular;  Laterality: N/A;   ENDOVENOUS ABLATION SAPHENOUS VEIN W/ LASER Left 03/09/2021   endovenous laser ablation left greater saphenous vein by Penne Colorado MD   ENDOVENOUS ABLATION SAPHENOUS VEIN W/ LASER Right 04/20/2021   endovenous laser ablation right  greater saphenous vein By Penne Colorado MD   SINUS EXPLORATION  01/01/1990   laser sinus surgery   TONSILLECTOMY     age 76   TOTAL KNEE ARTHROPLASTY  10/09/2011   Procedure: TOTAL KNEE ARTHROPLASTY;  Surgeon: Maude KANDICE Herald, MD;  Location: [facility] OR;  Service: Orthopedics;  Laterality: Left;  left total knee arthroplasty   TOTAL KNEE ARTHROPLASTY Right 10/06/2013   DR DALLDORF   TOTAL KNEE ARTHROPLASTY WITH REVISION COMPONENTS Right 10/06/2013   Procedure: TOTAL KNEE ARTHROPLASTY WITH REVISION COMPONENTS;  Surgeon: Maude KANDICE Herald, MD;  Location: [facility] OR;  Service: Orthopedics;  Laterality: Right;   TREATMENT FISTULA ANAL  01/01/1990    Current Medications: Active Medications[1]   Allergies:   Hydrochlorothiazide , Levaquin [levofloxacin in d5w], Lisinopril, Lisinopril-hydrochlorothiazide , and Quinolones   Social History   Socioeconomic History   Marital status: Widowed    Spouse name: Not on file   Number of children: Not on file   Years of education: Not on file   Highest education level: Not on file  Occupational History   Not on file  Tobacco Use   Smoking status: Never   Smokeless tobacco: Never  Vaping Use   Vaping status: Never Used  Substance and Sexual Activity   Alcohol use: No   Drug use: No   Sexual activity: Not on file  Other Topics Concern   Not  on file  Social History Narrative   Not on file   Social Drivers of Health   Tobacco Use: Low Risk (01/23/2024)   Patient History    Smoking Tobacco Use: Never    Smokeless Tobacco Use: Never    Passive Exposure: Not on file  Financial Resource Strain: Not on file  Food Insecurity: Not on file  Transportation Needs: Not on file  Physical Activity: Not on file  Stress: Not on file  Social Connections: Not on file  Depression (EYV7-0): Not on file  Alcohol Screen: Not on file  Housing: Not on file  Utilities: Not on file  Health Literacy: Not on file     Family History: The patient's family history  includes Heart disease in his father and mother.  ROS:   Please see the history of present illness.     All other systems reviewed and are negative.  EKGs/Labs/Other Studies Reviewed:    The following studies were reviewed today:   EKG:   01/23/2024: Atrial fibrillation, rate 76  Recent Labs: 03/19/2023: BUN 52; Creatinine, Ser 2.36; Hemoglobin 15.3; Platelets 160; Potassium 4.1; Sodium 137  Recent Lipid Panel No results found for: CHOL, TRIG, HDL, CHOLHDL, VLDL, LDLCALC, LDLDIRECT  Physical Exam:    VS:  BP 122/76   Pulse 70   Ht 5' 11 (1.803 m)   Wt 294 lb 3.2 oz (133.4 kg)   SpO2 98%   BMI 41.03 kg/m     Wt Readings from Last 3 Encounters:  01/23/24 294 lb 3.2 oz (133.4 kg)  12/10/22 298 lb (135.2 kg)  09/17/22 (!) 305 lb 9.6 oz (138.6 kg)     GEN:  in no acute distress HEENT: Normal NECK: No JVD; No carotid bruits CARDIAC: Irregular, normal rate, no murmurs, rubs, gallops RESPIRATORY:  Clear to auscultation without rales, wheezing or rhonchi  ABDOMEN: Soft, non-tender, non-distended MUSCULOSKELETAL: Trace edema SKIN: Warm and dry NEUROLOGIC:  Alert and oriented x 3 PSYCHIATRIC:  Normal affect   ASSESSMENT:    1. Permanent atrial fibrillation (HCC)   2. Tricuspid valve insufficiency, unspecified etiology   3. Hyperlipidemia, unspecified hyperlipidemia type   4. Chronic diastolic heart failure (HCC)   5. Essential hypertension   6. Morbid obesity (HCC)   7. Chronic kidney disease (CKD), stage IV (severe) (HCC)    PLAN:    Chronic atrial fibrillation: Appears rate controlled, continue Toprol -XL 100 mg twice daily.  On Eliquis  5 mg twice daily.  Recommend screening for OSA but he declines  Chronic diastolic heart failure:  Echocardiogram 09/08/2020 showed EF 55 to 60%, RV not well-visualized, severe tricuspid regurgitation, ascending aortic aneurysm measuring 47 mm. On Lasix  120 mg daily.  Appears euvolemic  Tricuspid regurgitation: Severe on  echocardiogram in 2022.  Update echocardiogram  Ascending aortic aneurysm: Measured 47 mm on echocardiogram in 2022.  Update echocardiogram  Hypertension: On olmesartan  5 mg daily and Toprol -XL 100 mg twice daily.  Appears controlled  Obesity:Body mass index is 41.03 kg/m. Diet/exercise encouraged.  Suggested referral to healthy weight and wellness but he declines  CKD stage IV: Creatinine 2.4 on 10/2023.  Follows with nephrology.   Hyperlipidemia: LDL 79 on 06/27/2023.  Coronary calcifications noted on CT chest.  Start atorvastatin  10 mg daily.  Check fasting lipid panel in 3 months  RTC in 6 months  Medication Adjustments/Labs and Tests Ordered: Current medicines are reviewed at length with the patient today.  Concerns regarding medicines are outlined above.  Orders Placed This Encounter  Procedures   Lipid panel   EKG 12-Lead   ECHOCARDIOGRAM COMPLETE   Meds ordered this encounter  Medications   atorvastatin  (LIPITOR) 10 MG tablet    Sig: Take 1 tablet (10 mg total) by mouth daily.    Dispense:  90 tablet    Refill:  3    Patient Instructions  Medication Instructions:  Start Atorvastatin  10 mg daily *If you need a refill on your cardiac medications before your next appointment, please call your pharmacy*  Lab Work: Lipid panel in 3 months If you have labs (blood work) drawn today and your tests are completely normal, you will receive your results only by: MyChart Message (if you have MyChart) OR A paper copy in the mail If you have any lab test that is abnormal or we need to change your treatment, we will call you to review the results.  Testing/Procedures: Echo  Your physician has requested that you have an echocardiogram. Echocardiography is a painless test that uses sound waves to create images of your heart. It provides your doctor with information about the size and shape of your heart and how well your hearts chambers and valves are working. This procedure takes  approximately one hour. There are no restrictions for this procedure. Please do NOT wear cologne, perfume, aftershave, or lotions (deodorant is allowed). Please arrive 15 minutes prior to your appointment time.  Please note: We ask at that you not bring children with you during ultrasound (echo/ vascular) testing. Due to room size and safety concerns, children are not allowed in the ultrasound rooms during exams. Our front office staff cannot provide observation of children in our lobby area while testing is being conducted. An adult accompanying a patient to their appointment will only be allowed in the ultrasound room at the discretion of the ultrasound technician under special circumstances. We apologize for any inconvenience.   Follow-Up: At [facility], you and your health needs are our priority.  As part of our continuing mission to provide you with exceptional heart care, our providers are all part of one team.  This team includes your primary Cardiologist (physician) and Advanced Practice Providers or APPs (Physician Assistants and Nurse Practitioners) who all work together to provide you with the care you need, when you need it.  Your next appointment:   6 months  Provider:   Dr. Kate  We recommend signing up for the patient portal called MyChart.  Sign up information is provided on this After Visit Summary.  MyChart is used to connect with patients for Virtual Visits (Telemedicine).  Patients are able to view lab/test results, encounter notes, upcoming appointments, etc.  Non-urgent messages can be sent to your provider as well.   To learn more about what you can do with MyChart, go to [email or web address].   Other Instructions None            Signed, Lonni LITTIE Kate, MD  01/23/2024 5:00 PM    [facility]     [1]  Current Meds  Medication Sig   allopurinol (ZYLOPRIM) 100 MG tablet Take 200 mg by mouth daily.   ascorbic  acid (VITAMIN C ) 1000 MG tablet Take 1,000 mg by mouth daily.   atorvastatin  (LIPITOR) 10 MG tablet Take 1 tablet (10 mg total) by mouth daily.   Cholecalciferol (VITAMIN D3) 125 MCG (5000 UT) CAPS Take 1 capsule by mouth daily.   Coenzyme Q10 (CO Q 10) 100 MG CAPS Take 100 mg by  mouth daily.   colchicine 0.6 MG tablet Take 0.6 mg by mouth 2 (two) times a week.   DHEA 50 MG TABS Take 50 mg by mouth daily.   ELIQUIS  5 MG TABS tablet TAKE 1 TABLET BY MOUTH 2 TIMES DAILY.   furosemide  (LASIX ) 40 MG tablet TAKE 3 TABLETS (120 MG TOTAL) BY MOUTH DAILY.   Lactobacillus (ACIDOPHILUS/BIFIDUS PO) Take 100 mg by mouth daily.   Loperamide HCl (IMODIUM PO) Take 1 tablet by mouth daily as needed (loose stools).    magnesium  gluconate (MAGONATE) 500 MG tablet Take 250 mg by mouth daily.   metoprolol  succinate (TOPROL -XL) 50 MG 24 hr tablet TAKE 2 TABLETS BY MOUTH 2 TIMES DAILY. TAKE WITH OR IMMEDIATELY FOLLOWING A MEAL.   Misc Natural Products (TART CHERRY ADVANCED) CAPS Take 2 capsules by mouth daily.   Multiple Vitamin (MULTIVITAMIN WITH MINERALS) TABS Take 2 tablets by mouth daily.   OLIVE LEAF EXTRACT PO Take 1 tablet by mouth daily.   olmesartan  (BENICAR ) 5 MG tablet Take 1 tablet (5 mg total) by mouth daily.   Omega 3 1000 MG CAPS Take 2,000 mg by mouth.   OVER THE COUNTER MEDICATION Take 1 capsule by mouth daily. Berry extract   OVER THE COUNTER MEDICATION Take 1 capsule by mouth daily. Vitamin d 5000 units w/ iodine 1000mcg   Pomegranate, Punica granatum, (POMEGRANATE PO) Take 250 mg by mouth daily.   Saw Palmetto, Serenoa repens, (SAW PALMETTO PO) Take 1,000 mg by mouth daily.   triamcinolone (NASACORT) 55 MCG/ACT nasal inhaler Place 1 spray into both nostrils daily as needed (allergies).   Vit C-Cholecalciferol-Rose Hip [phone] MG-UNIT-MG CAPS Take 350 mg by mouth daily.   VITAMIN E PO Take 360 mg by mouth daily.   "

## 2024-01-23 ENCOUNTER — Ambulatory Visit: Admitting: Cardiology

## 2024-01-23 ENCOUNTER — Encounter: Payer: Self-pay | Admitting: Cardiology

## 2024-01-23 VITALS — BP 122/76 | HR 70 | Ht 71.0 in | Wt 294.2 lb

## 2024-01-23 DIAGNOSIS — I5032 Chronic diastolic (congestive) heart failure: Secondary | ICD-10-CM | POA: Diagnosis not present

## 2024-01-23 DIAGNOSIS — E785 Hyperlipidemia, unspecified: Secondary | ICD-10-CM | POA: Diagnosis not present

## 2024-01-23 DIAGNOSIS — N184 Chronic kidney disease, stage 4 (severe): Secondary | ICD-10-CM | POA: Diagnosis not present

## 2024-01-23 DIAGNOSIS — I071 Rheumatic tricuspid insufficiency: Secondary | ICD-10-CM

## 2024-01-23 DIAGNOSIS — I10 Essential (primary) hypertension: Secondary | ICD-10-CM | POA: Diagnosis not present

## 2024-01-23 DIAGNOSIS — I4821 Permanent atrial fibrillation: Secondary | ICD-10-CM | POA: Diagnosis not present

## 2024-01-23 DIAGNOSIS — I361 Nonrheumatic tricuspid (valve) insufficiency: Secondary | ICD-10-CM

## 2024-01-23 MED ORDER — ATORVASTATIN CALCIUM 10 MG PO TABS: 10.0000 mg | ORAL_TABLET | Freq: Every day | ORAL | 3 refills | Status: AC

## 2024-01-23 NOTE — Patient Instructions (Signed)
 Medication Instructions:  Start Atorvastatin  10 mg daily *If you need a refill on your cardiac medications before your next appointment, please call your pharmacy*  Lab Work: Lipid panel in 3 months If you have labs (blood work) drawn today and your tests are completely normal, you will receive your results only by: MyChart Message (if you have MyChart) OR A paper copy in the mail If you have any lab test that is abnormal or we need to change your treatment, we will call you to review the results.  Testing/Procedures: Echo  Your physician has requested that you have an echocardiogram. Echocardiography is a painless test that uses sound waves to create images of your heart. It provides your doctor with information about the size and shape of your heart and how well your hearts chambers and valves are working. This procedure takes approximately one hour. There are no restrictions for this procedure. Please do NOT wear cologne, perfume, aftershave, or lotions (deodorant is allowed). Please arrive 15 minutes prior to your appointment time.  Please note: We ask at that you not bring children with you during ultrasound (echo/ vascular) testing. Due to room size and safety concerns, children are not allowed in the ultrasound rooms during exams. Our front office staff cannot provide observation of children in our lobby area while testing is being conducted. An adult accompanying a patient to their appointment will only be allowed in the ultrasound room at the discretion of the ultrasound technician under special circumstances. We apologize for any inconvenience.   Follow-Up: At [facility], you and your health needs are our priority.  As part of our continuing mission to provide you with exceptional heart care, our providers are all part of one team.  This team includes your primary Cardiologist (physician) and Advanced Practice Providers or APPs (Physician Assistants and Nurse Practitioners)  who all work together to provide you with the care you need, when you need it.  Your next appointment:   6 months  Provider:   Dr. Kate  We recommend signing up for the patient portal called MyChart.  Sign up information is provided on this After Visit Summary.  MyChart is used to connect with patients for Virtual Visits (Telemedicine).  Patients are able to view lab/test results, encounter notes, upcoming appointments, etc.  Non-urgent messages can be sent to your provider as well.   To learn more about what you can do with MyChart, go to [email or web address].   Other Instructions None

## 2024-02-26 ENCOUNTER — Ambulatory Visit (HOSPITAL_COMMUNITY)
